# Patient Record
Sex: Male | Born: 1935
Health system: Southern US, Community
[De-identification: ages and names within clinical notes are randomized; demographics above are authoritative.]

## PROBLEM LIST (undated history)

## (undated) DIAGNOSIS — N400 Enlarged prostate without lower urinary tract symptoms: Secondary | ICD-10-CM

## (undated) DIAGNOSIS — I700 Atherosclerosis of aorta: Secondary | ICD-10-CM

## (undated) DIAGNOSIS — F329 Major depressive disorder, single episode, unspecified: Secondary | ICD-10-CM

## (undated) DIAGNOSIS — F411 Generalized anxiety disorder: Secondary | ICD-10-CM

## (undated) DIAGNOSIS — F32A Depression, unspecified: Secondary | ICD-10-CM

## (undated) DIAGNOSIS — J479 Bronchiectasis, uncomplicated: Secondary | ICD-10-CM

## (undated) HISTORY — PX: APPENDECTOMY: SHX54

## (undated) HISTORY — DX: Atherosclerosis of aorta: I70.0

## (undated) HISTORY — DX: Bronchiectasis, uncomplicated: J47.9

## (undated) HISTORY — DX: Generalized anxiety disorder: F41.1

## (undated) HISTORY — DX: Benign prostatic hyperplasia without lower urinary tract symptoms: N40.0

## (undated) HISTORY — PX: HERNIA REPAIR: SHX51

---

## 2009-11-22 ENCOUNTER — Encounter: Admission: RE | Admit: 2009-11-22 | Discharge: 2009-11-22 | Payer: Self-pay | Admitting: Internal Medicine

## 2011-07-16 DIAGNOSIS — F339 Major depressive disorder, recurrent, unspecified: Secondary | ICD-10-CM | POA: Diagnosis not present

## 2011-08-15 DIAGNOSIS — F339 Major depressive disorder, recurrent, unspecified: Secondary | ICD-10-CM | POA: Diagnosis not present

## 2011-08-28 DIAGNOSIS — F339 Major depressive disorder, recurrent, unspecified: Secondary | ICD-10-CM | POA: Diagnosis not present

## 2011-09-13 DIAGNOSIS — F339 Major depressive disorder, recurrent, unspecified: Secondary | ICD-10-CM | POA: Diagnosis not present

## 2011-10-04 DIAGNOSIS — M999 Biomechanical lesion, unspecified: Secondary | ICD-10-CM | POA: Diagnosis not present

## 2011-10-04 DIAGNOSIS — M5137 Other intervertebral disc degeneration, lumbosacral region: Secondary | ICD-10-CM | POA: Diagnosis not present

## 2011-10-08 DIAGNOSIS — M999 Biomechanical lesion, unspecified: Secondary | ICD-10-CM | POA: Diagnosis not present

## 2011-10-08 DIAGNOSIS — M5137 Other intervertebral disc degeneration, lumbosacral region: Secondary | ICD-10-CM | POA: Diagnosis not present

## 2011-10-10 DIAGNOSIS — F339 Major depressive disorder, recurrent, unspecified: Secondary | ICD-10-CM | POA: Diagnosis not present

## 2011-10-11 DIAGNOSIS — M999 Biomechanical lesion, unspecified: Secondary | ICD-10-CM | POA: Diagnosis not present

## 2011-10-11 DIAGNOSIS — M5137 Other intervertebral disc degeneration, lumbosacral region: Secondary | ICD-10-CM | POA: Diagnosis not present

## 2011-10-15 DIAGNOSIS — M5137 Other intervertebral disc degeneration, lumbosacral region: Secondary | ICD-10-CM | POA: Diagnosis not present

## 2011-10-15 DIAGNOSIS — M999 Biomechanical lesion, unspecified: Secondary | ICD-10-CM | POA: Diagnosis not present

## 2011-10-18 DIAGNOSIS — M999 Biomechanical lesion, unspecified: Secondary | ICD-10-CM | POA: Diagnosis not present

## 2011-10-18 DIAGNOSIS — M5137 Other intervertebral disc degeneration, lumbosacral region: Secondary | ICD-10-CM | POA: Diagnosis not present

## 2011-10-19 DIAGNOSIS — Z125 Encounter for screening for malignant neoplasm of prostate: Secondary | ICD-10-CM | POA: Diagnosis not present

## 2011-10-19 DIAGNOSIS — M810 Age-related osteoporosis without current pathological fracture: Secondary | ICD-10-CM | POA: Diagnosis not present

## 2011-10-19 DIAGNOSIS — Z79899 Other long term (current) drug therapy: Secondary | ICD-10-CM | POA: Diagnosis not present

## 2011-10-22 DIAGNOSIS — M5137 Other intervertebral disc degeneration, lumbosacral region: Secondary | ICD-10-CM | POA: Diagnosis not present

## 2011-10-22 DIAGNOSIS — M999 Biomechanical lesion, unspecified: Secondary | ICD-10-CM | POA: Diagnosis not present

## 2011-10-25 DIAGNOSIS — M999 Biomechanical lesion, unspecified: Secondary | ICD-10-CM | POA: Diagnosis not present

## 2011-10-25 DIAGNOSIS — M5137 Other intervertebral disc degeneration, lumbosacral region: Secondary | ICD-10-CM | POA: Diagnosis not present

## 2011-10-26 DIAGNOSIS — Z79899 Other long term (current) drug therapy: Secondary | ICD-10-CM | POA: Diagnosis not present

## 2011-10-26 DIAGNOSIS — M810 Age-related osteoporosis without current pathological fracture: Secondary | ICD-10-CM | POA: Diagnosis not present

## 2011-10-26 DIAGNOSIS — R21 Rash and other nonspecific skin eruption: Secondary | ICD-10-CM | POA: Diagnosis not present

## 2011-10-26 DIAGNOSIS — E785 Hyperlipidemia, unspecified: Secondary | ICD-10-CM | POA: Diagnosis not present

## 2011-10-30 DIAGNOSIS — M999 Biomechanical lesion, unspecified: Secondary | ICD-10-CM | POA: Diagnosis not present

## 2011-10-30 DIAGNOSIS — M5137 Other intervertebral disc degeneration, lumbosacral region: Secondary | ICD-10-CM | POA: Diagnosis not present

## 2011-11-01 DIAGNOSIS — IMO0002 Reserved for concepts with insufficient information to code with codable children: Secondary | ICD-10-CM | POA: Diagnosis not present

## 2011-11-05 DIAGNOSIS — R972 Elevated prostate specific antigen [PSA]: Secondary | ICD-10-CM | POA: Diagnosis not present

## 2011-11-07 DIAGNOSIS — M999 Biomechanical lesion, unspecified: Secondary | ICD-10-CM | POA: Diagnosis not present

## 2011-11-07 DIAGNOSIS — M5137 Other intervertebral disc degeneration, lumbosacral region: Secondary | ICD-10-CM | POA: Diagnosis not present

## 2011-11-12 DIAGNOSIS — M5137 Other intervertebral disc degeneration, lumbosacral region: Secondary | ICD-10-CM | POA: Diagnosis not present

## 2011-11-12 DIAGNOSIS — M999 Biomechanical lesion, unspecified: Secondary | ICD-10-CM | POA: Diagnosis not present

## 2011-11-13 DIAGNOSIS — F339 Major depressive disorder, recurrent, unspecified: Secondary | ICD-10-CM | POA: Diagnosis not present

## 2011-11-19 DIAGNOSIS — M999 Biomechanical lesion, unspecified: Secondary | ICD-10-CM | POA: Diagnosis not present

## 2011-11-19 DIAGNOSIS — M5137 Other intervertebral disc degeneration, lumbosacral region: Secondary | ICD-10-CM | POA: Diagnosis not present

## 2011-11-23 DIAGNOSIS — M810 Age-related osteoporosis without current pathological fracture: Secondary | ICD-10-CM | POA: Diagnosis not present

## 2011-12-11 DIAGNOSIS — F339 Major depressive disorder, recurrent, unspecified: Secondary | ICD-10-CM | POA: Diagnosis not present

## 2011-12-19 DIAGNOSIS — M999 Biomechanical lesion, unspecified: Secondary | ICD-10-CM | POA: Diagnosis not present

## 2011-12-19 DIAGNOSIS — M5137 Other intervertebral disc degeneration, lumbosacral region: Secondary | ICD-10-CM | POA: Diagnosis not present

## 2012-01-16 DIAGNOSIS — F339 Major depressive disorder, recurrent, unspecified: Secondary | ICD-10-CM | POA: Diagnosis not present

## 2012-01-16 DIAGNOSIS — M999 Biomechanical lesion, unspecified: Secondary | ICD-10-CM | POA: Diagnosis not present

## 2012-01-16 DIAGNOSIS — M5137 Other intervertebral disc degeneration, lumbosacral region: Secondary | ICD-10-CM | POA: Diagnosis not present

## 2012-01-23 DIAGNOSIS — M5137 Other intervertebral disc degeneration, lumbosacral region: Secondary | ICD-10-CM | POA: Diagnosis not present

## 2012-01-23 DIAGNOSIS — M999 Biomechanical lesion, unspecified: Secondary | ICD-10-CM | POA: Diagnosis not present

## 2012-02-11 DIAGNOSIS — M999 Biomechanical lesion, unspecified: Secondary | ICD-10-CM | POA: Diagnosis not present

## 2012-02-11 DIAGNOSIS — M5137 Other intervertebral disc degeneration, lumbosacral region: Secondary | ICD-10-CM | POA: Diagnosis not present

## 2012-02-13 DIAGNOSIS — F339 Major depressive disorder, recurrent, unspecified: Secondary | ICD-10-CM | POA: Diagnosis not present

## 2012-03-12 DIAGNOSIS — M5137 Other intervertebral disc degeneration, lumbosacral region: Secondary | ICD-10-CM | POA: Diagnosis not present

## 2012-03-12 DIAGNOSIS — M999 Biomechanical lesion, unspecified: Secondary | ICD-10-CM | POA: Diagnosis not present

## 2012-03-26 DIAGNOSIS — F339 Major depressive disorder, recurrent, unspecified: Secondary | ICD-10-CM | POA: Diagnosis not present

## 2012-04-09 DIAGNOSIS — M999 Biomechanical lesion, unspecified: Secondary | ICD-10-CM | POA: Diagnosis not present

## 2012-04-09 DIAGNOSIS — M5137 Other intervertebral disc degeneration, lumbosacral region: Secondary | ICD-10-CM | POA: Diagnosis not present

## 2012-04-25 DIAGNOSIS — F339 Major depressive disorder, recurrent, unspecified: Secondary | ICD-10-CM | POA: Diagnosis not present

## 2012-05-07 DIAGNOSIS — M999 Biomechanical lesion, unspecified: Secondary | ICD-10-CM | POA: Diagnosis not present

## 2012-05-07 DIAGNOSIS — M5137 Other intervertebral disc degeneration, lumbosacral region: Secondary | ICD-10-CM | POA: Diagnosis not present

## 2012-06-02 DIAGNOSIS — F339 Major depressive disorder, recurrent, unspecified: Secondary | ICD-10-CM | POA: Diagnosis not present

## 2012-06-02 DIAGNOSIS — M999 Biomechanical lesion, unspecified: Secondary | ICD-10-CM | POA: Diagnosis not present

## 2012-06-02 DIAGNOSIS — M5137 Other intervertebral disc degeneration, lumbosacral region: Secondary | ICD-10-CM | POA: Diagnosis not present

## 2012-07-04 DIAGNOSIS — F339 Major depressive disorder, recurrent, unspecified: Secondary | ICD-10-CM | POA: Diagnosis not present

## 2012-07-30 DIAGNOSIS — F339 Major depressive disorder, recurrent, unspecified: Secondary | ICD-10-CM | POA: Diagnosis not present

## 2012-08-01 DIAGNOSIS — F339 Major depressive disorder, recurrent, unspecified: Secondary | ICD-10-CM | POA: Diagnosis not present

## 2012-08-04 DIAGNOSIS — H3560 Retinal hemorrhage, unspecified eye: Secondary | ICD-10-CM | POA: Diagnosis not present

## 2012-09-05 DIAGNOSIS — F339 Major depressive disorder, recurrent, unspecified: Secondary | ICD-10-CM | POA: Diagnosis not present

## 2012-10-10 DIAGNOSIS — F339 Major depressive disorder, recurrent, unspecified: Secondary | ICD-10-CM | POA: Diagnosis not present

## 2012-10-28 DIAGNOSIS — E785 Hyperlipidemia, unspecified: Secondary | ICD-10-CM | POA: Diagnosis not present

## 2012-10-28 DIAGNOSIS — Z125 Encounter for screening for malignant neoplasm of prostate: Secondary | ICD-10-CM | POA: Diagnosis not present

## 2012-10-28 DIAGNOSIS — M810 Age-related osteoporosis without current pathological fracture: Secondary | ICD-10-CM | POA: Diagnosis not present

## 2012-11-01 DIAGNOSIS — IMO0002 Reserved for concepts with insufficient information to code with codable children: Secondary | ICD-10-CM | POA: Diagnosis not present

## 2012-11-03 DIAGNOSIS — R972 Elevated prostate specific antigen [PSA]: Secondary | ICD-10-CM | POA: Diagnosis not present

## 2012-11-03 DIAGNOSIS — N401 Enlarged prostate with lower urinary tract symptoms: Secondary | ICD-10-CM | POA: Diagnosis not present

## 2012-11-14 DIAGNOSIS — F339 Major depressive disorder, recurrent, unspecified: Secondary | ICD-10-CM | POA: Diagnosis not present

## 2012-11-20 DIAGNOSIS — H6120 Impacted cerumen, unspecified ear: Secondary | ICD-10-CM | POA: Diagnosis not present

## 2012-11-20 DIAGNOSIS — F329 Major depressive disorder, single episode, unspecified: Secondary | ICD-10-CM | POA: Diagnosis not present

## 2012-11-20 DIAGNOSIS — M810 Age-related osteoporosis without current pathological fracture: Secondary | ICD-10-CM | POA: Diagnosis not present

## 2012-11-20 DIAGNOSIS — R972 Elevated prostate specific antigen [PSA]: Secondary | ICD-10-CM | POA: Diagnosis not present

## 2012-12-19 DIAGNOSIS — F339 Major depressive disorder, recurrent, unspecified: Secondary | ICD-10-CM | POA: Diagnosis not present

## 2013-01-23 DIAGNOSIS — F339 Major depressive disorder, recurrent, unspecified: Secondary | ICD-10-CM | POA: Diagnosis not present

## 2013-02-18 DIAGNOSIS — F339 Major depressive disorder, recurrent, unspecified: Secondary | ICD-10-CM | POA: Diagnosis not present

## 2013-02-27 DIAGNOSIS — F339 Major depressive disorder, recurrent, unspecified: Secondary | ICD-10-CM | POA: Diagnosis not present

## 2013-04-03 DIAGNOSIS — F339 Major depressive disorder, recurrent, unspecified: Secondary | ICD-10-CM | POA: Diagnosis not present

## 2013-05-08 DIAGNOSIS — F339 Major depressive disorder, recurrent, unspecified: Secondary | ICD-10-CM | POA: Diagnosis not present

## 2013-05-13 ENCOUNTER — Encounter (HOSPITAL_COMMUNITY): Payer: Self-pay | Admitting: Emergency Medicine

## 2013-05-13 ENCOUNTER — Emergency Department (HOSPITAL_COMMUNITY)
Admission: EM | Admit: 2013-05-13 | Discharge: 2013-05-13 | Disposition: A | Discharge status: Home / Self Care | Payer: Medicare Other | Attending: Emergency Medicine | Admitting: Emergency Medicine

## 2013-05-13 ENCOUNTER — Emergency Department (HOSPITAL_COMMUNITY): Payer: Medicare Other

## 2013-05-13 DIAGNOSIS — Z8659 Personal history of other mental and behavioral disorders: Secondary | ICD-10-CM | POA: Diagnosis not present

## 2013-05-13 DIAGNOSIS — R0789 Other chest pain: Secondary | ICD-10-CM | POA: Diagnosis not present

## 2013-05-13 DIAGNOSIS — R911 Solitary pulmonary nodule: Secondary | ICD-10-CM | POA: Diagnosis not present

## 2013-05-13 DIAGNOSIS — R0602 Shortness of breath: Secondary | ICD-10-CM | POA: Diagnosis not present

## 2013-05-13 DIAGNOSIS — F339 Major depressive disorder, recurrent, unspecified: Secondary | ICD-10-CM | POA: Diagnosis not present

## 2013-05-13 HISTORY — DX: Major depressive disorder, single episode, unspecified: F32.9

## 2013-05-13 HISTORY — DX: Depression, unspecified: F32.A

## 2013-05-13 LAB — BASIC METABOLIC PANEL
BUN: 10 mg/dL (ref 6–23)
CO2: 29 mEq/L (ref 19–32)
Chloride: 102 mEq/L (ref 96–112)
GFR calc non Af Amer: 80 mL/min — ABNORMAL LOW (ref >90)
Glucose, Bld: 99 mg/dL (ref 70–99)
Potassium: 3.9 mEq/L (ref 3.5–5.1)
Sodium: 138 mEq/L (ref 135–145)

## 2013-05-13 LAB — CBC
HCT: 36.4 % — ABNORMAL LOW (ref 39.0–52.0)
MCHC: 34.1 g/dL (ref 30.0–36.0)
Platelets: 232 10*3/uL (ref 150–400)
RBC: 3.9 MIL/uL — ABNORMAL LOW (ref 4.22–5.81)
RDW: 12.6 % (ref 11.5–15.5)

## 2013-05-13 LAB — D-DIMER, QUANTITATIVE: D-Dimer, Quant: 0.27 ug/mL-FEU (ref 0.00–0.48)

## 2013-05-13 LAB — POCT I-STAT TROPONIN I: Troponin i, poc: 0.03 ng/mL (ref 0.00–0.08)

## 2013-05-13 MED ORDER — SODIUM CHLORIDE 0.9 % IV BOLUS (SEPSIS): 1000.0000 mL | Freq: Once | INTRAVENOUS | Status: DC

## 2013-05-13 MED ORDER — NITROGLYCERIN 0.4 MG SL SUBL
0.4000 mg | SUBLINGUAL_TABLET | SUBLINGUAL | Status: DC | PRN
  Administered 2013-05-13: 0.4 mg via SUBLINGUAL
  Filled 2013-05-13: qty 25

## 2013-05-13 MED ORDER — ASPIRIN 81 MG PO CHEW: 324.0000 mg | CHEWABLE_TABLET | Freq: Once | ORAL | Status: DC

## 2013-05-13 NOTE — ED Notes (Signed)
ASA not given per patient states that it bothers his stomach

## 2013-05-13 NOTE — ED Notes (Signed)
Presents with 2-3 weeks of intermittent bouts of SOB associated with chest tightness. Denies nausea, vomiting, dizziness and weakness. Chest tightness rated 1/10.

## 2013-05-13 NOTE — ED Provider Notes (Signed)
CSN: [id number]     Arrival date & time 05/13/13  1623 History   First MD Initiated Contact with Patient 05/13/13 1633     Chief Complaint  Patient presents with  . Chest Pain   (Consider location/radiation/quality/duration/timing/severity/associated sxs/prior Treatment) HPI Comments: 77 year old male presents with 2 weeks of intermittent shortness of breath and chest tightness. He states that there is nothing that seems to make it come and go. He notices it more at rest but he does with exertion. He states it is 145 pushups today and noticed a little bit but not to be worse with more activity. He normally runs as well and states he has not had any decreased exercise tolerance. He never had symptoms like this before. He followed with his PCP who stated that he hasn't irregular pulse on exam and they were concerned that he might "get a blood clot". He denies having any known DVTs or PEs as stated that 10 years ago he did have to give himself shots for swelling in his ankle on the right side. Is not sure of this was temporary Lovenox or whether or not he had a blood clot.   Past Medical History  Diagnosis Date  . Depression    History reviewed. No pertinent past surgical history. History reviewed. No pertinent family history. History  Substance Use Topics  . Smoking status: Never Smoker   . Smokeless tobacco: Never Used  . Alcohol Use: No    Review of Systems  Constitutional: Negative for fever and fatigue.  Respiratory: Positive for shortness of breath. Negative for cough.   Cardiovascular: Positive for chest pain. Negative for leg swelling.  Gastrointestinal: Negative for nausea, vomiting and abdominal pain.  Musculoskeletal: Negative for back pain.  Neurological: Negative for weakness.  All other systems reviewed and are negative.    Allergies  Sulfa antibiotics and Theophyllines  Home Medications  No current outpatient prescriptions on file. BP 128/62  Pulse 42  Temp(Src)  97.5 F (36.4 C) (Oral)  Resp 16  Wt 138 lb 4.8 oz (62.732 kg)  SpO2 99% Physical Exam  Nursing note and vitals reviewed. Constitutional: He is oriented to person, place, and time. He appears well-developed and well-nourished.  HENT:  Head: Normocephalic and atraumatic.  Right Ear: External ear normal.  Left Ear: External ear normal.  Nose: Nose normal.  Eyes: Right eye exhibits no discharge. Left eye exhibits no discharge.  Neck: Neck supple.  Cardiovascular: Normal rate, regular rhythm, normal heart sounds and intact distal pulses.   Pulmonary/Chest: Effort normal and breath sounds normal.  Abdominal: Soft. There is no tenderness.  Musculoskeletal: He exhibits no edema and no tenderness.  Neurological: He is alert and oriented to person, place, and time.  Skin: Skin is warm and dry.    ED Course  Procedures (including critical care time) Labs Review Labs Reviewed  CBC - Abnormal; Notable for the following:    RBC 3.90 (*)    Hemoglobin 12.4 (*)    HCT 36.4 (*)    All other components within normal limits  BASIC METABOLIC PANEL - Abnormal; Notable for the following:    GFR calc non Af Amer 80 (*)    All other components within normal limits  D-DIMER, QUANTITATIVE  POCT I-STAT TROPONIN I   Imaging Review Dg Chest 2 View  05/13/2013   CLINICAL DATA:  Irregular pulse. Mid chest tightness. Shortness of breath.  EXAM: CHEST  2 VIEW  COMPARISON:  CT 11/22/2009, chest x-ray 10/06/2009  FINDINGS: The lungs are hyperinflated. There are no focal consolidations or pleural effusions. No pulmonary edema. Heart size is normal. At the right lung base, there is question of a pulmonary nodule. Further evaluation with chest CT is recommended. Mild mid thoracic degenerative changes are noted.  IMPRESSION: 1. Question of right lower lobe pulmonary nodule. Chest CT is recommended. Intravenous contrast is recommended unless contraindicated. 2. No evidence for pulmonary edema. 3. These results  will be called to the ordering clinician or representative by the Radiologist Assistant, and communication documented in the PACS Dashboard.   Electronically Signed   By: Rosalie Gums M.D.   On: 05/13/2013 18:12    EKG Interpretation    Date/Time:  Wednesday May 13 2013 19:14:78 EST Ventricular Rate:  87 PR Interval:  140 QRS Duration: 78 QT Interval:  384 QTC Calculation: 462 R Axis:   79 Text Interpretation:  Sinus rhythm with Premature supraventricular complexes and with frequent Premature ventricular complexes Otherwise normal ECG No acute ischemia No old tracing to compare Confirmed by Saundra Gin  MD, Yesena Reaves ([id number]) on 05/13/2013 4:33:44 PM            MDM   1. Atypical chest pain   2. Shortness of breath   3. Lung nodule    Patient is low risk for PE, and with neg ddimer feel this is ruled out. His story is atypical for ACS, esp given that he exercises as much as he does w/o having dyspnea or the chest tightness. However, due to his age and unexplained dyspnea I recommended he be brought in for obs ACS r/o and possible stress test. After discussing with his wife patient declines, stating he will instead f/u with his PCP. I discussed that by leaving he is putting himself at a small risk of MI, disability or death, and he understands. Has decision making capacity. I also offered to do a CT for his lung nodule but patient declines, saying he will do it as an outpatient.     Audree Camel, MD 05/14/13 [phone]

## 2013-05-18 ENCOUNTER — Telehealth (HOSPITAL_COMMUNITY): Payer: Self-pay | Admitting: *Deleted

## 2013-05-20 ENCOUNTER — Other Ambulatory Visit: Payer: Self-pay | Admitting: Internal Medicine

## 2013-05-20 ENCOUNTER — Other Ambulatory Visit (HOSPITAL_COMMUNITY): Payer: Self-pay | Admitting: Internal Medicine

## 2013-05-20 DIAGNOSIS — R0602 Shortness of breath: Secondary | ICD-10-CM

## 2013-05-20 DIAGNOSIS — R9389 Abnormal findings on diagnostic imaging of other specified body structures: Secondary | ICD-10-CM

## 2013-06-01 ENCOUNTER — Ambulatory Visit
Admission: RE | Admit: 2013-06-01 | Discharge: 2013-06-01 | Disposition: A | Discharge status: Home / Self Care | Payer: Medicare Other | Source: Ambulatory Visit | Attending: Internal Medicine | Admitting: Internal Medicine

## 2013-06-01 DIAGNOSIS — J479 Bronchiectasis, uncomplicated: Secondary | ICD-10-CM | POA: Diagnosis not present

## 2013-06-01 DIAGNOSIS — R9389 Abnormal findings on diagnostic imaging of other specified body structures: Secondary | ICD-10-CM

## 2013-06-01 MED ORDER — IOHEXOL 300 MG/ML  SOLN
75.0000 mL | Freq: Once | INTRAMUSCULAR | Status: AC | PRN
  Administered 2013-06-01: 75 mL via INTRAVENOUS

## 2013-06-03 ENCOUNTER — Ambulatory Visit (HOSPITAL_COMMUNITY)
Admission: RE | Admit: 2013-06-03 | Discharge: 2013-06-03 | Disposition: A | Discharge status: Home / Self Care | Payer: Medicare Other | Source: Ambulatory Visit | Attending: Internal Medicine | Admitting: Internal Medicine

## 2013-06-03 DIAGNOSIS — R0602 Shortness of breath: Secondary | ICD-10-CM | POA: Diagnosis not present

## 2013-06-10 DIAGNOSIS — F339 Major depressive disorder, recurrent, unspecified: Secondary | ICD-10-CM | POA: Diagnosis not present

## 2013-06-12 DIAGNOSIS — F339 Major depressive disorder, recurrent, unspecified: Secondary | ICD-10-CM | POA: Diagnosis not present

## 2013-07-22 DIAGNOSIS — F339 Major depressive disorder, recurrent, unspecified: Secondary | ICD-10-CM | POA: Diagnosis not present

## 2013-08-28 DIAGNOSIS — F339 Major depressive disorder, recurrent, unspecified: Secondary | ICD-10-CM | POA: Diagnosis not present

## 2013-09-17 DIAGNOSIS — F339 Major depressive disorder, recurrent, unspecified: Secondary | ICD-10-CM | POA: Diagnosis not present

## 2013-10-02 DIAGNOSIS — F339 Major depressive disorder, recurrent, unspecified: Secondary | ICD-10-CM | POA: Diagnosis not present

## 2013-11-06 DIAGNOSIS — F339 Major depressive disorder, recurrent, unspecified: Secondary | ICD-10-CM | POA: Diagnosis not present

## 2013-11-19 DIAGNOSIS — Z125 Encounter for screening for malignant neoplasm of prostate: Secondary | ICD-10-CM | POA: Diagnosis not present

## 2013-11-19 DIAGNOSIS — E785 Hyperlipidemia, unspecified: Secondary | ICD-10-CM | POA: Diagnosis not present

## 2013-11-19 DIAGNOSIS — Z1331 Encounter for screening for depression: Secondary | ICD-10-CM | POA: Diagnosis not present

## 2013-11-19 DIAGNOSIS — M810 Age-related osteoporosis without current pathological fracture: Secondary | ICD-10-CM | POA: Diagnosis not present

## 2013-11-19 DIAGNOSIS — Z0000 Encounter for general adult medical examination without abnormal findings: Secondary | ICD-10-CM | POA: Diagnosis not present

## 2013-11-26 DIAGNOSIS — M810 Age-related osteoporosis without current pathological fracture: Secondary | ICD-10-CM | POA: Diagnosis not present

## 2013-11-26 DIAGNOSIS — N183 Chronic kidney disease, stage 3 unspecified: Secondary | ICD-10-CM | POA: Diagnosis not present

## 2013-11-26 DIAGNOSIS — R972 Elevated prostate specific antigen [PSA]: Secondary | ICD-10-CM | POA: Diagnosis not present

## 2013-11-26 DIAGNOSIS — F329 Major depressive disorder, single episode, unspecified: Secondary | ICD-10-CM | POA: Diagnosis not present

## 2013-12-09 DIAGNOSIS — F339 Major depressive disorder, recurrent, unspecified: Secondary | ICD-10-CM | POA: Diagnosis not present

## 2014-01-02 DIAGNOSIS — IMO0002 Reserved for concepts with insufficient information to code with codable children: Secondary | ICD-10-CM | POA: Diagnosis not present

## 2014-01-06 DIAGNOSIS — N400 Enlarged prostate without lower urinary tract symptoms: Secondary | ICD-10-CM | POA: Diagnosis not present

## 2014-01-06 DIAGNOSIS — R972 Elevated prostate specific antigen [PSA]: Secondary | ICD-10-CM | POA: Diagnosis not present

## 2014-01-15 DIAGNOSIS — F339 Major depressive disorder, recurrent, unspecified: Secondary | ICD-10-CM | POA: Diagnosis not present

## 2014-02-19 DIAGNOSIS — F339 Major depressive disorder, recurrent, unspecified: Secondary | ICD-10-CM | POA: Diagnosis not present

## 2014-03-09 DIAGNOSIS — H269 Unspecified cataract: Secondary | ICD-10-CM | POA: Diagnosis not present

## 2014-03-09 DIAGNOSIS — H900 Conductive hearing loss, bilateral: Secondary | ICD-10-CM | POA: Diagnosis not present

## 2014-03-22 DIAGNOSIS — H905 Unspecified sensorineural hearing loss: Secondary | ICD-10-CM | POA: Diagnosis not present

## 2014-03-22 DIAGNOSIS — H9113 Presbycusis, bilateral: Secondary | ICD-10-CM | POA: Diagnosis not present

## 2014-03-22 DIAGNOSIS — H833X9 Noise effects on inner ear, unspecified ear: Secondary | ICD-10-CM | POA: Diagnosis not present

## 2014-03-26 DIAGNOSIS — F3340 Major depressive disorder, recurrent, in remission, unspecified: Secondary | ICD-10-CM | POA: Diagnosis not present

## 2014-03-29 DIAGNOSIS — F3340 Major depressive disorder, recurrent, in remission, unspecified: Secondary | ICD-10-CM | POA: Diagnosis not present

## 2014-04-14 DIAGNOSIS — R972 Elevated prostate specific antigen [PSA]: Secondary | ICD-10-CM | POA: Diagnosis not present

## 2014-04-14 DIAGNOSIS — N401 Enlarged prostate with lower urinary tract symptoms: Secondary | ICD-10-CM | POA: Diagnosis not present

## 2014-04-15 DIAGNOSIS — H2511 Age-related nuclear cataract, right eye: Secondary | ICD-10-CM | POA: Diagnosis not present

## 2014-04-15 DIAGNOSIS — H2513 Age-related nuclear cataract, bilateral: Secondary | ICD-10-CM | POA: Diagnosis not present

## 2014-05-07 DIAGNOSIS — F3340 Major depressive disorder, recurrent, in remission, unspecified: Secondary | ICD-10-CM | POA: Diagnosis not present

## 2014-05-12 DIAGNOSIS — R972 Elevated prostate specific antigen [PSA]: Secondary | ICD-10-CM | POA: Diagnosis not present

## 2014-05-18 DIAGNOSIS — H2511 Age-related nuclear cataract, right eye: Secondary | ICD-10-CM | POA: Diagnosis not present

## 2014-05-18 DIAGNOSIS — H269 Unspecified cataract: Secondary | ICD-10-CM | POA: Diagnosis not present

## 2014-05-24 DIAGNOSIS — H2512 Age-related nuclear cataract, left eye: Secondary | ICD-10-CM | POA: Diagnosis not present

## 2014-05-25 DIAGNOSIS — H2512 Age-related nuclear cataract, left eye: Secondary | ICD-10-CM | POA: Diagnosis not present

## 2014-05-25 DIAGNOSIS — H269 Unspecified cataract: Secondary | ICD-10-CM | POA: Diagnosis not present

## 2014-06-11 DIAGNOSIS — F3340 Major depressive disorder, recurrent, in remission, unspecified: Secondary | ICD-10-CM | POA: Diagnosis not present

## 2014-07-01 DIAGNOSIS — H43813 Vitreous degeneration, bilateral: Secondary | ICD-10-CM | POA: Diagnosis not present

## 2014-07-01 DIAGNOSIS — Z961 Presence of intraocular lens: Secondary | ICD-10-CM | POA: Diagnosis not present

## 2014-07-01 DIAGNOSIS — H01001 Unspecified blepharitis right upper eyelid: Secondary | ICD-10-CM | POA: Diagnosis not present

## 2014-07-01 DIAGNOSIS — H01004 Unspecified blepharitis left upper eyelid: Secondary | ICD-10-CM | POA: Diagnosis not present

## 2014-07-16 DIAGNOSIS — F3340 Major depressive disorder, recurrent, in remission, unspecified: Secondary | ICD-10-CM | POA: Diagnosis not present

## 2014-08-20 DIAGNOSIS — F3340 Major depressive disorder, recurrent, in remission, unspecified: Secondary | ICD-10-CM | POA: Diagnosis not present

## 2014-09-03 DIAGNOSIS — F3340 Major depressive disorder, recurrent, in remission, unspecified: Secondary | ICD-10-CM | POA: Diagnosis not present

## 2014-09-24 DIAGNOSIS — F3340 Major depressive disorder, recurrent, in remission, unspecified: Secondary | ICD-10-CM | POA: Diagnosis not present

## 2014-10-29 DIAGNOSIS — F3340 Major depressive disorder, recurrent, in remission, unspecified: Secondary | ICD-10-CM | POA: Diagnosis not present

## 2014-11-22 DIAGNOSIS — Z125 Encounter for screening for malignant neoplasm of prostate: Secondary | ICD-10-CM | POA: Diagnosis not present

## 2014-11-22 DIAGNOSIS — Z1389 Encounter for screening for other disorder: Secondary | ICD-10-CM | POA: Diagnosis not present

## 2014-11-22 DIAGNOSIS — Z23 Encounter for immunization: Secondary | ICD-10-CM | POA: Diagnosis not present

## 2014-11-22 DIAGNOSIS — N183 Chronic kidney disease, stage 3 (moderate): Secondary | ICD-10-CM | POA: Diagnosis not present

## 2014-11-22 DIAGNOSIS — M810 Age-related osteoporosis without current pathological fracture: Secondary | ICD-10-CM | POA: Diagnosis not present

## 2014-11-22 DIAGNOSIS — Z0000 Encounter for general adult medical examination without abnormal findings: Secondary | ICD-10-CM | POA: Diagnosis not present

## 2014-11-22 DIAGNOSIS — E785 Hyperlipidemia, unspecified: Secondary | ICD-10-CM | POA: Diagnosis not present

## 2014-11-29 DIAGNOSIS — F3342 Major depressive disorder, recurrent, in full remission: Secondary | ICD-10-CM | POA: Diagnosis not present

## 2014-11-29 DIAGNOSIS — M810 Age-related osteoporosis without current pathological fracture: Secondary | ICD-10-CM | POA: Diagnosis not present

## 2014-11-29 DIAGNOSIS — N183 Chronic kidney disease, stage 3 (moderate): Secondary | ICD-10-CM | POA: Diagnosis not present

## 2014-11-29 DIAGNOSIS — N400 Enlarged prostate without lower urinary tract symptoms: Secondary | ICD-10-CM | POA: Diagnosis not present

## 2014-12-07 DIAGNOSIS — N401 Enlarged prostate with lower urinary tract symptoms: Secondary | ICD-10-CM | POA: Diagnosis not present

## 2014-12-07 DIAGNOSIS — R972 Elevated prostate specific antigen [PSA]: Secondary | ICD-10-CM | POA: Diagnosis not present

## 2014-12-24 DIAGNOSIS — F3340 Major depressive disorder, recurrent, in remission, unspecified: Secondary | ICD-10-CM | POA: Diagnosis not present

## 2015-01-25 DIAGNOSIS — Z23 Encounter for immunization: Secondary | ICD-10-CM | POA: Diagnosis not present

## 2015-01-25 DIAGNOSIS — R21 Rash and other nonspecific skin eruption: Secondary | ICD-10-CM | POA: Diagnosis not present

## 2015-01-28 DIAGNOSIS — F3340 Major depressive disorder, recurrent, in remission, unspecified: Secondary | ICD-10-CM | POA: Diagnosis not present

## 2015-02-18 DIAGNOSIS — F3340 Major depressive disorder, recurrent, in remission, unspecified: Secondary | ICD-10-CM | POA: Diagnosis not present

## 2015-03-04 DIAGNOSIS — F3340 Major depressive disorder, recurrent, in remission, unspecified: Secondary | ICD-10-CM | POA: Diagnosis not present

## 2015-04-03 IMAGING — CR DG CHEST 2V
2 series · 2 of 2 positions shown · non-contrast
Comparison: CT 11/22/2009, chest x-ray 10/06/2009

CLINICAL DATA: Irregular pulse. Mid chest tightness. Shortness of
breath.

EXAM:
CHEST  2 VIEW

[w chest pa]
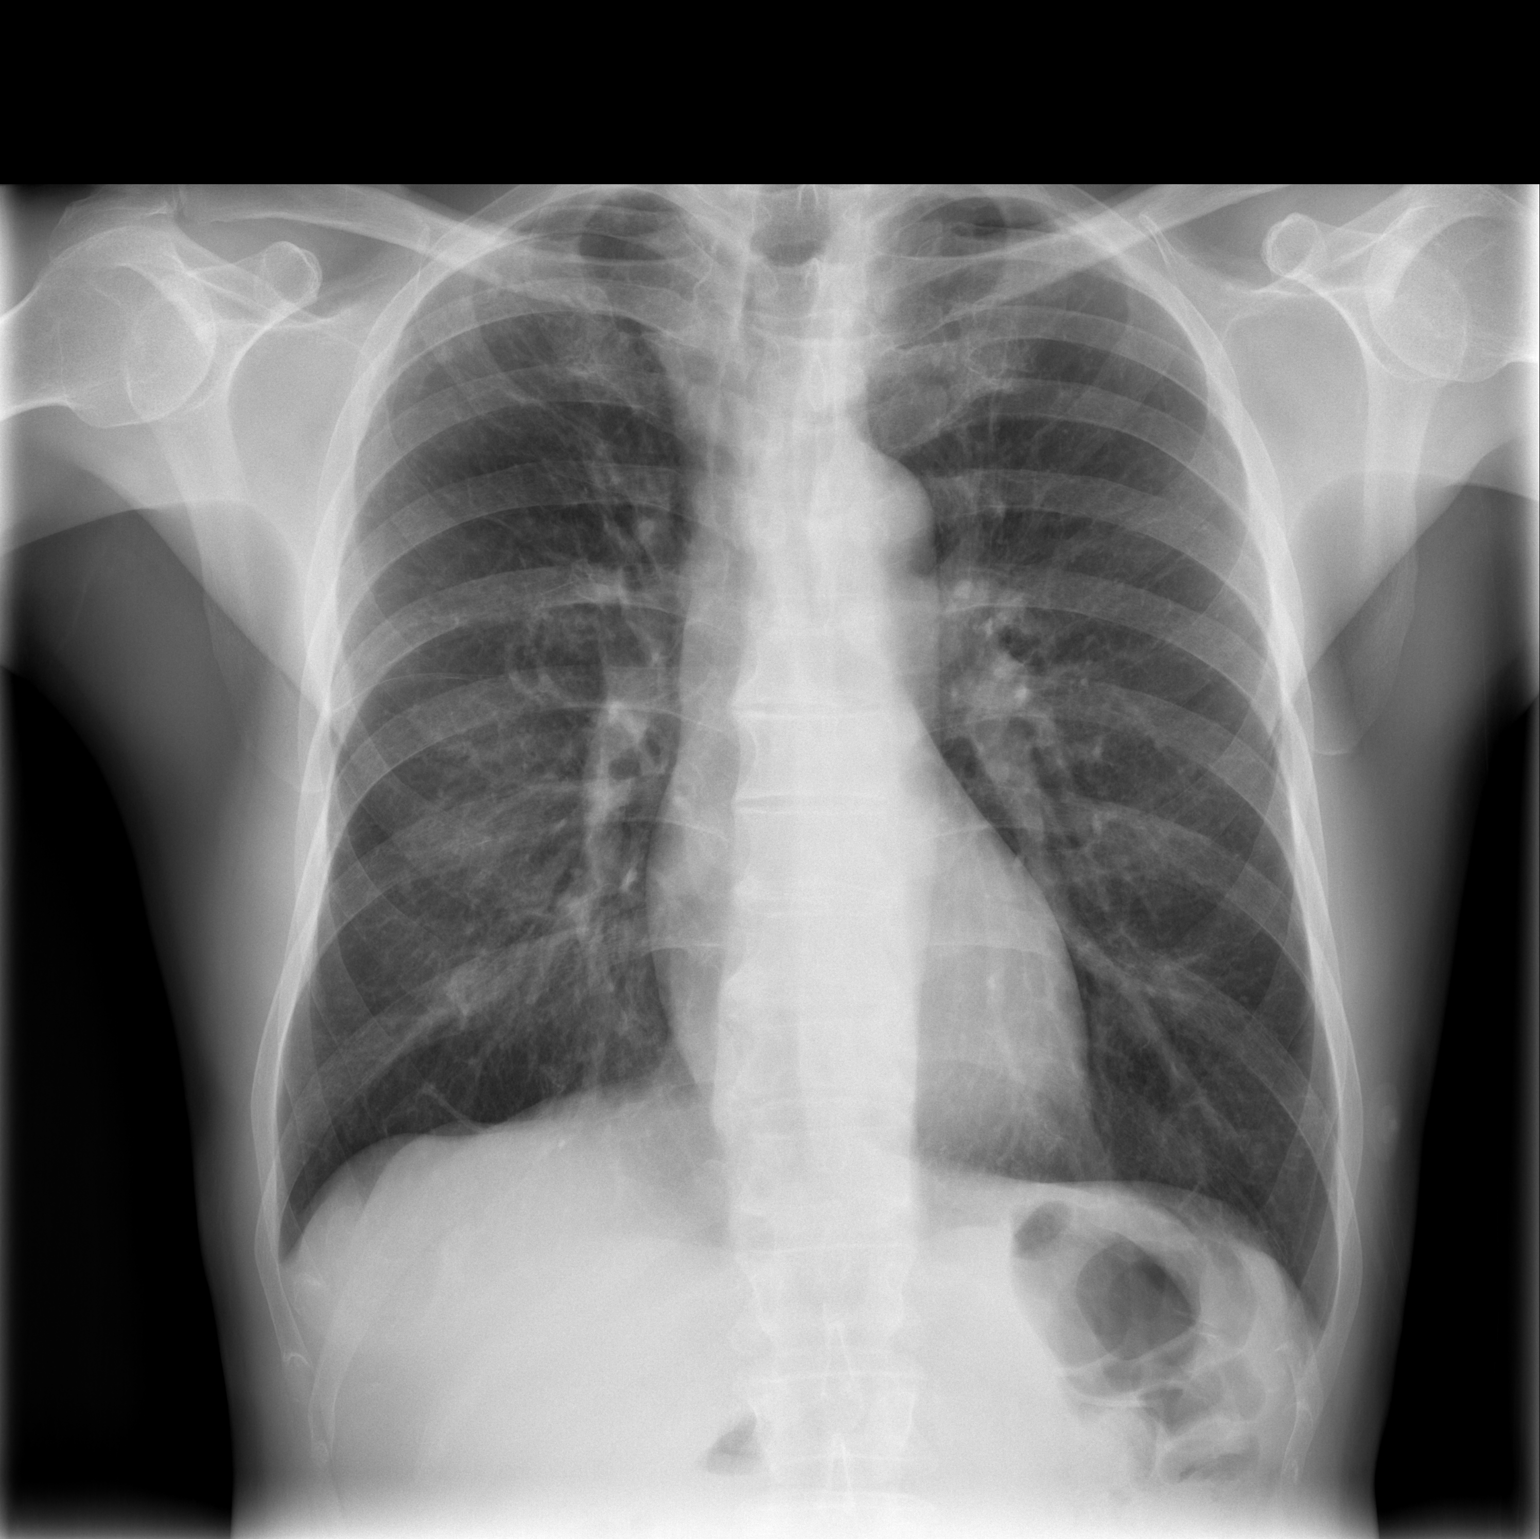

[w chest lat]
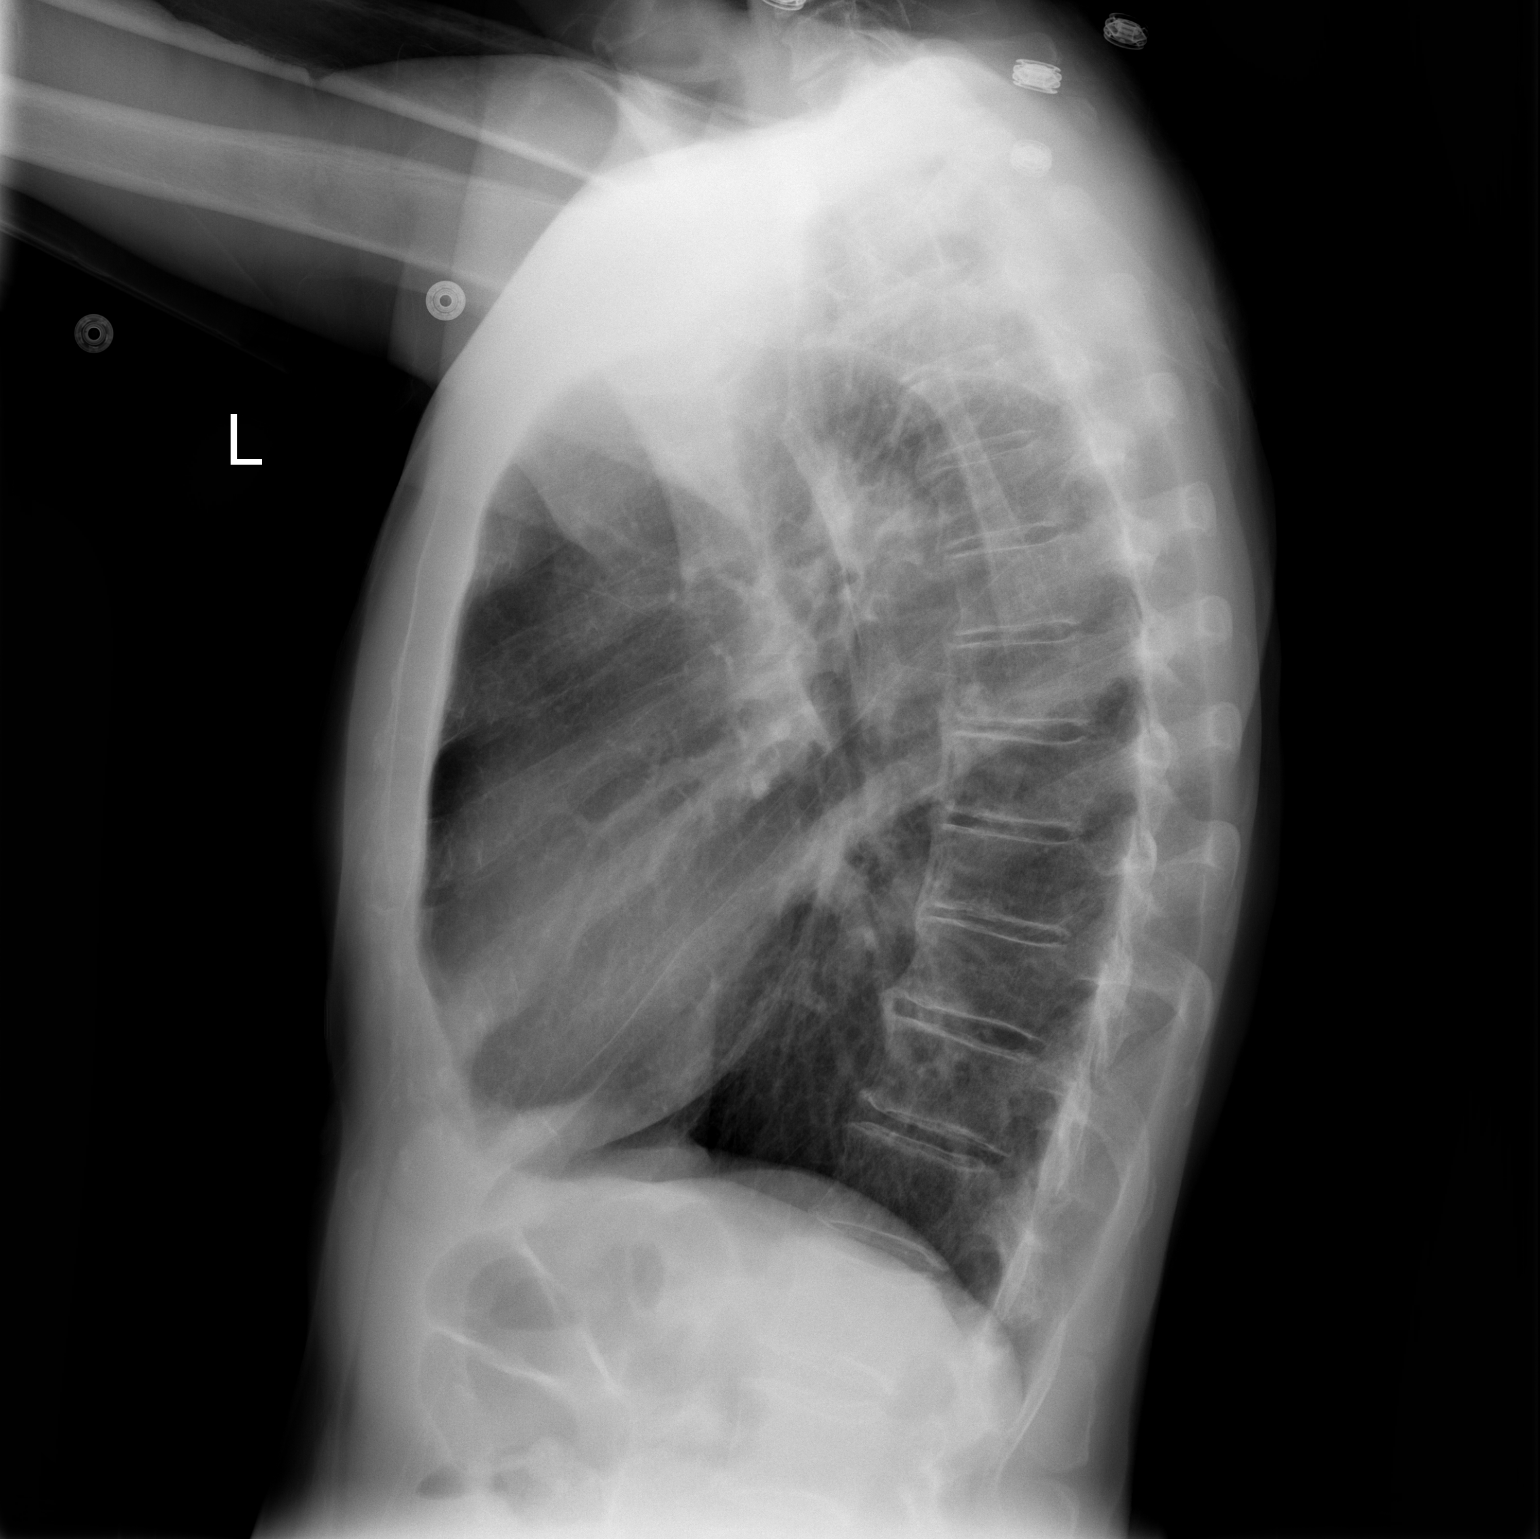

[2 of 2 positions shown; findings below may reference images not displayed]

FINDINGS: The lungs are hyperinflated. There are no focal consolidations or
pleural effusions. No pulmonary edema. Heart size is normal. At the
right lung base, there is question of a pulmonary nodule. Further
evaluation with chest CT is recommended. Mild mid thoracic
degenerative changes are noted.
IMPRESSION: 1. Question of right lower lobe pulmonary nodule. Chest CT is
recommended. Intravenous contrast is recommended unless
contraindicated.
2. No evidence for pulmonary edema.
3. These results will be called to the ordering clinician or
representative by the Radiologist Assistant, and communication
documented in the PACS Dashboard.

## 2015-04-08 DIAGNOSIS — F3340 Major depressive disorder, recurrent, in remission, unspecified: Secondary | ICD-10-CM | POA: Diagnosis not present

## 2015-05-20 DIAGNOSIS — F3340 Major depressive disorder, recurrent, in remission, unspecified: Secondary | ICD-10-CM | POA: Diagnosis not present

## 2015-06-24 DIAGNOSIS — F3340 Major depressive disorder, recurrent, in remission, unspecified: Secondary | ICD-10-CM | POA: Diagnosis not present

## 2015-07-04 DIAGNOSIS — H524 Presbyopia: Secondary | ICD-10-CM | POA: Diagnosis not present

## 2015-07-04 DIAGNOSIS — H26493 Other secondary cataract, bilateral: Secondary | ICD-10-CM | POA: Diagnosis not present

## 2015-08-05 DIAGNOSIS — F3340 Major depressive disorder, recurrent, in remission, unspecified: Secondary | ICD-10-CM | POA: Diagnosis not present

## 2015-09-09 DIAGNOSIS — F3340 Major depressive disorder, recurrent, in remission, unspecified: Secondary | ICD-10-CM | POA: Diagnosis not present

## 2015-09-19 DIAGNOSIS — M4726 Other spondylosis with radiculopathy, lumbar region: Secondary | ICD-10-CM | POA: Diagnosis not present

## 2015-09-19 DIAGNOSIS — M9903 Segmental and somatic dysfunction of lumbar region: Secondary | ICD-10-CM | POA: Diagnosis not present

## 2015-09-19 DIAGNOSIS — M9902 Segmental and somatic dysfunction of thoracic region: Secondary | ICD-10-CM | POA: Diagnosis not present

## 2015-09-19 DIAGNOSIS — M9901 Segmental and somatic dysfunction of cervical region: Secondary | ICD-10-CM | POA: Diagnosis not present

## 2015-09-20 DIAGNOSIS — M9903 Segmental and somatic dysfunction of lumbar region: Secondary | ICD-10-CM | POA: Diagnosis not present

## 2015-09-20 DIAGNOSIS — M9901 Segmental and somatic dysfunction of cervical region: Secondary | ICD-10-CM | POA: Diagnosis not present

## 2015-09-20 DIAGNOSIS — M4726 Other spondylosis with radiculopathy, lumbar region: Secondary | ICD-10-CM | POA: Diagnosis not present

## 2015-09-20 DIAGNOSIS — M9902 Segmental and somatic dysfunction of thoracic region: Secondary | ICD-10-CM | POA: Diagnosis not present

## 2015-09-21 DIAGNOSIS — M4726 Other spondylosis with radiculopathy, lumbar region: Secondary | ICD-10-CM | POA: Diagnosis not present

## 2015-09-21 DIAGNOSIS — M9901 Segmental and somatic dysfunction of cervical region: Secondary | ICD-10-CM | POA: Diagnosis not present

## 2015-09-21 DIAGNOSIS — M9903 Segmental and somatic dysfunction of lumbar region: Secondary | ICD-10-CM | POA: Diagnosis not present

## 2015-09-21 DIAGNOSIS — M9902 Segmental and somatic dysfunction of thoracic region: Secondary | ICD-10-CM | POA: Diagnosis not present

## 2015-09-26 DIAGNOSIS — M4726 Other spondylosis with radiculopathy, lumbar region: Secondary | ICD-10-CM | POA: Diagnosis not present

## 2015-09-26 DIAGNOSIS — M9902 Segmental and somatic dysfunction of thoracic region: Secondary | ICD-10-CM | POA: Diagnosis not present

## 2015-09-26 DIAGNOSIS — M9903 Segmental and somatic dysfunction of lumbar region: Secondary | ICD-10-CM | POA: Diagnosis not present

## 2015-09-26 DIAGNOSIS — M9901 Segmental and somatic dysfunction of cervical region: Secondary | ICD-10-CM | POA: Diagnosis not present

## 2015-09-29 DIAGNOSIS — M9902 Segmental and somatic dysfunction of thoracic region: Secondary | ICD-10-CM | POA: Diagnosis not present

## 2015-09-29 DIAGNOSIS — M9903 Segmental and somatic dysfunction of lumbar region: Secondary | ICD-10-CM | POA: Diagnosis not present

## 2015-09-29 DIAGNOSIS — M9901 Segmental and somatic dysfunction of cervical region: Secondary | ICD-10-CM | POA: Diagnosis not present

## 2015-09-29 DIAGNOSIS — M4726 Other spondylosis with radiculopathy, lumbar region: Secondary | ICD-10-CM | POA: Diagnosis not present

## 2015-10-03 DIAGNOSIS — M9903 Segmental and somatic dysfunction of lumbar region: Secondary | ICD-10-CM | POA: Diagnosis not present

## 2015-10-03 DIAGNOSIS — M4726 Other spondylosis with radiculopathy, lumbar region: Secondary | ICD-10-CM | POA: Diagnosis not present

## 2015-10-03 DIAGNOSIS — M9901 Segmental and somatic dysfunction of cervical region: Secondary | ICD-10-CM | POA: Diagnosis not present

## 2015-10-03 DIAGNOSIS — M9902 Segmental and somatic dysfunction of thoracic region: Secondary | ICD-10-CM | POA: Diagnosis not present

## 2015-10-06 DIAGNOSIS — M9902 Segmental and somatic dysfunction of thoracic region: Secondary | ICD-10-CM | POA: Diagnosis not present

## 2015-10-06 DIAGNOSIS — M9901 Segmental and somatic dysfunction of cervical region: Secondary | ICD-10-CM | POA: Diagnosis not present

## 2015-10-06 DIAGNOSIS — M9903 Segmental and somatic dysfunction of lumbar region: Secondary | ICD-10-CM | POA: Diagnosis not present

## 2015-10-06 DIAGNOSIS — M4726 Other spondylosis with radiculopathy, lumbar region: Secondary | ICD-10-CM | POA: Diagnosis not present

## 2015-10-07 DIAGNOSIS — F3340 Major depressive disorder, recurrent, in remission, unspecified: Secondary | ICD-10-CM | POA: Diagnosis not present

## 2015-10-10 DIAGNOSIS — M9903 Segmental and somatic dysfunction of lumbar region: Secondary | ICD-10-CM | POA: Diagnosis not present

## 2015-10-10 DIAGNOSIS — M4726 Other spondylosis with radiculopathy, lumbar region: Secondary | ICD-10-CM | POA: Diagnosis not present

## 2015-10-10 DIAGNOSIS — M9902 Segmental and somatic dysfunction of thoracic region: Secondary | ICD-10-CM | POA: Diagnosis not present

## 2015-10-10 DIAGNOSIS — M9901 Segmental and somatic dysfunction of cervical region: Secondary | ICD-10-CM | POA: Diagnosis not present

## 2015-10-13 DIAGNOSIS — M9903 Segmental and somatic dysfunction of lumbar region: Secondary | ICD-10-CM | POA: Diagnosis not present

## 2015-10-13 DIAGNOSIS — M4726 Other spondylosis with radiculopathy, lumbar region: Secondary | ICD-10-CM | POA: Diagnosis not present

## 2015-10-13 DIAGNOSIS — M9902 Segmental and somatic dysfunction of thoracic region: Secondary | ICD-10-CM | POA: Diagnosis not present

## 2015-10-13 DIAGNOSIS — M9901 Segmental and somatic dysfunction of cervical region: Secondary | ICD-10-CM | POA: Diagnosis not present

## 2015-10-17 DIAGNOSIS — M4726 Other spondylosis with radiculopathy, lumbar region: Secondary | ICD-10-CM | POA: Diagnosis not present

## 2015-10-17 DIAGNOSIS — M9903 Segmental and somatic dysfunction of lumbar region: Secondary | ICD-10-CM | POA: Diagnosis not present

## 2015-10-17 DIAGNOSIS — M9902 Segmental and somatic dysfunction of thoracic region: Secondary | ICD-10-CM | POA: Diagnosis not present

## 2015-10-17 DIAGNOSIS — M9901 Segmental and somatic dysfunction of cervical region: Secondary | ICD-10-CM | POA: Diagnosis not present

## 2015-10-20 DIAGNOSIS — M4726 Other spondylosis with radiculopathy, lumbar region: Secondary | ICD-10-CM | POA: Diagnosis not present

## 2015-10-20 DIAGNOSIS — M9901 Segmental and somatic dysfunction of cervical region: Secondary | ICD-10-CM | POA: Diagnosis not present

## 2015-10-20 DIAGNOSIS — M9903 Segmental and somatic dysfunction of lumbar region: Secondary | ICD-10-CM | POA: Diagnosis not present

## 2015-10-20 DIAGNOSIS — M9902 Segmental and somatic dysfunction of thoracic region: Secondary | ICD-10-CM | POA: Diagnosis not present

## 2015-10-21 DIAGNOSIS — F3340 Major depressive disorder, recurrent, in remission, unspecified: Secondary | ICD-10-CM | POA: Diagnosis not present

## 2015-10-25 DIAGNOSIS — Z23 Encounter for immunization: Secondary | ICD-10-CM | POA: Diagnosis not present

## 2015-10-25 DIAGNOSIS — T148 Other injury of unspecified body region: Secondary | ICD-10-CM | POA: Diagnosis not present

## 2015-10-26 DIAGNOSIS — M9901 Segmental and somatic dysfunction of cervical region: Secondary | ICD-10-CM | POA: Diagnosis not present

## 2015-10-26 DIAGNOSIS — M9902 Segmental and somatic dysfunction of thoracic region: Secondary | ICD-10-CM | POA: Diagnosis not present

## 2015-10-26 DIAGNOSIS — M4726 Other spondylosis with radiculopathy, lumbar region: Secondary | ICD-10-CM | POA: Diagnosis not present

## 2015-10-26 DIAGNOSIS — M9903 Segmental and somatic dysfunction of lumbar region: Secondary | ICD-10-CM | POA: Diagnosis not present

## 2015-11-02 DIAGNOSIS — M9901 Segmental and somatic dysfunction of cervical region: Secondary | ICD-10-CM | POA: Diagnosis not present

## 2015-11-02 DIAGNOSIS — M9903 Segmental and somatic dysfunction of lumbar region: Secondary | ICD-10-CM | POA: Diagnosis not present

## 2015-11-02 DIAGNOSIS — M9902 Segmental and somatic dysfunction of thoracic region: Secondary | ICD-10-CM | POA: Diagnosis not present

## 2015-11-02 DIAGNOSIS — M4726 Other spondylosis with radiculopathy, lumbar region: Secondary | ICD-10-CM | POA: Diagnosis not present

## 2015-11-16 DIAGNOSIS — W57XXXA Bitten or stung by nonvenomous insect and other nonvenomous arthropods, initial encounter: Secondary | ICD-10-CM | POA: Diagnosis not present

## 2015-11-16 DIAGNOSIS — T148 Other injury of unspecified body region: Secondary | ICD-10-CM | POA: Diagnosis not present

## 2015-11-28 DIAGNOSIS — Z125 Encounter for screening for malignant neoplasm of prostate: Secondary | ICD-10-CM | POA: Diagnosis not present

## 2015-11-28 DIAGNOSIS — M810 Age-related osteoporosis without current pathological fracture: Secondary | ICD-10-CM | POA: Diagnosis not present

## 2015-11-28 DIAGNOSIS — N183 Chronic kidney disease, stage 3 (moderate): Secondary | ICD-10-CM | POA: Diagnosis not present

## 2015-11-28 DIAGNOSIS — Z1389 Encounter for screening for other disorder: Secondary | ICD-10-CM | POA: Diagnosis not present

## 2015-11-28 DIAGNOSIS — Z0000 Encounter for general adult medical examination without abnormal findings: Secondary | ICD-10-CM | POA: Diagnosis not present

## 2015-11-28 DIAGNOSIS — E785 Hyperlipidemia, unspecified: Secondary | ICD-10-CM | POA: Diagnosis not present

## 2015-11-28 DIAGNOSIS — E559 Vitamin D deficiency, unspecified: Secondary | ICD-10-CM | POA: Diagnosis not present

## 2015-12-02 DIAGNOSIS — F3340 Major depressive disorder, recurrent, in remission, unspecified: Secondary | ICD-10-CM | POA: Diagnosis not present

## 2015-12-05 DIAGNOSIS — F339 Major depressive disorder, recurrent, unspecified: Secondary | ICD-10-CM | POA: Diagnosis not present

## 2015-12-05 DIAGNOSIS — M810 Age-related osteoporosis without current pathological fracture: Secondary | ICD-10-CM | POA: Diagnosis not present

## 2015-12-05 DIAGNOSIS — N183 Chronic kidney disease, stage 3 (moderate): Secondary | ICD-10-CM | POA: Diagnosis not present

## 2015-12-05 DIAGNOSIS — E785 Hyperlipidemia, unspecified: Secondary | ICD-10-CM | POA: Diagnosis not present

## 2015-12-26 DIAGNOSIS — R972 Elevated prostate specific antigen [PSA]: Secondary | ICD-10-CM | POA: Diagnosis not present

## 2015-12-26 DIAGNOSIS — N401 Enlarged prostate with lower urinary tract symptoms: Secondary | ICD-10-CM | POA: Diagnosis not present

## 2016-01-13 DIAGNOSIS — F3340 Major depressive disorder, recurrent, in remission, unspecified: Secondary | ICD-10-CM | POA: Diagnosis not present

## 2016-02-24 DIAGNOSIS — F3340 Major depressive disorder, recurrent, in remission, unspecified: Secondary | ICD-10-CM | POA: Diagnosis not present

## 2016-03-23 DIAGNOSIS — F3340 Major depressive disorder, recurrent, in remission, unspecified: Secondary | ICD-10-CM | POA: Diagnosis not present

## 2016-04-30 DIAGNOSIS — F3340 Major depressive disorder, recurrent, in remission, unspecified: Secondary | ICD-10-CM | POA: Diagnosis not present

## 2016-06-15 DIAGNOSIS — F3340 Major depressive disorder, recurrent, in remission, unspecified: Secondary | ICD-10-CM | POA: Diagnosis not present

## 2016-07-27 DIAGNOSIS — F3340 Major depressive disorder, recurrent, in remission, unspecified: Secondary | ICD-10-CM | POA: Diagnosis not present

## 2016-08-28 DIAGNOSIS — Z1211 Encounter for screening for malignant neoplasm of colon: Secondary | ICD-10-CM | POA: Diagnosis not present

## 2016-08-28 DIAGNOSIS — L989 Disorder of the skin and subcutaneous tissue, unspecified: Secondary | ICD-10-CM | POA: Diagnosis not present

## 2016-09-07 DIAGNOSIS — F3340 Major depressive disorder, recurrent, in remission, unspecified: Secondary | ICD-10-CM | POA: Diagnosis not present

## 2016-09-11 DIAGNOSIS — C44622 Squamous cell carcinoma of skin of right upper limb, including shoulder: Secondary | ICD-10-CM | POA: Diagnosis not present

## 2016-09-21 DIAGNOSIS — F3340 Major depressive disorder, recurrent, in remission, unspecified: Secondary | ICD-10-CM | POA: Diagnosis not present

## 2016-09-24 DIAGNOSIS — K921 Melena: Secondary | ICD-10-CM | POA: Diagnosis not present

## 2016-10-03 DIAGNOSIS — C44622 Squamous cell carcinoma of skin of right upper limb, including shoulder: Secondary | ICD-10-CM | POA: Diagnosis not present

## 2016-10-15 DIAGNOSIS — K648 Other hemorrhoids: Secondary | ICD-10-CM | POA: Diagnosis not present

## 2016-10-15 DIAGNOSIS — D123 Benign neoplasm of transverse colon: Secondary | ICD-10-CM | POA: Diagnosis not present

## 2016-10-15 DIAGNOSIS — K5730 Diverticulosis of large intestine without perforation or abscess without bleeding: Secondary | ICD-10-CM | POA: Diagnosis not present

## 2016-10-15 DIAGNOSIS — Z1211 Encounter for screening for malignant neoplasm of colon: Secondary | ICD-10-CM | POA: Diagnosis not present

## 2016-10-18 DIAGNOSIS — D123 Benign neoplasm of transverse colon: Secondary | ICD-10-CM | POA: Diagnosis not present

## 2016-11-16 DIAGNOSIS — H524 Presbyopia: Secondary | ICD-10-CM | POA: Diagnosis not present

## 2016-11-16 DIAGNOSIS — H26493 Other secondary cataract, bilateral: Secondary | ICD-10-CM | POA: Diagnosis not present

## 2016-12-10 DIAGNOSIS — E559 Vitamin D deficiency, unspecified: Secondary | ICD-10-CM | POA: Diagnosis not present

## 2016-12-10 DIAGNOSIS — Z125 Encounter for screening for malignant neoplasm of prostate: Secondary | ICD-10-CM | POA: Diagnosis not present

## 2016-12-10 DIAGNOSIS — M810 Age-related osteoporosis without current pathological fracture: Secondary | ICD-10-CM | POA: Diagnosis not present

## 2016-12-10 DIAGNOSIS — E785 Hyperlipidemia, unspecified: Secondary | ICD-10-CM | POA: Diagnosis not present

## 2016-12-10 DIAGNOSIS — Z0000 Encounter for general adult medical examination without abnormal findings: Secondary | ICD-10-CM | POA: Diagnosis not present

## 2016-12-14 DIAGNOSIS — F3340 Major depressive disorder, recurrent, in remission, unspecified: Secondary | ICD-10-CM | POA: Diagnosis not present

## 2016-12-17 DIAGNOSIS — N400 Enlarged prostate without lower urinary tract symptoms: Secondary | ICD-10-CM | POA: Diagnosis not present

## 2016-12-17 DIAGNOSIS — M810 Age-related osteoporosis without current pathological fracture: Secondary | ICD-10-CM | POA: Diagnosis not present

## 2016-12-17 DIAGNOSIS — M8580 Other specified disorders of bone density and structure, unspecified site: Secondary | ICD-10-CM | POA: Diagnosis not present

## 2016-12-17 DIAGNOSIS — F329 Major depressive disorder, single episode, unspecified: Secondary | ICD-10-CM | POA: Diagnosis not present

## 2016-12-17 DIAGNOSIS — R972 Elevated prostate specific antigen [PSA]: Secondary | ICD-10-CM | POA: Diagnosis not present

## 2016-12-20 DIAGNOSIS — H26491 Other secondary cataract, right eye: Secondary | ICD-10-CM | POA: Diagnosis not present

## 2017-01-03 DIAGNOSIS — H26492 Other secondary cataract, left eye: Secondary | ICD-10-CM | POA: Diagnosis not present

## 2017-01-07 DIAGNOSIS — R972 Elevated prostate specific antigen [PSA]: Secondary | ICD-10-CM | POA: Diagnosis not present

## 2017-01-07 DIAGNOSIS — N401 Enlarged prostate with lower urinary tract symptoms: Secondary | ICD-10-CM | POA: Diagnosis not present

## 2017-02-22 DIAGNOSIS — F3340 Major depressive disorder, recurrent, in remission, unspecified: Secondary | ICD-10-CM | POA: Diagnosis not present

## 2017-05-31 DIAGNOSIS — F3340 Major depressive disorder, recurrent, in remission, unspecified: Secondary | ICD-10-CM | POA: Diagnosis not present

## 2017-08-09 DIAGNOSIS — F3340 Major depressive disorder, recurrent, in remission, unspecified: Secondary | ICD-10-CM | POA: Diagnosis not present

## 2017-10-02 DIAGNOSIS — R6883 Chills (without fever): Secondary | ICD-10-CM | POA: Diagnosis not present

## 2017-10-02 DIAGNOSIS — M810 Age-related osteoporosis without current pathological fracture: Secondary | ICD-10-CM | POA: Diagnosis not present

## 2017-10-02 DIAGNOSIS — E785 Hyperlipidemia, unspecified: Secondary | ICD-10-CM | POA: Diagnosis not present

## 2017-10-02 DIAGNOSIS — J302 Other seasonal allergic rhinitis: Secondary | ICD-10-CM | POA: Diagnosis not present

## 2017-11-15 DIAGNOSIS — F3340 Major depressive disorder, recurrent, in remission, unspecified: Secondary | ICD-10-CM | POA: Diagnosis not present

## 2017-12-17 DIAGNOSIS — Z0000 Encounter for general adult medical examination without abnormal findings: Secondary | ICD-10-CM | POA: Diagnosis not present

## 2017-12-17 DIAGNOSIS — E785 Hyperlipidemia, unspecified: Secondary | ICD-10-CM | POA: Diagnosis not present

## 2017-12-17 DIAGNOSIS — N183 Chronic kidney disease, stage 3 (moderate): Secondary | ICD-10-CM | POA: Diagnosis not present

## 2017-12-17 DIAGNOSIS — Z125 Encounter for screening for malignant neoplasm of prostate: Secondary | ICD-10-CM | POA: Diagnosis not present

## 2017-12-23 DIAGNOSIS — N400 Enlarged prostate without lower urinary tract symptoms: Secondary | ICD-10-CM | POA: Diagnosis not present

## 2017-12-23 DIAGNOSIS — N183 Chronic kidney disease, stage 3 (moderate): Secondary | ICD-10-CM | POA: Diagnosis not present

## 2017-12-23 DIAGNOSIS — F339 Major depressive disorder, recurrent, unspecified: Secondary | ICD-10-CM | POA: Diagnosis not present

## 2017-12-23 DIAGNOSIS — R972 Elevated prostate specific antigen [PSA]: Secondary | ICD-10-CM | POA: Diagnosis not present

## 2018-01-20 DIAGNOSIS — H52203 Unspecified astigmatism, bilateral: Secondary | ICD-10-CM | POA: Diagnosis not present

## 2018-01-20 DIAGNOSIS — Z961 Presence of intraocular lens: Secondary | ICD-10-CM | POA: Diagnosis not present

## 2018-02-12 DIAGNOSIS — F3340 Major depressive disorder, recurrent, in remission, unspecified: Secondary | ICD-10-CM | POA: Diagnosis not present

## 2018-02-25 DIAGNOSIS — D225 Melanocytic nevi of trunk: Secondary | ICD-10-CM | POA: Diagnosis not present

## 2018-02-25 DIAGNOSIS — L570 Actinic keratosis: Secondary | ICD-10-CM | POA: Diagnosis not present

## 2018-02-25 DIAGNOSIS — L821 Other seborrheic keratosis: Secondary | ICD-10-CM | POA: Diagnosis not present

## 2018-02-25 DIAGNOSIS — X32XXXA Exposure to sunlight, initial encounter: Secondary | ICD-10-CM | POA: Diagnosis not present

## 2018-04-25 ENCOUNTER — Ambulatory Visit (INDEPENDENT_AMBULATORY_CARE_PROVIDER_SITE_OTHER): Payer: Medicare Other | Admitting: Psychiatry

## 2018-04-25 DIAGNOSIS — F3342 Major depressive disorder, recurrent, in full remission: Secondary | ICD-10-CM

## 2018-04-25 NOTE — Progress Notes (Signed)
Psychotherapy Progress Note -- Jeremy Moore, PhD, [facility]  Patient ID: Jeremy [name]     MRN: [id number]     Date: 04/25/2018     Treatment Type: Individual psychotherapy Start: 2:21p Stop: 3:05p Time Spent: 44 min Accompanied by: none  Self-Report (interim history, self-report of stressors and symptoms, application of prior therapy, status changes) Going well.  Been volunteering with [employer] for nearly a year.  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  Has enjoyed the work, about to take a break.  Son [name] from [place] making plans to visit, possibly secure a green card for his wife if he can earn a suprapoverty wage, may show up any time now.  Almost called in last month when wife informed him son [name] was hospitalized for depression, news that tensed and nauseated him.  Made use of prayer and PMR to calm tension the ensuing days.  Son has been on disability leave for Bipolar II, with stress of a book, multiple project duties at his college, and having stopped his stabilizer AMA.  Daughter in law enduring OK, supporting him.  PT's wife helpful.  [name] in good spirits, health, saw her in [place] (having travelled from [place] to see inlaws.)  PT and W both notice influence to winter depression, planned for it, meeting it with dedication to getting feet on the ground, prayer of thanks, and running early in the day.  Aware always a need to tend his attitude, and keeping up the monthly self-evaluation and self-prompt to engage his relationships.    Therapies used: Ego-Supportive and Psycho-education/Bibliotherapy  Intervention notes: Reviewed privacy considerations with patient concerning use of electronic health record (EHR) as part of [facility] system services, including how medications and appointment calendar are inevitably available to other providers, how diagnoses are by necessity included in billing, how diagnoses and identified behavioral  health problems can be shared with other providers (via the EPIC Problem List), and how medications, appointments, and Problem List items are automatically exported from one EHR system to another provided a patient has consented to services at each of the sharing systems.  Also explained the patient's option to confine diagnoses and problems identified and treated here to this practice by leaving them off the Problem List or the option to better inform other providers by including them on the Problem List.  Patient wishes for the diagnoses used and problems addressed in treatment here to remain private to [name] providers unless or until further specified.     Mental Status/Observations:     Appearance:   Casual and Well Groomed     Behavior:  Appropriate  Motor:  Normal  Speech/Language:   Clear and Coherent  Affect:  Appropriate and Full Range  Mood:  normal  Thought process:  normal  Thought content:    WNL  Sensory/Perceptual disturbances:    WNL  Orientation:  intact  Attention:  Good  Concentration:  Good  Memory:  intact  Fund of knowledge:   NA  Insight:    Good  Judgment:   Good  Impulse Control:  Good   Risk Assessment: Danger to Self:  No Self-injurious Behavior: No Danger to Others: No Duty to Warn:no Physical Aggression / Violence:No  Access to Firearms a concern: No   Diagnosis:   ICD-10-CM   1. Major depressive disorder, recurrent, in full remission with mood-congruent psychotic features ([place]) F33.42     Progress rating:  Greatly improved  Plan:  .  Continue to utilize previously learned skills ad lib . Maintain medication, if prescribed, and work faithfully with relevant prescriber(s) . Call the clinic on-call service, present to ER, or call 911 if any life-threatening emergency . Follow up with me in about 6 months, med management as scheduled  Blanchie Serve, PhD

## 2018-07-16 ENCOUNTER — Ambulatory Visit (INDEPENDENT_AMBULATORY_CARE_PROVIDER_SITE_OTHER): Payer: Medicare Other | Admitting: Psychiatry

## 2018-07-16 DIAGNOSIS — F339 Major depressive disorder, recurrent, unspecified: Secondary | ICD-10-CM

## 2018-07-16 DIAGNOSIS — Z8659 Personal history of other mental and behavioral disorders: Secondary | ICD-10-CM

## 2018-07-16 MED ORDER — DULOXETINE HCL 60 MG PO CPEP: 120.0000 mg | ORAL_CAPSULE | Freq: Every morning | ORAL | 1 refills | Status: DC

## 2018-07-16 MED ORDER — RISPERIDONE 0.25 MG PO TABS: 0.2500 mg | ORAL_TABLET | Freq: Every day | ORAL | 1 refills | Status: DC

## 2018-07-16 MED ORDER — MIRTAZAPINE 30 MG PO TABS: 30.0000 mg | ORAL_TABLET | Freq: Every day | ORAL | 1 refills | Status: DC

## 2018-07-16 NOTE — Progress Notes (Signed)
[facility]  Patient ID: Jeremy [name],  MRN: [id number]  PCP: Patient, No Pcp Per  Date of Evaluation: 07/16/2018 Time spent:20 minutes  Chief Complaint:   HISTORY/CURRENT STATUS: HPI patient last seen last September.  He was doing well.  Diagnosis of major depressive disorder and history of psychosis.  He also had some mild chin tremors he decided not to treat that at the time.  He will continue his same medications Patient has continued to do well.  To note he went to [place] to ski recently.  Individual Medical History/ Review of Systems: Changes? :No   Allergies: Aspirin; Sulfa antibiotics; and Theophyllines  Current Medications:  Current Outpatient Medications:  .  B Complex Vitamins (VITAMIN B COMPLEX) TABS, Take 1 tablet by mouth daily., Disp: , Rfl:  .  calcium-vitamin D (OSCAL WITH D) 500-200 MG-UNIT per tablet, Take 1 tablet by mouth daily., Disp: , Rfl:  .  DULoxetine (CYMBALTA) 60 MG capsule, Take 120 mg by mouth every morning., Disp: , Rfl:  .  magnesium 30 MG tablet, Take 30 mg by mouth daily., Disp: , Rfl:  .  mirtazapine (REMERON) 30 MG tablet, Take 30 mg by mouth at bedtime., Disp: , Rfl:  .  Multiple Vitamins-Minerals (MULTIVITAMIN WITH MINERALS) tablet, Take 1 tablet by mouth daily., Disp: , Rfl:  .  risperiDONE (RISPERDAL) 0.25 MG tablet, Take 0.25 mg by mouth at bedtime., Disp: , Rfl:  .  saw palmetto 160 MG capsule, Take 160 mg by mouth daily., Disp: , Rfl:  .  vitamin C (ASCORBIC ACID) 500 MG tablet, Take 1,000 mg by mouth daily., Disp: , Rfl:  .  Vitamins C E (VITAMIN C & E COMBINATION PO), Take 1 tablet by mouth daily., Disp: , Rfl:  .  ZINC SULFATE PO, Take 1 tablet by mouth daily., Disp: , Rfl:  Medication Side Effects: none  Family Medical/ Social History: Changes? No  MENTAL HEALTH EXAM:  There were no vitals taken for this visit.There is no height or weight on file to calculate BMI.  General Appearance: Casual slender  Eye Contact:   Good  Speech:  Normal Rate  Volume:  Normal  Mood:  Euthymic  Affect:  Appropriate  Thought Process:  Linear  Orientation:  Full (Time, Place, and Person)  Thought Content: Logical   Suicidal Thoughts:  No  Homicidal Thoughts:  No  Memory:  WNL  Judgement:  Good  Insight:  Good  Psychomotor Activity:  Normal  Concentration:  Concentration: Good  Recall:  Good  Fund of Knowledge: Good  Language: Good  Assets:  Desire for Improvement  ADL's:  Intact  Cognition: WNL  Prognosis:  Good    DIAGNOSES: No diagnosis found.  Receiving Psychotherapy: No    RECOMMENDATIONS: Patient will continue his same medicines which include Cymbalta 60 mg 2 a day, Remeron 30 mg at bedtime, Risperdal 0.25 mg at bedtime.  He is to return in 6 months   [name], [place]

## 2018-07-24 DIAGNOSIS — M9902 Segmental and somatic dysfunction of thoracic region: Secondary | ICD-10-CM | POA: Diagnosis not present

## 2018-07-24 DIAGNOSIS — M9901 Segmental and somatic dysfunction of cervical region: Secondary | ICD-10-CM | POA: Diagnosis not present

## 2018-07-24 DIAGNOSIS — M9903 Segmental and somatic dysfunction of lumbar region: Secondary | ICD-10-CM | POA: Diagnosis not present

## 2018-07-24 DIAGNOSIS — M47812 Spondylosis without myelopathy or radiculopathy, cervical region: Secondary | ICD-10-CM | POA: Diagnosis not present

## 2018-07-28 DIAGNOSIS — M9902 Segmental and somatic dysfunction of thoracic region: Secondary | ICD-10-CM | POA: Diagnosis not present

## 2018-07-28 DIAGNOSIS — M47812 Spondylosis without myelopathy or radiculopathy, cervical region: Secondary | ICD-10-CM | POA: Diagnosis not present

## 2018-07-28 DIAGNOSIS — M9903 Segmental and somatic dysfunction of lumbar region: Secondary | ICD-10-CM | POA: Diagnosis not present

## 2018-07-28 DIAGNOSIS — M9901 Segmental and somatic dysfunction of cervical region: Secondary | ICD-10-CM | POA: Diagnosis not present

## 2018-07-30 DIAGNOSIS — M9901 Segmental and somatic dysfunction of cervical region: Secondary | ICD-10-CM | POA: Diagnosis not present

## 2018-07-30 DIAGNOSIS — M9902 Segmental and somatic dysfunction of thoracic region: Secondary | ICD-10-CM | POA: Diagnosis not present

## 2018-07-30 DIAGNOSIS — M9903 Segmental and somatic dysfunction of lumbar region: Secondary | ICD-10-CM | POA: Diagnosis not present

## 2018-07-30 DIAGNOSIS — M47812 Spondylosis without myelopathy or radiculopathy, cervical region: Secondary | ICD-10-CM | POA: Diagnosis not present

## 2018-08-05 DIAGNOSIS — M9903 Segmental and somatic dysfunction of lumbar region: Secondary | ICD-10-CM | POA: Diagnosis not present

## 2018-08-05 DIAGNOSIS — M9902 Segmental and somatic dysfunction of thoracic region: Secondary | ICD-10-CM | POA: Diagnosis not present

## 2018-08-05 DIAGNOSIS — M47812 Spondylosis without myelopathy or radiculopathy, cervical region: Secondary | ICD-10-CM | POA: Diagnosis not present

## 2018-08-05 DIAGNOSIS — M9901 Segmental and somatic dysfunction of cervical region: Secondary | ICD-10-CM | POA: Diagnosis not present

## 2018-08-07 DIAGNOSIS — M9901 Segmental and somatic dysfunction of cervical region: Secondary | ICD-10-CM | POA: Diagnosis not present

## 2018-08-07 DIAGNOSIS — M9902 Segmental and somatic dysfunction of thoracic region: Secondary | ICD-10-CM | POA: Diagnosis not present

## 2018-08-07 DIAGNOSIS — M47812 Spondylosis without myelopathy or radiculopathy, cervical region: Secondary | ICD-10-CM | POA: Diagnosis not present

## 2018-08-07 DIAGNOSIS — M9903 Segmental and somatic dysfunction of lumbar region: Secondary | ICD-10-CM | POA: Diagnosis not present

## 2018-08-12 DIAGNOSIS — M9902 Segmental and somatic dysfunction of thoracic region: Secondary | ICD-10-CM | POA: Diagnosis not present

## 2018-08-12 DIAGNOSIS — M9901 Segmental and somatic dysfunction of cervical region: Secondary | ICD-10-CM | POA: Diagnosis not present

## 2018-08-12 DIAGNOSIS — M47812 Spondylosis without myelopathy or radiculopathy, cervical region: Secondary | ICD-10-CM | POA: Diagnosis not present

## 2018-08-12 DIAGNOSIS — M9903 Segmental and somatic dysfunction of lumbar region: Secondary | ICD-10-CM | POA: Diagnosis not present

## 2018-08-14 DIAGNOSIS — M9903 Segmental and somatic dysfunction of lumbar region: Secondary | ICD-10-CM | POA: Diagnosis not present

## 2018-08-14 DIAGNOSIS — M9902 Segmental and somatic dysfunction of thoracic region: Secondary | ICD-10-CM | POA: Diagnosis not present

## 2018-08-14 DIAGNOSIS — M47812 Spondylosis without myelopathy or radiculopathy, cervical region: Secondary | ICD-10-CM | POA: Diagnosis not present

## 2018-08-14 DIAGNOSIS — M9901 Segmental and somatic dysfunction of cervical region: Secondary | ICD-10-CM | POA: Diagnosis not present

## 2018-08-19 DIAGNOSIS — M9902 Segmental and somatic dysfunction of thoracic region: Secondary | ICD-10-CM | POA: Diagnosis not present

## 2018-08-19 DIAGNOSIS — M9901 Segmental and somatic dysfunction of cervical region: Secondary | ICD-10-CM | POA: Diagnosis not present

## 2018-08-19 DIAGNOSIS — M9903 Segmental and somatic dysfunction of lumbar region: Secondary | ICD-10-CM | POA: Diagnosis not present

## 2018-08-19 DIAGNOSIS — M47812 Spondylosis without myelopathy or radiculopathy, cervical region: Secondary | ICD-10-CM | POA: Diagnosis not present

## 2018-08-20 DIAGNOSIS — M9903 Segmental and somatic dysfunction of lumbar region: Secondary | ICD-10-CM | POA: Diagnosis not present

## 2018-08-20 DIAGNOSIS — M47812 Spondylosis without myelopathy or radiculopathy, cervical region: Secondary | ICD-10-CM | POA: Diagnosis not present

## 2018-08-20 DIAGNOSIS — M9902 Segmental and somatic dysfunction of thoracic region: Secondary | ICD-10-CM | POA: Diagnosis not present

## 2018-08-20 DIAGNOSIS — M9901 Segmental and somatic dysfunction of cervical region: Secondary | ICD-10-CM | POA: Diagnosis not present

## 2018-08-26 DIAGNOSIS — M9901 Segmental and somatic dysfunction of cervical region: Secondary | ICD-10-CM | POA: Diagnosis not present

## 2018-08-26 DIAGNOSIS — M47812 Spondylosis without myelopathy or radiculopathy, cervical region: Secondary | ICD-10-CM | POA: Diagnosis not present

## 2018-08-26 DIAGNOSIS — M9902 Segmental and somatic dysfunction of thoracic region: Secondary | ICD-10-CM | POA: Diagnosis not present

## 2018-08-26 DIAGNOSIS — M9903 Segmental and somatic dysfunction of lumbar region: Secondary | ICD-10-CM | POA: Diagnosis not present

## 2018-09-02 DIAGNOSIS — M9902 Segmental and somatic dysfunction of thoracic region: Secondary | ICD-10-CM | POA: Diagnosis not present

## 2018-09-02 DIAGNOSIS — M9901 Segmental and somatic dysfunction of cervical region: Secondary | ICD-10-CM | POA: Diagnosis not present

## 2018-09-02 DIAGNOSIS — M47812 Spondylosis without myelopathy or radiculopathy, cervical region: Secondary | ICD-10-CM | POA: Diagnosis not present

## 2018-09-02 DIAGNOSIS — M9903 Segmental and somatic dysfunction of lumbar region: Secondary | ICD-10-CM | POA: Diagnosis not present

## 2018-09-09 DIAGNOSIS — M9902 Segmental and somatic dysfunction of thoracic region: Secondary | ICD-10-CM | POA: Diagnosis not present

## 2018-09-09 DIAGNOSIS — M9901 Segmental and somatic dysfunction of cervical region: Secondary | ICD-10-CM | POA: Diagnosis not present

## 2018-09-09 DIAGNOSIS — M47812 Spondylosis without myelopathy or radiculopathy, cervical region: Secondary | ICD-10-CM | POA: Diagnosis not present

## 2018-09-09 DIAGNOSIS — M9903 Segmental and somatic dysfunction of lumbar region: Secondary | ICD-10-CM | POA: Diagnosis not present

## 2018-09-17 DIAGNOSIS — M9903 Segmental and somatic dysfunction of lumbar region: Secondary | ICD-10-CM | POA: Diagnosis not present

## 2018-09-17 DIAGNOSIS — M9901 Segmental and somatic dysfunction of cervical region: Secondary | ICD-10-CM | POA: Diagnosis not present

## 2018-09-17 DIAGNOSIS — M47812 Spondylosis without myelopathy or radiculopathy, cervical region: Secondary | ICD-10-CM | POA: Diagnosis not present

## 2018-09-17 DIAGNOSIS — M9902 Segmental and somatic dysfunction of thoracic region: Secondary | ICD-10-CM | POA: Diagnosis not present

## 2018-10-24 ENCOUNTER — Ambulatory Visit: Payer: Medicare Other | Admitting: Psychiatry

## 2018-11-21 ENCOUNTER — Other Ambulatory Visit: Payer: Self-pay

## 2018-11-21 ENCOUNTER — Ambulatory Visit (INDEPENDENT_AMBULATORY_CARE_PROVIDER_SITE_OTHER): Payer: Medicare Other | Admitting: Psychiatry

## 2018-11-21 DIAGNOSIS — Z8659 Personal history of other mental and behavioral disorders: Secondary | ICD-10-CM | POA: Diagnosis not present

## 2018-11-21 DIAGNOSIS — F3340 Major depressive disorder, recurrent, in remission, unspecified: Secondary | ICD-10-CM
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
## 2018-12-22 DIAGNOSIS — E785 Hyperlipidemia, unspecified: Secondary | ICD-10-CM | POA: Diagnosis not present

## 2018-12-22 DIAGNOSIS — N183 Chronic kidney disease, stage 3 (moderate): Secondary | ICD-10-CM | POA: Diagnosis not present

## 2018-12-22 DIAGNOSIS — R972 Elevated prostate specific antigen [PSA]: Secondary | ICD-10-CM | POA: Diagnosis not present

## 2018-12-22 DIAGNOSIS — F339 Major depressive disorder, recurrent, unspecified: Secondary | ICD-10-CM | POA: Diagnosis not present

## 2018-12-29 DIAGNOSIS — K219 Gastro-esophageal reflux disease without esophagitis: Secondary | ICD-10-CM | POA: Diagnosis not present

## 2018-12-29 DIAGNOSIS — F339 Major depressive disorder, recurrent, unspecified: Secondary | ICD-10-CM | POA: Diagnosis not present

## 2018-12-29 DIAGNOSIS — Z7189 Other specified counseling: Secondary | ICD-10-CM | POA: Diagnosis not present

## 2018-12-29 DIAGNOSIS — D649 Anemia, unspecified: Secondary | ICD-10-CM | POA: Diagnosis not present

## 2018-12-29 DIAGNOSIS — B351 Tinea unguium: Secondary | ICD-10-CM | POA: Diagnosis not present

## 2018-12-29 DIAGNOSIS — Z0000 Encounter for general adult medical examination without abnormal findings: Secondary | ICD-10-CM | POA: Diagnosis not present

## 2018-12-29 DIAGNOSIS — N400 Enlarged prostate without lower urinary tract symptoms: Secondary | ICD-10-CM | POA: Diagnosis not present

## 2018-12-29 DIAGNOSIS — R972 Elevated prostate specific antigen [PSA]: Secondary | ICD-10-CM | POA: Diagnosis not present

## 2019-01-16 ENCOUNTER — Ambulatory Visit: Payer: Medicare Other | Admitting: Psychiatry

## 2019-02-20 ENCOUNTER — Telehealth: Payer: Self-pay | Admitting: Physician Assistant

## 2019-02-20 ENCOUNTER — Other Ambulatory Visit: Payer: Self-pay

## 2019-02-20 ENCOUNTER — Ambulatory Visit: Payer: Medicare Other | Admitting: Physician Assistant

## 2019-02-20 NOTE — Telephone Encounter (Signed)
Pt called to request you send in a Rx for Risperdal for 7 days. He will be out 10/12 and appt is 10/19. He always runs short on the med. [facility]

## 2019-02-24 ENCOUNTER — Other Ambulatory Visit: Payer: Self-pay

## 2019-02-24 MED ORDER — RISPERIDONE 0.25 MG PO TABS: 0.2500 mg | ORAL_TABLET | Freq: Every day | ORAL | 0 refills | Status: DC

## 2019-02-24 NOTE — Telephone Encounter (Signed)
Refill submitted to [facility]

## 2019-03-20 DIAGNOSIS — Z23 Encounter for immunization: Secondary | ICD-10-CM | POA: Diagnosis not present

## 2019-03-23 ENCOUNTER — Other Ambulatory Visit: Payer: Self-pay | Admitting: Physician Assistant

## 2019-03-23 ENCOUNTER — Other Ambulatory Visit: Payer: Self-pay

## 2019-03-23 ENCOUNTER — Ambulatory Visit (INDEPENDENT_AMBULATORY_CARE_PROVIDER_SITE_OTHER): Payer: Medicare Other | Admitting: Physician Assistant

## 2019-03-23 ENCOUNTER — Encounter: Payer: Self-pay | Admitting: Physician Assistant

## 2019-03-23 DIAGNOSIS — F3340 Major depressive disorder, recurrent, in remission, unspecified: Secondary | ICD-10-CM | POA: Diagnosis not present

## 2019-03-23 DIAGNOSIS — Z8659 Personal history of other mental and behavioral disorders: Secondary | ICD-10-CM

## 2019-03-23 MED ORDER — RISPERIDONE 0.25 MG PO TABS: 0.2500 mg | ORAL_TABLET | Freq: Every day | ORAL | 1 refills | Status: DC

## 2019-03-23 MED ORDER — MIRTAZAPINE 30 MG PO TABS: 30.0000 mg | ORAL_TABLET | Freq: Every day | ORAL | 1 refills | Status: DC

## 2019-03-23 MED ORDER — DULOXETINE HCL 60 MG PO CPEP: 120.0000 mg | ORAL_CAPSULE | Freq: Every morning | ORAL | 1 refills | Status: DC

## 2019-03-23 NOTE — Progress Notes (Signed)
[facility]  Patient ID: Jeremy [name],  MRN: [id number]  PCP: Patient, No Pcp Per  Date of Evaluation: 03/23/2019 Time spent:15 minutes  Chief Complaint:  Chief Complaint    Follow-up      HISTORY/CURRENT STATUS: HPI For routine med check.  Jeremy is transferring to my care after his provider and my colleague Comer [name], [place], passed away recently.  Doing well.  Feels that meds are still working very well. Mood is good.  He is able to enjoy things.  Energy and motivation are good.  Reports that around Jeremy [name] every year he starts to feel more "down."  He has learned to do some things such as being outside even more, push himself to get up and get going every morning, and pray a lot.  Those things have been helpful in the past year or so.  He denies suicidal or homicidal thoughts.  Patient denies increased energy with decreased need for sleep, no increased talkativeness, no racing thoughts, no impulsivity or risky behaviors, no increased spending, no increased libido, no grandiosity.  No hallucinations.  Sleeps well.  No anxiety to speak of.  He had labs in July of this year with a physical exam.    Denies dizziness, syncope, seizures, numbness, tingling, tremor (no tremor that I see during our visit)  tics, unsteady gait, slurred speech, confusion. Denies muscle or joint pain, stiffness, or dystonia.  Individual Medical History/ Review of Systems: Changes? :Yes  essential tremor that's 'just getting started.' BPH now on treatment.  Past medications for mental health diagnoses include: (None listed in old chart)  Allergies: Aspergillus fumigatus, Aspirin, Sulfa antibiotics, and Theophyllines  Current Medications:  Current Outpatient Medications:  .  B Complex Vitamins (VITAMIN B COMPLEX) TABS, Take 1 tablet by mouth daily., Disp: , Rfl:  .  calcium-vitamin D (OSCAL WITH D) 500-200 MG-UNIT per tablet, Take 1 tablet by mouth daily., Disp: , Rfl:  .  DULoxetine  (CYMBALTA) 60 MG capsule, Take 2 capsules (120 mg total) by mouth every morning., Disp: 180 capsule, Rfl: 1 .  finasteride (PROSCAR) 5 MG tablet, Take 5 mg by mouth every morning., Disp: , Rfl:  .  magnesium 30 MG tablet, Take 30 mg by mouth daily., Disp: , Rfl:  .  mirtazapine (REMERON) 30 MG tablet, Take 1 tablet (30 mg total) by mouth at bedtime., Disp: 90 tablet, Rfl: 1 .  Multiple Vitamins-Minerals (MULTIVITAMIN WITH MINERALS) tablet, Take 1 tablet by mouth daily., Disp: , Rfl:  .  risperiDONE (RISPERDAL) 0.25 MG tablet, Take 1 tablet (0.25 mg total) by mouth at bedtime., Disp: 90 tablet, Rfl: 1 .  saw palmetto 160 MG capsule, Take 160 mg by mouth daily., Disp: , Rfl:  .  tamsulosin (FLOMAX) 0.4 MG CAPS capsule, Take 0.4 mg by mouth every evening., Disp: , Rfl:  .  vitamin C (ASCORBIC ACID) 500 MG tablet, Take 1,000 mg by mouth daily., Disp: , Rfl:  .  Vitamins C E (VITAMIN C & E COMBINATION PO), Take 1 tablet by mouth daily., Disp: , Rfl:  .  ZINC SULFATE PO, Take 1 tablet by mouth daily., Disp: , Rfl:  Medication Side Effects: none  Family Medical/ Social History: Changes? No.  He retired from [employer] after 20 years.  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  MENTAL HEALTH EXAM:  There were no vitals taken for this visit.There is  no height or weight on file to calculate BMI.  General Appearance: Casual and Well Groomed  Eye Contact:  Good  Speech:  Clear and Coherent  Volume:  Normal  Mood:  Euthymic  Affect:  Appropriate  Thought Process:  Goal Directed and Descriptions of Associations: Intact  Orientation:  Full (Time, Place, and Person)  Thought Content: Logical   Suicidal Thoughts:  No  Homicidal Thoughts:  No  Memory:  WNL  Judgement:  Good  Insight:  Good  Psychomotor Activity:  Normal  Concentration:  Concentration: Good  Recall:  Good  Fund of Knowledge: Good  Language: Good   Assets:  Desire for Improvement  ADL's:  Intact  Cognition: WNL  Prognosis:  Good  Labs from 12/22/2018 were reviewed.  CBC was within normal limits, glucose 93, BUN and creatinine were 12 and 1.0 respectively, liver function tests were normal, hemoglobin A1c 5.5, cholesterol 185, triglycerides 33, HDL 87 LDL 98  DIAGNOSES:    ICD-10-CM   1. Recurrent major depressive disorder, in remission ([place])  F33.40   2. History of psychosis  Z86.59     Receiving Psychotherapy: Yes with Dr. Jonni Sanger Mitchum   RECOMMENDATIONS:  I am glad to see him doing so well! Continue Cymbalta 60 mg 2 every morning. Continue mirtazapine 30 mg nightly. Continue Risperdal 0.25 mg nightly. Continue multivitamin, B complex, vitamin D with calcium, magnesium, vitamin C and E, and zinc. Continue therapy with Dr. [name]. Return in 6 months.  Donnal Moat, PA-C

## 2019-04-15 DIAGNOSIS — R10819 Abdominal tenderness, unspecified site: Secondary | ICD-10-CM | POA: Diagnosis not present

## 2019-04-15 DIAGNOSIS — M79672 Pain in left foot: Secondary | ICD-10-CM | POA: Diagnosis not present

## 2019-05-22 ENCOUNTER — Ambulatory Visit (INDEPENDENT_AMBULATORY_CARE_PROVIDER_SITE_OTHER): Payer: Medicare Other | Admitting: Psychiatry

## 2019-05-22 DIAGNOSIS — Z8659 Personal history of other mental and behavioral disorders: Secondary | ICD-10-CM | POA: Diagnosis not present

## 2019-05-22 DIAGNOSIS — F3340 Major depressive disorder, recurrent, in remission, unspecified: Secondary | ICD-10-CM

## 2019-05-22 NOTE — Progress Notes (Signed)
Psychotherapy Progress Note [facility] Jeremy Moore, PhD LP  Patient ID: Jeremy [name]     MRN: [id number]     Therapy format: Individual psychotherapy Date: 05/22/2019      Start: 2:10p     Stop: 3:00p     Time Spent: 50 min Location: Telehealth visit -- I connected with this patient by an approved telecommunication method (video), with his informed consent, and verifying identity and patient privacy.  I was located at my office and patient at his home.  As needed, we discussed the limitations, risks, and security and privacy concerns associated with telehealth service, including the availability and conditions which currently govern in-person appointments and the possibility that 3rd-party payment may not be fully guaranteed and he may be responsible for charges.  After he indicated understanding, we proceeded with the session.  Also discussed treatment planning, as needed, including ongoing verbal agreement with the plan, the opportunity to ask and answer all questions, his demonstrated understanding of instructions, and his readiness to call the office should symptoms worsen or he feels he is in a crisis state and needs more immediate and tangible assistance.   Session narrative (presenting needs, interim history, self-report of stressors and symptoms, applications of prior therapy, status changes, and interventions made in session) Continues in appropriately restrained socialization during pandemic.  Enjoys walking in his neighborhood, front-yard garden draws visitors.  First-time grandparent a/o November 9 -- Jeremy [name], now at [facility], and his wife Jeremy [name].  All healthy and happy, got the chance to meet at 56-week old at botanical gardens.  Jeremy [name] seems to be OK psychiatrically, seeking other jobs right soon.    Overall mood good.  Thanksgiving was striking for the lack of family, which he knew, and his typical autumn downturn as gardening season ends.  Has been guarding against  mood downturn knowing Veterans' Day is a seasonal marker.  But it particularly hit him Thanksgiving for a couple days.  Made use of prayer, rising anyway and getting light.  Planning a round of calls to loved ones to wish Jeremy [name] Christmas, figures to   Has registered with a research program on brain health, memory functioning.  Figures it was good emotional exercise for him to answer uncomfortable questions about his depression history.  F/u to summer stresses, he has called Jeremy [name], who seems to be in good recovery (after attempted hanging, kids found him, wife did CPR).  Understanding that anxiety issues run in the family.  PT has particularly reached out to be in touch, and it has been blessing to share of his own experience facing severe mental illness.  No further dreams of violence against mother, but is aware of hard feelings toward Jeremy [name] from childhood.  Also notes Sharlet [name] resembles mother, may be a kind of stand-in   Daughter Jeremy [name] now an [name], proud of her.    Next time, foresees need to process issues with brother, Sharlet [name].  Offered to journal or do imaginary therapy at his own liking.    Favorable acquaintance with new prescriber, Ms. Jeremy [name].    Therapeutic modalities: Cognitive Behavioral Therapy and Solution-Oriented/Positive Psychology  Mental Status/Observations:  Appearance:   Casual and Neat     Behavior:  Appropriate  Motor:  Normal  Speech/Language:   Clear and Coherent  Affect:  Appropriate  Mood:  euthymic  Thought process:  normal  Thought content:    WNL  Sensory/Perceptual disturbances:    WNL  Orientation:  Fully oriented  Attention:  Good  Concentration:  Good  Memory:  WNL  Insight:    Good  Judgment:   Good  Impulse Control:  Good   Risk Assessment: Danger to Self: No Self-injurious Behavior: No Danger to Others: No Physical Aggression / Violence: No Duty to Warn: No Access to Firearms a concern: No  Assessment of progress:  progressing well  Diagnosis:    ICD-10-CM   1. Recurrent major depressive disorder, in remission ([place])  F33.40   2. History of psychosis  Z86.59    Plan:  . Maintain social contacts as suited for depression prevention . Option to process rivalry with brother next session.  Further option to hold imaginary therapy sessions or journal in the interrm . Other recommendations/advice as noted above . Continue to utilize previously learned skills ad lib . Maintain medication as prescribed and work faithfully with relevant prescriber(s) if any changes are desired or seem indicated . Call the clinic on-call service, present to ER, or call 911 if any life-threatening psychiatric crisis Return in about 6 months (around 11/20/2019). Current [facility] appointments: Future Appointments  Date Time Provider [place]  08/28/2019  2:30 PM Addison Lank, PA-C CP-CP None    Blanchie Serve, PhD Jeremy Moore, PhD LP Clinical Psychologist, [facility] [address], [place], [place] [address] [phone]

## 2019-06-29 ENCOUNTER — Ambulatory Visit: Payer: Medicare Other | Attending: Internal Medicine

## 2019-06-29 DIAGNOSIS — Z23 Encounter for immunization: Secondary | ICD-10-CM | POA: Insufficient documentation

## 2019-06-29 NOTE — Progress Notes (Signed)
   Covid-19 Vaccination Clinic  Name:  Jeremy [name]    MRN: [id number] DOB: 17-Jul-1935  06/29/2019  [name] was observed post Covid-19 immunization for 15 minutes without incidence. He was provided with Vaccine Information Sheet and instruction to access the V-Safe system.   [name] was instructed to call 911 with any severe reactions post vaccine: Marland Kitchen Difficulty breathing  . Swelling of your face and throat  . A fast heartbeat  . A bad rash all over your body  . Dizziness and weakness    Immunizations Administered    Name Date Dose VIS Date Route   Pfizer COVID-19 Vaccine 06/29/2019  9:21 AM 0.3 mL 05/15/2019 Intramuscular   Manufacturer: [place]   Lot: [id number]   [place]: [id number]

## 2019-07-20 ENCOUNTER — Ambulatory Visit: Payer: Medicare Other | Attending: Internal Medicine

## 2019-07-20 DIAGNOSIS — Z23 Encounter for immunization: Secondary | ICD-10-CM | POA: Insufficient documentation

## 2019-07-20 NOTE — Progress Notes (Signed)
   Covid-19 Vaccination Clinic  Name:  Jeremy [name]    MRN: [id number] DOB: 02-Dec-1935  07/20/2019  [name] was observed post Covid-19 immunization for 15 minutes without incidence. He was provided with Vaccine Information Sheet and instruction to access the V-Safe system.   [name] was instructed to call 911 with any severe reactions post vaccine: Marland Kitchen Difficulty breathing  . Swelling of your face and throat  . A fast heartbeat  . A bad rash all over your body  . Dizziness and weakness    Immunizations Administered    Name Date Dose VIS Date Route   Pfizer COVID-19 Vaccine 07/20/2019 10:17 AM 0.3 mL 05/15/2019 Intramuscular   Manufacturer: [place]   Lot: [id number]   [place]: [id number]

## 2019-08-04 DIAGNOSIS — Z961 Presence of intraocular lens: Secondary | ICD-10-CM | POA: Diagnosis not present

## 2019-08-25 ENCOUNTER — Ambulatory Visit: Payer: Medicare Other | Admitting: Physician Assistant

## 2019-08-28 ENCOUNTER — Encounter: Payer: Self-pay | Admitting: Physician Assistant

## 2019-08-28 ENCOUNTER — Ambulatory Visit (INDEPENDENT_AMBULATORY_CARE_PROVIDER_SITE_OTHER): Payer: Medicare Other | Admitting: Physician Assistant

## 2019-08-28 DIAGNOSIS — Z8659 Personal history of other mental and behavioral disorders: Secondary | ICD-10-CM | POA: Diagnosis not present

## 2019-08-28 DIAGNOSIS — G4700 Insomnia, unspecified: Secondary | ICD-10-CM | POA: Diagnosis not present

## 2019-08-28 DIAGNOSIS — F3340 Major depressive disorder, recurrent, in remission, unspecified: Secondary | ICD-10-CM | POA: Diagnosis not present

## 2019-08-28 MED ORDER — DULOXETINE HCL 60 MG PO CPEP: 120.0000 mg | ORAL_CAPSULE | Freq: Every morning | ORAL | 1 refills | Status: DC

## 2019-08-28 MED ORDER — RISPERIDONE 0.25 MG PO TABS: 0.2500 mg | ORAL_TABLET | Freq: Every day | ORAL | 1 refills | Status: DC

## 2019-08-28 MED ORDER — MIRTAZAPINE 30 MG PO TABS: 30.0000 mg | ORAL_TABLET | Freq: Every day | ORAL | 1 refills | Status: DC

## 2019-08-28 NOTE — Progress Notes (Signed)
[facility]  Patient ID: Jeremy [name],  MRN: [id number]  PCP: Patient, No Pcp Per  Date of Evaluation: 08/28/2019 Time spent:20 minutes  Chief Complaint:  Chief Complaint    Follow-up     Virtual Visit via Telephone Note  I connected with patient by a video enabled telemedicine application or telephone, with their informed consent, and verified patient privacy and that I am speaking with the correct person using two identifiers.  I am private, in my office and the patient is home.  I discussed the limitations, risks, security and privacy concerns of performing an evaluation and management service by telephone and the availability of in person appointments. I also discussed with the patient that there may be a patient responsible charge related to this service. The patient expressed understanding and agreed to proceed.   I discussed the assessment and treatment plan with the patient. The patient was provided an opportunity to ask questions and all were answered. The patient agreed with the plan and demonstrated an understanding of the instructions.   The patient was advised to call back or seek an in-person evaluation if the symptoms worsen or if the condition fails to improve as anticipated.  I provided 20 minutes of non-face-to-face time during this encounter.  HISTORY/CURRENT STATUS: HPI For 6 month med check.  Doing well.  Meds are working fine.  Has been on same regimen for awhile w/o problems.  He continues to see Dr. [name] in counseling, usually every 6 months.  He is able to enjoy things and is looking forward to getting his flower garden started.  This is been a hobby for around 45 years.  Energy and motivation are good.  States he does get more sad in the wintertime, he is noticed that for a few years, but once spring comes, he is able to work in his garden, and we have sunlight longer, he always feels better.  Denies suicidal or homicidal  thoughts.  Patient denies increased energy with decreased need for sleep, no increased talkativeness, no racing thoughts, no impulsivity or risky behaviors, no increased spending, no increased libido, no grandiosity.  Not having anxiety symptoms, or at least not very often. Sleeps well most of the time.  He does use mirtazapine to help that.    Denies dizziness, syncope, seizures, numbness, tingling, tremor, tics, unsteady gait, slurred speech, confusion. Denies muscle or joint pain, stiffness, or dystonia.  Individual Medical History/ Review of Systems: Changes? :No    Past medications for mental health diagnoses include: (None listed in old chart)  Allergies: Aspergillus fumigatus, Aspirin, Sulfa antibiotics, and Theophyllines  Current Medications:  Current Outpatient Medications:  .  B Complex Vitamins (VITAMIN B COMPLEX) TABS, Take 1 tablet by mouth daily., Disp: , Rfl:  .  calcium-vitamin D (OSCAL WITH D) 500-200 MG-UNIT per tablet, Take 1 tablet by mouth daily., Disp: , Rfl:  .  DULoxetine (CYMBALTA) 60 MG capsule, Take 2 capsules (120 mg total) by mouth every morning., Disp: 180 capsule, Rfl: 1 .  finasteride (PROSCAR) 5 MG tablet, Take 5 mg by mouth every morning., Disp: , Rfl:  .  magnesium 30 MG tablet, Take 30 mg by mouth daily., Disp: , Rfl:  .  mirtazapine (REMERON) 30 MG tablet, Take 1 tablet (30 mg total) by mouth at bedtime., Disp: 90 tablet, Rfl: 1 .  Multiple Vitamins-Minerals (MULTIVITAMIN WITH MINERALS) tablet, Take 1 tablet by mouth daily., Disp: , Rfl:  .  risperiDONE (RISPERDAL) 0.25 MG tablet, Take  1 tablet (0.25 mg total) by mouth at bedtime., Disp: 90 tablet, Rfl: 1 .  saw palmetto 160 MG capsule, Take 160 mg by mouth daily., Disp: , Rfl:  .  tamsulosin (FLOMAX) 0.4 MG CAPS capsule, Take 0.4 mg by mouth every evening., Disp: , Rfl:  .  Vitamins C E (VITAMIN C & E COMBINATION PO), Take 1 tablet by mouth daily., Disp: , Rfl:  .  ZINC SULFATE PO, Take 1 tablet by  mouth daily., Disp: , Rfl:  Medication Side Effects: none  Family Medical/ Social History: Changes? No  MENTAL HEALTH EXAM:  There were no vitals taken for this visit.There is no height or weight on file to calculate BMI.  General Appearance: Neat and Well Groomed  Eye Contact:  Good  Speech:  Clear and Coherent and Normal Rate  Volume:  Normal  Mood:  Euthymic  Affect:  Appropriate  Thought Process:  Goal Directed and Descriptions of Associations: Intact  Orientation:  Full (Time, Place, and Person)  Thought Content: Logical   Suicidal Thoughts:  No  Homicidal Thoughts:  No  Memory:  WNL  Judgement:  Good  Insight:  Good  Psychomotor Activity:  Normal  Concentration:  Concentration: Good  Recall:  Good  Fund of Knowledge: Good  Language: Good  Assets:  Desire for Improvement  ADL's:  Intact  Cognition: WNL  Prognosis:  Good  No available labs on chart.  He is diligent about having an annual physical which I believe is coming up soon.  Patient states he has not had any abnormal labs recently.  DIAGNOSES:    ICD-10-CM   1. Recurrent major depressive disorder, in remission ([place])  F33.40   2. History of psychosis  Z86.59   3. Insomnia, unspecified type  G47.00     Receiving Psychotherapy: Yes  with Dr. [name]   RECOMMENDATIONS:  PDMP was reviewed. I spent 20 minutes with him. I am glad to see him doing so well! Continue Cymbalta 60 mg, 2 p.o. every morning. Continue mirtazapine 30 mg, 1 p.o. nightly. Continue risperidone 0.25 mg, 1 p.o. nightly. Continue multivitamin, B complex, calcium with vitamin D, magnesium, saw palmetto, vitamin C, vitamin D, and zinc. Continue therapy with Dr. [name]. Return in 6 months.  Donnal Moat, PA-C

## 2019-09-18 DIAGNOSIS — N400 Enlarged prostate without lower urinary tract symptoms: Secondary | ICD-10-CM | POA: Diagnosis not present

## 2019-10-14 DIAGNOSIS — R972 Elevated prostate specific antigen [PSA]: Secondary | ICD-10-CM | POA: Diagnosis not present

## 2019-10-14 DIAGNOSIS — N401 Enlarged prostate with lower urinary tract symptoms: Secondary | ICD-10-CM | POA: Diagnosis not present

## 2019-11-20 ENCOUNTER — Ambulatory Visit (INDEPENDENT_AMBULATORY_CARE_PROVIDER_SITE_OTHER): Payer: Medicare Other | Admitting: Psychiatry

## 2019-11-20 DIAGNOSIS — Z8659 Personal history of other mental and behavioral disorders: Secondary | ICD-10-CM | POA: Diagnosis not present

## 2019-11-20 DIAGNOSIS — F3340 Major depressive disorder, recurrent, in remission, unspecified: Secondary | ICD-10-CM

## 2019-11-20 NOTE — Progress Notes (Signed)
Psychotherapy Progress Note [facility] [name], PhD LP  Patient ID: Jeremy [name]     MRN: [id number] Therapy format: Individual psychotherapy Date: 11/20/2019      Start: 2:12p     Stop: 3:02p     Time Spent: 50 min Location: Telehealth visit -- I connected with this patient by an approved telecommunication method (video), with his informed consent, and verifying identity and patient privacy.  I was located at my office and patient at his home.  As needed, we discussed the limitations, risks, and security and privacy concerns associated with telehealth service, including the availability and conditions which currently govern in-person appointments and the possibility that 3rd-party payment may not be fully guaranteed and he may be responsible for charges.  After he indicated understanding, we proceeded with the session.  Also discussed treatment planning, as needed, including ongoing verbal agreement with the plan, the opportunity to ask and answer all questions, his demonstrated understanding of instructions, and his readiness to call the office should symptoms worsen or he feels he is in a crisis state and needs more immediate and tangible assistance.   Session narrative (presenting needs, interim history, self-report of stressors and symptoms, applications of prior therapy, status changes, and interventions made in session) Essential tremor may be exacerbated these days, more pronounced on camera than previously seen.  Has learned that relaxing his shoulders helps.  Mother had very pronounced tremor herself.  Figures it's "stress" involved with himself, but assured not so much, just a loosening of nerve impulses and timing that comes with time and wear.  Notably did not have a winter depression this year.  Credits going outside even without gardening to give him a reason.  Semi-obsessed himself with pulling weeds and got an overuse injury in his wrist.  Helped with COVID  captivity, too.  Marian's "COVID mode" was reading, bought a lot of books from [employer].  While left wrist was down, took on the challenge of using his nondominant hand.  Main challenge now to recruit [name] out of the "nest"/rut she got in.  Used to walk avidly together, but she had right TKR a while back, and her left knee is now under consideration.  (She still walks, with trekking poles, after a couple bad falls on uneven sidewalk.)  Current habit is for him to work out at home and then run/swim at the time she's walking, but might want to join her more.  Affirmed and encouraged.  In a longitudinal brain health study, run out of [place] (memory tests q 6 mos).  No particular worries about cognitive status, just noticing very occasional thought/word-finding.  Feels fortunate, and can take credit for his active fitness regimen helping keep his brain healthy.  Feels young, really, not old, despite 84yo.  Sees friends hit the skids in their 57s, sometimes dramatically, which can be not so much intimidating as lonely making, but stays active with family and friends.  Granddaughter Unk Lightning now 7 mos, gets to see a lot of great pictures of her on electronic frame [name] gave them for [employer], as well as phone videos.  Had 6 days in person helping them prepare for a move to [place].  Heading up tomorrow for first visit to [facility] to see them.    PT initiated a men's group combining [employer] men and men of [place] (black congregation), who will meet tomorrow for [employer].  Discussed purposes, meanings and prospects for recruiting wider interest  in a positive experiment promoting race relations.  Discussed EHR and MyChart, which he is new to.  Wants to know if he can message [place] via [place], discussed current status and practice and framed   Daughter [name] is now a Landscape architect in [place], [place].  Offered congratulations.  Therapeutic modalities: Cognitive Behavioral Therapy,  Solution-Oriented/Positive Psychology, Ego-Supportive and Humanistic/Existential  Mental Status/Observations:  Appearance:   Casual and Neat     Behavior:  Appropriate and standing, as per usual  Motor:  Normal, exc. neck tremor noted  Speech/Language:   Clear and Coherent  Affect:  Appropriate  Mood:  normal  Thought process:  normal  Thought content:    WNL  Sensory/Perceptual disturbances:    WNL  Orientation:  Fully oriented  Attention:  Good    Concentration:  Good  Memory:  WNL  Insight:    Good  Judgment:   Good  Impulse Control:  Good   Risk Assessment: Danger to Self: No Self-injurious Behavior: No Danger to Others: No Physical Aggression / Violence: No Duty to Warn: No Access to Firearms a concern: No  Assessment of progress:  progressing  Diagnosis:   ICD-10-CM   1. Recurrent major depressive disorder, in remission ([place])  F33.40   2. History of psychosis  Z86.59    Plan:  . Continue self-care regimen and exercise habits . Continue dedicated effort to interact with wife and others . Continue active exposure to outdoors, natural light where needed, especially as seasonal triggers come back . Other recommendations/advice as may be noted above . Continue to utilize previously learned skills ad lib . Maintain medication as prescribed and work faithfully with relevant prescriber(s) if any changes are desired or seem indicated . Call the clinic on-call service, present to ER, or call 911 if any life-threatening psychiatric crisis Return in about 6 months (around 05/21/2020) for time at discretion. . Already scheduled visit in this office 02/26/2020.  Blanchie Serve, PhD [name], PhD LP Clinical Psychologist, [facility] [address] [place], [place] [address] [phone]

## 2019-12-23 DIAGNOSIS — E785 Hyperlipidemia, unspecified: Secondary | ICD-10-CM | POA: Diagnosis not present

## 2019-12-23 DIAGNOSIS — D649 Anemia, unspecified: Secondary | ICD-10-CM | POA: Diagnosis not present

## 2019-12-30 DIAGNOSIS — Z0000 Encounter for general adult medical examination without abnormal findings: Secondary | ICD-10-CM | POA: Diagnosis not present

## 2019-12-30 DIAGNOSIS — E538 Deficiency of other specified B group vitamins: Secondary | ICD-10-CM | POA: Diagnosis not present

## 2019-12-30 DIAGNOSIS — N400 Enlarged prostate without lower urinary tract symptoms: Secondary | ICD-10-CM | POA: Diagnosis not present

## 2019-12-30 DIAGNOSIS — J302 Other seasonal allergic rhinitis: Secondary | ICD-10-CM | POA: Diagnosis not present

## 2019-12-30 DIAGNOSIS — F339 Major depressive disorder, recurrent, unspecified: Secondary | ICD-10-CM | POA: Diagnosis not present

## 2019-12-30 DIAGNOSIS — K219 Gastro-esophageal reflux disease without esophagitis: Secondary | ICD-10-CM | POA: Diagnosis not present

## 2019-12-30 DIAGNOSIS — E559 Vitamin D deficiency, unspecified: Secondary | ICD-10-CM | POA: Diagnosis not present

## 2019-12-30 DIAGNOSIS — R972 Elevated prostate specific antigen [PSA]: Secondary | ICD-10-CM | POA: Diagnosis not present

## 2019-12-30 DIAGNOSIS — M8580 Other specified disorders of bone density and structure, unspecified site: Secondary | ICD-10-CM | POA: Diagnosis not present

## 2020-02-26 ENCOUNTER — Ambulatory Visit: Payer: Medicare Other | Admitting: Physician Assistant

## 2020-03-03 DIAGNOSIS — Z23 Encounter for immunization: Secondary | ICD-10-CM | POA: Diagnosis not present

## 2020-03-11 ENCOUNTER — Telehealth (INDEPENDENT_AMBULATORY_CARE_PROVIDER_SITE_OTHER): Payer: Medicare Other | Admitting: Physician Assistant

## 2020-03-11 ENCOUNTER — Telehealth: Payer: Self-pay | Admitting: Physician Assistant

## 2020-03-11 ENCOUNTER — Encounter: Payer: Self-pay | Admitting: Physician Assistant

## 2020-03-11 DIAGNOSIS — F3340 Major depressive disorder, recurrent, in remission, unspecified: Secondary | ICD-10-CM

## 2020-03-11 DIAGNOSIS — G4700 Insomnia, unspecified: Secondary | ICD-10-CM

## 2020-03-11 MED ORDER — DULOXETINE HCL 60 MG PO CPEP: 120.0000 mg | ORAL_CAPSULE | Freq: Every morning | ORAL | 1 refills | Status: DC

## 2020-03-11 MED ORDER — MIRTAZAPINE 30 MG PO TABS: 30.0000 mg | ORAL_TABLET | Freq: Every day | ORAL | 1 refills | Status: DC

## 2020-03-11 MED ORDER — RISPERIDONE 0.25 MG PO TABS: 0.2500 mg | ORAL_TABLET | Freq: Every day | ORAL | 1 refills | Status: DC

## 2020-03-11 NOTE — Progress Notes (Addendum)
[facility]  Patient ID: Jeremy [name],  MRN: [id number]  PCP: Patient, No Pcp Per  Date of Evaluation: 03/11/2020 Time spent:20 minutes  Chief Complaint:  Chief Complaint    Depression     Virtual Visit via Telehealth  I connected with patient by a video enabled telemedicine application with their informed consent, and verified patient privacy and that I am speaking with the correct person using two identifiers.  I am private, in my office and the patient is at home.  I discussed the limitations, risks, security and privacy concerns of performing an evaluation and management service by video and the availability of in person appointments. I also discussed with the patient that there may be a patient responsible charge related to this service. The patient expressed understanding and agreed to proceed.   I discussed the assessment and treatment plan with the patient. The patient was provided an opportunity to ask questions and all were answered. The patient agreed with the plan and demonstrated an understanding of the instructions.   The patient was advised to call back or seek an in-person evaluation if the symptoms worsen or if the condition fails to improve as anticipated.  I provided 20 minutes of non-face-to-face time during this encounter.  HISTORY/CURRENT STATUS: HPI For 6 month med check.  He is able to enjoy things and he enjoyed the summer and his flower garden very much.  He often has SAD which begins usually in early November if it is going to start.  Last year it was not as bad.  Even though it was cold and sometimes rainy he went outside anyway, which did seem to help with the depression.  Energy and motivation are good. Not isolating. Not having anxiety symptoms, or at least not very often. Sleeps well most of the time.  He does use mirtazapine to help that.  No SI/HI.  Patient denies increased energy with decreased need for sleep, no increased talkativeness, no  racing thoughts, no impulsivity or risky behaviors, no increased spending, no increased libido, no grandiosity, no paranoia, and no hallucinations.  Denies dizziness, syncope, seizures, numbness, tingling, tremor, tics, unsteady gait, slurred speech, confusion. Denies muscle or joint pain, stiffness, or dystonia.  Individual Medical History/ Review of Systems: Changes? :No    Past medications for mental health diagnoses include: (None listed in old chart)  Allergies: Aspergillus fumigatus, Aspirin, Sulfa antibiotics, and Theophyllines  Current Medications:  Current Outpatient Medications:  .  B Complex Vitamins (VITAMIN B COMPLEX) TABS, Take 1 tablet by mouth daily., Disp: , Rfl:  .  calcium-vitamin D (OSCAL WITH D) 500-200 MG-UNIT per tablet, Take 1 tablet by mouth daily., Disp: , Rfl:  .  DULoxetine (CYMBALTA) 60 MG capsule, Take 2 capsules (120 mg total) by mouth every morning., Disp: 180 capsule, Rfl: 1 .  finasteride (PROSCAR) 5 MG tablet, Take 5 mg by mouth every morning., Disp: , Rfl:  .  magnesium 30 MG tablet, Take 30 mg by mouth daily., Disp: , Rfl:  .  mirtazapine (REMERON) 30 MG tablet, Take 1 tablet (30 mg total) by mouth at bedtime., Disp: 90 tablet, Rfl: 1 .  Multiple Vitamins-Minerals (MULTIVITAMIN WITH MINERALS) tablet, Take 1 tablet by mouth daily., Disp: , Rfl:  .  risperiDONE (RISPERDAL) 0.25 MG tablet, Take 1 tablet (0.25 mg total) by mouth at bedtime., Disp: 90 tablet, Rfl: 1 .  saw palmetto 160 MG capsule, Take 160 mg by mouth daily., Disp: , Rfl:  .  tamsulosin (FLOMAX)  0.4 MG CAPS capsule, Take 0.4 mg by mouth every evening., Disp: , Rfl:  .  Vitamins C E (VITAMIN C & E COMBINATION PO), Take 1 tablet by mouth daily., Disp: , Rfl:  .  ZINC SULFATE PO, Take 1 tablet by mouth daily., Disp: , Rfl:  Medication Side Effects: none  Family Medical/ Social History: Changes? No  MENTAL HEALTH EXAM:  There were no vitals taken for this visit.There is no height or weight  on file to calculate BMI.  General Appearance: Unable to assess  Eye Contact:  Unable to assess  Speech:  Clear and Coherent and Normal Rate  Volume:  Normal  Mood:  Euthymic  Affect:  Appropriate  Thought Process:  Goal Directed and Descriptions of Associations: Intact  Orientation:  Full (Time, Place, and Person)  Thought Content: Logical   Suicidal Thoughts:  No  Homicidal Thoughts:  No  Memory:  WNL  Judgement:  Good  Insight:  Good  Psychomotor Activity:  Unable to assess  Concentration:  Concentration: Good and Attention Span: Good  Recall:  Good  Fund of Knowledge: Good  Language: Good  Assets:  Desire for Improvement  ADL's:  Intact  Cognition: WNL  Prognosis:  Good   Addendum 03/28/2020 Most recent pertinent labs 12/23/2019 Glu 83 Total cholesterol 189, triglycerides 41, HDL 95, LDL 86   DIAGNOSES:    ICD-10-CM   1. Recurrent major depressive disorder, in remission ([place])  F33.40   2. Insomnia, unspecified type  G47.00     Receiving Psychotherapy: Yes  with Dr. [name]   RECOMMENDATIONS:  PDMP was reviewed. I provided 20 minutes of nonface-to-face time during this encounter. I am glad to hear that he is doing so well! We did discuss the seasonal affective disorder.  I suggested a therapy lamp be used each morning for around 20 to 30 minutes.  And since he responded well to being outdoors even in the cold and or rain last winter, do that again this year, as long as it safe. Continue Cymbalta 60 mg, 2 p.o. every morning. Continue mirtazapine 30 mg, 1 p.o. nightly. Continue risperidone 0.25 mg, 1 p.o. nightly. Continue multivitamin, B complex, calcium with vitamin D, magnesium, saw palmetto, vitamin C, vitamin D, and zinc. Continue therapy with Dr. [name]. He will have labs that were done this summer sent to me. Return in 6 months.  Donnal Moat, PA-C

## 2020-03-11 NOTE — Telephone Encounter (Signed)
Mr. jeriah, corkum are scheduled for a virtual visit with your provider today.    Just as we do with appointments in the office, we must obtain your consent to participate.  Your consent will be active for this visit and any virtual visit you may have with one of our providers in the next 365 days.    If you have a MyChart account, I can also send a copy of this consent to you electronically.  All virtual visits are billed to your insurance company just like a traditional visit in the office.  As this is a virtual visit, video technology does not allow for your provider to perform a traditional examination.  This may limit your provider's ability to fully assess your condition.  If your provider identifies any concerns that need to be evaluated in person or the need to arrange testing such as labs, EKG, etc, we will make arrangements to do so.    Although advances in technology are sophisticated, we cannot ensure that it will always work on either your end or our end.  If the connection with a video visit is poor, we may have to switch to a telephone visit.  With either a video or telephone visit, we are not always able to ensure that we have a secure connection.   I need to obtain your verbal consent now.   Are you willing to proceed with your visit today?   Natale Thoma has provided verbal consent on 03/11/2020 for a virtual visit (video or telephone).   Donnal Moat, PA-C 03/11/2020  3:07 PM

## 2020-04-08 DIAGNOSIS — Z23 Encounter for immunization: Secondary | ICD-10-CM | POA: Diagnosis not present

## 2020-05-04 DIAGNOSIS — M9902 Segmental and somatic dysfunction of thoracic region: Secondary | ICD-10-CM | POA: Diagnosis not present

## 2020-05-04 DIAGNOSIS — M9903 Segmental and somatic dysfunction of lumbar region: Secondary | ICD-10-CM | POA: Diagnosis not present

## 2020-05-04 DIAGNOSIS — M47816 Spondylosis without myelopathy or radiculopathy, lumbar region: Secondary | ICD-10-CM | POA: Diagnosis not present

## 2020-05-04 DIAGNOSIS — M9901 Segmental and somatic dysfunction of cervical region: Secondary | ICD-10-CM | POA: Diagnosis not present

## 2020-05-10 DIAGNOSIS — M47816 Spondylosis without myelopathy or radiculopathy, lumbar region: Secondary | ICD-10-CM | POA: Diagnosis not present

## 2020-05-10 DIAGNOSIS — M9901 Segmental and somatic dysfunction of cervical region: Secondary | ICD-10-CM | POA: Diagnosis not present

## 2020-05-10 DIAGNOSIS — M9902 Segmental and somatic dysfunction of thoracic region: Secondary | ICD-10-CM | POA: Diagnosis not present

## 2020-05-10 DIAGNOSIS — M9903 Segmental and somatic dysfunction of lumbar region: Secondary | ICD-10-CM | POA: Diagnosis not present

## 2020-05-11 DIAGNOSIS — M47816 Spondylosis without myelopathy or radiculopathy, lumbar region: Secondary | ICD-10-CM | POA: Diagnosis not present

## 2020-05-11 DIAGNOSIS — M9902 Segmental and somatic dysfunction of thoracic region: Secondary | ICD-10-CM | POA: Diagnosis not present

## 2020-05-11 DIAGNOSIS — M9901 Segmental and somatic dysfunction of cervical region: Secondary | ICD-10-CM | POA: Diagnosis not present

## 2020-05-11 DIAGNOSIS — M9903 Segmental and somatic dysfunction of lumbar region: Secondary | ICD-10-CM | POA: Diagnosis not present

## 2020-05-16 DIAGNOSIS — M9903 Segmental and somatic dysfunction of lumbar region: Secondary | ICD-10-CM | POA: Diagnosis not present

## 2020-05-16 DIAGNOSIS — M9902 Segmental and somatic dysfunction of thoracic region: Secondary | ICD-10-CM | POA: Diagnosis not present

## 2020-05-16 DIAGNOSIS — M47816 Spondylosis without myelopathy or radiculopathy, lumbar region: Secondary | ICD-10-CM | POA: Diagnosis not present

## 2020-05-16 DIAGNOSIS — M9901 Segmental and somatic dysfunction of cervical region: Secondary | ICD-10-CM | POA: Diagnosis not present

## 2020-05-18 DIAGNOSIS — M9903 Segmental and somatic dysfunction of lumbar region: Secondary | ICD-10-CM | POA: Diagnosis not present

## 2020-05-18 DIAGNOSIS — M9901 Segmental and somatic dysfunction of cervical region: Secondary | ICD-10-CM | POA: Diagnosis not present

## 2020-05-18 DIAGNOSIS — M47816 Spondylosis without myelopathy or radiculopathy, lumbar region: Secondary | ICD-10-CM | POA: Diagnosis not present

## 2020-05-18 DIAGNOSIS — M9902 Segmental and somatic dysfunction of thoracic region: Secondary | ICD-10-CM | POA: Diagnosis not present

## 2020-05-23 DIAGNOSIS — M9903 Segmental and somatic dysfunction of lumbar region: Secondary | ICD-10-CM | POA: Diagnosis not present

## 2020-05-23 DIAGNOSIS — M9902 Segmental and somatic dysfunction of thoracic region: Secondary | ICD-10-CM | POA: Diagnosis not present

## 2020-05-23 DIAGNOSIS — M9901 Segmental and somatic dysfunction of cervical region: Secondary | ICD-10-CM | POA: Diagnosis not present

## 2020-05-23 DIAGNOSIS — M47816 Spondylosis without myelopathy or radiculopathy, lumbar region: Secondary | ICD-10-CM | POA: Diagnosis not present

## 2020-05-25 DIAGNOSIS — M47816 Spondylosis without myelopathy or radiculopathy, lumbar region: Secondary | ICD-10-CM | POA: Diagnosis not present

## 2020-05-25 DIAGNOSIS — M9903 Segmental and somatic dysfunction of lumbar region: Secondary | ICD-10-CM | POA: Diagnosis not present

## 2020-05-25 DIAGNOSIS — M9901 Segmental and somatic dysfunction of cervical region: Secondary | ICD-10-CM | POA: Diagnosis not present

## 2020-05-25 DIAGNOSIS — M9902 Segmental and somatic dysfunction of thoracic region: Secondary | ICD-10-CM | POA: Diagnosis not present

## 2020-06-07 ENCOUNTER — Other Ambulatory Visit: Payer: Medicare Other

## 2020-06-07 DIAGNOSIS — Z20822 Contact with and (suspected) exposure to covid-19: Secondary | ICD-10-CM

## 2020-06-07 DIAGNOSIS — M9901 Segmental and somatic dysfunction of cervical region: Secondary | ICD-10-CM | POA: Diagnosis not present

## 2020-06-07 DIAGNOSIS — M47816 Spondylosis without myelopathy or radiculopathy, lumbar region: Secondary | ICD-10-CM | POA: Diagnosis not present

## 2020-06-07 DIAGNOSIS — M9902 Segmental and somatic dysfunction of thoracic region: Secondary | ICD-10-CM | POA: Diagnosis not present

## 2020-06-07 DIAGNOSIS — M9903 Segmental and somatic dysfunction of lumbar region: Secondary | ICD-10-CM | POA: Diagnosis not present

## 2020-06-09 LAB — SARS-COV-2, NAA 2 DAY TAT

## 2020-06-09 LAB — NOVEL CORONAVIRUS, NAA: SARS-CoV-2, NAA: NOT DETECTED

## 2020-08-15 DIAGNOSIS — H52203 Unspecified astigmatism, bilateral: Secondary | ICD-10-CM | POA: Diagnosis not present

## 2020-08-15 DIAGNOSIS — Z961 Presence of intraocular lens: Secondary | ICD-10-CM | POA: Diagnosis not present

## 2020-09-09 ENCOUNTER — Ambulatory Visit (INDEPENDENT_AMBULATORY_CARE_PROVIDER_SITE_OTHER): Payer: Medicare Other | Admitting: Physician Assistant

## 2020-09-09 ENCOUNTER — Encounter: Payer: Self-pay | Admitting: Physician Assistant

## 2020-09-09 ENCOUNTER — Other Ambulatory Visit: Payer: Self-pay

## 2020-09-09 DIAGNOSIS — G4700 Insomnia, unspecified: Secondary | ICD-10-CM | POA: Diagnosis not present

## 2020-09-09 DIAGNOSIS — F3340 Major depressive disorder, recurrent, in remission, unspecified: Secondary | ICD-10-CM

## 2020-09-09 MED ORDER — MIRTAZAPINE 30 MG PO TABS: 30.0000 mg | ORAL_TABLET | Freq: Every day | ORAL | 1 refills | Status: DC

## 2020-09-09 MED ORDER — DULOXETINE HCL 60 MG PO CPEP: 120.0000 mg | ORAL_CAPSULE | Freq: Every morning | ORAL | 1 refills | Status: DC

## 2020-09-09 MED ORDER — RISPERIDONE 0.25 MG PO TABS: 0.2500 mg | ORAL_TABLET | Freq: Every day | ORAL | 1 refills | Status: DC

## 2020-09-09 NOTE — Progress Notes (Signed)
[facility]  Patient ID: Jeremy [name],  MRN: [id number]  PCP: Patient, No Pcp Per (Inactive)  Date of Evaluation: 09/09/2020 Time spent:20 minutes  Chief Complaint:  Chief Complaint    Anxiety; Depression      HISTORY/CURRENT STATUS: HPI For 6 month med check.  Had some problems in Dec when he was kind of blue. Had a lot of back pain, saw Dr. Deatra Robinson his Chiropractor, during that time, and is now better, both pain-wise and with mental health.   Able to enjoy things.  He enjoys gardening and is getting ramped up with that for the season.  Energy and motivation are good.  Very thankful that he is still able to do things like he wants.  Not isolating.  He enjoys the company of friends and especially his wife.  Not crying easily.  Sleeps well.  Denies increased anxiety.  No suicidal or homicidal thoughts.  Patient denies increased energy with decreased need for sleep, no increased talkativeness, no racing thoughts, no impulsivity or risky behaviors, no increased spending, no increased libido, no grandiosity, no increased irritability or anger, no paranoia, and no hallucinations.  Denies dizziness, syncope, seizures, numbness, tingling, tremor, tics, unsteady gait, slurred speech, confusion. Denies muscle or joint pain, stiffness, or dystonia.  Individual Medical History/ Review of Systems: Changes? :No   Past medications for mental health diagnoses include: (None listed in old chart)  Allergies: Aspergillus fumigatus, Aspirin, Sulfa antibiotics, and Theophyllines  Current Medications:  Current Outpatient Medications:  .  B Complex Vitamins (VITAMIN B COMPLEX) TABS, Take 1 tablet by mouth daily., Disp: , Rfl:  .  cholecalciferol (VITAMIN D3) 25 MCG (1000 UNIT) tablet, Take 1,000 Units by mouth daily., Disp: , Rfl:  .  finasteride (PROSCAR) 5 MG tablet, Take 5 mg by mouth every morning., Disp: , Rfl:  .  magnesium 30 MG tablet, Take 30 mg by mouth daily., Disp: , Rfl:  .   Multiple Vitamins-Minerals (MULTIVITAMIN WITH MINERALS) tablet, Take 1 tablet by mouth daily., Disp: , Rfl:  .  saw palmetto 160 MG capsule, Take 160 mg by mouth daily., Disp: , Rfl:  .  tamsulosin (FLOMAX) 0.4 MG CAPS capsule, Take 0.4 mg by mouth every evening., Disp: , Rfl:  .  Vitamins C E (VITAMIN C & E COMBINATION PO), Take 1 tablet by mouth daily., Disp: , Rfl:  .  ZINC SULFATE PO, Take 1 tablet by mouth daily., Disp: , Rfl:  .  calcium-vitamin D (OSCAL WITH D) 500-200 MG-UNIT per tablet, Take 1 tablet by mouth daily. (Patient not taking: Reported on 09/09/2020), Disp: , Rfl:  .  DULoxetine (CYMBALTA) 60 MG capsule, Take 2 capsules (120 mg total) by mouth every morning., Disp: 180 capsule, Rfl: 1 .  mirtazapine (REMERON) 30 MG tablet, Take 1 tablet (30 mg total) by mouth at bedtime., Disp: 90 tablet, Rfl: 1 .  risperiDONE (RISPERDAL) 0.25 MG tablet, Take 1 tablet (0.25 mg total) by mouth at bedtime., Disp: 90 tablet, Rfl: 1 Medication Side Effects: none  Family Medical/ Social History: Changes? No  MENTAL HEALTH EXAM:  There were no vitals taken for this visit.There is no height or weight on file to calculate BMI.  General Appearance: Casual, Neat and Well Groomed  Eye Contact:  Good  Speech:  Clear and Coherent and Normal Rate  Volume:  Normal  Mood:  Euthymic  Affect:  Appropriate  Thought Process:  Goal Directed and Descriptions of Associations: Intact  Orientation:  Full (Time, Place,  and Person)  Thought Content: Logical   Suicidal Thoughts:  No  Homicidal Thoughts:  No  Memory:  WNL  Judgement:  Good  Insight:  Good  Psychomotor Activity:  Normal  Concentration:  Concentration: Good  Recall:  Good  Fund of Knowledge: Good  Language: Good  Assets:  Desire for Improvement  ADL's:  Intact  Cognition: WNL  Prognosis:  Good    DIAGNOSES:    ICD-10-CM   1. Recurrent major depressive disorder, in remission ([place])  F33.40   2. Insomnia, unspecified type  G47.00      Receiving Psychotherapy: Yes Dr. Jonni Sanger Mitchum every 6 months or so.   RECOMMENDATIONS:  PDMP was reviewed. I provided 20 minutes of face-to-face time during this encounter, including time spent before and after the visit in records review and charting. He continues to do well on the current mental health medications so no changes will be made. Continue Cymbalta 60 mg, 2 p.o. every morning. Continue mirtazapine 30 mg, 1 p.o. nightly. Continue Risperdal 0.25 mg, 1 p.o. nightly. Continue therapy with Dr. [name]. Return in 6 months.   Donnal Moat, PA-C

## 2020-09-10 DIAGNOSIS — Z23 Encounter for immunization: Secondary | ICD-10-CM | POA: Diagnosis not present

## 2020-09-11 DIAGNOSIS — F3340 Major depressive disorder, recurrent, in remission, unspecified: Secondary | ICD-10-CM | POA: Insufficient documentation

## 2020-09-11 DIAGNOSIS — G4700 Insomnia, unspecified: Secondary | ICD-10-CM | POA: Insufficient documentation

## 2020-12-08 ENCOUNTER — Other Ambulatory Visit: Payer: Self-pay

## 2020-12-08 ENCOUNTER — Ambulatory Visit (INDEPENDENT_AMBULATORY_CARE_PROVIDER_SITE_OTHER): Payer: Medicare Other | Admitting: Psychiatry

## 2020-12-08 DIAGNOSIS — R69 Illness, unspecified: Secondary | ICD-10-CM

## 2020-12-08 DIAGNOSIS — F3340 Major depressive disorder, recurrent, in remission, unspecified: Secondary | ICD-10-CM | POA: Diagnosis not present

## 2020-12-08 DIAGNOSIS — Z8659 Personal history of other mental and behavioral disorders: Secondary | ICD-10-CM | POA: Diagnosis not present

## 2020-12-08 NOTE — Progress Notes (Signed)
Psychotherapy Progress Note [facility] [name], PhD LP  Patient ID: Jeremy [name]     MRN: [id number] Therapy format: Individual psychotherapy Date: 12/08/2020      Start: 2:18p     Stop: 3:05p     Time Spent: 47 min Location: In-person   Session narrative (presenting needs, interim history, self-report of stressors and symptoms, applications of prior therapy, status changes, and interventions made in session) Out of service a year -- notes he forgot to schedule for 6-mo f/u then.  Went through back problems Nov-June, and went through his usual winter down.  Recalls feeling like begging off a planned call with a friend in January, sent a note, was told "It's up to you", so he made the call anyway, and felt better.  Affirmed as example of the principles that you do normal, then you feel normal, and you get out of it what you put in.  Knows of a church mate who is in the aftermath of a stroke, neglected for pastoral care d/t pandemic and staff changes, and Shamir has put himself forward to organize a small community of care.  Affirmed and encouraged.  His [place] son [name] and wife are now residents in the [place] ([place], [place]).  Periman's wife completed the 6-year process to get a green card, and "miracle" to find suitable work for him, after help offered by [place] friends in residence fell through (finding unskilled labor jobs).  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    Acknowledges some mild slippage cognitively, but nothing concerning.  Maybe slightly more difficult to call up names and facts.  Remains in a longterm brain and memory study.  Feeling grateful overall.  Healthy at almost 85yo.    Very comfortable with psychiatrist, now 2 years into q 6 months service with her since his previous psychiatrist died unexpectedly.  Agreed to  continue with q 3 months mental health contact, for maintenance and prevention of serious relapse, interspersing medication and therapy checkins.  Briefly discussed potential depressors, e.g., if [name] passes first.    Therapeutic modalities: Cognitive Behavioral Therapy, Solution-Oriented/Positive Psychology, Customer service manager, and Faith-sensitive  Mental Status/Observations:  Appearance:   Casual, neat  Behavior:  Appropriate  Motor:  Normal  Speech/Language:   Clear and Coherent  Affect:  Appropriate  Mood:  normal  Thought process:  normal  Thought content:    WNL and mild worry  Sensory/Perceptual disturbances:    WNL  Orientation:  Fully oriented  Attention:  Good    Concentration:  Good  Memory:  grossly intact, no observed problems except brief name-finding  Insight:    Good  Judgment:   Good  Impulse Control:  Good   Risk Assessment: Danger to Self: No Self-injurious Behavior: No Danger to Others: No Physical Aggression / Violence: No Duty to Warn: No Access to Firearms a concern: No  Assessment of progress:  progressing  Diagnosis:   ICD-10-CM   1. Recurrent major depressive disorder, in remission ([place])  F33.40     2. History of psychosis  Z86.59     3. r/o ARCD  R69      Plan:  Continue range of good health habits Continue pushing through episodes of social reticence when they arise Continue efforts to engage and enjoy time with wife Continue uplifting volunteer activities Other recommendations/advice as may be noted above Continue  to utilize previously learned skills ad lib Maintain medication as prescribed and work faithfully with relevant prescriber(s) if any changes are desired or seem indicated Call the clinic on-call service, present to ER, or call 911 if any life-threatening psychiatric crisis Return in about 6 months (around 06/10/2021). Already scheduled visit in this office 03/10/2021.  Blanchie Serve, PhD [name], PhD LP Clinical  Psychologist, [facility] [address], [place], [place] [address] [phone]

## 2020-12-22 DIAGNOSIS — Z0000 Encounter for general adult medical examination without abnormal findings: Secondary | ICD-10-CM | POA: Diagnosis not present

## 2020-12-22 DIAGNOSIS — E7800 Pure hypercholesterolemia, unspecified: Secondary | ICD-10-CM | POA: Diagnosis not present

## 2020-12-22 DIAGNOSIS — E559 Vitamin D deficiency, unspecified: Secondary | ICD-10-CM | POA: Diagnosis not present

## 2020-12-22 DIAGNOSIS — E538 Deficiency of other specified B group vitamins: Secondary | ICD-10-CM | POA: Diagnosis not present

## 2021-01-02 DIAGNOSIS — R7309 Other abnormal glucose: Secondary | ICD-10-CM | POA: Diagnosis not present

## 2021-01-02 DIAGNOSIS — F339 Major depressive disorder, recurrent, unspecified: Secondary | ICD-10-CM | POA: Diagnosis not present

## 2021-01-02 DIAGNOSIS — E538 Deficiency of other specified B group vitamins: Secondary | ICD-10-CM | POA: Diagnosis not present

## 2021-01-02 DIAGNOSIS — M8580 Other specified disorders of bone density and structure, unspecified site: Secondary | ICD-10-CM | POA: Diagnosis not present

## 2021-01-02 DIAGNOSIS — R946 Abnormal results of thyroid function studies: Secondary | ICD-10-CM | POA: Diagnosis not present

## 2021-01-02 DIAGNOSIS — D649 Anemia, unspecified: Secondary | ICD-10-CM | POA: Diagnosis not present

## 2021-01-02 DIAGNOSIS — R972 Elevated prostate specific antigen [PSA]: Secondary | ICD-10-CM | POA: Diagnosis not present

## 2021-01-02 DIAGNOSIS — J302 Other seasonal allergic rhinitis: Secondary | ICD-10-CM | POA: Diagnosis not present

## 2021-01-02 DIAGNOSIS — Z5181 Encounter for therapeutic drug level monitoring: Secondary | ICD-10-CM | POA: Diagnosis not present

## 2021-01-02 DIAGNOSIS — N400 Enlarged prostate without lower urinary tract symptoms: Secondary | ICD-10-CM | POA: Diagnosis not present

## 2021-01-02 DIAGNOSIS — Z0000 Encounter for general adult medical examination without abnormal findings: Secondary | ICD-10-CM | POA: Diagnosis not present

## 2021-01-02 DIAGNOSIS — E559 Vitamin D deficiency, unspecified: Secondary | ICD-10-CM | POA: Diagnosis not present

## 2021-01-18 DIAGNOSIS — N401 Enlarged prostate with lower urinary tract symptoms: Secondary | ICD-10-CM | POA: Diagnosis not present

## 2021-01-18 DIAGNOSIS — R3912 Poor urinary stream: Secondary | ICD-10-CM | POA: Diagnosis not present

## 2021-01-18 DIAGNOSIS — R972 Elevated prostate specific antigen [PSA]: Secondary | ICD-10-CM | POA: Diagnosis not present

## 2021-02-27 DIAGNOSIS — Z23 Encounter for immunization: Secondary | ICD-10-CM | POA: Diagnosis not present

## 2021-03-10 ENCOUNTER — Ambulatory Visit (INDEPENDENT_AMBULATORY_CARE_PROVIDER_SITE_OTHER): Payer: Medicare Other | Admitting: Physician Assistant

## 2021-03-10 ENCOUNTER — Encounter: Payer: Self-pay | Admitting: Physician Assistant

## 2021-03-10 ENCOUNTER — Other Ambulatory Visit: Payer: Self-pay

## 2021-03-10 DIAGNOSIS — Z8659 Personal history of other mental and behavioral disorders: Secondary | ICD-10-CM | POA: Diagnosis not present

## 2021-03-10 DIAGNOSIS — G4700 Insomnia, unspecified: Secondary | ICD-10-CM

## 2021-03-10 DIAGNOSIS — F3340 Major depressive disorder, recurrent, in remission, unspecified: Secondary | ICD-10-CM

## 2021-03-10 MED ORDER — DULOXETINE HCL 60 MG PO CPEP: 120.0000 mg | ORAL_CAPSULE | Freq: Every morning | ORAL | 1 refills | Status: DC

## 2021-03-10 MED ORDER — RISPERIDONE 0.25 MG PO TABS: 0.2500 mg | ORAL_TABLET | Freq: Every day | ORAL | 1 refills | Status: DC

## 2021-03-10 MED ORDER — MIRTAZAPINE 30 MG PO TABS: 30.0000 mg | ORAL_TABLET | Freq: Every day | ORAL | 1 refills | Status: DC

## 2021-03-10 NOTE — Progress Notes (Signed)
[facility]  Patient ID: Jeremy [name],  MRN: [id number]  PCP: Patient, No Pcp Per (Inactive)  Date of Evaluation: 03/10/2021 Time spent:20 minutes  Chief Complaint:  Chief Complaint   Depression; Insomnia; Follow-up      HISTORY/CURRENT STATUS: HPI For 6 month med check.  Jeremy [name] is doing well except for some relationship issues that he does not go into.  Feels that his medications are working well and does not need any changes to be made.  He is able to enjoy things.  He continues to work out on a regular basis and also runs.  See social history.  Energy and motivation are good.  He was having some back pain for about 7 or 8 months but is now better since seeing his chiropractor and going on turmeric and fish oil.  Not isolating.  ADLs are within normal limits.  Sleeps well with the mirtazapine.  He does have some stress as noted above.  No suicidal or homicidal thoughts.  Denies dizziness, syncope, seizures, numbness, tingling, tremor, tics, unsteady gait, slurred speech, confusion. Denies muscle or joint pain, stiffness, or dystonia.  Individual Medical History/ Review of Systems: Changes? :Yes    H/A started this summer, has never had them before.  No neurologic deficits noted.  Plans to see his PCP about it soon.   Past medications for mental health diagnoses include: (None listed in old chart)  Allergies: Aspergillus fumigatus, Aspirin, Sulfa antibiotics, and Theophyllines  Current Medications:  Current Outpatient Medications:    B Complex Vitamins (VITAMIN B COMPLEX) TABS, Take 1 tablet by mouth daily., Disp: , Rfl:    cholecalciferol (VITAMIN D3) 25 MCG (1000 UNIT) tablet, Take 1,000 Units by mouth daily., Disp: , Rfl:    finasteride (PROSCAR) 5 MG tablet, Take 5 mg by mouth every morning., Disp: , Rfl:    magnesium 30 MG tablet, Take 30 mg by mouth daily., Disp: , Rfl:    Multiple Vitamins-Minerals (MULTIVITAMIN WITH MINERALS) tablet, Take 1 tablet by mouth  daily., Disp: , Rfl:    Omega-3 Fatty Acids (FISH OIL BURP-LESS PO), Take by mouth., Disp: , Rfl:    saw palmetto 160 MG capsule, Take 160 mg by mouth daily., Disp: , Rfl:    tamsulosin (FLOMAX) 0.4 MG CAPS capsule, Take 0.4 mg by mouth every evening., Disp: , Rfl:    Turmeric (QC TUMERIC COMPLEX PO), Take by mouth., Disp: , Rfl:    Vitamins C E (VITAMIN C & E COMBINATION PO), Take 1 tablet by mouth daily., Disp: , Rfl:    ZINC SULFATE PO, Take 1 tablet by mouth daily., Disp: , Rfl:    calcium-vitamin D (OSCAL WITH D) 500-200 MG-UNIT per tablet, Take 1 tablet by mouth daily. (Patient not taking: No sig reported), Disp: , Rfl:    DULoxetine (CYMBALTA) 60 MG capsule, Take 2 capsules (120 mg total) by mouth every morning., Disp: 180 capsule, Rfl: 1   mirtazapine (REMERON) 30 MG tablet, Take 1 tablet (30 mg total) by mouth at bedtime., Disp: 90 tablet, Rfl: 1   risperiDONE (RISPERDAL) 0.25 MG tablet, Take 1 tablet (0.25 mg total) by mouth at bedtime., Disp: 90 tablet, Rfl: 1 Medication Side Effects: none  Family Medical/ Social History: Changes? 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   MENTAL HEALTH EXAM:  There were no vitals taken for this visit.There is no height or weight on file to calculate BMI.  General Appearance: Casual, Neat and Well  Groomed  Eye Contact:  Good  Speech:  Clear and Coherent and Normal Rate  Volume:  Normal  Mood:  Euthymic  Affect:  Appropriate  Thought Process:  Goal Directed and Descriptions of Associations: Intact  Orientation:  Full (Time, Place, and Person)  Thought Content: Logical   Suicidal Thoughts:  No  Homicidal Thoughts:  No  Memory:  WNL  Judgement:  Good  Insight:  Good  Psychomotor Activity:  Normal  Concentration:  Concentration: Good and Attention Span: Good  Recall:  Good  Fund of Knowledge: Good  Language: Good  Assets:  Desire for Improvement  ADL's:  Intact  Cognition: WNL  Prognosis:  Good     DIAGNOSES:    ICD-10-CM   1. Recurrent major depressive disorder, in remission ([place])  F33.40     2. History of psychosis  Z86.59     3. Insomnia, unspecified type  G47.00       Receiving Psychotherapy: Yes Dr. Jonni Sanger Mitchum every 6 months or so.   RECOMMENDATIONS:  PDMP was reviewed.  No results are available. I provided 20 minutes of face to face time during this encounter, including time spent before and after the visit in records review, medical decision making, and charting.  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  I strongly recommend he see his PCP concerning the headaches.   He is doing well as far as his mental health meds go so no changes will be made. Continue Cymbalta 60 mg, 2 p.o. every morning. Continue mirtazapine 30 mg, 1 p.o. nightly. Continue Risperdal 0.25 mg, 1 p.o. nightly. Continue therapy with Dr. [name]. Return in 6 months.  Donnal Moat, PA-C

## 2021-03-16 DIAGNOSIS — Z23 Encounter for immunization: Secondary | ICD-10-CM | POA: Diagnosis not present

## 2021-03-30 ENCOUNTER — Other Ambulatory Visit: Payer: Self-pay

## 2021-03-30 ENCOUNTER — Ambulatory Visit (INDEPENDENT_AMBULATORY_CARE_PROVIDER_SITE_OTHER): Payer: Medicare Other | Admitting: Psychiatry

## 2021-03-30 DIAGNOSIS — F333 Major depressive disorder, recurrent, severe with psychotic symptoms: Secondary | ICD-10-CM

## 2021-03-30 DIAGNOSIS — F5104 Psychophysiologic insomnia: Secondary | ICD-10-CM | POA: Diagnosis not present

## 2021-03-30 NOTE — Progress Notes (Addendum)
Psychotherapy Progress Note [facility] [name], PhD LP  Patient ID: Jeremy [name] [facility])    MRN: [id number] Therapy format: Individual psychotherapy Date: 03/30/2021      Start: 2:15p     Stop: 3:05p     Time Spent: 50 min Location: In-person   Session narrative (presenting needs, interim history, self-report of stressors and symptoms, applications of prior therapy, status changes, and interventions made in session) Feels dreaded depression coming on as in times past, starting with "tension sickness" in his gut, then feeling it in his arms.  Had found that some relaxed breathing, on his back, was helping, but not after he gets particularly tense.  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  Had a touch of electrolyte imbalance the day of competition but resolved.  Just 2 weeks ago was telling psychiatry he's doing great, but pandemic, staff changes, and wife transitioned into more active involvement at church, all a "triple whammy" of changes that have signified loss of church and relationship.  Has been deeply affected, longterm, by Jeremy [name]'s tendency to get absorbed in her work.  Church itself has come to feel dead, partly attributed to pandemic.  Not happening now, but went through a bout of uncommon headaches recently he figures are part of his usually somatic depression, just not usually headaches.  Also learned belatedly how an icon of the church got wrecked with a stroke in 2020  Discussed efforts to get quality time with Jeremy [name], starting back to some partial workout this week, and the possibility that he is deficient in essential proteins due to restrictive diet.  Therapeutic modalities: Cognitive Behavioral Therapy, Solution-Oriented/Positive Psychology, and Ego-Supportive  Mental Status/Observations:  Appearance:   Casual and Neat     Behavior:  Appropriate   Motor:  Normal  Speech/Language:   Clear and Coherent  Affect:  Appropriate and Constricted  Mood:  anxious  Thought process:  normal  Thought content:    WNL  Sensory/Perceptual disturbances:    WNL, heightened somatic attention  Orientation:  Fully oriented  Attention:  Good    Concentration:  Good  Memory:  WNL  Insight:    Fair  Judgment:   Good  Impulse Control:  Good   Risk Assessment: Danger to Self: No Self-injurious Behavior: No Danger to Others: No Physical Aggression / Violence: No Duty to Warn: No Access to Firearms a concern: No  Assessment of progress:  situational setback(s)  Diagnosis:   ICD-10-CM   1. Major depressive disorder, recurrent episode with mood-congruent psychotic features ([place])  F33.3     2. Psychophysiological insomnia  F51.04      Plan:  Check with appropriate guide whether mostly vegetarian diet may run low on a crucial amino acid Consider genetic testing to see if MTHFR or another factor is undermining neurotransmitter synthesis Consider increasing risperidone temporarily -- say, double current dose -- and seek guidance from psychiatry, refer for work-in appt, authorize increase before seen, at psychiatry's option Continue efforts to restore exercise/movement  Continue efforts to socialize regardless of mood Continue efforts to solicit/arrange wife's companionship, whether it's shared recreation, shared charitable work, Social research officer, government. Other recommendations/advice as may be noted above Continue to utilize previously learned skills ad lib Maintain medication as prescribed and work faithfully with relevant prescriber(s) if any changes are desired or seem indicated Call the clinic on-call service, 988/hotline, present to ER, or call 911  if any life-threatening psychiatric crisis Return in about 2 weeks (around 04/13/2021) for time as available, put on cancellation list. Already scheduled visit in this office 06/09/2021.  Blanchie Serve, PhD [name], PhD LP Clinical Psychologist, [facility] [address] [place], [place] [address] [phone]

## 2021-03-31 ENCOUNTER — Telehealth: Payer: Self-pay | Admitting: Physician Assistant

## 2021-03-31 NOTE — Telephone Encounter (Signed)
Pt called back and given information to increase Risperidone to 0.5 mg at hs. He just picked up his 0.25 mg so he has plenty to take 2 tablets at hs for now. Will call back when needing a refill. He would also like the 04/11/2021 apt at 1:30 pm. No one had called yet but I did offer that time. Gave information to front staff and advised him if there were any issues with that they would call back.

## 2021-03-31 NOTE — Telephone Encounter (Signed)
Traci please let him know Dr. Rica Mote and I talked about his mood and I recommend him increasing the Risperdal 0.25 mg from 1 pill nightly to 2 pills nightly.  I think he has enough medications to do that before we see each other again, but if not I can send in a prescription for Risperdal 0.5 mg. Also let him know that I want him to come in soon and am asking Judson Roch to call him to set up an appointment.  Sarah please offer him 04/11/2021 at 1:30 or 04/18/2021 at 1:30 or 04/20/2021 at 3:30.  If he cannot be seen one of those times, please put him and the next available appointment and it is okay to put him in an urgent spot that is not already filled. Thank you both.

## 2021-03-31 NOTE — Telephone Encounter (Signed)
Left pt message to call back

## 2021-04-01 ENCOUNTER — Telehealth: Payer: Self-pay | Admitting: Psychiatry

## 2021-04-01 MED ORDER — LORAZEPAM 0.5 MG PO TABS: ORAL_TABLET | ORAL | 0 refills | Status: DC

## 2021-04-01 NOTE — Telephone Encounter (Signed)
Received after hours call from pt. He reports h/o severe depression and h/o seasonal depression.   "Been fine for 15 years on a cocktail of medications until about a week ago." He reports that "it took 3 years to find" combination of medications to help depression. He has been having early morning awakenings this week. He reports that he has to "stay busy" to stave off anxiety and panic. Anxious about wife being away for a period today.   He reports that last night he was in "total panic mode." He reports that he has felt "afraid... of another sickness and another hospitalization." Denies SI. He later reports   He increased Risperdal to 0.5 mg QHS last night around 8:30-9 pm and this helped him get to sleep. He awakened at 3 am this morning and had severe anxiety and sadness since it is 4 hours until daylight. He reports that he would like something to "sedate... calm me." He reports that increase last night was helpful. He reports that Risperdal typically "relaxes him." Denies physical restlessness.   He reports that he has difficulty concentrating and was not able to read or watch a movie. He reports that he has panic with increased heart rate and shortness of breath. He reports that anxiety starts as a "tension sickness" in his arms and "gut."  He reports that when he lays down he feels anxious.   Repeat one Risperdal at this time. Discussed potential benefits, risk, and side effects of benzodiazepines to include potential risk of tolerance and dependence, as well as possible drowsiness.  Advised patient not to drive if experiencing drowsiness and to take lowest possible effective dose to minimize risk of dependence and tolerance.   Patient advised to contact office with any questions, adverse effects, or acute worsening in signs and symptoms.

## 2021-04-03 NOTE — Telephone Encounter (Signed)
Thanks Janett Billow. Traci, please check on him.  Did he sleep better the past 2 nights?  If he is still feeling really anxious and depressed, have him increase the Risperdal up to 0.75 mg nightly, can even go up to a total of 1.0 mg if he tolerated that well without too much sedation early Saturday morning.  Continue Ativan as directed. As always, if suicidal go to [facility]. Thanks. FYI for [place].

## 2021-04-03 NOTE — Telephone Encounter (Signed)
Thank you Traci.

## 2021-04-03 NOTE — Telephone Encounter (Signed)
Rtc to pt and he reports feeling better today. Sleep has been improving, he reports adjusting the dose of Risperdal and I informed him if needed he could  increase to 1.0 mg. He was appreciative of the information and instructed to call back if needed. I also relayed information that Dr. Rica Mote could see him if he wanted to schedule an apt. He may call back to get that set up.

## 2021-04-07 ENCOUNTER — Ambulatory Visit (INDEPENDENT_AMBULATORY_CARE_PROVIDER_SITE_OTHER): Payer: Medicare Other | Admitting: Psychiatry

## 2021-04-07 ENCOUNTER — Other Ambulatory Visit: Payer: Self-pay

## 2021-04-07 DIAGNOSIS — F333 Major depressive disorder, recurrent, severe with psychotic symptoms: Secondary | ICD-10-CM

## 2021-04-07 DIAGNOSIS — F5104 Psychophysiologic insomnia: Secondary | ICD-10-CM | POA: Diagnosis not present

## 2021-04-07 NOTE — Progress Notes (Addendum)
Psychotherapy Progress Note [facility] [name], PhD LP  Patient ID: Jeremy [name] [facility])    MRN: [id number] Therapy format: Individual psychotherapy Date: 04/07/2021      Start: 3:21p     Stop: 4:10p     Time Spent: 49 min Location: In-person   Session narrative (presenting needs, interim history, self-report of stressors and symptoms, applications of prior therapy, status changes, and interventions made in session) Became terribly anxious last Friday, got advice to increase Risperdal.  Was not aware of call service, but in the wee hours Saturday, wife [name] made the decision to try the [facility] number to see if it could tell something, discovered the call service message.  "Thayer Headings was a godsend", really appreciates the help and coordination.  EHR shows followup advice about Risperdal dosing.  Risperdal usage this week ranging 0.5 - 0.75mg  per night, getting satisfactory sleep.  Aware he may go as high as 1.0 mg for the night, does not have to hold it in pieces to ration for multiple awakenings but may frontload up to the whole amount if it will get him a solid night's rest.  Did check into his nutrition and found that the pea protein he relies on for his smoothies is not complete for amino acids and has now restored.  Had all-normal CMP in late July, including normal B12.  Still clear the his cold-turkey stoppage of exercise routine after the [employer] [employer] (and his [place]) must have had some impact.  Good walk Saturday morning with [name] helped clear th bad night, very welcome both as exercise and for his feeling of chronically taking a back seat to whatever else she wants to do.  This week got back to some morning exercise and a 5-mi walk with wife.  Benefit of some together time.  For benefit of SAD, has followed advice to get up anyway, do a little something, and trust it works to stimulate his system, to join the sunrise, basically.    Looking ahead,  sees a long, lonely day shaping up Tuesday, when [name] will be working [employer].  Working on inviting a friend for time to break isolation, which he knows he typically needs any time he is flirting with psychotic depression.  Good time recently with one.  Affirmed and encouraged.  Realizes he is not simply perfectionistic, as he has known a long time, but in some measure "OCD".  Notes getting a cold, clammy feeling working through his torso when these episodes try to come.    Therapeutic modalities: Cognitive Behavioral Therapy, Solution-Oriented/Positive Psychology, Ego-Supportive, and Assertiveness/Communication  Mental Status/Observations:  Appearance:   Casual and Neat     Behavior:  Appropriate  Motor:  Normal  Speech/Language:   Clear and Coherent  Affect:  Appropriate  Mood:  normal and less anxious  Thought process:  normal  Thought content:    WNL  Sensory/Perceptual disturbances:    WNL  Orientation:  Fully oriented  Attention:  Good    Concentration:  Good  Memory:  WNL  Insight:    Good  Judgment:   Good  Impulse Control:  Good   Risk Assessment: Danger to Self: No Self-injurious Behavior: No Danger to Others: No Physical Aggression / Violence: No Duty to Warn: No Access to Firearms a concern: No  Assessment of progress:  progressing  Diagnosis:   ICD-10-CM   1. Major depressive disorder, recurrent episode with mood-congruent psychotic features ([place])  F33.3  2. Psychophysiological insomnia  F51.04      Plan:  Be fully willing to frontload antipsychotic at/before bedtime to attain solid sleep Monitor composition of diet to continue covering al necessary proteins Any exercise in reach, always helpful.   Extra helpful to get [employer] for it. Continue rising authoritatively for the day Advocate as needed for wife's company, taking care to focus less on complaining about her involvements and make it about missing her and wanting her help,  too Authoritatively arrange time with others as desired, to diversify and de-pressure wife for being most of his companionship, but advocate as needed for balance in time spent, don't just suffer silently if desired Other recommendations/advice as may be noted above Continue to utilize previously learned skills ad lib Maintain medication as prescribed and work faithfully with relevant prescriber(s) if any changes are desired or seem indicated Call the clinic on-call service, 988/hotline, present to ER, or call 911 if any life-threatening psychiatric crisis Return for time as available. Already scheduled visit in this office 04/11/2021.  Blanchie Serve, PhD [name], PhD LP Clinical Psychologist, [facility] [address] [place],  [id number] [phone]

## 2021-04-11 ENCOUNTER — Encounter: Payer: Self-pay | Admitting: Physician Assistant

## 2021-04-11 ENCOUNTER — Other Ambulatory Visit: Payer: Self-pay

## 2021-04-11 ENCOUNTER — Ambulatory Visit (INDEPENDENT_AMBULATORY_CARE_PROVIDER_SITE_OTHER): Payer: Medicare Other | Admitting: Physician Assistant

## 2021-04-11 DIAGNOSIS — F410 Panic disorder [episodic paroxysmal anxiety] without agoraphobia: Secondary | ICD-10-CM

## 2021-04-11 DIAGNOSIS — F3340 Major depressive disorder, recurrent, in remission, unspecified: Secondary | ICD-10-CM

## 2021-04-11 DIAGNOSIS — F411 Generalized anxiety disorder: Secondary | ICD-10-CM | POA: Diagnosis not present

## 2021-04-11 DIAGNOSIS — G4700 Insomnia, unspecified: Secondary | ICD-10-CM

## 2021-04-11 NOTE — Patient Instructions (Signed)
On the Risperdal 0.25 on the Risperdal 0.25 mg, take 3 pills at bedtime tonight and then if you need to take it 2 more times during the night that is just fine.  That would equal 5 pills per night.  Continue the Ativan and Cymbalta as you are already taking them.  Please do not be hesitant to take the Ativan when you need it.

## 2021-04-11 NOTE — Progress Notes (Signed)
[facility]  Patient ID: Jeremy [name],  MRN: [id number]  PCP: Patient, No Pcp Per (Inactive)  Date of Evaluation: 04/11/2021 Time spent:30 minutes  Chief Complaint:  Chief Complaint   Anxiety; Depression      HISTORY/CURRENT STATUS: HPI For 6 month med check.  He called about 3 weeks ago, started having 'gut sickness' and tightening in his forearms which he has experienced in this before, tension all over. Very anxious since then, Risperdal increased but he's only been taking the 0.25 mg at bedtime and then taking another 1 in the middle of the night if needed.  He has taken up to 3 pills as needed in the past few nights, but always taking the before bedtime dose.  Reports being anxious about being anxious, and afraid that he will become severely depressed and possibly psychotic like he has many years ago.  Its been almost 50 years however.  He is very anxious to be alone.  His wife is working with the poles for it election day today.  She will be gone approximately 14 hours.  He has scheduled things throughout the day to keep him busy, has met with 2 separate friends already today, had this appointment, and then will have another visit with a friend this afternoon.  He has tried to plan this so that he does not have any time of long to think things through too much.  He is nervous about taking the Ativan, has a healthy respect for the medication and ask if it is okay to take 1/2 pill twice a day if he needs it.  He is able to enjoy things as much as the anxiety will let him.  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  That was 1 month ago.  Having trouble sleeping, wakes up in a panic.  Called on call service and spoke with Thayer Headings, NP in the middle of the night a few weeks ago because of the severe anxiety.  He took another  Risperdal at that time as directed and that did help with the anxiety and help him go back to sleep.  He does not cry easily.  No feelings of hopelessness or worthlessness.  Again just afraid he might get back to that state.  No suicidal or homicidal thoughts.  Patient denies increased energy with decreased need for sleep, no increased talkativeness, no racing thoughts, no impulsivity or risky behaviors, no increased spending, no increased libido, no grandiosity, no increased irritability or anger, and no hallucinations.  Denies dizziness, syncope, seizures, numbness, tingling, tremor, tics, unsteady gait, slurred speech, confusion. Denies muscle or joint pain, stiffness, or dystonia.  Individual Medical History/ Review of Systems: Changes? :Yes    see HPI.  Past medications for mental health diagnoses include: (None listed in old chart)  Allergies: Aspergillus fumigatus, Aspirin, Sulfa antibiotics, and Theophyllines  Current Medications:  Current Outpatient Medications:    B Complex Vitamins (VITAMIN B COMPLEX) TABS, Take 1 tablet by mouth daily., Disp: , Rfl:    cholecalciferol (VITAMIN D3) 25 MCG (1000 UNIT) tablet, Take 1,000 Units by mouth daily., Disp: , Rfl:    DULoxetine (CYMBALTA) 60 MG capsule, Take 2 capsules (120 mg total) by mouth every morning., Disp: 180 capsule, Rfl: 1   finasteride (PROSCAR) 5 MG tablet, Take 5 mg by mouth every  morning., Disp: , Rfl:    LORazepam (ATIVAN) 0.5 MG tablet, Take 1/2-1 tablet twice daily as needed for severe anxiety/panic, Disp: 30 tablet, Rfl: 0   magnesium 30 MG tablet, Take 30 mg by mouth daily., Disp: , Rfl:    mirtazapine (REMERON) 30 MG tablet, Take 1 tablet (30 mg total) by mouth at bedtime., Disp: 90 tablet, Rfl: 1   Multiple Vitamins-Minerals (MULTIVITAMIN WITH MINERALS) tablet, Take 1 tablet by mouth daily., Disp: , Rfl:    Omega-3 Fatty Acids (FISH OIL BURP-LESS PO), Take by mouth., Disp: , Rfl:    risperiDONE (RISPERDAL) 0.25 MG  tablet, Take 1 tablet (0.25 mg total) by mouth at bedtime. (Patient taking differently: Take 0.75 mg by mouth at bedtime.), Disp: 90 tablet, Rfl: 1   saw palmetto 160 MG capsule, Take 160 mg by mouth daily., Disp: , Rfl:    tamsulosin (FLOMAX) 0.4 MG CAPS capsule, Take 0.4 mg by mouth every evening., Disp: , Rfl:    Turmeric (QC TUMERIC COMPLEX PO), Take by mouth., Disp: , Rfl:    ZINC SULFATE PO, Take 1 tablet by mouth daily., Disp: , Rfl:    calcium-vitamin D (OSCAL WITH D) 500-200 MG-UNIT per tablet, Take 1 tablet by mouth daily. (Patient not taking: Reported on 04/11/2021), Disp: , Rfl:    Vitamins C E (VITAMIN C & E COMBINATION PO), Take 1 tablet by mouth daily. (Patient not taking: Reported on 04/11/2021), Disp: , Rfl:  Medication Side Effects: none  Family Medical/ Social History: Changes? No  MENTAL HEALTH EXAM:  There were no vitals taken for this visit.There is no height or weight on file to calculate BMI.  General Appearance: Casual, Neat and Well Groomed  Eye Contact:  Good  Speech:  Clear and Coherent and Normal Rate  Volume:  Normal  Mood:  Euthymic  Affect:  Appropriate  Thought Process:  Goal Directed and Descriptions of Associations: Intact  Orientation:  Full (Time, Place, and Person)  Thought Content: Logical   Suicidal Thoughts:  No  Homicidal Thoughts:  No  Memory:  WNL  Judgement:  Good  Insight:  Good  Psychomotor Activity:  Normal  Concentration:  Concentration: Good and Attention Span: Good  Recall:  Good  Fund of Knowledge: Good  Language: Good  Assets:  Desire for Improvement  ADL's:  Intact  Cognition: WNL  Prognosis:  Good    DIAGNOSES:    ICD-10-CM   1. Panic attacks  F41.0     2. Recurrent major depressive disorder, in remission ([place])  F33.40     3. Generalized anxiety disorder  F41.1     4. Insomnia, unspecified type  G47.00        Receiving Psychotherapy: Yes Dr. Jonni Sanger Mitchum   RECOMMENDATIONS:  PDMP was reviewed.  Ativan filled  04/01/2021. I provided 30  minutes of face to face time during this encounter, including time spent before and after the visit in records review, medical decision making, counseling pertinent to today's visit, and charting.  We had a long discussion about his symptoms.  I recommend increasing the Risperdal up to a total of 0.75 mg nightly, and then may repeat 1 more pill if he wakes up anxious in the middle of the night. Also discussed the Ativan.  I respect his hesitancy to take the drug, but he is on the lowest dose it comes and and he definitely needs it right now.  We did discuss the possibility of tolerance, addiction, and side effects including  sedation.  He knows not to drive with it until he knows if it does cause drowsiness or confusion during the day.  Be aware that it would be like driving under the influence. Continue Ativan 0.5 mg, 1/2-1 p.o. twice daily as needed. Continue Cymbalta 60 mg, 2 p.o. every morning. Continue mirtazapine 30 mg, 1 p.o. nightly. Increase Risperdal as noted above. Continue therapy with Dr. [name]. Return in 2 weeks.  Donnal Moat, PA-C

## 2021-04-26 ENCOUNTER — Ambulatory Visit (INDEPENDENT_AMBULATORY_CARE_PROVIDER_SITE_OTHER): Payer: Medicare Other | Admitting: Physician Assistant

## 2021-04-26 ENCOUNTER — Other Ambulatory Visit: Payer: Self-pay

## 2021-04-26 ENCOUNTER — Encounter: Payer: Self-pay | Admitting: Physician Assistant

## 2021-04-26 DIAGNOSIS — F410 Panic disorder [episodic paroxysmal anxiety] without agoraphobia: Secondary | ICD-10-CM

## 2021-04-26 DIAGNOSIS — Z79899 Other long term (current) drug therapy: Secondary | ICD-10-CM | POA: Diagnosis not present

## 2021-04-26 DIAGNOSIS — G4700 Insomnia, unspecified: Secondary | ICD-10-CM

## 2021-04-26 DIAGNOSIS — F411 Generalized anxiety disorder: Secondary | ICD-10-CM | POA: Diagnosis not present

## 2021-04-26 DIAGNOSIS — F322 Major depressive disorder, single episode, severe without psychotic features: Secondary | ICD-10-CM | POA: Diagnosis not present

## 2021-04-26 MED ORDER — LITHIUM CARBONATE 150 MG PO CAPS: 150.0000 mg | ORAL_CAPSULE | Freq: Every evening | ORAL | 1 refills | Status: DC

## 2021-04-26 MED ORDER — LORAZEPAM 0.5 MG PO TABS: ORAL_TABLET | ORAL | 1 refills | Status: DC

## 2021-04-26 MED ORDER — RISPERIDONE 2 MG PO TABS: 2.0000 mg | ORAL_TABLET | Freq: Every day | ORAL | 1 refills | Status: DC

## 2021-04-26 NOTE — Progress Notes (Signed)
[facility]  Patient ID: Jeremy [name],  MRN: [id number]  PCP: Patient, No Pcp Per (Inactive)  Date of Evaluation: 04/26/2021 Time spent:40 minutes  Chief Complaint:  Chief Complaint   Anxiety; Depression; Insomnia; Follow-up       HISTORY/CURRENT STATUS: HPI For f/u, severe depression.  "I'm in trouble. I have mental anguish that's taking its toll." He doesn't think there is any help for him. Not having SI but "I wish my life was over, the easy way out." Nervous energy, has a hard time when he's not doing something, if he's busy he can focus on things. Has hopelessness, and then desparation about that.  He is very concerned about getting addicted to the Ativan.  Asked several times if it is safe to take 1 pill/day (0.5 mg).  He has increased the Risperdal taking 0.75 mg and then when he wakes up he takes 0.25 and sometimes 0.5 mg.  He is also concerned about the higher dose of that which is required now when he was only on 0.25 mg for months to years.  He does not like to be alone.  Personal hygiene is normal.  Does not cry easily.  He and his wife are going to their son's house for Thanksgiving.  They live in the [place] area and patient is very anxious about the drive.  No SI/HI.  Patient denies increased energy with decreased need for sleep, no increased talkativeness, no racing thoughts, no impulsivity or risky behaviors, no increased spending, no increased libido, no grandiosity, no increased irritability or anger, no paranoia, and no hallucinations.  Review of Systems  Constitutional: Negative.   HENT: Negative.    Eyes: Negative.   Respiratory: Negative.    Cardiovascular: Negative.   Gastrointestinal: Negative.   Genitourinary: Negative.   Musculoskeletal: Negative.   Skin: Negative.   Neurological: Negative.   Endo/Heme/Allergies: Negative.   Psychiatric/Behavioral:         See HPI.     Individual Medical History/ Review of Systems: Changes? :Yes    see  HPI.  Past medications for mental health diagnoses include: (None listed in old chart)  Was hospitalized many times since 1964, most recent was in 2005.   Allergies: Aspergillus fumigatus, Aspirin, Sulfa antibiotics, and Theophyllines  Current Medications:  Current Outpatient Medications:    B Complex Vitamins (VITAMIN B COMPLEX) TABS, Take 1 tablet by mouth daily., Disp: , Rfl:    cholecalciferol (VITAMIN D3) 25 MCG (1000 UNIT) tablet, Take 1,000 Units by mouth daily., Disp: , Rfl:    DULoxetine (CYMBALTA) 60 MG capsule, Take 2 capsules (120 mg total) by mouth every morning., Disp: 180 capsule, Rfl: 1   finasteride (PROSCAR) 5 MG tablet, Take 5 mg by mouth every morning., Disp: , Rfl:    lithium carbonate 150 MG capsule, Take 1 capsule (150 mg total) by mouth at bedtime., Disp: 30 capsule, Rfl: 1   magnesium 30 MG tablet, Take 30 mg by mouth daily., Disp: , Rfl:    mirtazapine (REMERON) 30 MG tablet, Take 1 tablet (30 mg total) by mouth at bedtime., Disp: 90 tablet, Rfl: 1   Multiple Vitamins-Minerals (MULTIVITAMIN WITH MINERALS) tablet, Take 1 tablet by mouth daily., Disp: , Rfl:    Omega-3 Fatty Acids (FISH OIL BURP-LESS PO), Take by mouth., Disp: , Rfl:    risperiDONE (RISPERDAL) 2 MG tablet, Take 1 tablet (2 mg total) by mouth at bedtime. Take 1 po qhs.  If wakes up in middle of night, may take  1/2 pill (1mg ), Disp: 30 tablet, Rfl: 1   saw palmetto 160 MG capsule, Take 160 mg by mouth daily., Disp: , Rfl:    tamsulosin (FLOMAX) 0.4 MG CAPS capsule, Take 0.4 mg by mouth every evening., Disp: , Rfl:    Turmeric (QC TUMERIC COMPLEX PO), Take by mouth., Disp: , Rfl:    ZINC SULFATE PO, Take 1 tablet by mouth daily., Disp: , Rfl:    calcium-vitamin D (OSCAL WITH D) 500-200 MG-UNIT per tablet, Take 1 tablet by mouth daily. (Patient not taking: Reported on 04/11/2021), Disp: , Rfl:    LORazepam (ATIVAN) 0.5 MG tablet, Take 1/2-1 tablet twice daily as needed for severe anxiety/panic, Disp: 60  tablet, Rfl: 1   Vitamins C E (VITAMIN C & E COMBINATION PO), Take 1 tablet by mouth daily. (Patient not taking: Reported on 04/11/2021), Disp: , Rfl:  Medication Side Effects: none  Family Medical/ Social History: Changes? No  MENTAL HEALTH EXAM:  There were no vitals taken for this visit.There is no height or weight on file to calculate BMI.  General Appearance: Casual, Neat and Well Groomed  Eye Contact:  Good  Speech:  Clear and Coherent and Normal Rate  Volume:  Normal  Mood:  Anxious, Depressed, and Hopeless  Affect:  Depressed and Anxious  Thought Process:  Goal Directed and Descriptions of Associations: Intact  Orientation:  Full (Time, Place, and Person)  Thought Content: Logical, Obsessions, and Rumination   Suicidal Thoughts:  Yes.  without intent/plan  Homicidal Thoughts:  No  Memory:  WNL  Judgement:  Good  Insight:  Good  Psychomotor Activity:  Normal  Concentration:  Concentration: Good and Attention Span: Good  Recall:  Good  Fund of Knowledge: Good  Language: Good  Assets:  Desire for Improvement  ADL's:  Intact  Cognition: WNL  Prognosis:  Good    DIAGNOSES:    ICD-10-CM   1. Severe depression ([facility])  F32.2 Lithium level    CBC with Differential/Platelet    TSH    Comprehensive metabolic panel    2. Generalized anxiety disorder  F41.1     3. Panic attacks  F41.0     4. Insomnia, unspecified type  G47.00     5. Encounter for long-term (current) use of medications  Z79.899 Lithium level    CBC with Differential/Platelet    TSH    Comprehensive metabolic panel        Receiving Psychotherapy: Yes Dr. Jonni Sanger Mitchum   RECOMMENDATIONS:  PDMP was reviewed.  Ativan filled 04/01/2021. I provided 40 minutes of face to face time during this encounter, including time spent before and after the visit in records review, medical decision making, counseling pertinent to today's visit, and charting.  We discussed the passive suicidal thoughts.  Contract for  safety is in place, he knows to call [phone], our office, or go to [facility], ER if the thoughts should worsen and/or he has a plan for suicide. Lithium at a low dose can help suicidal thoughts.  I am not able to see recent lab results but he has had some recent weight that were within normal limits.  We discussed lithium pros and cons including thyroid abnormalities and abnormal kidney function.  He verbalizes understanding and accepts.  We will check labs sometime within the next week. Also recommend increasing the Risperdal.  I reassured him that what he has been on is a very low dose so even increasing it is not a high dose relatively  to the maximum recommended dose.   Again reassured him that taking Ativan is safe and needed right now.  I am not at all worried about addiction at this point. If he wakes up during the middle of the night he can do 1 of 2 things, first take 1/2 pill of the Risperdal which would equal 3 mg total per night.  Or take 1 pill of the Ativan to help him go back to sleep. Start lithium 150 mg, 1 p.o. nightly. Continue Ativan 0.5 mg, 1/2-1 p.o. twice daily as needed. Continue Cymbalta 60 mg, 2 p.o. every morning. Continue mirtazapine 30 mg, 1 p.o. nightly. Increase Risperdal to 2 mg p.o. nightly. Labs to be drawn next week. Continue therapy with Dr. [name]. Return in 2 weeks.  Donnal Moat, PA-C

## 2021-04-26 NOTE — Patient Instructions (Addendum)
We're changing the Risperdal to a 2 mg pill.  Take 1 at bedtime.  If you wake up during the middle of the night and are anxious then you have the option of taking 1 whole pill of the Ativan 0.25 mg, or 1/2 pill of the Risperdal which would equal 1 mg of that.  Have your labs drawn 1 day next week.  You do not have to be fasting.

## 2021-05-04 DIAGNOSIS — Z79899 Other long term (current) drug therapy: Secondary | ICD-10-CM | POA: Diagnosis not present

## 2021-05-04 DIAGNOSIS — F322 Major depressive disorder, single episode, severe without psychotic features: Secondary | ICD-10-CM | POA: Diagnosis not present

## 2021-05-05 ENCOUNTER — Telehealth: Payer: Self-pay | Admitting: Behavioral Health

## 2021-05-05 LAB — COMPREHENSIVE METABOLIC PANEL
ALT: 13 IU/L (ref 0–44)
AST: 20 IU/L (ref 0–40)
Albumin/Globulin Ratio: 2.2 (ref 1.2–2.2)
Albumin: 4.6 g/dL (ref 3.6–4.6)
Alkaline Phosphatase: 65 IU/L (ref 44–121)
BUN/Creatinine Ratio: 17 (ref 10–24)
BUN: 15 mg/dL (ref 8–27)
Bilirubin Total: 0.2 mg/dL (ref 0.0–1.2)
CO2: 24 mmol/L (ref 20–29)
Calcium: 9.3 mg/dL (ref 8.6–10.2)
Chloride: 94 mmol/L — ABNORMAL LOW (ref 96–106)
Creatinine, Ser: 0.9 mg/dL (ref 0.76–1.27)
Globulin, Total: 2.1 g/dL (ref 1.5–4.5)
Glucose: 106 mg/dL — ABNORMAL HIGH (ref 70–99)
Potassium: 4.3 mmol/L (ref 3.5–5.2)
Sodium: 131 mmol/L — ABNORMAL LOW (ref 134–144)
Total Protein: 6.7 g/dL (ref 6.0–8.5)
eGFR: 85 mL/min/{1.73_m2} (ref >59)

## 2021-05-05 LAB — CBC WITH DIFFERENTIAL/PLATELET
Basophils Absolute: 0 10*3/uL (ref 0.0–0.2)
Basos: 1 %
EOS (ABSOLUTE): 0.1 10*3/uL (ref 0.0–0.4)
Eos: 1 %
Hematocrit: 40 % (ref 37.5–51.0)
Hemoglobin: 14 g/dL (ref 13.0–17.7)
Immature Grans (Abs): 0 10*3/uL (ref 0.0–0.1)
Immature Granulocytes: 0 %
Lymphocytes Absolute: 1 10*3/uL (ref 0.7–3.1)
Lymphs: 12 %
MCH: 32.1 pg (ref 26.6–33.0)
MCHC: 35 g/dL (ref 31.5–35.7)
MCV: 92 fL (ref 79–97)
Monocytes Absolute: 0.7 10*3/uL (ref 0.1–0.9)
Monocytes: 8 %
Neutrophils Absolute: 6.4 10*3/uL (ref 1.4–7.0)
Neutrophils: 78 %
Platelets: 277 10*3/uL (ref 150–450)
RBC: 4.36 x10E6/uL (ref 4.14–5.80)
RDW: 11.6 % (ref 11.6–15.4)
WBC: 8.3 10*3/uL (ref 3.4–10.8)

## 2021-05-05 LAB — LITHIUM LEVEL: Lithium Lvl: 0.1 mmol/L — ABNORMAL LOW (ref 0.5–1.2)

## 2021-05-05 LAB — TSH: TSH: 2.91 u[IU]/mL (ref 0.450–4.500)

## 2021-05-07 NOTE — Progress Notes (Signed)
Please let him know Lithium level is low, but that's expected. No change in dose. CBC is great.  TSH is normal  CMP, kidney functions, liver function are nl.  Sodium and chloride are a little low, not alarming but I do recommend he drink 1-2 glasses of gatorade or powerade daily. I'll discuss with him at appt on 12/6.

## 2021-05-08 NOTE — Progress Notes (Signed)
LVM to RC 

## 2021-05-09 ENCOUNTER — Other Ambulatory Visit: Payer: Self-pay

## 2021-05-09 ENCOUNTER — Ambulatory Visit (INDEPENDENT_AMBULATORY_CARE_PROVIDER_SITE_OTHER): Payer: Medicare Other | Admitting: Physician Assistant

## 2021-05-09 ENCOUNTER — Encounter: Payer: Self-pay | Admitting: Physician Assistant

## 2021-05-09 DIAGNOSIS — Z79899 Other long term (current) drug therapy: Secondary | ICD-10-CM

## 2021-05-09 DIAGNOSIS — E871 Hypo-osmolality and hyponatremia: Secondary | ICD-10-CM

## 2021-05-09 DIAGNOSIS — F411 Generalized anxiety disorder: Secondary | ICD-10-CM | POA: Diagnosis not present

## 2021-05-09 DIAGNOSIS — F322 Major depressive disorder, single episode, severe without psychotic features: Secondary | ICD-10-CM

## 2021-05-09 NOTE — Progress Notes (Signed)
Patient was seen in the office today. Jeremy [name] had already discussed labs with him. Reviewed recommendations again with him.

## 2021-05-09 NOTE — Progress Notes (Signed)
[facility]  Patient ID: Jeremy [name],  MRN: [id number]  PCP: Patient, No Pcp Per (Inactive)  Date of Evaluation: 05/09/2021 Time spent:40 minutes  Chief Complaint:  Chief Complaint   Depression; Anxiety; Follow-up     HISTORY/CURRENT STATUS: HPI For f/u, severe depression.  Still very depressed. Very concerned that he is not getting better.  Two weeks ago, Risperdal was increased and low-dose lithium was started.  He has been extremely nervous about taking the lithium.  "I wish I had never let you talk me into taking the lithium."  He feels really blue.  "I do not think there is anything to help me."  His energy and motivation are lacking.  Having trouble enjoying things.  He and his wife did go see his son and his family over Thanksgiving, but because he was so anxious and depressed, they came home a day early.  He wants to stay in bed a lot but does not allow himself to he is isolating.  He does not like to be alone though, prefers his wife to be with him.  He does not cry easily.  He does not want to be alive anymore just because he does not think the depression will improve.  States he is not going to hurt himself in any way but just wants the mental anguish to be done with.  No homicidal thoughts.  We also increased the Risperdal at the last visit 2 weeks ago.  Reports he is not quite as anxious.  He is taking the Ativan when needed but is still reluctant to do so.  Afraid he will get addicted.  Not having panic attacks but feels overwhelmed a lot of the time.  Patient denies increased energy with decreased need for sleep, no increased talkativeness, no racing thoughts, no impulsivity or risky behaviors, no increased spending, no increased libido, no grandiosity, no increased irritability or anger, and no hallucinations.  Review of Systems  Constitutional:  Positive for malaise/fatigue.  HENT: Negative.    Eyes: Negative.   Respiratory: Negative.    Cardiovascular:  Negative.   Gastrointestinal: Negative.   Genitourinary: Negative.   Musculoskeletal: Negative.   Skin: Negative.   Neurological: Negative.   Endo/Heme/Allergies: Negative.   Psychiatric/Behavioral:         See HPI.    Individual Medical History/ Review of Systems: Changes? :No    Past medications for mental health diagnoses include: (None listed in old chart)  Was hospitalized many times since 1964, most recent was in 2005.   Allergies: Aspergillus fumigatus, Aspirin, Sulfa antibiotics, and Theophyllines  Current Medications:  Current Outpatient Medications:    B Complex Vitamins (VITAMIN B COMPLEX) TABS, Take 1 tablet by mouth daily., Disp: , Rfl:    calcium-vitamin D (OSCAL WITH D) 500-200 MG-UNIT per tablet, Take 1 tablet by mouth daily. (Patient not taking: Reported on 04/11/2021), Disp: , Rfl:    cholecalciferol (VITAMIN D3) 25 MCG (1000 UNIT) tablet, Take 1,000 Units by mouth daily., Disp: , Rfl:    DULoxetine (CYMBALTA) 60 MG capsule, Take 2 capsules (120 mg total) by mouth every morning., Disp: 180 capsule, Rfl: 1   finasteride (PROSCAR) 5 MG tablet, Take 5 mg by mouth every morning., Disp: , Rfl:    LORazepam (ATIVAN) 0.5 MG tablet, Take 1/2-1 tablet twice daily as needed for severe anxiety/panic, Disp: 60 tablet, Rfl: 1   magnesium 30 MG tablet, Take 30 mg by mouth daily., Disp: , Rfl:    mirtazapine (REMERON) 30  MG tablet, Take 1 tablet (30 mg total) by mouth at bedtime., Disp: 90 tablet, Rfl: 1   Multiple Vitamins-Minerals (MULTIVITAMIN WITH MINERALS) tablet, Take 1 tablet by mouth daily., Disp: , Rfl:    Omega-3 Fatty Acids (FISH OIL BURP-LESS PO), Take by mouth., Disp: , Rfl:    risperiDONE (RISPERDAL) 2 MG tablet, Take 1 tablet (2 mg total) by mouth at bedtime. Take 1 po qhs.  If wakes up in middle of night, may take 1/2 pill (1mg ), Disp: 30 tablet, Rfl: 1   saw palmetto 160 MG capsule, Take 160 mg by mouth daily., Disp: , Rfl:    tamsulosin (FLOMAX) 0.4 MG CAPS  capsule, Take 0.4 mg by mouth every evening., Disp: , Rfl:    Turmeric (QC TUMERIC COMPLEX PO), Take by mouth., Disp: , Rfl:    Vitamins C E (VITAMIN C & E COMBINATION PO), Take 1 tablet by mouth daily. (Patient not taking: Reported on 04/11/2021), Disp: , Rfl:    ZINC SULFATE PO, Take 1 tablet by mouth daily., Disp: , Rfl:  Medication Side Effects: none  Family Medical/ Social History: Changes? No  MENTAL HEALTH EXAM:  There were no vitals taken for this visit.There is no height or weight on file to calculate BMI.  General Appearance: Casual, Neat and Well Groomed  Eye Contact:  Good  Speech:  Clear and Coherent and Normal Rate  Volume:  Normal  Mood:  Anxious and Depressed  Affect:  Depressed and Anxious  Thought Process:  Goal Directed and Descriptions of Associations: Circumstantial  Orientation:  Full (Time, Place, and Person)  Thought Content: Logical, Obsessions, and Rumination   Suicidal Thoughts:  Yes.  without intent/plan  Homicidal Thoughts:  No  Memory:  WNL  Judgement:  Good  Insight:  Good  Psychomotor Activity:  Normal  Concentration:  Concentration: Good and Attention Span: Good  Recall:  Good  Fund of Knowledge: Good  Language: Good  Assets:  Desire for Improvement  ADL's:  Intact  Cognition: WNL  Prognosis:  Good   Labs 05/04/2021 CBC within normal limits. CMP glucose 106, sodium 131, chloride 94, kidney and liver functions within normal limits Lithium level low at 0.1. TSH 2.9  Labs 12/30/2020, results he brought in to me today. CBC hemoglobin 12.9 but all other values within normal limits. CMP sodium was 134.  All other values on CMP were normal.   B12 normal Lipid panel cholesterol 209, HDL 108, LDL 91, triglycerides 48. Vitamin D level 43.6  DIAGNOSES:    ICD-10-CM   1. Severe depression ([place])  F32.2     2. Generalized anxiety disorder  F41.1     3. Hyponatremia  X38.1 Basic metabolic panel    4. Encounter for long-term (current) use of  medications  W29.937 Basic metabolic panel         Receiving Psychotherapy: Yes Dr. Jonni Sanger Mitchum   RECOMMENDATIONS:  PDMP was reviewed.  Ativan filled 04/26/2021.   I provided 40 minutes of face to face time during this encounter, including time spent before and after the visit in records review, medical decision making, counseling pertinent to today's visit, and charting.  He really does not want to stay on the lithium so we will stop that. There are several other medications that can be used for depression, or increasing the Risperdal is appropriate as well.  He does not want to change anything even though I strongly recommend we do so.  "I want to try for another month  and see how I do."   Contract for safety is in place.  Call our office, [phone], or go to [facility] behavioral health urgent care if the suicidal thoughts worsen and he has a plan to hurt himself.  He verbalizes understanding. We discussed the low sodium.  Recommend repeating BMP in a few weeks. Discontinue the Lithium. Continue Ativan 0.5 mg, 1/2-1 p.o. twice daily as needed. Continue Cymbalta 60 mg, 2 p.o. every morning. Continue mirtazapine 30 mg, 1 p.o. nightly. Continue Risperdal 2 mg p.o. nightly. Continue B complex, vitamin D, magnesium, multivitamin, fish oil, saw palmetto, turmeric Continue therapy with Dr. [name].  He will be worked in the soon as possible. Return in 4 weeks.  Donnal Moat, PA-C

## 2021-05-09 NOTE — Progress Notes (Signed)
Left VM to RC

## 2021-06-02 ENCOUNTER — Ambulatory Visit (INDEPENDENT_AMBULATORY_CARE_PROVIDER_SITE_OTHER): Payer: Medicare Other | Admitting: Psychiatry

## 2021-06-02 ENCOUNTER — Other Ambulatory Visit: Payer: Self-pay

## 2021-06-02 DIAGNOSIS — F322 Major depressive disorder, single episode, severe without psychotic features: Secondary | ICD-10-CM | POA: Diagnosis not present

## 2021-06-02 DIAGNOSIS — Z9189 Other specified personal risk factors, not elsewhere classified: Secondary | ICD-10-CM

## 2021-06-02 DIAGNOSIS — F411 Generalized anxiety disorder: Secondary | ICD-10-CM

## 2021-06-02 NOTE — Progress Notes (Signed)
Psychotherapy Progress Note [facility] [name], PhD LP  Patient ID: Jeremy [name] [facility])    MRN: [id number] Therapy format: Family therapy w/ patient -- accompanied by Jeremy [name] Date: 06/02/2021      Start: 8:15a     Stop: 9:15a     Time Spent: 60 min Location: In-person   Session narrative (presenting needs, interim history, self-report of stressors and symptoms, applications of prior therapy, status changes, and interventions made in session) Seen individually, joined later by wife Jeremy [name].  Yesterday accepted latest outreach offer to fill a cancellation spot, previously offered several times in November and December.  Says he was hounded by wife Jeremy [name] to accept this time.  Arrives neatly and casually dressed and ambulatory, though he reports severe depression, fear he is losing the ability to walk.  Admits with shame being "addicted" to being in bed.  Hates himself.  Been turning down or just nonresponding to offers to see people.  Feels he will be unable to walk tomorrow for being bed "sore".  C/o all joints hurting.  Says Jeremy [name] "accuses" him of not wanting to get well, but that seems suspect.  Feels like a "fair weather Darrick Meigs" for not participating in Christmas, etc., being a burden to [place], other things.   Not driving since Thanksgiving, doesn't trust his reflexes, tremor, and upper back.  Admits he has fallen off his usual good nutrition, encompassing greens, vegetables, seeds, legumes, whole grains, and smoothies; last two months all bagels, granola, trail mix, coffee, he says.  Smoothie machine wore out, not repaired/replaced.  Not clear he ever addressed the suspected issue of amino acid deficiency, and now claims 4 weeks of constipation.  Tried Colace maybe a month ago, worked once, gave up.  Magnesium something didn't work, either.  Sought an enema Friday, but found it had been recalled.  Feels full up to diaphragm.  Christy Sartorius in, who confirms he has been  inactive, demurring on seeing people, and they have been trying to address constipation.  Call in to PCP, which corben auzenne will not be returned, it's what happened in the spring with another issue.  Re. constipation, Jeremy [name] says he gave up too easily.  Blood test had shown modestly low sodium, doctor's advice coconut water but he won't drink it.  Admits she does get frustrated, and she asked him if he doesn't want to get well.  Tried Dulcolax but gave up too easily.  Advised Miralax daily.  She suspects a sodium issue, based on blood test.  Confesses not eating much at all and maybe dehydrating, though Jeremy [name] thinks he does get enough water.  Just in the last couple days have gotten in some walking, est a couple miles each of two days, together.    Confronted importance of addressing bodily needs over worrying about spiritual, moral, or even relational concerns.  Confronted getting hung up on having to keep up appearances (e.g., couldn't let a friend see him like this).  Consulted regular psychiatrist next door for appropriate physical advice, was unaware of constipation, muscle/joint aches, and time in bed.  Possibility higher Risperdal dose is constipating but for now important to maintain antianxiety and sleep reinforcement while heading off potential bowel blockage.  Made clear to both that all questions of worth, hopelessness, moral standing, etc. can be tabled right now, priority is on the body, with emphasis on bowel motility, hydration, adequate movement, and getting balanced nutrition back in when able.    Therapeutic modalities: Cognitive  Behavioral Therapy, Solution-Oriented/Positive Psychology, and health maintenance  Mental Status/Observations:  Appearance:   Casual and Neat     Behavior:  Somewhat shaken  Motor:  Normal and mild tremor noted  Speech/Language:   Clear, fluent, some halting  Affect:  Constricted  Mood:  depressed  Thought process:  ruminative  Thought content:     Rumination  Sensory/Perceptual disturbances:    WNL  Orientation:  grossly intact  Attention:  Good    Concentration:  Fair  Memory:  grossly intact  Insight:    Fair  Judgment:   Fair  Impulse Control:  Fair   Risk Assessment: Danger to Self: No Self-injurious Behavior:  unintentional neglect Danger to Others: No Physical Aggression / Violence: No Duty to Warn: No Access to Firearms a concern: No  Assessment of progress:  deteriorating -- in need of reassessment  Diagnosis:   ICD-10-CM   1. Severe depression ([place])  F32.2     2. Generalized anxiety disorder  F41.1     3. Potential for self care deficit  Z91.89      Plan:  Seek to table all worries about questions of worth, hopelessness, moral standing, relationship, or keeping up appearances; priority is on physical health Address constipation with Miralax daily x 3-4 days, then leaner schedule; confirm with physician and enact any medical recommendations including urgent service to prevent bowel blockage if indicated Ensure adequate hydration Daily movement of some kind, even if it's slow walking Wife may engage friends to visit, walk together just for stimulation, with or without Jhovany's prior blessing When able to eat further, restore more of the balance and vegetable matter Caidin was accustomed to before breakdown Engage weekly for therapy Approach hospital  Other recommendations/advice as may be noted above Continue to utilize previously learned skills ad lib Maintain medication as prescribed and work faithfully with relevant prescriber(s) if any changes are desired or seem indicated Call the clinic on-call service, 988/hotline, 911, or present to [facility] or ER if any life-threatening psychiatric crisis Return in about 1 week (around 06/09/2021) for session(s) already scheduled. Already scheduled visit in this office 06/09/2021.  Blanchie Serve, PhD [name], PhD LP Clinical Psychologist, [facility]  Group [facility] [address] [place], [place] [address] [phone]

## 2021-06-04 ENCOUNTER — Other Ambulatory Visit: Payer: Self-pay

## 2021-06-04 ENCOUNTER — Encounter (HOSPITAL_COMMUNITY): Payer: Self-pay | Admitting: Emergency Medicine

## 2021-06-04 ENCOUNTER — Emergency Department (HOSPITAL_COMMUNITY): Payer: Medicare Other

## 2021-06-04 ENCOUNTER — Emergency Department (HOSPITAL_COMMUNITY)
Admission: EM | Admit: 2021-06-04 | Discharge: 2021-06-04 | Disposition: A | Discharge status: Home / Self Care | Payer: Medicare Other | Attending: Emergency Medicine | Admitting: Emergency Medicine

## 2021-06-04 DIAGNOSIS — R188 Other ascites: Secondary | ICD-10-CM | POA: Diagnosis not present

## 2021-06-04 DIAGNOSIS — I700 Atherosclerosis of aorta: Secondary | ICD-10-CM | POA: Diagnosis not present

## 2021-06-04 DIAGNOSIS — K5900 Constipation, unspecified: Secondary | ICD-10-CM | POA: Diagnosis not present

## 2021-06-04 LAB — COMPREHENSIVE METABOLIC PANEL
ALT: 21 U/L (ref 0–44)
AST: 24 U/L (ref 15–41)
Albumin: 4 g/dL (ref 3.5–5.0)
Alkaline Phosphatase: 52 U/L (ref 38–126)
Anion gap: 11 (ref 5–15)
BUN: 9 mg/dL (ref 8–23)
CO2: 28 mmol/L (ref 22–32)
Calcium: 9.3 mg/dL (ref 8.9–10.3)
Chloride: 97 mmol/L — ABNORMAL LOW (ref 98–111)
Creatinine, Ser: 1.02 mg/dL (ref 0.61–1.24)
GFR, Estimated: >60 mL/min (ref >60)
Glucose, Bld: 123 mg/dL — ABNORMAL HIGH (ref 70–99)
Potassium: 3.9 mmol/L (ref 3.5–5.1)
Sodium: 136 mmol/L (ref 135–145)
Total Bilirubin: 1.1 mg/dL (ref 0.3–1.2)
Total Protein: 6.4 g/dL — ABNORMAL LOW (ref 6.5–8.1)

## 2021-06-04 LAB — URINALYSIS, ROUTINE W REFLEX MICROSCOPIC
Bilirubin Urine: NEGATIVE
Glucose, UA: NEGATIVE mg/dL
Hgb urine dipstick: NEGATIVE
Ketones, ur: 20 mg/dL — AB
Leukocytes,Ua: NEGATIVE
Nitrite: NEGATIVE
Protein, ur: NEGATIVE mg/dL
Specific Gravity, Urine: 1.006 (ref 1.005–1.030)
pH: 6 (ref 5.0–8.0)

## 2021-06-04 LAB — CBC
HCT: 41.9 % (ref 39.0–52.0)
Hemoglobin: 14.7 g/dL (ref 13.0–17.0)
MCH: 32.7 pg (ref 26.0–34.0)
MCHC: 35.1 g/dL (ref 30.0–36.0)
MCV: 93.3 fL (ref 80.0–100.0)
Platelets: 223 10*3/uL (ref 150–400)
RBC: 4.49 MIL/uL (ref 4.22–5.81)
RDW: 11.8 % (ref 11.5–15.5)
WBC: 4 10*3/uL (ref 4.0–10.5)
nRBC: 0 % (ref 0.0–0.2)

## 2021-06-04 LAB — LIPASE, BLOOD: Lipase: 33 U/L (ref 11–51)

## 2021-06-04 MED ORDER — POLYETHYLENE GLYCOL 3350 17 G PO PACK: 17.0000 g | PACK | Freq: Two times a day (BID) | ORAL | 0 refills | Status: DC

## 2021-06-04 MED ORDER — IOHEXOL 300 MG/ML  SOLN
100.0000 mL | Freq: Once | INTRAMUSCULAR | Status: AC | PRN
  Administered 2021-06-04: 100 mL via INTRAVENOUS

## 2021-06-04 MED ORDER — SORBITOL 70 % SOLN
960.0000 mL | TOPICAL_OIL | Freq: Once | ORAL | Status: AC
  Administered 2021-06-04: 960 mL via RECTAL
  Filled 2021-06-04: qty 473

## 2021-06-04 NOTE — ED Triage Notes (Signed)
Pt reports constipation x 4 weeks.  States he believes he is impacted and hasn't had a BM in 4 weeks.  Denies abd pain but reports "fullness".  Also reports decreased urination and states he believes he is dehydrated.  States he has a lot of flatulence.

## 2021-06-04 NOTE — ED Provider Notes (Signed)
[facility] EMERGENCY DEPARTMENT Provider Note   CSN: [id number] Arrival date & time: 06/04/21  0093     History  Chief Complaint  Patient presents with   Constipation    Jeremy [name] is a 86 y.o. male.   Constipation  Patient has a history of depression without active thoughts of harming himself at this time.  No history of prior abdominal surgeries.  Patient states he has been having trouble with constipation for several weeks now.  He has had similar problems in the past but not this severe.  He has tried several medications as recommended by his doctor including enemas and stool softeners..  None of them have been effective.  He is passing stool with those treatments but he has a sensation that he is impacted.  He is having discomfort in his upper abdomen and feels the stool is up there.  He has not had any nausea or vomiting but his appetite has been poor.Marland Kitchen  He denies any fevers.  Patient feels like the symptoms are getting worse so he came to the ED today.  Home Medications Prior to Admission medications   Medication Sig Start Date End Date Taking? Authorizing Provider  polyethylene glycol (MIRALAX) 17 g packet Take 17 g by mouth 2 (two) times daily. 06/04/21 07/04/21 Yes Dorie Rank, MD  B Complex Vitamins (VITAMIN B COMPLEX) TABS Take 1 tablet by mouth daily.    [provider]  calcium-vitamin D (OSCAL WITH D) 500-200 MG-UNIT per tablet Take 1 tablet by mouth daily. Patient not taking: Reported on 04/11/2021    [provider]  cholecalciferol (VITAMIN D3) 25 MCG (1000 UNIT) tablet Take 1,000 Units by mouth daily.    [provider]  DULoxetine (CYMBALTA) 60 MG capsule Take 2 capsules (120 mg total) by mouth every morning. 03/10/21   Donnal Moat T, PA-C  finasteride (PROSCAR) 5 MG tablet Take 5 mg by mouth every morning. 01/10/19   [provider]  LORazepam (ATIVAN) 0.5 MG tablet Take 1/2-1 tablet twice daily as needed for severe  anxiety/panic 04/26/21   Donnal Moat T, PA-C  magnesium 30 MG tablet Take 30 mg by mouth daily.    [provider]  mirtazapine (REMERON) 30 MG tablet Take 1 tablet (30 mg total) by mouth at bedtime. 03/10/21   Donnal Moat T, PA-C  Multiple Vitamins-Minerals (MULTIVITAMIN WITH MINERALS) tablet Take 1 tablet by mouth daily.    [provider]  Omega-3 Fatty Acids (FISH OIL BURP-LESS PO) Take by mouth.    [provider]  risperiDONE (RISPERDAL) 2 MG tablet Take 1 tablet (2 mg total) by mouth at bedtime. Take 1 po qhs.  If wakes up in middle of night, may take 1/2 pill (1mg ) 04/26/21   Addison Lank, PA-C  saw palmetto 160 MG capsule Take 160 mg by mouth daily.    [provider]  tamsulosin (FLOMAX) 0.4 MG CAPS capsule Take 0.4 mg by mouth every evening. 01/12/19   [provider]  Turmeric (QC TUMERIC COMPLEX PO) Take by mouth.    [provider]  Vitamins C E (VITAMIN C & E COMBINATION PO) Take 1 tablet by mouth daily. Patient not taking: Reported on 04/11/2021    [provider]  ZINC SULFATE PO Take 1 tablet by mouth daily.    [provider]      Allergies    Aspergillus allergy skin test, Aspirin, Sulfa antibiotics, and Theophyllines    Review of  Systems   Review of Systems  Gastrointestinal:  Positive for constipation.   Physical Exam Updated Vital Signs BP 122/78    Pulse 92    Temp (!) 97.4 F (36.3 C) (Oral)    Resp 15    SpO2 100%  Physical Exam Vitals and nursing note reviewed.  Constitutional:      Appearance: He is well-developed.     Comments: Elderly, frail  HENT:     Head: Normocephalic and atraumatic.     Right Ear: External ear normal.     Left Ear: External ear normal.  Eyes:     General: No scleral icterus.       Right eye: No discharge.        Left eye: No discharge.     Conjunctiva/sclera: Conjunctivae normal.  Neck:     Trachea: No tracheal deviation.  Cardiovascular:     Rate  and Rhythm: Normal rate and regular rhythm.  Pulmonary:     Effort: Pulmonary effort is normal. No respiratory distress.     Breath sounds: Normal breath sounds. No stridor. No wheezing or rales.  Abdominal:     General: Abdomen is flat. Bowel sounds are normal. There is no distension.     Palpations: Abdomen is soft.     Tenderness: There is no abdominal tenderness. There is no guarding or rebound.  Genitourinary:    Comments: Rectal exam, no mass appreciated, no fecal impaction appreciated Musculoskeletal:        General: No tenderness or deformity.     Cervical back: Neck supple.  Skin:    General: Skin is warm and dry.     Findings: No rash.  Neurological:     General: No focal deficit present.     Mental Status: He is alert.     Cranial Nerves: No cranial nerve deficit (no facial droop, extraocular movements intact, no slurred speech).     Sensory: No sensory deficit.     Motor: No abnormal muscle tone or seizure activity.     Coordination: Coordination normal.  Psychiatric:     Comments: Depressed mood    ED Results / Procedures / Treatments   Labs (all labs ordered are listed, but only abnormal results are displayed) Labs Reviewed  COMPREHENSIVE METABOLIC PANEL - Abnormal; Notable for the following components:      Result Value   Chloride 97 (*)    Glucose, Bld 123 (*)    Total Protein 6.4 (*)    All other components within normal limits  URINALYSIS, ROUTINE W REFLEX MICROSCOPIC - Abnormal; Notable for the following components:   Ketones, ur 20 (*)    All other components within normal limits  LIPASE, BLOOD  CBC    EKG None  Radiology CT Abdomen Pelvis W Contrast  Result Date: 06/04/2021 CLINICAL DATA:  86 year old male with history of constipation. Suspected bowel obstruction. EXAM: CT ABDOMEN AND PELVIS WITH CONTRAST TECHNIQUE: Multidetector CT imaging of the abdomen and pelvis was performed using the standard protocol following bolus administration of  intravenous contrast. CONTRAST:  188mL OMNIPAQUE IOHEXOL 300 MG/ML  SOLN COMPARISON:  No priors. FINDINGS: Lower chest: Scattered areas of cylindrical bronchiectasis and mild scarring in the lung bases bilaterally. Atherosclerotic calcifications in the descending thoracic aorta. Hepatobiliary: No suspicious cystic or solid hepatic lesions. No intra or extrahepatic biliary ductal dilatation. Gallbladder is normal in appearance. Pancreas: No pancreatic mass. No pancreatic ductal dilatation. No pancreatic or peripancreatic fluid collections or inflammatory changes. Spleen: Unremarkable. Adrenals/Urinary  Tract: Subcentimeter low-attenuation lesion in the lower pole the left kidney, too small to characterize, but statistically likely to represent a tiny cyst. Right kidney and bilateral adrenal glands are normal in appearance. No hydroureteronephrosis. Urinary bladder is normal in appearance. Stomach/Bowel: The appearance of the stomach is normal. No pathologic dilatation of small bowel or colon. Large volume of stool throughout the colon. The appendix is not confidently identified and may be surgically absent. Regardless, there are no inflammatory changes noted adjacent to the cecum to suggest the presence of an acute appendicitis at this time. Vascular/Lymphatic: Aortic atherosclerosis, without evidence of aneurysm or dissection in the abdominal or pelvic vasculature. No lymphadenopathy noted in the abdomen or pelvis. Reproductive: Prostate gland and seminal vesicles are unremarkable in appearance. Other: Trace volume of ascites.  No pneumoperitoneum. Musculoskeletal: There are no aggressive appearing lytic or blastic lesions noted in the visualized portions of the skeleton. IMPRESSION: 1. No evidence of bowel obstruction. 2. Large volume of stool in the colon, suggesting constipation. 3. Trace volume of ascites. 4. Mild cylindrical bronchiectasis noted throughout the visualize lung bases. 5. Aortic atherosclerosis. 6.  Additional incidental findings, as above. Electronically Signed   By: Vinnie Langton M.D.   On: 06/04/2021 14:17    Procedures Procedures    Medications Ordered in ED Medications  iohexol (OMNIPAQUE) 300 MG/ML solution 100 mL (100 mLs Intravenous Contrast Given 06/04/21 1407)  sorbitol, milk of mag, mineral oil, glycerin (SMOG) enema (960 mLs Rectal Given 06/04/21 1636)    ED Course/ Medical Decision Making/ A&P Clinical Course as of 06/04/21 1750  Sun Jun 04, 2021  1213 Labs reviewed.  CBC is normal.  Lipase is normal.  Metabolic panel is normal.  No signs of hepatitis.  No signs of acute kidney injury [JK]  1426 CT scan shows evidence of constipation.  No signs of diverticulitis or colitis no impaction noted [JK]  1747 Patient had good results after the enema.  He is feeling much better [JK]    Clinical Course User Index [JK] Dorie Rank, MD                           Medical Decision Making  Patient presented to the ED for evaluation of abdominal pain.  Concerned about the possibility of constipation versus bowel obstruction versus colitis versus diverticulitis.  ED work-up reassuring.  CT scan was performed and it did not show any evidence of obstruction.  It did confirm constipation without acute complication.  he was given an enema in the ED.  He ended up having a large bowel movement and is now feeling much better.  Will discharge home on bowel regimen.  Recommend outpatient follow-up with gastroenterology.        Final Clinical Impression(s) / ED Diagnoses Final diagnoses:  Constipation, unspecified constipation type    Rx / DC Orders ED Discharge Orders          Ordered    polyethylene glycol (MIRALAX) 17 g packet  2 times daily        06/04/21 1748              Dorie Rank, MD 06/04/21 1750

## 2021-06-04 NOTE — Discharge Instructions (Signed)
Make sure to stay well-hydrated.  Take MiraLAX to help prevent recurrent constipation.  Follow-up with a GI doctor for further evaluation.

## 2021-06-04 NOTE — ED Notes (Signed)
Pt ambulated to bathroom, pt attempting to give [place] a urine sample. Per pt, he doesn't urinate a lot, maybe once a day, denies dysuria

## 2021-06-04 NOTE — ED Provider Notes (Signed)
Emergency Medicine Provider Triage Evaluation Note  Jeremy [name] , a 86 y.o. male  was evaluated in triage.  Pt complains of constipation.  Patient has not been able to move his bowels for 4 weeks.  He is concerned he may be impacted or have some sort of obstruction.  He reports his abdomen feels distended.  He has had decreased appetite but is not vomiting.  He has tried over-the-counter stool softeners, and enema and lactulose as directed by his PCP without any improvement.  Has known history of constipation but has never been this severe  Review of Systems  Positive: Constipation, abdominal distention Negative: Vomiting, fevers, abdominal pain, rectal bleeding  Physical Exam  BP (!) 155/92 (BP Location: Right Arm)    Pulse 82    Temp (!) 97.4 F (36.3 C) (Oral)    Resp 16    SpO2 100%  Gen:   Awake, no distress   Resp:  Normal effort  MSK:   Moves extremities without difficulty  Other:  Mild generalized abdominal tenderness  Medical Decision Making  Medically screening exam initiated at 9:18 AM.  Appropriate orders placed.  Ramy Greth was informed that the remainder of the evaluation will be completed by another provider, this initial triage assessment does not replace that evaluation, and the importance of remaining in the ED until their evaluation is complete.     Jacqlyn Larsen, PA-C 06/04/21 0935    Lajean Saver, MD 06/05/21 [phone]

## 2021-06-04 NOTE — ED Notes (Signed)
Unable to get pts temp. Pt drinking cold water.

## 2021-06-08 ENCOUNTER — Encounter: Payer: Self-pay | Admitting: Physician Assistant

## 2021-06-08 ENCOUNTER — Other Ambulatory Visit: Payer: Self-pay

## 2021-06-08 ENCOUNTER — Ambulatory Visit (INDEPENDENT_AMBULATORY_CARE_PROVIDER_SITE_OTHER): Payer: Medicare Other | Admitting: Physician Assistant

## 2021-06-08 DIAGNOSIS — R5383 Other fatigue: Secondary | ICD-10-CM | POA: Diagnosis not present

## 2021-06-08 DIAGNOSIS — F411 Generalized anxiety disorder: Secondary | ICD-10-CM | POA: Diagnosis not present

## 2021-06-08 DIAGNOSIS — F322 Major depressive disorder, single episode, severe without psychotic features: Secondary | ICD-10-CM

## 2021-06-08 DIAGNOSIS — Z8659 Personal history of other mental and behavioral disorders: Secondary | ICD-10-CM

## 2021-06-08 DIAGNOSIS — R5381 Other malaise: Secondary | ICD-10-CM | POA: Diagnosis not present

## 2021-06-08 DIAGNOSIS — K5900 Constipation, unspecified: Secondary | ICD-10-CM

## 2021-06-08 MED ORDER — RISPERIDONE 3 MG PO TABS: 3.0000 mg | ORAL_TABLET | Freq: Every day | ORAL | 1 refills | Status: DC

## 2021-06-08 NOTE — Progress Notes (Signed)
[facility]  Patient ID: Jeremy [name],  MRN: [id number]  PCP: Janie Morning, DO  Date of Evaluation: 06/08/2021 Time spent:40 minutes  Chief Complaint:  Chief Complaint   Depression; Anxiety; Follow-up     HISTORY/CURRENT STATUS: HPI not doing well.   Spent 10 hours in ER on New Years Day, for severe constipation. Had neg labs and CT neg for acute findings. Only showed constipation. Had enema and note shows he had a BM and felt better but he states it didn't help.  States it was a horrible experience and he never wants to have to go back again.  Still very depressed. Staying in bed a lot.  He has no energy or motivation.  Wants to isolate but also be left alone.  Feels hopeless like there is no treatment that will help and states he is not really sure if he wants help.  Having a very difficult time making any decisions, feels like he is in a cloud and cannot think.  A fog that he cannot get out of.  Appetite is low but he is not sure if it from the depression or the constipation or both. Feels like his life is finished.  Personal hygiene is normal.  He has no plans to hurt himself but he does not really care if he lives or dies.  Denies homicidal thoughts.  He has been on the higher dose of Risperdal since 04/26/2021.  Lithium was started that day and at the visit on 05/09/2021 that was discontinued because he was very uncomfortable taking it.  At that visit he did not want to make any changes in medications.  Increased depression has been present for approximately 3 months now.  Patient denies increased energy with decreased need for sleep, no increased talkativeness, no racing thoughts, no impulsivity or risky behaviors, no increased spending, no increased libido, no grandiosity, no increased irritability or anger, no paranoia, and no hallucinations.  The anxiety is better since the increase of Risperdal on 04/26/2021.  He does have the Ativan if needed but has not been taking it  much at all.    Review of Systems  Constitutional:  Positive for malaise/fatigue.  HENT: Negative.    Eyes: Negative.   Respiratory: Negative.    Cardiovascular: Negative.   Gastrointestinal:  Positive for constipation.  Genitourinary: Negative.   Musculoskeletal: Negative.   Skin: Negative.   Neurological: Negative.   Endo/Heme/Allergies: Negative.   Psychiatric/Behavioral:         See HPI.     Individual Medical History/ Review of Systems: Changes? :No    Past medications for mental health diagnoses include: (None listed in old chart)  Was hospitalized many times since 1964, most recent was in 2005.   Allergies: Aspergillus allergy skin test, Aspirin, Sulfa antibiotics, and Theophyllines  Current Medications:  Current Outpatient Medications:    B Complex Vitamins (VITAMIN B COMPLEX) TABS, Take 1 tablet by mouth daily., Disp: , Rfl:    cholecalciferol (VITAMIN D3) 25 MCG (1000 UNIT) tablet, Take 1,000 Units by mouth daily., Disp: , Rfl:    DULoxetine (CYMBALTA) 60 MG capsule, Take 2 capsules (120 mg total) by mouth every morning., Disp: 180 capsule, Rfl: 1   finasteride (PROSCAR) 5 MG tablet, Take 5 mg by mouth every morning., Disp: , Rfl:    LORazepam (ATIVAN) 0.5 MG tablet, Take 1/2-1 tablet twice daily as needed for severe anxiety/panic, Disp: 60 tablet, Rfl: 1   magnesium 30 MG tablet, Take 30 mg by  mouth daily., Disp: , Rfl:    mirtazapine (REMERON) 30 MG tablet, Take 1 tablet (30 mg total) by mouth at bedtime., Disp: 90 tablet, Rfl: 1   Multiple Vitamins-Minerals (MULTIVITAMIN WITH MINERALS) tablet, Take 1 tablet by mouth daily., Disp: , Rfl:    Omega-3 Fatty Acids (FISH OIL BURP-LESS PO), Take by mouth., Disp: , Rfl:    polyethylene glycol (MIRALAX) 17 g packet, Take 17 g by mouth 2 (two) times daily., Disp: 60 packet, Rfl: 0   risperiDONE (RISPERDAL) 3 MG tablet, Take 1 tablet (3 mg total) by mouth at bedtime., Disp: 30 tablet, Rfl: 1   saw palmetto 160 MG capsule,  Take 160 mg by mouth daily., Disp: , Rfl:    tamsulosin (FLOMAX) 0.4 MG CAPS capsule, Take 0.4 mg by mouth every evening., Disp: , Rfl:    Turmeric (QC TUMERIC COMPLEX PO), Take by mouth., Disp: , Rfl:    ZINC SULFATE PO, Take 1 tablet by mouth daily., Disp: , Rfl:    calcium-vitamin D (OSCAL WITH D) 500-200 MG-UNIT per tablet, Take 1 tablet by mouth daily. (Patient not taking: Reported on 04/11/2021), Disp: , Rfl:    Vitamins C E (VITAMIN C & E COMBINATION PO), Take 1 tablet by mouth daily. (Patient not taking: Reported on 04/11/2021), Disp: , Rfl:  Medication Side Effects: none  Family Medical/ Social History: Changes? No  MENTAL HEALTH EXAM:  There were no vitals taken for this visit.There is no height or weight on file to calculate BMI.  General Appearance: Casual, Neat and Well Groomed  Eye Contact:  Good  Speech:  Clear and Coherent and Normal Rate  Volume:  Decreased  Mood:  Depressed  Affect:  Depressed  Thought Process:  Goal Directed and Descriptions of Associations: Circumstantial  Orientation:  Full (Time, Place, and Person)  Thought Content: Logical, Obsessions, and Rumination   Suicidal Thoughts:  Yes.  without intent/plan  Homicidal Thoughts:  No  Memory:  WNL  Judgement:  Good  Insight:  Good  Psychomotor Activity:  Normal  Concentration:  Concentration: Good and Attention Span: Good  Recall:  Good  Fund of Knowledge: Good  Language: Good  Assets:  Desire for Improvement  ADL's:  Intact  Cognition: WNL  Prognosis:  Good   06/04/2021 ER encounter notes were reviewed. CBC without differential was normal. CMP sodium was normal at 136, glucose 123, LFTs were normal. Lipase was normal.   UA was normal except for ketones. CT and abdomen and pelvis showed no acute findings.  Constipation was present.    DIAGNOSES:    ICD-10-CM   1. Severe depression ([place])  F32.2     2. Generalized anxiety disorder  F41.1     3. Constipation, unspecified constipation type  K59.00      4. Malaise and fatigue  R53.81    R53.83     5. History of psychosis  Z86.59       Receiving Psychotherapy: Yes Dr. Jonni Sanger Mitchum   RECOMMENDATIONS:  PDMP was reviewed.  Ativan last filled 04/26/2021. I provided 40 minutes of face to face time during this encounter, including time spent before and after the visit in records review, medical decision making, counseling pertinent to today's visit, and charting.  At the last visit, I had ordered a BMP because of the low sodium level but that is no longer necessary as his sodium was normal in the ER 4 days ago. We had a long discussion about different options.  We could  add Abilify, Wellbutrin, increase the Risperdal, or change antidepressants altogether.  He is very hesitant to make any changes.  Tyquarius was told he has to do something or he is going to continue to feel this way and we know there is help available.  I understand he would prefer not adding another medication and I agree.  Changing antidepressants altogether is not really a good option because it will take longer for it to take effect than the other options.  Plus there are no records of previous trials which makes it more difficult to decide on an appropriate medication.  Therefore I recommend increasing the Risperdal. I asked Aikam to not read about any of his medications, that has been a problem, even when I have recommended MiraLAX when he was seeing Dr. Rica Mote a week or so ago.  Breylin is very concerned about side effects, I asked him to trust the medical professionals on that. Contract for safety is in place.  He knows to go to [facility] if the depression worsens and he has active suicidal thoughts.  I explained that this is not regular emergency room, it is for mental health emergencies only and the experience he had in the emergency room on New Year's Day is extremely unlikely to happen. I gave him information on [place] [facility] and  asked him or his wife to call today.  He will be a new patient, he has seen [facility] gastroenterology in the past but only once and that has been at least over 3 years ago.  In the meantime he should take MiraLAX every day for at least 3 days.  It is safe to take it that way and we will help him have a bowel movement and get more regular until he can get in to see a gastroenterologist.  It is encouraging that his CT scan of the abdomen and pelvis did not show any acute findings such as bowel obstruction. Increase Risperdal to 3 mg, 1 p.o. nightly. Continue Ativan 0.5 mg, 1/2-1 p.o. twice daily as needed. Continue Cymbalta 60 mg, 2 p.o. every morning. Continue mirtazapine 30 mg, 1 p.o. nightly. Continue B complex, vitamin D, magnesium, multivitamin, fish oil, saw palmetto, turmeric Continue therapy with Dr. [name].  Return in 4 weeks.  Donnal Moat, PA-C

## 2021-06-09 ENCOUNTER — Ambulatory Visit (INDEPENDENT_AMBULATORY_CARE_PROVIDER_SITE_OTHER): Payer: Medicare Other | Admitting: Psychiatry

## 2021-06-09 DIAGNOSIS — F5104 Psychophysiologic insomnia: Secondary | ICD-10-CM | POA: Diagnosis not present

## 2021-06-09 DIAGNOSIS — Z8659 Personal history of other mental and behavioral disorders: Secondary | ICD-10-CM | POA: Insufficient documentation

## 2021-06-09 DIAGNOSIS — Z9189 Other specified personal risk factors, not elsewhere classified: Secondary | ICD-10-CM

## 2021-06-09 DIAGNOSIS — F411 Generalized anxiety disorder: Secondary | ICD-10-CM | POA: Insufficient documentation

## 2021-06-09 DIAGNOSIS — F322 Major depressive disorder, single episode, severe without psychotic features: Secondary | ICD-10-CM | POA: Diagnosis not present

## 2021-06-09 DIAGNOSIS — K5900 Constipation, unspecified: Secondary | ICD-10-CM | POA: Diagnosis not present

## 2021-06-09 NOTE — Progress Notes (Signed)
Psychotherapy Progress Note [facility] [name], PhD LP  Patient ID: Maze Corniel [facility])    MRN: [id number] Therapy format: Family therapy w/ patient -- accompanied by Arman Bogus Date: 06/09/2021      Start: 2:10p     Stop: 3:00p     Time Spent: 50 min Location: In-person   Session narrative (presenting needs, interim history, self-report of stressors and symptoms, applications of prior therapy, status changes, and interventions made in session) Meeting alone to begin with, [name] says he's doing OK and he says [name] is "upbeat".  But "this is gonna be real tough today."  Doesn't have any faith in Risperdal to help but he did agree to take the extra 1mg  last night.  Slept 5 hrs b/w midnight and 6a, and improvement.  In persistent pain while lying in bed, credits to lying around a lot, stuck on the idea that he is suffering, bodily and emotionally, because he is "addicted to the bed."  Right now feels in pain all over, just sitting up, a burning sensation including his ears.  Stomach is bloated, feels like he is probably eating when he shouldn't.  Did not see PCP as recommended after last week's visit.  Went to ER earlier this week to address acute constipation, says it was hellish experience, felt like just a number, passed from person to person.  Reportedly had a marginally helpful enema.  Notes say he does pass some stool, but he says that is not true.  Was scanned, with no blockage or appendicitis noted, but clearly constipation.  Rx'd Miralax BID, but he has felt he couldn't swallow it reliably due to bloating and felt it didn't "work" anyway.  Hard to discern how often has taken Miralax, saying it "didn't help":either, but he clearly laid off, and no spontaneous BMs all week.  Allegedly [name] was in touch with the PCP nurse, getting some alternative recommendation, but not clear that all was communicated.    Brought Marian in.  He has gotten some broth, and she sincerely  thought he was swallowing Miralax, but he admits he hasn't been.  Best we can determine, he did take it Monday and quit.  Had some soup after ER.  Allegedly tried the PCP recommendation one day.  Admits he has eaten some egg and broth, then that he has been "compulsively" eating solids, like trail mix and granola, suggestion being he is trying to be good and get protein.    Firmly confronted mismanagement of bowel function and the acute need to stop solid food and consume fluids only plus Miralax until constipation is resolved.  Cannot guarantee he won't need a return trip to the ER to irrigate, but he has to fully follow medical advice for 2/day Miralax and fluids, and [name] has to be reliably aware of what he gets down and ready to take him   To the better, [name] notes, he has been getting up, getting dressed and ADLs, but she realizes now she's been mistaken about intake.  His intake.  Query to [place], he urinates just 1/day, dark.  Still says we aren't dealing with "the elephant in the room" -- lying around in bed -- but firmly redirected that is secondary to relieving his constipation.  Instructed again about fluids and Miralax, and offered that any walking, any movement that twists torso, will help mechanically to relieve constipation.  All issues of guilt or anxiety really have to be secondary to reopening his alimentary system.  Therapeutic modalities: Cognitive Behavioral Therapy, Solution-Oriented/Positive Psychology, and Psycho-education/Bibliotherapy  Mental Status/Observations:  Appearance:   Neat and thin      Behavior:  preoccupied  Motor:  Shaken  Speech/Language:   Softer, more tentative  Affect:  Constricted  Mood:  anxious, depressed, and guilty  Thought process:  normal  Thought content:    Obsessions and Rumination  Sensory/Perceptual disturbances:    Skin and joint pain  Orientation:  grossly intact  Attention:  Good    Concentration:  Fair  Memory:  grossly intact   Insight:    Fair  Judgment:   Poor  Impulse Control:  Fair   Risk Assessment: Danger to Self: No Self-injurious Behavior:  via mismanagement  of health Danger to Others: No Physical Aggression / Violence: No Duty to Warn: No Access to Firearms a concern: No  Assessment of progress:   deteriorating, approaching need of hospitalization  Diagnosis:   ICD-10-CM   1. Severe depression ([place])  F32.2     2. Generalized anxiety disorder  F41.1     3. Constipation, unspecified constipation type Acute K59.00     4. Psychophysiological insomnia  F51.04     5. History of psychosis  Z86.59     6. Potential for self care deficit  Z91.89      Plan:  Firm priority on freeing the bowel above all else -- only fluids and Miralax 2/day until resolved, no solids [name] will assume responsibility to monitor fluid intake, provide fluids, supervise taking Miralax, and know about output Other recommendations/advice as may be noted above Continue to utilize previously learned skills ad lib Maintain medication as prescribed and work faithfully with relevant prescriber(s) if any changes are desired or seem indicated Call the clinic on-call service, 988/hotline, 911, or present to [facility] or ER if any life-threatening psychiatric crisis Return in 4 days (on 06/13/2021), or OK for Tues 1pm if willing, for put on cancellation list. Already scheduled visit in this office 06/22/2021.  Blanchie Serve, PhD [name], PhD LP Clinical Psychologist, [facility] [address] [place], [place] [address] [phone]

## 2021-06-12 ENCOUNTER — Telehealth: Payer: Self-pay | Admitting: Physician Assistant

## 2021-06-12 NOTE — Telephone Encounter (Signed)
Pt's wife [name] called asking Jonni Sanger to return her call. Need to give info about Pt. CONTACT # [phone]

## 2021-06-12 NOTE — Telephone Encounter (Signed)
Admin note for non-service contact  Patient ID: Jeremy [name]  MRN: [id number] DATE: 06/12/2021  RTC to wife Jeremy [name], unable to attend 1pm tomorrow as offered but has succeeded in relieving acute constipation with 2/day Miralax and fluids.  Jeremy [name] wants to know if he can graduate from clear liquids at this point.  Defer to professional medical advice, but sounds like he should be able to back off to 1/day Miralax, maintain fluids, seek electrolytes like Pedialyte or Gatorade if too runny, fold in soft foods, and thin Miralax schedule once successive easy BMs manifest.  If still feels bloated, difficulty swallowing, may still need more liquid and movement to resolve caked stool.  On [place] list.  Blanchie Serve, PhD [name], PhD LP Clinical Psychologist, [facility] [address] [place], [place] [address] [phone]

## 2021-06-12 NOTE — Telephone Encounter (Signed)
Noted. I spoke with Dominica Severin and [employer] as well. He has an appointment 'coming up soon' to see GI. [name] told me he has several bottles of medications for constipation that have been recommended by his PCP over the past month. Anna reported that the Miralax worked, but now he's constipated again. Last BM was 06/10/2021, not hard feces and not difficult to express.  Then he implied he had loose bowel movements.  The recommendations made by Dr. Rica Mote were on 06/09/2021 so the timing that Maleki is reporting has been less than 3 days and his history is very confusing.  He is fixated on a clear liquid diet as was recommended, he has been drinking 32 ounces of water twice daily, a little juice, and his wife made a smoothie for him this morning, which he is concerned about since it is not a clear liquid.  I recommend he progress diet, he needs more nutrition at this point, try soft foods such as rice, mashed potatoes, recommend Gatorade 1 glass a day, eat 5 prunes a day or drink prune juice daily and if he does have hard stool then it is okay to continue the MiraLAX.  He really needs to talk with his PCP and the gastroenterologist that he will be seeing very soon.  I did stress the importance of being up, not laying in bed all the time.  That slows down peristalsis.  He said okay and he would try.

## 2021-06-22 ENCOUNTER — Ambulatory Visit (INDEPENDENT_AMBULATORY_CARE_PROVIDER_SITE_OTHER): Payer: Medicare Other | Admitting: Physician Assistant

## 2021-06-22 ENCOUNTER — Encounter: Payer: Self-pay | Admitting: Physician Assistant

## 2021-06-22 ENCOUNTER — Other Ambulatory Visit: Payer: Self-pay

## 2021-06-22 VITALS — Wt 104.0 lb

## 2021-06-22 DIAGNOSIS — R634 Abnormal weight loss: Secondary | ICD-10-CM

## 2021-06-22 DIAGNOSIS — K5900 Constipation, unspecified: Secondary | ICD-10-CM | POA: Diagnosis not present

## 2021-06-22 DIAGNOSIS — F322 Major depressive disorder, single episode, severe without psychotic features: Secondary | ICD-10-CM | POA: Diagnosis not present

## 2021-06-22 DIAGNOSIS — R5383 Other fatigue: Secondary | ICD-10-CM

## 2021-06-22 DIAGNOSIS — R5381 Other malaise: Secondary | ICD-10-CM | POA: Diagnosis not present

## 2021-06-22 DIAGNOSIS — Z8659 Personal history of other mental and behavioral disorders: Secondary | ICD-10-CM | POA: Diagnosis not present

## 2021-06-22 DIAGNOSIS — F411 Generalized anxiety disorder: Secondary | ICD-10-CM | POA: Diagnosis not present

## 2021-06-22 DIAGNOSIS — R64 Cachexia: Secondary | ICD-10-CM

## 2021-06-22 NOTE — Progress Notes (Signed)
[facility]  Patient ID: Jeremy [name],  MRN: [id number]  PCP: Jeremy Morning, DO  Date of Evaluation: 06/22/2021 Time spent:50 minutes  Chief Complaint:  Chief Complaint   Depression; Insomnia; Follow-up     HISTORY/CURRENT STATUS: HPI For 2 wk f/u.  Not doing well.  His wife, Jeremy [name], comes in for the latter half of the visit.  Feels a little more upbeat today. "It's unusual." Says he's addicted to staying in bed. But then says he thinks the increased Risperdal to 3 mg two weeks ago is helping his mood.  Still cannot enjoy things.  Energy and motivation are extremely low.  He states he is weaker than he was at the last visit.  Feels like his legs are so weak he was amazed he could walk down the hall to my office.  Says his whole body is sore but that is to be expected because he stays in bed all the time.  Feels like nothing else can be done and there is no hope.  Thinks "it is a done deal."  Not having active suicidal thoughts but does feel like it would be nice to go to sleep and not wake up. The anxiety is quite a bit better and he is not needing the Ativan at all.  No homicidal thoughts.  States his bowels might be starting to work better, not taking the Miralax daily. He answers questions very vaguely, says one thing, like he has loose stool some days but then says he has been constipated for months.  Then goes back to say that he has been constipated for only 1 month now.  He feels very full all the time.  He is still hesitant to take the MiraLAX as directed.  "On the bottle it says to take it no more than 7 days."  He has an appointment with [facility] tomorrow.  From what I understand he will be speaking with a nurse, not seeing a provider but that is unclear.  He has not had any solid foods in 2 weeks now, he drinks a lot of water, has had chicken broth, a few protein smoothies, has had maybe 1/2 bowl of lentil or cream of broccoli soup 2 or 3 times in the next 2 weeks.  Has  been refusing the Pedialyte or Gatorade which I recommended in the past.  He is afraid that drinking it will hurt his teeth because there is so much sugar in it.  He states that Dr. Rica [name] told him in counseling 2 weeks ago to only have fluids and no solid foods until constipation resolved. Jeremy [name] is fixated on that, but not taking the advice given him on that same day concerning MiraLAX use, again stating the bottle says to use for 7 days only.  Was seen in the ER 06/04/2021 for the constipation, CT ruled out bowel obstruction, but did have a large volume of stool in the colon.  He had a horrible experience in the ER, was there 12 hours and states he would never go back again.  Feels like nothing was done for him.  Notes say he was given an enema with good results however.  Jeremy [name] reports weight loss, unsure of the exact amount but states he is weight was 127 prior to this severe depressive episode.  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  He monitor his weight closely during that time  and then the extreme depression hit in October.  Review of Systems  Constitutional:  Positive for malaise/fatigue and weight loss.  HENT: Negative.    Eyes: Negative.   Respiratory: Negative.    Cardiovascular: Negative.   Gastrointestinal:        See HPI  Genitourinary: Negative.   Musculoskeletal:  Positive for back pain and myalgias.       See HPI  Skin: Negative.   Neurological: Negative.   Endo/Heme/Allergies: Negative.   Psychiatric/Behavioral:         See HPI   Individual Medical History/ Review of Systems: Changes? :Yes    see HPI  Past medications for mental health diagnoses include: (None listed in old chart)  Was hospitalized many times since 1964, most recent was in 2005.   Allergies: Aspergillus allergy skin test, Aspirin, Sulfa antibiotics, and Theophyllines  Current Medications:  Current Outpatient Medications:     DULoxetine (CYMBALTA) 60 MG capsule, Take 2 capsules (120 mg total) by mouth every [name]., Disp: 180 capsule, Rfl: 1   finasteride (PROSCAR) 5 MG tablet, Take 5 mg by mouth every [name]., Disp: , Rfl:    LORazepam (ATIVAN) 0.5 MG tablet, Take 1/2-1 tablet twice daily as needed for severe anxiety/panic, Disp: 60 tablet, Rfl: 1   mirtazapine (REMERON) 30 MG tablet, Take 1 tablet (30 mg total) by mouth at bedtime., Disp: 90 tablet, Rfl: 1   polyethylene glycol (MIRALAX) 17 g packet, Take 17 g by mouth 2 (two) times daily. (Patient taking differently: Take 17 g by mouth daily as needed.), Disp: 60 packet, Rfl: 0   risperiDONE (RISPERDAL) 3 MG tablet, Take 1 tablet (3 mg total) by mouth at bedtime., Disp: 30 tablet, Rfl: 1   B Complex Vitamins (VITAMIN B COMPLEX) TABS, Take 1 tablet by mouth daily. (Patient not taking: Reported on 06/22/2021), Disp: , Rfl:    calcium-vitamin D (OSCAL WITH D) 500-200 MG-UNIT per tablet, Take 1 tablet by mouth daily. (Patient not taking: Reported on 04/11/2021), Disp: , Rfl:    cholecalciferol (VITAMIN D3) 25 MCG (1000 UNIT) tablet, Take 1,000 Units by mouth daily. (Patient not taking: Reported on 06/22/2021), Disp: , Rfl:    magnesium 30 MG tablet, Take 30 mg by mouth daily. (Patient not taking: Reported on 06/22/2021), Disp: , Rfl:    Multiple Vitamins-Minerals (MULTIVITAMIN WITH MINERALS) tablet, Take 1 tablet by mouth daily. (Patient not taking: Reported on 06/22/2021), Disp: , Rfl:    Omega-3 Fatty Acids (FISH OIL BURP-LESS PO), Take by mouth. (Patient not taking: Reported on 06/22/2021), Disp: , Rfl:    saw palmetto 160 MG capsule, Take 160 mg by mouth daily. (Patient not taking: Reported on 06/22/2021), Disp: , Rfl:    tamsulosin (FLOMAX) 0.4 MG CAPS capsule, Take 0.4 mg by mouth every evening. (Patient not taking: Reported on 06/22/2021), Disp: , Rfl:    Turmeric (QC TUMERIC COMPLEX PO), Take by mouth. (Patient not taking: Reported on 06/22/2021), Disp: , Rfl:     Vitamins C E (VITAMIN C & E COMBINATION PO), Take 1 tablet by mouth daily. (Patient not taking: Reported on 04/11/2021), Disp: , Rfl:    ZINC SULFATE PO, Take 1 tablet by mouth daily. (Patient not taking: Reported on 06/22/2021), Disp: , Rfl:  Medication Side Effects: none  Family Medical/ Social History: Changes? No  MENTAL HEALTH EXAM:  Weight 104 lb (47.2 kg).There is no height or weight on file to calculate BMI.  General Appearance: Casual, Well Groomed, and obvious weight loss, temporal  wasting, cachectic  Eye Contact:  Good  Speech:  Clear and Coherent and Slow  Volume:  Decreased  Mood:  Depressed and Hopeless  Affect:  Depressed and Flat  Thought Process:  Goal Directed and Descriptions of Associations: Circumstantial  Orientation:  Full (Time, Place, and Person)  Thought Content: Illogical, Obsessions, and Rumination   Suicidal Thoughts:   See HPI  Homicidal Thoughts:  No  Memory:  WNL  Judgement:  Impaired  Insight:  Good  Psychomotor Activity:  Decreased and walks very slowly down the hall.  Needs no assistance.  Concentration:  Concentration: Good and Attention Span: Good  Recall:  Good  Fund of Knowledge: Good  Language: Good  Assets:  Desire for Improvement  ADL's:  Intact  Cognition: WNL  Prognosis:  Fair   06/04/2021 ER encounter notes were reviewed. CBC without differential was normal. CMP sodium was normal at 136, glucose 123, LFTs were normal. Lipase was normal.   UA was normal except for ketones. CT and abdomen and pelvis showed no acute findings.  Constipation was present.    DIAGNOSES:    ICD-10-CM   1. Severe depression ([place])  F32.2     2. Loss of weight  R63.4     3. Generalized anxiety disorder  F41.1     4. Malaise and fatigue  R53.81    R53.83     5. History of psychosis  Z86.59     6. Constipation, unspecified constipation type  K59.00     7. Cachexia ([place])  R64        Receiving Psychotherapy: Yes Jeremy [name]    RECOMMENDATIONS:  PDMP was reviewed. Ativan filled 04/26/2022. I provided 40+ minutes of face to face time during this encounter, including time spent before and after the visit in records review, medical decision making, counseling pertinent to today's visit, and charting.   Jerrett's physical condition is quickly declining.  He appears to be malnourished and probably has electrolyte imbalance.  His weight is 104 pounds and although I do not have a recent documented comparison, he has probably lost 25 pounds in the past 3 months, with the majority of weight loss in the past 2 to 4 weeks.  I strongly recommend he go to the ER now for immediate evaluation.  I spent over 30 minutes discussing the necessity of this, that if electrolytes are out of whack, that can lead to multisystem organ failure and death.  He tells me several times that he had the horrible experience in the ER on 06/04/2021, nothing was done to help him and he refuses to go back.  I recommend he go to the new [facility] on [address], (might not be as busy)  with directions given to his wife Jeremy [name].  He still refuses.  I asked Jeremy [name] to contact his primary provider to see if he could be seen ASAP, no later than 06/23/2021.  She called but no appointments are available.  If he had been able to see a provider there, I would not have continued to press going to the ER.  He called PCP's office yesterday and did make an appt for the end of this month.   I asked Dariel if his children know how sick he is and he said they know he is ill but if they saw him, they would be shocked at how he looks.They would be extremely concerned. His wife is unable to convince him to go to the ER and I told  him I would like to contact one of his children to see if they can talk him into going to the ER. He does not want me to. He continues to refuse going, he is lucid and able to make this decision on his own and I have to abide by his wishes. I could order labs,  however it's Thursday afternoon, results may or may not get to me until Monday. The on-call provider may not be notified of abnormals and even if they were, the recommendations would be the same---go to ER.  The only advice that he seems willing to take is drinking a glass of Pedialyte or Gatorade today.   As far as his mental health is concerned, no changes in treatment. The anxiety has improved although depression has not.  Continue Risperdal 3 mg, 1 p.o. nightly. Continue Ativan 0.5 mg, 1/2-1 p.o. twice daily as needed. Continue Cymbalta 60 mg, 2 p.o. every [name]. Continue mirtazapine 30 mg, 1 p.o. nightly. Continue therapy with Jeremy [name].  Return in 4 weeks.  Donnal Moat, PA-C

## 2021-06-23 ENCOUNTER — Other Ambulatory Visit (INDEPENDENT_AMBULATORY_CARE_PROVIDER_SITE_OTHER): Payer: Medicare Other

## 2021-06-23 ENCOUNTER — Ambulatory Visit (INDEPENDENT_AMBULATORY_CARE_PROVIDER_SITE_OTHER): Payer: Medicare Other | Admitting: Physician Assistant

## 2021-06-23 ENCOUNTER — Encounter: Payer: Self-pay | Admitting: *Deleted

## 2021-06-23 VITALS — HR 99 | Ht 65.0 in | Wt 102.6 lb

## 2021-06-23 DIAGNOSIS — R634 Abnormal weight loss: Secondary | ICD-10-CM

## 2021-06-23 DIAGNOSIS — E640 Sequelae of protein-calorie malnutrition: Secondary | ICD-10-CM

## 2021-06-23 DIAGNOSIS — K5900 Constipation, unspecified: Secondary | ICD-10-CM

## 2021-06-23 DIAGNOSIS — R1310 Dysphagia, unspecified: Secondary | ICD-10-CM

## 2021-06-23 DIAGNOSIS — F322 Major depressive disorder, single episode, severe without psychotic features: Secondary | ICD-10-CM | POA: Diagnosis not present

## 2021-06-23 DIAGNOSIS — E46 Unspecified protein-calorie malnutrition: Secondary | ICD-10-CM

## 2021-06-23 LAB — CBC WITH DIFFERENTIAL/PLATELET
Basophils Absolute: 0 10*3/uL (ref 0.0–0.1)
Basophils Relative: 0.6 % (ref 0.0–3.0)
Eosinophils Absolute: 0 10*3/uL (ref 0.0–0.7)
Eosinophils Relative: 0.3 % (ref 0.0–5.0)
HCT: 41.9 % (ref 39.0–52.0)
Hemoglobin: 14.2 g/dL (ref 13.0–17.0)
Lymphocytes Relative: 10.4 % — ABNORMAL LOW (ref 12.0–46.0)
Lymphs Abs: 0.7 10*3/uL (ref 0.7–4.0)
MCHC: 33.9 g/dL (ref 30.0–36.0)
MCV: 93.1 fl (ref 78.0–100.0)
Monocytes Absolute: 0.6 10*3/uL (ref 0.1–1.0)
Monocytes Relative: 9 % (ref 3.0–12.0)
Neutro Abs: 5.1 10*3/uL (ref 1.4–7.7)
Neutrophils Relative %: 79.7 % — ABNORMAL HIGH (ref 43.0–77.0)
Platelets: 256 10*3/uL (ref 150.0–400.0)
RBC: 4.5 Mil/uL (ref 4.22–5.81)
RDW: 12.9 % (ref 11.5–15.5)
WBC: 6.4 10*3/uL (ref 4.0–10.5)

## 2021-06-23 LAB — COMPREHENSIVE METABOLIC PANEL
ALT: 13 U/L (ref 0–53)
AST: 16 U/L (ref 0–37)
Albumin: 4.1 g/dL (ref 3.5–5.2)
Alkaline Phosphatase: 51 U/L (ref 39–117)
BUN: 17 mg/dL (ref 6–23)
CO2: 33 mEq/L — ABNORMAL HIGH (ref 19–32)
Calcium: 9.7 mg/dL (ref 8.4–10.5)
Chloride: 88 mEq/L — ABNORMAL LOW (ref 96–112)
Creatinine, Ser: 1.32 mg/dL (ref 0.40–1.50)
GFR: 49.89 mL/min — ABNORMAL LOW (ref >60.00)
Glucose, Bld: 144 mg/dL — ABNORMAL HIGH (ref 70–99)
Potassium: 4.1 mEq/L (ref 3.5–5.1)
Sodium: 129 mEq/L — ABNORMAL LOW (ref 135–145)
Total Bilirubin: 0.7 mg/dL (ref 0.2–1.2)
Total Protein: 6.6 g/dL (ref 6.0–8.3)

## 2021-06-23 LAB — B12 AND FOLATE PANEL
Folate: 15.1 ng/mL (ref >5.9)
Vitamin B-12: >1550 pg/mL — ABNORMAL HIGH (ref 211–911)

## 2021-06-23 LAB — IBC + FERRITIN
Ferritin: 240.5 ng/mL (ref 22.0–322.0)
Iron: 98 ug/dL (ref 42–165)
Saturation Ratios: 39.5 % (ref 20.0–50.0)
TIBC: 247.8 ug/dL — ABNORMAL LOW (ref 250.0–450.0)
Transferrin: 177 mg/dL — ABNORMAL LOW (ref 212.0–360.0)

## 2021-06-23 NOTE — Patient Instructions (Signed)
You have been scheduled for a follow up appointment with 07/14/21 at 10:00 am with Ellouise Newer, PA-C.  Your provider has requested that you go to the basement level for lab work before leaving today. Press "B" on the elevator. The lab is located at the first door on the left as you exit the elevator. ______________________________________________________  Jeremy [name] have been scheduled for a Barium Esophogram at [facility] (1st floor of the hospital) on 06/28/21 at 11:00 am. Please arrive 15 minutes prior to your appointment for registration. Make certain not to have anything to eat or drink 3 hours prior to your test. If you need to reschedule for any reason, please contact radiology at [phone] to do so. ________________________________________________________  A barium swallow is an examination that concentrates on views of the esophagus. This tends to be a double contrast exam (barium and two liquids which, when combined, create a gas to distend the wall of the oesophagus) or single contrast (non-ionic iodine based). The study is usually tailored to your symptoms so a good history is essential. Attention is paid during the study to the form, structure and configuration of the esophagus, looking for functional disorders (such as aspiration, dysphagia, achalasia, motility and reflux) EXAMINATION You may be asked to change into a gown, depending on the type of swallow being performed. A radiologist and radiographer will perform the procedure. The radiologist will advise you of the type of contrast selected for your procedure and direct you during the exam. You will be asked to stand, sit or lie in several different positions and to hold a small amount of fluid in your mouth before being asked to swallow while the imaging is performed .In some instances you may be asked to swallow barium coated marshmallows to assess the motility of a solid food bolus. The exam can be recorded as a digital or  video fluoroscopy procedure. POST PROCEDURE It will take 1-2 days for the barium to pass through your system. To facilitate this, it is important, unless otherwise directed, to increase your fluids for the next 24-48hrs and to resume your normal diet.  This test typically takes about 30 minutes to perform. ________________________________________________________ Jeremy [name] have been scheduled for an Upper GI Series at [facility]. Your appointment is on 07/03/21 at 9:30 am. Please arrive 30 minutes prior to your test for registration. Make sure not to eat or drink anything after midnight on the night before your test. If you need to reschedule, please call radiology at [phone]. ________________________________________________________________ An upper GI series uses x rays to help diagnose problems of the upper GI tract, which includes the esophagus, stomach, and duodenum. The duodenum is the first part of the small intestine. An upper GI series is conducted by a radiology technologist or a radiologist--a doctor who specializes in x-ray imaging--at a hospital or outpatient center. While sitting or standing in front of an x-ray machine, the patient drinks barium liquid, which is often white and has a chalky consistency and taste. The barium liquid coats the lining of the upper GI tract and makes signs of disease show up more clearly on x rays. X-ray video, called fluoroscopy, is used to view the barium liquid moving through the esophagus, stomach, and duodenum. Additional x rays and fluoroscopy are performed while the patient lies on an x-ray table. To fully coat the upper GI tract with barium liquid, the technologist or radiologist may press on the abdomen or ask the patient to change position. Patients hold still  in various positions, allowing the technologist or radiologist to take x rays of the upper GI tract at different angles. If a technologist conducts the upper GI series, a radiologist will  later examine the images to look for problems.  This test typically takes about 1 hour to complete. __________________________________________________________________ _  We have given you a clenpiq to use as a bowel purge. Please follow the instructions given on the package.  Please purchase the following medications over the counter and take as directed: Miralax 17 grams (1 capful) dissolved in 8 ounces wat/er/juice once daily.  _______________________________________________________  If you are age 84 or older, your body mass index should be between 23-30. Your Body mass index is 17.07 kg/m. If this is out of the aforementioned range listed, please consider follow up with your Primary Care Provider.  If you are age 84 or younger, your body mass index should be between 19-25. Your Body mass index is 17.07 kg/m. If this is out of the aformentioned range listed, please consider follow up with your Primary Care Provider.   ________________________________________________________  The [facility] providers would like to encourage you to use [facility] to communicate with providers for non-urgent requests or questions.  Due to long hold times on the telephone, sending your provider a message by [facility] may be a faster and more efficient way to get a response.  Please allow 48 business hours for a response.  Please remember that this is for non-urgent requests.  _______________________________________________________ Due to recent changes in healthcare laws, you may see the results of your imaging and laboratory studies on MyChart before your provider has had a chance to review them.  We understand that in some cases there may be results that are confusing or concerning to you. Not all laboratory results come back in the same time frame and the provider may be waiting for multiple results in order to interpret others.  Please give [place] 48 hours in order for your provider to thoroughly review all the  results before contacting the office for clarification of your results.

## 2021-06-23 NOTE — Progress Notes (Signed)
Chief Complaint: Constipation, weight loss and dysphagia  HPI:    [name] is an 86 year old male with a past medical history as listed below including depression and generalized anxiety disorder, who was referred to me by Jeremy Morning, DO for a complaint of constipation, weight loss and dysphagia.    06/04/2021 patient seen in the ED with constipation.  At that time discussed he had a history of depression without active thoughts of harming himself.  He had been having trouble with constipation for a few weeks.  Apparently describes similar problems in the past but not that severe.  He had tried several medications that were no CT abdomen pelvis with contrast showed no evidence of bowel obstruction, large volume of stool in the colon suggesting constipation, trace swelling with ascites, mild cylindrical bronchiectasis and aortic atherosclerosis.  T helping.  Labs at that time including a CMP, urinalysis, lipase and CBC were normal.  He was given an enema in the ED with good results.  He was told to continue MiraLAX twice daily.    06/22/2021 patient followed up with behavioral health in regards to severe depression.  Discussed that he had battled severe depression since October.  Discussed weight loss of about 25 pounds in 3 months.  He was actually directed to go to the ER over fear of abnormal electrolytes.  He refused to go to the ER.  He was told to drink a glass of Pedialyte or Gatorade.  At that time continued on Risperdal, Ativan, Cymbalta, Mirtazapine.    Today, when we start discussing the patient's constipation his wife also assists with his history.  Together they explain that he has had a real turnaround since around October of last year.  Apparently just prior to that was in the senior games and won 3 events in running, after that he had a severe episode of depression and since then everything has been going downhill.  He describes that since November he has had trouble with his bowel  movements noting that he only typically gets a very small amount out or not at all over a couple week timeframe.  He ended up in the ER as above.  They told him to stay on MiraLAX.  His wife tells me that they have taken it for 3 days in a row on 3 separate occasions and it does seem to get some of his stool out, but he has not taken it consistently since about 06/06/21.  Due to this the patient has really stopped eating over the past 2 to 3 weeks because he does not feel good, gets bloated and uncomfortable.  Interestingly also describes that he feels like food just does not even go down into his stomach and that a lot of things just get stuck there.  He has been on mainly liquids due to this.  Tells me he thinks his system is just full up.  Associated symptoms include weakness and dizziness.    Patient has lost about 20 pounds, wife tells me this is just in the past 3 weeks or so.  As far as his depression you can see behavioral health note in his chart but things are not much better.    Denies fever, chills or blood in his stool.  Past Medical History:  Diagnosis Date   Aortic atherosclerosis (HCC)    BPH (benign prostatic hyperplasia)    Bronchiectasis (HCC)    Depression    GAD (generalized anxiety disorder)     No  past surgical history on file.  Current Outpatient Medications  Medication Sig Dispense Refill   B Complex Vitamins (VITAMIN B COMPLEX) TABS Take 1 tablet by mouth daily. (Patient not taking: Reported on 06/22/2021)     calcium-vitamin D (OSCAL WITH D) 500-200 MG-UNIT per tablet Take 1 tablet by mouth daily. (Patient not taking: Reported on 04/11/2021)     cholecalciferol (VITAMIN D3) 25 MCG (1000 UNIT) tablet Take 1,000 Units by mouth daily. (Patient not taking: Reported on 06/22/2021)     DULoxetine (CYMBALTA) 60 MG capsule Take 2 capsules (120 mg total) by mouth every [name]. 180 capsule 1   finasteride (PROSCAR) 5 MG tablet Take 5 mg by mouth every [name].     LORazepam  (ATIVAN) 0.5 MG tablet Take 1/2-1 tablet twice daily as needed for severe anxiety/panic 60 tablet 1   magnesium 30 MG tablet Take 30 mg by mouth daily. (Patient not taking: Reported on 06/22/2021)     mirtazapine (REMERON) 30 MG tablet Take 1 tablet (30 mg total) by mouth at bedtime. 90 tablet 1   Multiple Vitamins-Minerals (MULTIVITAMIN WITH MINERALS) tablet Take 1 tablet by mouth daily. (Patient not taking: Reported on 06/22/2021)     Omega-3 Fatty Acids (FISH OIL BURP-LESS PO) Take by mouth. (Patient not taking: Reported on 06/22/2021)     polyethylene glycol (MIRALAX) 17 g packet Take 17 g by mouth 2 (two) times daily. (Patient taking differently: Take 17 g by mouth daily as needed.) 60 packet 0   risperiDONE (RISPERDAL) 3 MG tablet Take 1 tablet (3 mg total) by mouth at bedtime. 30 tablet 1   saw palmetto 160 MG capsule Take 160 mg by mouth daily. (Patient not taking: Reported on 06/22/2021)     tamsulosin (FLOMAX) 0.4 MG CAPS capsule Take 0.4 mg by mouth every evening. (Patient not taking: Reported on 06/22/2021)     Turmeric (QC TUMERIC COMPLEX PO) Take by mouth. (Patient not taking: Reported on 06/22/2021)     Vitamins C E (VITAMIN C & E COMBINATION PO) Take 1 tablet by mouth daily. (Patient not taking: Reported on 04/11/2021)     ZINC SULFATE PO Take 1 tablet by mouth daily. (Patient not taking: Reported on 06/22/2021)     No current facility-administered medications for this visit.    Allergies as of 06/23/2021 - Review Complete 06/22/2021  Allergen Reaction Noted   Aspergillus allergy skin test Cough 03/23/2019   Aspirin  05/13/2013   Sulfa antibiotics Other (See Comments) 05/13/2013   Theophyllines Other (See Comments) 05/13/2013    No family history on file.  Social History   Socioeconomic History   Marital status: Married    Spouse name: Not on file   Number of children: Not on file   Years of education: Not on file   Highest education level: Not on file  Occupational History    Not on file  Tobacco Use   Smoking status: Never   Smokeless tobacco: Never  Substance and Sexual Activity   Alcohol use: Yes    Alcohol/week: 4.0 standard drinks    Types: 4 Glasses of wine per week   Drug use: No   Sexual activity: Not on file  Other Topics Concern   Not on file  Social History Narrative   Not on file   Social Determinants of Health   Financial Resource Strain: Not on file  Food Insecurity: Not on file  Transportation Needs: Not on file  Physical Activity: Not on file  Stress: Not  on file  Social Connections: Not on file  Intimate Partner Violence: Not on file    Review of Systems:    Constitutional: No fever or chills Skin: No rash  Cardiovascular: No chest pain Respiratory: No SOB Gastrointestinal: See HPI and otherwise negative Genitourinary: No dysuria Neurological: +dizziness Musculoskeletal: No new muscle or joint pain Hematologic: No bleeding  Psychiatric: +depression   Physical Exam:  Vital signs: Pulse 99    Ht 5\' 5"  (1.651 m)    Wt 102 lb 9.6 oz (46.5 kg)    SpO2 99%    BMI 17.07 kg/m  BP-Unable to get due to soft pressures  Constitutional:   Pleasant thins appearing, cachectic, elderly Caucasian male appears to be in NAD, Well developed, Well nourished, alert and cooperative Head:  Normocephalic and atraumatic. Eyes:   PEERL, EOMI. No icterus. Conjunctiva pink. Ears:  Normal auditory acuity. Neck:  Supple Throat: Oral cavity and pharynx without inflammation, swelling or lesion.  Respiratory: Respirations even and unlabored. Lungs clear to auscultation bilaterally.   No wheezes, crackles, or rhonchi.  Cardiovascular: Normal S1, S2. No MRG. Regular rate and rhythm. No peripheral edema, cyanosis or pallor.  Gastrointestinal:  Soft, nondistended, mild epigastric ttp, No rebound or guarding. Normal bowel sounds. No appreciable masses or hepatomegaly. Rectal:  Not performed.  Msk:  Symmetrical without gross deformities. Without edema, no  deformity or joint abnormality.  Neurologic:  Alert and  oriented x4;  grossly normal neurologically.  Skin:   Dry and intact without significant lesions or rashes. Psychiatric: Demonstrates good judgement and reason.+slowed cognition, blank stare at times  RELEVANT LABS AND IMAGING: CBC    Component Value Date/Time   WBC 4.0 06/04/2021 0911   RBC 4.49 06/04/2021 0911   HGB 14.7 06/04/2021 0911   HGB 14.0 05/04/2021 1428   HCT 41.9 06/04/2021 0911   HCT 40.0 05/04/2021 1428   PLT 223 06/04/2021 0911   PLT 277 05/04/2021 1428   MCV 93.3 06/04/2021 0911   MCV 92 05/04/2021 1428   MCH 32.7 06/04/2021 0911   MCHC 35.1 06/04/2021 0911   RDW 11.8 06/04/2021 0911   RDW 11.6 05/04/2021 1428   LYMPHSABS 1.0 05/04/2021 1428   EOSABS 0.1 05/04/2021 1428   BASOSABS 0.0 05/04/2021 1428    CMP     Component Value Date/Time   NA 136 06/04/2021 0911   NA 131 (L) 05/04/2021 1428   K 3.9 06/04/2021 0911   CL 97 (L) 06/04/2021 0911   CO2 28 06/04/2021 0911   GLUCOSE 123 (H) 06/04/2021 0911   BUN 9 06/04/2021 0911   BUN 15 05/04/2021 1428   CREATININE 1.02 06/04/2021 0911   CALCIUM 9.3 06/04/2021 0911   PROT 6.4 (L) 06/04/2021 0911   PROT 6.7 05/04/2021 1428   ALBUMIN 4.0 06/04/2021 0911   ALBUMIN 4.6 05/04/2021 1428   AST 24 06/04/2021 0911   ALT 21 06/04/2021 0911   ALKPHOS 52 06/04/2021 0911   BILITOT 1.1 06/04/2021 0911   BILITOT 0.2 05/04/2021 1428   GFRNONAA >60 06/04/2021 0911   GFRAA >90 05/13/2013 1648    Assessment: 1.  Constipation: Worse over the past 2 months, likely in part due to decrease in diet with some question about dysphagia which could likely be related to this, patient does not feel he is able to eat solid foods, also has an image of "filling up" in his intestines; likely due to decrease in diet from possible dysphagia and depression 2.  Dysphagia: Patient describes that  he feels like food gets stuck in his esophagus on the way down and it just will not go  down, he has been on a mainly liquid diet due to this, initially thought this was due to his constipation but described that this is a separate issue; consider dysmotility +/- stricture 3.  Weight loss: 20 pounds in the past 3 weeks; likely multifactorial with above and depression 4.  Severe depression  Plan: 1.  Recheck CBC, CMP, B12 and folate as well as iron studies today. 2.  Scheduled patient for a barium swallow and upper GI study for symptom of dysphagia and weight loss. 3.  Instructed the patient to do a bowel purge this evening and tomorrow.  Provided him with a bowel prep sample.  After this is completed he should start taking MiraLAX daily to ensure regular bowel movements.  I feel that part of what is hindering him as the thought that he is backed up, hopefully this will help with that and he will feel like he can eat. 4.  Also discussed that the dysphagia symptom he describes is completely separate from his constipation and this could also be aiding to the fact that he does not feel like eating.  We are evaluating this further with barium swallow above. 5.  Discussed with the patient how serious his condition is.  He has declined greatly over the past 3 weeks due to his inability to eat.  Discussed that constipation is likely in part just due to this.  Encouraged him to drink liquids and boost/Ensure to supplement his diet if he is able to get these in. 6.  Patient was advised to go to the ER if he has any decline in symptoms.  What he is experiencing could decline quickly, we were unable to get his blood pressure in clinic today.  Discussed how important it is for him to get something to eat/fluids. 7.  Patient to follow in clinic with me in 3 to 4 weeks or sooner if necessary. He wished to be assigned to Dr. Hilarie Fredrickson today.  Ellouise Newer, PA-C [facility] 06/23/2021, 11:28 AM  Cc: Jeremy Morning, DO

## 2021-06-26 ENCOUNTER — Telehealth: Payer: Self-pay

## 2021-06-26 NOTE — Telephone Encounter (Signed)
See other phone note

## 2021-06-26 NOTE — Progress Notes (Signed)
Addendum: Reviewed and agree with assessment and management plan. Marquie Aderhold M, MD  

## 2021-06-26 NOTE — Telephone Encounter (Signed)
Spoke to patient and encouraged him ( with Ellouise Newer PA comments and concerns) to have at least the [facility]. He still refuses, stating his constipation is too bad. I asked if he has tried Miralax purge and he states he has tried that and many other things.

## 2021-06-26 NOTE — Telephone Encounter (Signed)
Left message for patient to please call back. 

## 2021-06-26 NOTE — Telephone Encounter (Signed)
Patient returned your call, please advise. 

## 2021-06-26 NOTE — Telephone Encounter (Signed)
Returned patient's call and got voice mail. (Phone tag). Left message to please call back.

## 2021-06-26 NOTE — Telephone Encounter (Signed)
Patient returned call

## 2021-06-27 NOTE — Telephone Encounter (Signed)
This has been addressed. Please see 06/26/21 phone note for details.

## 2021-06-28 ENCOUNTER — Other Ambulatory Visit (HOSPITAL_COMMUNITY): Payer: Medicare Other

## 2021-06-30 ENCOUNTER — Other Ambulatory Visit: Payer: Self-pay | Admitting: Physician Assistant

## 2021-07-03 ENCOUNTER — Ambulatory Visit: Payer: Medicare Other | Admitting: Psychiatry

## 2021-07-03 ENCOUNTER — Other Ambulatory Visit (HOSPITAL_COMMUNITY): Payer: Medicare Other

## 2021-07-03 ENCOUNTER — Telehealth (INDEPENDENT_AMBULATORY_CARE_PROVIDER_SITE_OTHER): Payer: Medicare Other | Admitting: Psychiatry

## 2021-07-03 DIAGNOSIS — F322 Major depressive disorder, single episode, severe without psychotic features: Secondary | ICD-10-CM

## 2021-07-03 DIAGNOSIS — R5381 Other malaise: Secondary | ICD-10-CM | POA: Diagnosis not present

## 2021-07-03 DIAGNOSIS — R634 Abnormal weight loss: Secondary | ICD-10-CM | POA: Diagnosis not present

## 2021-07-03 DIAGNOSIS — Z8659 Personal history of other mental and behavioral disorders: Secondary | ICD-10-CM

## 2021-07-03 DIAGNOSIS — R5383 Other fatigue: Secondary | ICD-10-CM

## 2021-07-03 DIAGNOSIS — F411 Generalized anxiety disorder: Secondary | ICD-10-CM

## 2021-07-03 DIAGNOSIS — K5900 Constipation, unspecified: Secondary | ICD-10-CM | POA: Diagnosis not present

## 2021-07-03 DIAGNOSIS — E871 Hypo-osmolality and hyponatremia: Secondary | ICD-10-CM

## 2021-07-03 NOTE — Telephone Encounter (Addendum)
Admin note for non-service contact  Patient ID: Jeremy [name]  MRN: [id number] DATE: 07/03/2021  1pm session accepted on an outreach basis by wife, Jeremy [name], late last week.  Cancellation call from [place], 12:36pm, saying he was sick.  Based on significant concern that he is self-sabotaging care and that he is able to get away with it by assuring wife he will be OK, attempted call to wife 1:12p.  Reached voicemail, LM asking she call in to report status soon.  [name] himself calls back 1:40p.  Says he called off because he's feeling faint, wasn't sure he could handle, but now he's thought better of it, figures he made a mistake.  Surprised to have daughter Jeremy [name] and son in law come in for a week, then son Jeremy [name].  Not able to enjoy them that much, normalized as being in weakened condition.  Obviously their presence helped [facility] cope.  She has been getting on him to eat some solid food now.  Says Jeremy [name] did obtain Pedialyte, and his tests are showing low sodium, both ER and last week at GI.  Encouraged continue to use as needed, but get back to solid food.  Recommended use salt liberally to remedy low sodium.  Has appt tomorrow morning for Dr. Theda Sers (PCP at [facility]).  Says he had a good "cleanout" with Miralax regimen, once he used it enough.  Not clear at this point about discontinue instructions, and chart shows several messages over a week old about getting a swallow study and objections.  Says 1 week now since last he used Miralax, but has had some persistent watery stool.  Admits not enough food, protein, or fat for weeks now.  Still focused on swallowing problem, admits he called off the barium swallow on the presumption that he would have to swallow more than he can to make it valid.  Says large pills have a hard time making it down.  Does have a hx of hiatal hernia, and pre-Barrett's, strong suspicion he has esophageal sphincter narrowing.    Discussed tactics, likelihood he has resolved  constipation now and is denying himself needed nutrition, follow through with various medical appointments.  Dxs:   ICD-10-CM   1. Severe depression ([place])  F32.2     2. Generalized anxiety disorder  F41.1     3. Loss of weight  R63.4     4. Malaise and fatigue  R53.81    R53.83     5. Constipation, unspecified constipation type  K59.00     6. History of psychosis  Z86.59     7. Hyponatremia  E87.1      Plan: - obtain nutritional drink if cannot abide solid food yet -- emphasize enough protein and fat - maintain fluids, especially if still passing watery stool - hold Miralax unless instructed by physician - keep PCP appt tomorrow - use salt liberally to restore sodium, if still needed according to medical personnel - go ahead and get the barium swallow test -- it will be important to r/o an issue with esophageal sphincter (or other issues, given hx of pre-Barrett's), and they will be able to tell you if you need to swallow more or not in order to make the test work - Currently scheduled weekly with therapy, 2/15 with psychiatry, tomorrow with PCP, 2/10 with GI  Blanchie Serve, PhD [name], PhD LP Clinical Psychologist, [facility] [address] [place], [place] [address] [phone]

## 2021-07-03 NOTE — Addendum Note (Signed)
Addended by: Blanchie Serve A on: 07/03/2021 02:21 PM   Modules accepted: Level of Service

## 2021-07-03 NOTE — Telephone Encounter (Signed)
Psychotherapy Progress Note [facility] Jeremy Moore, PhD LP  Patient ID: Jeremy [name] [name])    MRN: [id number] Therapy format: Individual psychotherapy Date: 07/03/2021      Start: 1:40p     Stop: 2:20p     Time Spent: 40 min Location: Telehealth visit -- I connected with this patient by an approved telecommunication method (audio only), with his informed consent, and verifying identity and patient privacy.  I was located at my office and patient at his home.  As needed, we discussed the limitations, risks, and security and privacy concerns associated with telehealth service, including the availability and conditions which currently govern in-person appointments and the possibility that 3rd-party payment may not be fully guaranteed and he may be responsible for charges.  After he indicated understanding, we proceeded with the session.  Also discussed treatment planning, as needed, including ongoing verbal agreement with the plan, the opportunity to ask and answer all questions, his demonstrated understanding of instructions, and his readiness to call the office should symptoms worsen or he feels he is in a crisis state and needs more immediate and tangible assistance.   Session narrative (presenting needs, interim history, self-report of stressors and symptoms, applications of prior therapy, status changes, and interventions made in session) 1pm session accepted on an outreach basis by wife, Jeremy [name], late last week.  Cancellation call from Jeremy [name], 12:36pm, saying he was sick.  Based on significant concern that he is self-sabotaging care and that he is able to get away with it by assuring wife he will be OK, attempted call to wife 1:12p.  Reached voicemail, LM asking she call in to report status soon.   Jeremy [name] himself calls back 1:40p.  Says he called off because he's feeling faint, wasn't sure he could handle, but now he's thought better of it, figures he made a mistake.  Surprised to  have daughter Jeremy [name] and son in law come in for a week, then son Jeremy [name].  Not able to enjoy them that much, normalized as being in weakened condition.  Obviously their presence helped Jeremy [name] cope.  She has been getting on him to eat some solid food now.  Says Jeremy [name] did obtain Pedialyte, and his tests are showing low sodium, both ER and last week at GI.  Encouraged continue to use as needed, but get back to solid food.   Recommended use salt liberally to remedy low sodium.  Has appt tomorrow morning for Jeremy [name] (PCP at [name]).  Says he had a good "cleanout" with Miralax regimen, once he used it enough.  Not clear at this point about discontinue instructions, and chart shows several messages over a week old about getting a swallow study and objections.  Says 1 week now since last he used Miralax, but has had some persistent watery stool.  Admits not enough food, protein, or fat for weeks now.  Still focused on swallowing problem, admits he called off the barium swallow on the presumption that he would have to swallow more than he can to make it valid.  Says large pills have a hard time making it down.  Does have a hx of hiatal hernia, and pre-Barrett's, strong suspicion he has esophageal sphincter narrowing.     Discussed tactics, likelihood he has resolved constipation now and is denying himself needed nutrition, follow through with various medical appointments.  Therapeutic modalities: Cognitive Behavioral Therapy, Solution-Oriented/Positive Psychology, and Psycho-education/Bibliotherapy  Mental Status/Observations:  Appearance:   NA  Behavior:  Appropriate  Motor:  N/a  Speech/Language:   Clear/cogent, hoarse, slowed  Affect:  N/A  Mood:  anxious  Thought process:  More mild blocking  Thought content:    worry  Sensory/Perceptual disturbances:    intact  Orientation:  intact  Attention:  Good    Concentration:  Fair  Memory:  Grossly intact  Insight:    Fair   Judgment:   Fair  Impulse Control:  Good   Risk Assessment: Danger to Self: No Self-injurious Behavior: No Danger to Others: No Physical Aggression / Violence: No Duty to Warn: No Access to Firearms a concern: No  Assessment of progress:  stabilized  Diagnosis:   ICD-10-CM   1. Severe depression ([place])  F32.2     2. Generalized anxiety disorder  F41.1     3. Loss of weight  R63.4     4. Malaise and fatigue  R53.81    R53.83     5. Constipation, unspecified constipation type  K59.00     6. History of psychosis  Z86.59     7. Hyponatremia  E87.1      Plan:  obtain nutritional drink if cannot abide solid food yet -- emphasize enough protein and fat maintain fluids, especially if still passing watery stool hold Miralax unless instructed by physician keep PCP appt tomorrow use salt liberally to restore sodium, if still needed according to medical personnel go ahead and get the barium swallow test -- it will be important to r/o an issue with esophageal sphincter (or other issues, given hx of pre-Barrett's), and they will be able to tell you if you need to swallow more or not in order to make the test work Currently scheduled weekly with therapy, 2/15 with psychiatry, tomorrow with PCP, 2/10 with GI Other recommendations/advice as may be noted above Continue to utilize previously learned skills ad lib Maintain medication as prescribed and work faithfully with relevant prescriber(s) if any changes are desired or seem indicated Call the clinic on-call service, 988/hotline, 911, or present to [facility] or ER if any life-threatening psychiatric crisis  Jeremy Serve, PhD Jeremy Moore, PhD LP Clinical Psychologist, [place] [facility] [address] [place], [place] [address] [phone]

## 2021-07-05 DIAGNOSIS — F339 Major depressive disorder, recurrent, unspecified: Secondary | ICD-10-CM | POA: Diagnosis not present

## 2021-07-05 DIAGNOSIS — K5900 Constipation, unspecified: Secondary | ICD-10-CM | POA: Diagnosis not present

## 2021-07-05 DIAGNOSIS — R634 Abnormal weight loss: Secondary | ICD-10-CM | POA: Diagnosis not present

## 2021-07-05 DIAGNOSIS — R1310 Dysphagia, unspecified: Secondary | ICD-10-CM | POA: Diagnosis not present

## 2021-07-05 DIAGNOSIS — R630 Anorexia: Secondary | ICD-10-CM | POA: Diagnosis not present

## 2021-07-10 ENCOUNTER — Other Ambulatory Visit: Payer: Self-pay

## 2021-07-10 ENCOUNTER — Ambulatory Visit (INDEPENDENT_AMBULATORY_CARE_PROVIDER_SITE_OTHER): Payer: BLUE CROSS/BLUE SHIELD | Admitting: Psychiatry

## 2021-07-10 DIAGNOSIS — R64 Cachexia: Secondary | ICD-10-CM | POA: Diagnosis not present

## 2021-07-10 DIAGNOSIS — Z8659 Personal history of other mental and behavioral disorders: Secondary | ICD-10-CM | POA: Diagnosis not present

## 2021-07-10 DIAGNOSIS — R5383 Other fatigue: Secondary | ICD-10-CM

## 2021-07-10 DIAGNOSIS — F411 Generalized anxiety disorder: Secondary | ICD-10-CM | POA: Diagnosis not present

## 2021-07-10 DIAGNOSIS — F322 Major depressive disorder, single episode, severe without psychotic features: Secondary | ICD-10-CM

## 2021-07-10 DIAGNOSIS — Z9189 Other specified personal risk factors, not elsewhere classified: Secondary | ICD-10-CM | POA: Diagnosis not present

## 2021-07-10 DIAGNOSIS — K5900 Constipation, unspecified: Secondary | ICD-10-CM | POA: Diagnosis not present

## 2021-07-10 DIAGNOSIS — R5381 Other malaise: Secondary | ICD-10-CM | POA: Diagnosis not present

## 2021-07-10 NOTE — Progress Notes (Signed)
Psychotherapy Progress Note [facility] Jeremy Moore, PhD LP  Patient ID: Jeremy [name] [facility])    MRN: [id number] Therapy format: Family therapy w/ patient -- accompanied by Jeremy [name] Date: 07/10/2021      Start: 10:15p     Stop: 11:05a     Time Spent: 50 min Location: In-person   Session narrative (presenting needs, interim history, self-report of stressors and symptoms, applications of prior therapy, status changes, and interventions made in session) Thin, frail looking, with walking stick, a little wobbly on his feet.  Driven by wife Jeremy [name].  Seems to only be getting clear fluids for the most part, some Ensure (est 3/4 bottle per day).  Claims no BMs for a week despite 1 Miralax daily.  Admittedly dehydrated, urinating dark tan and only 2/day.  Still concerned about constipation, based on still feeling bloated and not passing stool he can detect or recall, but it sounds like he is much more likely just not eating enough at all.  Did feel a little hungry earlier, wanted a banana.  Affirmed as good sign.  Admittedly shaky walking.  Stumbled yesterday in the house, fell.  Regular with meds 8pm, gets in bed, falls asleep est midnight/1am until about 6/7am.  Est 5-6 hr sleep.  No c/o wakefulness.  Last summer was getting up 4am, bed c. 7/8pm.  Daytime spent in dim light, sounds like, bedroom shady side of the house.    Interpreted stomach shrinkage causing full feeling, not constipation.  Discussed clear need to resume some solid food and truly adequate fluids.  Brainstormed a few foods he could trust and renew.  Today, acknowledges hankering for a hamburger and fries, actually.    Brought Jeremy [name] in, reviewed with her and educated why what she views as junk food would actually be OK.  Interpreted Jeremy [name]'s perceptions as instinctive "famine" response to kill appetite when food is not available.  Framed measures including hamburger as ways to get acquainted with reality and switch it off.   Jeremy [name] oddly pursues a line of inquiry as to whether Jeremy [name]'s failure to eat is an attempt at slow suicide.  Plausible, but nothing to be gained by conceptualizing it in front of him -- the emphasis should be on improving health habits until it's clear whether he requires hospitalization or has adequately prevented it.  Therapeutic modalities: Cognitive Behavioral Therapy and Solution-Oriented/Positive Psychology  Mental Status/Observations:  Appearance:   Casual and Neat , very thin  Behavior:  Rigid  Motor:  Tremor  Speech/Language:   Some halting  Affect:  Constricted  Mood:  depressed  Thought process:  Some blocking  Thought content:    Rumination  Sensory/Perceptual disturbances:    WNL  Orientation:  Fully oriented  Attention:  Good    Concentration:  Fair  Memory:  WNL  Insight:    Fair  Judgment:   Fair  Impulse Control:  Good   Risk Assessment: Danger to Self: No Self-injurious Behavior: No Danger to Others: No Physical Aggression / Violence: No Duty to Warn: No Access to Firearms a concern: No  Assessment of progress:  deteriorating -- in need of reassessment  Diagnosis:   ICD-10-CM   1. Severe depression ([place])  F32.2     2. Generalized anxiety disorder  F41.1     3. Cachexia ([place])  R64     4. Malaise and fatigue  R53.81    R53.83     5. Constipation, unspecified constipation type  K59.00  resolved but believes it continues    6. Potential for self care deficit  Z91.89     7. History of psychosis  Z86.59      Plan:  Today, hamburger and fries Dietary ideas -- banana, full fat yogurt, applesauce, Ensure, eggs, broth Jeremy [name] get 15 minutes minimum in the morning out of bed, in the brighter front of the house.  If still wants to nap, do it in better-lit area.   Follow through with barium test -- seriously doubt this is actual constipation now, but stomach shrunken, and the material will pass For [place], do not put much thought into analyzing motives, just  act for Telly's wellbeing.  Consider hospitalization if further wasting. Other recommendations/advice as may be noted above Continue to utilize previously learned skills ad lib Maintain medication as prescribed and work faithfully with relevant prescriber(s) if any changes are desired or seem indicated Call the clinic on-call service, 988/hotline, 911, or present to [facility] or ER if any life-threatening psychiatric crisis No follow-ups on file. Already scheduled visit in this office 07/17/2021.  Jeremy Serve, PhD Jeremy Moore, PhD LP Clinical Psychologist, [facility] [address], [place], [place] [address] [phone]

## 2021-07-14 ENCOUNTER — Ambulatory Visit: Payer: Medicare Other | Admitting: Physician Assistant

## 2021-07-17 ENCOUNTER — Other Ambulatory Visit: Payer: Self-pay

## 2021-07-17 ENCOUNTER — Ambulatory Visit (INDEPENDENT_AMBULATORY_CARE_PROVIDER_SITE_OTHER): Payer: BLUE CROSS/BLUE SHIELD | Admitting: Psychiatry

## 2021-07-17 DIAGNOSIS — R64 Cachexia: Secondary | ICD-10-CM | POA: Diagnosis not present

## 2021-07-17 DIAGNOSIS — Z9189 Other specified personal risk factors, not elsewhere classified: Secondary | ICD-10-CM | POA: Diagnosis not present

## 2021-07-17 DIAGNOSIS — F322 Major depressive disorder, single episode, severe without psychotic features: Secondary | ICD-10-CM | POA: Diagnosis not present

## 2021-07-17 DIAGNOSIS — K5900 Constipation, unspecified: Secondary | ICD-10-CM

## 2021-07-17 DIAGNOSIS — F411 Generalized anxiety disorder: Secondary | ICD-10-CM | POA: Diagnosis not present

## 2021-07-17 NOTE — Patient Instructions (Addendum)
Constipation  self-check rectum, use stool softening suppository and/or enema if indicated Flexibility exercises to limber trunk Do follow Miralax recommendations, reliably, to get stool going, if it is in fact backed up Get weights each morning and log Reschedule with PCP or GI ASAP for re-evaluation May require medical admission at their judgment Activity/light Do try to get a minimum of 15 minutes out of bed in the morning and in the light Try to walk a bit, something every day, even if it's 10 minutes Good idea to get up 10 minutes per hour, at least Still see friends/guests further, as can be arranged Nutrition maintain adequate calories, ice cream and any other full fat foods are beneficial Keep pushing variety of fluids, enough for the whole day, shoot for 100 oz. Home care -- if needed, ask primary care doctor about visiting nursing or physical therapy if appropriate

## 2021-07-17 NOTE — Progress Notes (Signed)
Psychotherapy Progress Note [facility] Jeremy Moore, PhD LP  Patient ID: Jeremy [name])    MRN: [id number] Therapy format: Family therapy w/ patient -- accompanied by Jeremy [name], part time Date: 07/17/2021      Start: 10:07a     Stop: 11:00a     Time Spent: 53 min Location: In-person   Session narrative (presenting needs, interim history, self-report of stressors and symptoms, applications of prior therapy, status changes, and interventions made in session) Seen individually to begin with.  Says Jeremy [name] is making he sure he takes in something every hour.  Is getting Ensure, now carrot juice.  Est 60oz. in a day total fluid.  Alleges solid food but no stool for well over a week now.  Says the hamburger a week ago was a bad idea, it went in but nothing came out, denies any stool whatsoever.  Says Jeremy [name] makes soups, has brought home ice cream, she's pushing ice cream, which he figures will be bad for his teeth, even though he acknowledges brushing.  Confronted with near-impossibility of reconciling what he says for input and output.  Says he cancelled the GI study and the GI visit because he didn't do the study so there wouldn't be any point in meeting if he didn't follow up on her recommended test.  Confronted that he has an active GI issue of one kind or the other, and he actually needs physical exam to determine whether he is actually constipated, against the odds, or denying himself so much solid that there is nothing to pass and under the illusion of constipation because his stomach has shrunk and he believes he is constipated.  Confronted need for calories, nutrition, and movement, as he does spend nearly all of his time in bed now.  Jeremy [name] says he refuses food for the most part, based on feeling constipated, est consuming 500 cal or less, all fluid so far as she can tell.  Confirms he did entertain church friends for about an hour last week, was alert and cogent for it.  Says  he was not able to get out of bed appreciably in mornings since last Monday to fulfill out of bed challenge.  Renewed the challenge.  Jeremy [name] admits getting irritable at times trying to get Jeremy [name] going for self-care.  Suggested weighing QAM to track actual weight, help determine whether he is successfully gaining or continuing to lose, but she balks at the idea as simply bad for someone with an eating disorder.  Clarified that that applies to actual anorexics who are obsessed with bringing down body fat for self-image, since it feeds the obsession, but as hard data on a depressed patient wasting, it is not that sort of problem.  More optional than light, intake, and activity, but helpful.  Confronted both that if he does not go against his own feelings enough to counter depression and anorexia, he may require hospitalization to correct it.  Assured that it would not be the ED experience of waiting while listening to distressed people with nothing else to do, or getting an enema, provided it is not needed.  Encouraged to self-check for hard stool, use softener if indicated, and resume Miralax if in fact constipated.  Therapeutic modalities: Cognitive Behavioral Therapy and Solution-Oriented/Positive Psychology  Mental Status/Observations:  Appearance:   Neat and emaciated      Behavior:  tentative  Motor:  Stiff, tentative gait  Speech/Language:   Fluent , a little hesitating  Affect:  Constricted  Mood:  anxious and depressed  Thought process:  Some blocking  Thought content:    Obsessions  Sensory/Perceptual disturbances:    Illusions  Orientation:  grossly intact  Attention:  Good    Concentration:  Good  Memory:  grossly intact  Insight:    Fair  Judgment:   Fair  Impulse Control:  Fair   Risk Assessment: Danger to Self: No Self-injurious Behavior:  food avoidance Danger to Others: No Physical Aggression / Violence: No Duty to Warn: No Access to Firearms a concern: No  Assessment of  progress:  deteriorating -- in need of reassessment  Diagnosis:   ICD-10-CM   1. Severe depression ([place])  F32.2     2. Generalized anxiety disorder  F41.1     3. Cachexia ([place])  R64     4. Potential for self care deficit  Z91.89     5. Constipation, unspecified constipation type  K59.00    alleged, needs medical exam to verify     Plan:  Constipation  self-check rectum, use stool softening suppository and/or enema if indicated Flexibility exercises to limber trunk Do follow Miralax recommendations, reliably, to get stool going, if it is in fact backed up Get weights each morning and log Reschedule with PCP or GI ASAP for re-evaluation May require medical admission at their judgment Activity/light Do try to get a minimum of 15 minutes out of bed in the morning and in the light Try to walk a bit, something every day, even if it's 10 minutes Good idea to get up 10 minutes per hour, at least Still see friends/guests further, as can be arranged Nutrition maintain adequate calories, ice cream and any other full fat foods are beneficial Keep pushing variety of fluids, enough for the whole day, shoot for 100 oz. Home care -- if needed, ask primary care doctor about visiting nursing or physical therapy if appropriate Other recommendations/advice as may be noted above Continue to utilize previously learned skills ad lib Maintain medication as prescribed and work faithfully with relevant prescriber(s) if any changes are desired or seem indicated Call the clinic on-call service, 988/hotline, 911, or present to [facility] or ER if any life-threatening psychiatric crisis Return for time as available. Already scheduled visit in this office 07/19/2021.  Jeremy Serve, PhD Jeremy Moore, PhD LP Clinical Psychologist, [facility] [address] [place], [place] [address] [phone]

## 2021-07-19 ENCOUNTER — Encounter: Payer: Self-pay | Admitting: Physician Assistant

## 2021-07-19 ENCOUNTER — Other Ambulatory Visit: Payer: Self-pay

## 2021-07-19 ENCOUNTER — Ambulatory Visit (INDEPENDENT_AMBULATORY_CARE_PROVIDER_SITE_OTHER): Payer: Medicare Other | Admitting: Physician Assistant

## 2021-07-19 VITALS — Wt 99.4 lb

## 2021-07-19 DIAGNOSIS — K5900 Constipation, unspecified: Secondary | ICD-10-CM

## 2021-07-19 DIAGNOSIS — F332 Major depressive disorder, recurrent severe without psychotic features: Secondary | ICD-10-CM | POA: Diagnosis not present

## 2021-07-19 DIAGNOSIS — F411 Generalized anxiety disorder: Secondary | ICD-10-CM

## 2021-07-19 DIAGNOSIS — R5383 Other fatigue: Secondary | ICD-10-CM | POA: Diagnosis not present

## 2021-07-19 DIAGNOSIS — R634 Abnormal weight loss: Secondary | ICD-10-CM | POA: Diagnosis not present

## 2021-07-19 DIAGNOSIS — R64 Cachexia: Secondary | ICD-10-CM

## 2021-07-19 DIAGNOSIS — R5381 Other malaise: Secondary | ICD-10-CM

## 2021-07-19 MED ORDER — RISPERIDONE 3 MG PO TABS: 3.0000 mg | ORAL_TABLET | Freq: Every day | ORAL | 5 refills | Status: DC

## 2021-07-19 NOTE — Progress Notes (Signed)
[facility]  Patient ID: Jeremy [name],  MRN: [id number]  PCP: Janie Morning, DO  Date of Evaluation: 07/19/2021 Time spent:40 minutes  Chief Complaint:  Chief Complaint   Anxiety; Depression; Follow-up     HISTORY/CURRENT STATUS: HPI for 1 month med check.  Feels about the same. Is obsessing about bowel movements. He saw the Ellouise Newer, PA-C at [place] on 06/23/21. He did bowel purge as instructed, and felt better for a day or so. Barium swallow and UGI were scheduled, but he cancelled the the barium swallow appt, for fear of the 'barium getting stuck in his colon' and he would not be able to drink enough to clean it all out. Repeatedly says nothing will help.Still hesitant to take MiraLax as directed by GI, 'the bottle says to take only 7 days.' Reports feeling full quickly and his wife is 'forcing food' in him, it makes him feel sick so he can't eat it all. Not compliant with Ensure of Boost either for same reason. (Discussed w/ Dr. Rica Mote, psychologist who has spoken with his wife, was told her might be getting 500 cal in per day. Patient's perception very different)  Taym is having more anxiety, occasional Ativan use which is helpful. Not panicky but in general, feels nervous. Says he's addicted to his bed, depression makes him not want to get up. Energy is extremely low, no motivation, not crying, feels numb. No SI/HI but 'doesn't want to be here.'  Patient denies increased energy with decreased need for sleep, no increased talkativeness, no racing thoughts, no impulsivity or risky behaviors, no increased spending, no increased libido, no grandiosity, no increased irritability or anger, and no hallucinations.  Review of Systems  Constitutional:  Positive for malaise/fatigue and weight loss.  HENT: Negative.    Eyes: Negative.   Respiratory: Negative.    Cardiovascular: Negative.   Gastrointestinal:  Positive for constipation.       Early satiety  Genitourinary:  Negative.   Musculoskeletal: Negative.   Skin: Negative.   Neurological: Negative.   Endo/Heme/Allergies: Negative.   Psychiatric/Behavioral:         See HPI    Individual Medical History/ Review of Systems: Changes? :Yes    see HPI  Past medications for mental health diagnoses include: (None listed in old chart)  Was hospitalized many times since 1964, most recent was in 2005.   Allergies: Aspergillus allergy skin test, Aspirin, Sulfa antibiotics, and Theophyllines  Current Medications:  Current Outpatient Medications:    DULoxetine (CYMBALTA) 60 MG capsule, Take 120 mg by mouth daily., Disp: , Rfl:    finasteride (PROSCAR) 5 MG tablet, Take 5 mg by mouth every morning., Disp: , Rfl:    LORazepam (ATIVAN) 0.5 MG tablet, Take 1/2-1 tablet twice daily as needed for severe anxiety/panic, Disp: 60 tablet, Rfl: 1   mirtazapine (REMERON) 30 MG tablet, Take 1 tablet (30 mg total) by mouth at bedtime., Disp: 90 tablet, Rfl: 1   cholecalciferol (VITAMIN D3) 25 MCG (1000 UNIT) tablet, Take 1,000 Units by mouth daily. (Patient not taking: Reported on 07/19/2021), Disp: , Rfl:    magnesium 30 MG tablet, Take 30 mg by mouth daily. (Patient not taking: Reported on 07/19/2021), Disp: , Rfl:    risperiDONE (RISPERDAL) 3 MG tablet, Take 1 tablet (3 mg total) by mouth at bedtime., Disp: 30 tablet, Rfl: 5   tamsulosin (FLOMAX) 0.4 MG CAPS capsule, Take 0.4 mg by mouth every evening. (Patient not taking: Reported on 07/19/2021), Disp: , Rfl:  Medication Side Effects: none  Family Medical/ Social History: Changes? No  MENTAL HEALTH EXAM:  Weight 99 lb 6.4 oz (45.1 kg).Body mass index is 16.54 kg/m. Lost 4.5 # in 4 wks  General Appearance: Casual, Well Groomed, and cachetic  Eye Contact:  Good  Speech:  Clear and Coherent and Slow  Volume:  Decreased  Mood:  Anxious, Depressed, Hopeless, and Worthless  Affect:  Depressed, Flat, and Anxious  Thought Process:  Goal Directed and Descriptions of  Associations: Circumstantial  Orientation:  Full (Time, Place, and Person)  Thought Content: Illogical, Obsessions, and Rumination   Suicidal Thoughts:  No  Homicidal Thoughts:  No  Memory:  WNL  Judgement:  Impaired  Insight:  Good  Psychomotor Activity:  Decreased and walks very slowly down the hall.  Needs no assistance.  Concentration:  Concentration: Good and Attention Span: Good  Recall:  Good  Fund of Knowledge: Good  Language: Good  Assets:  Desire for Improvement  ADL's:  Intact  Cognition: WNL  Prognosis:  Poor   Labs from 06/23/21 reviewed. Reviewed note of Ellouise Newer, PA-C  DIAGNOSES:    ICD-10-CM   1. Severe episode of recurrent major depressive disorder, without psychotic features ([place])  F33.2     2. Generalized anxiety disorder  F41.1     3. Malaise and fatigue  R53.81    R53.83     4. Loss of weight  R63.4     5. Cachexia ([place])  R64     6. Constipation, unspecified constipation type  K59.00       Receiving Psychotherapy: Yes Dr. Jonni Sanger Mitchum   RECOMMENDATIONS:  PDMP was reviewed. Ativan filled 04/26/2022. I provided 40  minutes of face to face time during this encounter, including time spent before and after the visit in records review, medical decision making, counseling pertinent to today's visit, and charting.  Sasan is aware of the seriousness of his condition.  He has lost almost 5 # in 4 weeks.  Lack of food and drink is not sustainable long term. He says he just can't eat or drink anymore. I strongly encouraged him to call GI to reschedule the barium swallow. "I'll think about it." Also reminded him to follow Anderson [place] Lemmon's treatment plan of miralax and Ensure or Boost   For the anxiety, consider increase risperdal, start Buspar, change Cymbalta to something that would help anxiety better and depression as well. He will not allow me to make any changes.  Continue Risperdal 3 mg, 1 p.o. nightly. Continue Ativan 0.5 mg, 1/2-1 p.o. twice daily  as needed. Continue Cymbalta 60 mg, 2 p.o. every morning. Continue mirtazapine 30 mg, 1 p.o. nightly. Continue therapy with Dr. [name].  Return in 4 weeks.  Donnal Moat, PA-C

## 2021-07-24 ENCOUNTER — Ambulatory Visit: Payer: Medicare Other | Admitting: Psychiatry

## 2021-07-31 ENCOUNTER — Ambulatory Visit (INDEPENDENT_AMBULATORY_CARE_PROVIDER_SITE_OTHER): Payer: Medicare Other | Admitting: Psychiatry

## 2021-07-31 ENCOUNTER — Other Ambulatory Visit: Payer: Self-pay

## 2021-07-31 DIAGNOSIS — Z9189 Other specified personal risk factors, not elsewhere classified: Secondary | ICD-10-CM

## 2021-07-31 DIAGNOSIS — R64 Cachexia: Secondary | ICD-10-CM

## 2021-07-31 DIAGNOSIS — F332 Major depressive disorder, recurrent severe without psychotic features: Secondary | ICD-10-CM | POA: Diagnosis not present

## 2021-07-31 DIAGNOSIS — F411 Generalized anxiety disorder: Secondary | ICD-10-CM

## 2021-07-31 NOTE — Progress Notes (Signed)
Psychotherapy Progress Note [facility] Jeremy Moore, PhD LP  Patient ID: Jeremy [name] [name])    MRN: [id number] Therapy format: Family therapy w/o patient -- Jeremy [name] present Date: 07/31/2021      Start: 10:08a     Stop: 11:11a     Time Spent: 63 min Location: In-person   Session narrative (presenting needs, interim history, self-report of stressors and symptoms, applications of prior therapy, status changes, and interventions made in session) Saw Jeremy [name] 2/15, no other physician visits at all.  Still feels constipated, says Jeremy [name] brings home ice cream and he's "abusing" it, eating up to a pint at a time.  Assured it is a good choice for his current state, providing dense calories, needed fat, tryptophan to make serotonin, and liquid.  Still seems dehydrated -- says urine is still a dark tan.  Weight 99 at last visit with Jeremy [name].  Jeremy [name] in drinking all of the green tea he fixed here, encouraged past his apprehension it would be too hot.  Does say his stomach will feel irritated with almost anything, suggesting possible gastritis.  Est he gets 1/2-1 bottle of Ensure per day.  [employer] cookies.  Limited vegetables, stir fried or roasted, getting a couple bites.  Kale, carrots, broccoli, occasionally mashed sweet potatoes.  Says taste is rather dull, overall, but the sweets still give some mouth pleasure.  Generally, taste is gray.  Encouraged continue rebuilding nutrition, go as far as he can stand with water and solids.  Discussed value of some roughage.  Feel Jeremy [name] might be onto something about starving himself to death.  Acknowledges since 05/10/2023 maybe feeling like dying, but only for being disgusted with his body.  Does recognize "concentration camp" appearance.  Says he's developed the conviction his life is just about over, but it seems based on how he feels, and maybe the state he has gotten himself into bodily.  Confronted not to jump to conclusions, keep rebuilding  physically.  Says he took some 1980s cassettes (video) and got them digitized, for his first grandchild, Jeremy [name], just 60 yo now, ostensibly leading up to a last accomplishment in life.  Says hasn't seen her since 05-10-23, which is when the other grandmother moved into the family house.  Usually went up monthly to see them, at [name] in [name].  Admittedly misses seeing them, even tough he reflexively discounts it because his life feels over.  Jeremy [name] feels good as long as he's the initiator -- concerts, dinner dates, etc.  Both kids did visit, by surprise, felt "dumped on" for not being part of the planning.  Discussed nonverbals and depression's sensitivity to appearances.  With Jeremy [name], reviewed findings, encouraged continue nutrition under way and improvements, still encouraged maintain some weekly contact with one or more of PCP, GI, psychiatry, and therapy to keep momentum toward recovery.  She seems irked at the idea Jeremy [name] may feel marginalized when to her he exempts himself.  Made clear that recommendation to draw him in with family is in no way an accusation of shutting him out, simply that depression tends to take feelings and appearances and jump to conclusions, and it helps to make invitation unambiguous.    Therapeutic modalities: Cognitive Behavioral Therapy and Solution-Oriented/Positive Psychology  Mental Status/Observations:  Appearance:   Casual and Neat   very thin  Behavior:  Appropriate and less inhibited  Motor:  Normal and but shaken  Speech/Language:   Clear, some halting  Affect:  Constricted and  a little more responsive  Mood:  anxious and depressed  Thought process:  Some blocking , less  Thought content:    Rumination  Sensory/Perceptual disturbances:    Believes constipated still  Orientation:  Fully oriented  Attention:  Good    Concentration:  Fair  Memory:  grossly intact  Insight:    Fair  Judgment:   Fair  Impulse Control:  Fair   Risk  Assessment: Danger to Self: No Self-injurious Behavior: via dietary restriction Danger to Others: No Physical Aggression / Violence: No Duty to Warn: No Access to Firearms a concern: No  Assessment of progress:  guarded  Diagnosis:   ICD-10-CM   1. Severe episode of recurrent major depressive disorder, without psychotic features ([place] [name])  F33.2     2. Generalized anxiety disorder  F41.1     3. Cachexia ([place] [name])  R64     4. Potential for self care deficit  Z91.89      Plan:  Should improve hydration -- at risk of muscle loss and kidney damage Continue improvements in solid nutrition, use of nutritional drink -- more if at all possible Trust that he is not constipated -- the full feeling comes from stomach shrinkage during "starvation mode".  If doubt, abide by GI recommendation to daily Miralax, note results, and hydrate very well. Request wife not only "let" Jeremy [name] join calls with family but invite it.  Depression will read into nonverbals, like talking past or around him.  Encourage kids to engage him directly as needed  rather than ask about him. Jeremy [name] go ahead and bee willing to interact even if it feels awkward. Other recommendations/advice as may be noted above Continue to utilize previously learned skills ad lib Maintain medication as prescribed and work faithfully with relevant prescriber(s) if any changes are desired or seem indicated Call the clinic on-call service, 988/hotline, 911, or present to Jeremy [name] or ER if any life-threatening psychiatric crisis Return 1-2 weeks, anything available. Already scheduled visit in this office 08/29/2021.  Jeremy Serve, PhD Jeremy Moore, PhD LP Clinical Psychologist, [facility] [address] [place], [place] [address] [phone]

## 2021-08-14 ENCOUNTER — Ambulatory Visit: Payer: Medicare Other | Admitting: Psychiatry

## 2021-08-15 ENCOUNTER — Ambulatory Visit: Payer: Medicare Other | Admitting: Psychiatry

## 2021-08-29 ENCOUNTER — Encounter: Payer: Self-pay | Admitting: Physician Assistant

## 2021-08-29 ENCOUNTER — Other Ambulatory Visit: Payer: Self-pay

## 2021-08-29 ENCOUNTER — Ambulatory Visit (INDEPENDENT_AMBULATORY_CARE_PROVIDER_SITE_OTHER): Payer: Medicare Other | Admitting: Physician Assistant

## 2021-08-29 VITALS — Wt 102.5 lb

## 2021-08-29 DIAGNOSIS — F411 Generalized anxiety disorder: Secondary | ICD-10-CM

## 2021-08-29 DIAGNOSIS — R5381 Other malaise: Secondary | ICD-10-CM

## 2021-08-29 DIAGNOSIS — F332 Major depressive disorder, recurrent severe without psychotic features: Secondary | ICD-10-CM

## 2021-08-29 DIAGNOSIS — R64 Cachexia: Secondary | ICD-10-CM | POA: Diagnosis not present

## 2021-08-29 DIAGNOSIS — R5383 Other fatigue: Secondary | ICD-10-CM

## 2021-08-29 MED ORDER — RISPERIDONE 3 MG PO TABS: 3.0000 mg | ORAL_TABLET | Freq: Every day | ORAL | 1 refills | Status: DC

## 2021-08-29 MED ORDER — MIRTAZAPINE 30 MG PO TABS: 30.0000 mg | ORAL_TABLET | Freq: Every day | ORAL | 1 refills | Status: DC

## 2021-08-29 NOTE — Progress Notes (Signed)
[facility] ? ?Patient ID: Jeremy [name],  ?MRN: [id number] ? ?PCP: Jeremy Morning, DO ? ?Date of Evaluation: 08/29/2021 ?Time spent:20 minutes ? ?Chief Complaint:  ?Chief Complaint   ?Depression; Insomnia; Anxiety; Follow-up ?  ? ? ?HISTORY/CURRENT STATUS: ?HPI for 1 month med check. ? ?Has gained a few # since last month. Eating ice cream, says he has an eating disorder now. Not eating real food and he knows it's not good for him.  He does not like the fact that he is eating sweets.  His wife does cook and they have "real food" in the fridge but he eats junk food and then is not hungry for a real meal.  Is very disturbed that he is eating like this and knows it is not good for him. ? ?Continues to be depressed.  He stays in bed a lot.  States he and his wife live in the same house but are him totally separate worlds.  He can hear her laughing and talking with someone in the other end of the house while he is in bed.  Still feels addicted to bed.  Not at all interested in getting his flower garden ready which has been his pride and joy for around 10 years.  He has not followed up with GI nor his primary provider.  Personal hygiene and ADLs are normal.  No suicidal or homicidal thoughts. ? ?The anxiety has improved since increasing the Risperdal several months ago.  He does need the Ativan occasionally but not needing it very often now.  Not having panic attacks. ? ?Patient denies increased energy with decreased need for sleep, no increased talkativeness, no racing thoughts, no impulsivity or risky behaviors, no increased spending, no increased libido, no grandiosity, no increased irritability or anger, and no hallucinations. ? ?Denies dizziness, syncope, seizures, numbness, tingling, tremor, tics, unsteady gait, slurred speech, confusion. Denies muscle or joint pain, stiffness, or dystonia. Denies unexplained weight loss, frequent infections, or sores that heal slowly.  No polyphagia, polydipsia, or  polyuria. Denies visual changes or paresthesias.  ? ? ?Individual Medical History/ Review of Systems: Changes? :No     ? ?Past medications for mental health diagnoses include: ?(None listed in old chart) ?Lithium he took only for a few weeks for severe depression with suicidal thoughts but did not like it so he stopped it.  cymbalta, mirtazapine, Ativan, ? ?Was hospitalized many times since 1964, most recent was in 2005.  ? ?Allergies: Aspergillus allergy skin test, Aspirin, Sulfa antibiotics, and Theophyllines ? ?Current Medications:  ?Current Outpatient Medications:  ?  DULoxetine (CYMBALTA) 60 MG capsule, Take 120 mg by mouth daily., Disp: , Rfl:  ?  finasteride (PROSCAR) 5 MG tablet, Take 5 mg by mouth every [name]., Disp: , Rfl:  ?  LORazepam (ATIVAN) 0.5 MG tablet, Take 1/2-1 tablet twice daily as needed for severe anxiety/panic, Disp: 60 tablet, Rfl: 1 ?  cholecalciferol (VITAMIN D3) 25 MCG (1000 UNIT) tablet, Take 1,000 Units by mouth daily. (Patient not taking: Reported on 07/19/2021), Disp: , Rfl:  ?  magnesium 30 MG tablet, Take 30 mg by mouth daily. (Patient not taking: Reported on 07/19/2021), Disp: , Rfl:  ?  mirtazapine (REMERON) 30 MG tablet, Take 1 tablet (30 mg total) by mouth at bedtime., Disp: 90 tablet, Rfl: 1 ?  risperiDONE (RISPERDAL) 3 MG tablet, Take 1 tablet (3 mg total) by mouth at bedtime., Disp: 90 tablet, Rfl: 1 ?  tamsulosin (FLOMAX) 0.4 MG CAPS capsule, Take 0.4 mg  by mouth every evening. (Patient not taking: Reported on 07/19/2021), Disp: , Rfl:  ?Medication Side Effects: none ? ?Family Medical/ Social History: Changes? No ? ?MENTAL HEALTH EXAM: ? ?Weight 102 lb 8 oz (46.5 kg).Body mass index is 17.06 kg/m?Marland Kitchen  He has gained approximately 3 pounds in 6 weeks.  ?General Appearance: Casual, Well Groomed, and cachetic  ?Eye Contact:  Good  ?Speech:  Clear and Coherent and Slow  ?Volume:  Decreased  ?Mood:  Depressed and Hopeless  ?Affect:  Depressed and Flat  ?Thought Process:  Goal  Directed and Descriptions of Associations: Circumstantial  ?Orientation:  Full (Time, Place, and Person)  ?Thought Content: Illogical, Obsessions, and Rumination   ?Suicidal Thoughts:  No  ?Homicidal Thoughts:  No  ?Memory:  WNL  ?Judgement:  Impaired  ?Insight:  Good  ?Psychomotor Activity:  Decreased  ?Concentration:  Concentration: Good and Attention Span: Good  ?Recall:  Good  ?Fund of Knowledge: Good  ?Language: Good  ?Assets:  Communication Skills ?Financial Resources/Insurance ?Housing ?Transportation  ?ADL's:  Intact  ?Cognition: WNL  ?Prognosis:  Poor  ? ? ?DIAGNOSES:  ?  ICD-10-CM   ?1. Severe episode of recurrent major depressive disorder, without psychotic features ([place])  F33.2   ?  ?2. Generalized anxiety disorder  F41.1   ?  ?3. Cachexia ([place])  R64   ?  ?4. Malaise and fatigue  R53.81   ? R53.83   ?  ? ? ? ?Receiving Psychotherapy: Yes Dr. Jonni Sanger [name]  ? ?RECOMMENDATIONS:  ?PDMP was reviewed. Ativan filled 04/26/2021. ?I provided 20 minutes of face to face time during this encounter, including time spent before and after the visit in records review, medical decision making, counseling pertinent to today's visit, and charting.  ?Jeremy [name] and I have discussed other options to help with depression.  Abilify would be helpful at a very low dose, even though he is already on an antipsychotic.  Explained that it is an adjunct treatment for severe depression.  He does not want to take another medication but will think about it. ?Tried to encourage him concerning the junk food.  Right now I am just happy that he is eating and has gained a little weight back.  He does need to have some protein and other fruits and vegetables.  Recommend he only eat half a pint of ice cream and then at mealtime eat a small portion of the real food. ?No changes will be made in treatment at his request. ? ?Continue Risperdal 3 mg, 1 p.o. nightly. ?Continue Ativan 0.5 mg, 1/2-1 p.o. twice daily as needed. ?Continue Cymbalta 60 mg, 2  p.o. every [name]. ?Continue mirtazapine 30 mg, 1 p.o. nightly. ?Continue therapy with Dr. [name].  ?Return in 4 weeks. ? ?Donnal Moat, PA-C    ?

## 2021-08-30 DIAGNOSIS — L821 Other seborrheic keratosis: Secondary | ICD-10-CM | POA: Diagnosis not present

## 2021-09-01 ENCOUNTER — Ambulatory Visit (INDEPENDENT_AMBULATORY_CARE_PROVIDER_SITE_OTHER): Payer: Medicare Other | Admitting: Psychiatry

## 2021-09-01 DIAGNOSIS — F332 Major depressive disorder, recurrent severe without psychotic features: Secondary | ICD-10-CM

## 2021-09-01 DIAGNOSIS — Z8659 Personal history of other mental and behavioral disorders: Secondary | ICD-10-CM | POA: Diagnosis not present

## 2021-09-01 DIAGNOSIS — Z9189 Other specified personal risk factors, not elsewhere classified: Secondary | ICD-10-CM | POA: Diagnosis not present

## 2021-09-01 DIAGNOSIS — F411 Generalized anxiety disorder: Secondary | ICD-10-CM | POA: Diagnosis not present

## 2021-09-01 DIAGNOSIS — R64 Cachexia: Secondary | ICD-10-CM | POA: Diagnosis not present

## 2021-09-01 DIAGNOSIS — F418 Other specified anxiety disorders: Secondary | ICD-10-CM | POA: Diagnosis not present

## 2021-09-01 NOTE — Patient Instructions (Signed)
Priority to resume vitamin supplements -- I believe your tremor and depression are both driven in part by deficiencies, and it's clear you have dropped items that were working.  Priority supplements for nerves and brain : ? - B complex ? - vitamin D ? - omega-3 (fish oil) ?Any delivery system is fine -- juice, like you used to, swallow straight with water, or swallow with a bite of ice cream, it only matters that get down your throat ? ?I am not worried at all about ice cream, just want to make sure you get enough protein to make brain chemistry and enough fat to help your brain cells hold together.  ? ?The bloated feeling in your stomach is not constipation -- it's mostly about the fact that your stomach has shrunk while you've been in famine.  New Year's ER and Miralax took care of constipation. ? ?The tremors and difficulty concentrating have to do with not making enough neurotransmitters.  Better protein, and better vitamins will help that. ? ?You're doing a better job of keeping up fluids, and your kidneys are probably doing better for it.  They probably also could use more fluid, so bring that up as much as you can. ? ?Exercise -- you may not feel up to exercise at all, but any stretching you can do still benefits in many ways.  Even chair stretches -- twists, reaches, shoulder rolls, neck bends, back bends -- will help overall sense of wellbeing and flexibility in your trunk and gut. ?

## 2021-09-01 NOTE — Progress Notes (Signed)
Psychotherapy Progress Note [facility] [name], PhD LP  Patient ID: Jeremy [name] [facility])    MRN: [id number] Therapy format: Individual psychotherapy Date: 09/01/2021      Start: 3:15p     Stop: 4:05p     Time Spent: 50 min Location: In-person   Session narrative (presenting needs, interim history, self-report of stressors and symptoms, applications of prior therapy, status changes, and interventions made in session) 105.0 lbs today.  Confesses he got down to 98 before starting to recover.  Wrestled at 14 in high school.  Ice cream and snack crackers have been staples, feels guilty about that.  Ice cream is soy-based, not sure what the fat content is in it.   Getting a little fruit.  Tends to have eaten ice cream by the time veggies are ready, so not getting much.  Would eat some chili or soup, but feels too bloated then.     Says [name] thinks we should be talking more about depression than his feeding issues (?).  Respectfully disagree that they are separate issues, and his depression -- while the start of his downturn last fall -- is certainly reinforced by disordered eating, and until recently, digestion.  Saw a dermatologist after some delay, and was afraid based on an old story that he would be sent to the ER, but just found out the spot on his forehead is benign.  Has had a couple of spots before that changed and (maybe) turned out to be cancerous.  Concerned with tremors now, too, feels they indicate he is falling apart.   Confesses he is off his whole regimen of supplements -- MVI, B complex, D, magnesium, omega 3, turmeric, etc.  Encouraged to resume, as most should be restorative and antiinflammatory.    Therapeutic modalities: Cognitive Behavioral Therapy and Solution-Oriented/Positive Psychology  Mental Status/Observations:  Appearance:   Neat     Behavior:  tentative  Motor:  Tense gait , mild tremor  Speech/Language:   Normal, husky tone  Affect:   Constricted  Mood:  anxious and depressed  Thought process:  normal  Thought content:    Ilusions  Sensory/Perceptual disturbances:    WNL  Orientation:  Fully oriented  Attention:  Good    Concentration:  Good  Memory:  grossly intact  Insight:    Fair  Judgment:   Fair  Impulse Control:  Good   Risk Assessment: Danger to Self: No Self-injurious Behavior: No Danger to Others: No Physical Aggression / Violence: No Duty to Warn: No Access to Firearms a concern: No  Assessment of progress:  progressing  Diagnosis:   ICD-10-CM   1. Severe episode of recurrent major depressive disorder, without psychotic features ([place])  F33.2     2. Anxiety with somatic features  F41.8     3. Generalized anxiety disorder  F41.1     4. Cachexia ([place])  R64     5. Potential for self care deficit  Z91.89     6. History of psychosis  Z86.59      Plan:  Renew vitamin supplements, as much and as soon as possible -- prioritize D, B complex, and omega 3 -- use any delivery system tolerated Maintain enriched diet to restore fat base and cortical nutrition Try to improve vegetable intake for balanced nutrition Try to increase up time and activity to promote muscle redevelopment Seek further socialization as tolerated Other recommendations/advice as may be noted above Continue to utilize previously learned skills ad lib  Maintain medication as prescribed and work faithfully with relevant prescriber(s) if any changes are desired or seem indicated Call the clinic on-call service, 988/hotline, 911, or present to [facility] or ER if any life-threatening psychiatric crisis Return in about 2 weeks (around 09/15/2021) for put on cancellation list through April. Already scheduled visit in this office 09/28/2021.  Blanchie Serve, PhD [name], PhD LP Clinical Psychologist, [facility] [address], [place], [place] [address] [phone]

## 2021-09-08 ENCOUNTER — Ambulatory Visit: Payer: Medicare Other | Admitting: Physician Assistant

## 2021-09-12 ENCOUNTER — Telehealth: Payer: Self-pay | Admitting: Physician Assistant

## 2021-09-12 NOTE — Telephone Encounter (Signed)
Called patient and asked if he was taking pills one at a time or multiple at once. He states he was taking one at a time. Asked if he was drinking plenty of water in between pills and he said he was. Suggested he try taking with applesauce or yogurt today and let us know if he had any problems.  ?

## 2021-09-12 NOTE — Telephone Encounter (Signed)
Jeremy [name] this morning at 9:00am to report that last night when he took his medication it got stuck in his esophagus and he couldn't get it out.  He had heart burn all night with burning sensation.  He is afraid to take his other medications this morning and later today.  Please call to discuss.  Next appt 6/8  ?

## 2021-09-21 ENCOUNTER — Ambulatory Visit (INDEPENDENT_AMBULATORY_CARE_PROVIDER_SITE_OTHER): Payer: Medicare Other | Admitting: Psychiatry

## 2021-09-21 DIAGNOSIS — F332 Major depressive disorder, recurrent severe without psychotic features: Secondary | ICD-10-CM | POA: Diagnosis not present

## 2021-09-21 DIAGNOSIS — F418 Other specified anxiety disorders: Secondary | ICD-10-CM

## 2021-09-21 DIAGNOSIS — Z9189 Other specified personal risk factors, not elsewhere classified: Secondary | ICD-10-CM | POA: Diagnosis not present

## 2021-09-21 DIAGNOSIS — Z8659 Personal history of other mental and behavioral disorders: Secondary | ICD-10-CM

## 2021-09-21 NOTE — Progress Notes (Signed)
Psychotherapy Progress Note [facility] [name], PhD LP  Patient ID: Jeremy [name] [facility])    MRN: [id number] Therapy format: Individual psychotherapy Date: 09/21/2021      Start: 2:30p     Stop: 3:20p     Time Spent: 50 min Location: In-person   Session narrative (presenting needs, interim history, self-report of stressors and symptoms, applications of prior therapy, status changes, and interventions made in session) Recent inquiry about medication getting caught in his throat may have been misunderstood.  Feels like his stomach is not really letting food in that well (esophageal stricture vs. somaticizing?).  Has added back supplements -- D, B, fish oil, plus MVI.  Hardest part was going out to buy them, able to swallow them OK.  Affirmed and encouraged.  The swallowing issue is particularly last thing at night, after having gorged on ice cream, crackers, but really, that will be as much as 6 hrs before bed, and feeling uncomfortably full the whole time afterward.  Less worry that he may be constipating, still interpreted as effect of shrunken stomach, though gastric motility can't be ruled out yet.  Noted that, with a decent swallow of water, he does get pills down pretty reliably.  If needed, reconsider barium and endoscopy, in c/o esophageal stricture (reluctant).  Discussed social contact.  Encouraged to allow, even solicit visits from friends again.    Physically, is doing 2-block walks and getting bits of time in the garden.  Encouraged any increments he can add to it.  Therapeutic modalities: Cognitive Behavioral Therapy and Solution-Oriented/Positive Psychology  Mental Status/Observations:  Appearance:   Neat     Behavior:  Appropriate and though tentative  Motor:  Normal and mild tremor  Speech/Language:   normal  Affect:  Appropriate, Constricted, and lightly responsive to humor  Mood:  anxious and depressed  Thought process:  normal  Thought content:     worry  Sensory/Perceptual disturbances:    WNL  Orientation:  Fully oriented  Attention:  Good    Concentration:  Good  Memory:  WNL  Insight:    Fair  Judgment:   Fair  Impulse Control:  Good   Risk Assessment: Danger to Self: No Self-injurious Behavior: No Danger to Others: No Physical Aggression / Violence: No Duty to Warn: No Access to Firearms a concern: No  Assessment of progress:  progressing  Diagnosis:   ICD-10-CM   1. Severe episode of recurrent major depressive disorder, without psychotic features ([place])  F33.2     2. Anxiety with somatic features  F41.8     3. Potential for self care deficit  Z91.89     4. History of psychosis  Z86.59      Plan:  Maintain nutrition and supplements Ensure adequate fluid, both for swallowing and function Try to improve vegetable intake for balanced nutrition Try to increase up time and activity to promote muscle redevelopment, build tolerance for walking at distance Seek further socialization as tolerated, extend Encourage re-engage GI if any concerns about stomach, digestion Other recommendations/advice as may be noted above Continue to utilize previously learned skills ad lib Maintain medication as prescribed and work faithfully with relevant prescriber(s) if any changes are desired or seem indicated Call the clinic on-call service, 988/hotline, 911, or present to [facility] or ER if any life-threatening psychiatric crisis Return in about 2 weeks (around 10/05/2021). Already scheduled visit in this office 09/28/2021.  Blanchie Serve, PhD [name], PhD LP Clinical Psychologist, [facility]  [facility] [address], [place], [place] [address] [phone]

## 2021-09-28 ENCOUNTER — Ambulatory Visit: Payer: Medicare Other | Admitting: Psychiatry

## 2021-09-29 NOTE — Telephone Encounter (Signed)
Error

## 2021-10-04 ENCOUNTER — Ambulatory Visit (INDEPENDENT_AMBULATORY_CARE_PROVIDER_SITE_OTHER): Payer: Medicare Other | Admitting: Physician Assistant

## 2021-10-04 ENCOUNTER — Encounter: Payer: Self-pay | Admitting: Physician Assistant

## 2021-10-04 VITALS — Wt 115.8 lb

## 2021-10-04 DIAGNOSIS — R5383 Other fatigue: Secondary | ICD-10-CM

## 2021-10-04 DIAGNOSIS — R5381 Other malaise: Secondary | ICD-10-CM | POA: Diagnosis not present

## 2021-10-04 DIAGNOSIS — F411 Generalized anxiety disorder: Secondary | ICD-10-CM

## 2021-10-04 DIAGNOSIS — F331 Major depressive disorder, recurrent, moderate: Secondary | ICD-10-CM | POA: Diagnosis not present

## 2021-10-04 MED ORDER — DULOXETINE HCL 60 MG PO CPEP: 120.0000 mg | ORAL_CAPSULE | Freq: Every day | ORAL | 1 refills | Status: DC

## 2021-10-04 NOTE — Progress Notes (Signed)
[facility] ? ?Patient ID: Jeremy [name],  ?MRN: [id number] ? ?PCP: Janie Morning, DO ? ?Date of Evaluation: 10/04/2021 ?Time spent:20 minutes ? ?Chief Complaint:  ?Chief Complaint   ?Anxiety; Depression; Follow-up ?  ? ? ?HISTORY/CURRENT STATUS: ?HPI for routine med check. ? ?Jeremy [name] states he is about the same as far as the depression goes.  He still does not want to do much of anything but then does tell me that he has been out in his garden and all the weeds have taken over.  He is working on 1 small section at a time to get rid of the weeds.  His flower garden was his pride and joy for 10 years and usually by this time of year he already has things in the ground but up until this visit he has not shown any interest in it. ? ?Does stay in the bedroom a lot.  Sleeps more than he should.  He continues to eat better but still eating ice cream or other junk food and has gained a little bit of weight.  He sometimes eats healthy food but not a lot out of it when he eats.  He finds himself going to the refrigerator and finding something that his wife has left, even ate a lot of pasta salad that was left over from an event she had and he does not even like pasta salad.  Not sure why he does that.  ADLs and personal hygiene are normal.  No suicidal or homicidal thoughts. ? ?Patient denies increased energy with decreased need for sleep, no increased talkativeness, no racing thoughts, no impulsivity or risky behaviors, no increased spending, no grandiosity, no increased irritability or anger, no paranoia, and no hallucinations. ? ?No complaints of anxiety like he had in the fall.  Has not taken Ativan in a while. ? ?Denies dizziness, syncope, seizures, numbness, tingling, tremor, tics, unsteady gait, slurred speech, confusion. Denies muscle or joint pain, stiffness, or dystonia. Denies unexplained weight loss, frequent infections, or sores that heal slowly.  No polyphagia, polydipsia, or polyuria. Denies visual  changes or paresthesias.  PCP follows his labs. ? ?Individual Medical History/ Review of Systems: Changes? :No     ? ?Past medications for mental health diagnoses include: ?(None listed in old chart) ?Lithium he took only for a few weeks for severe depression with suicidal thoughts but did not like it so he stopped it.  cymbalta, mirtazapine, Ativan, ? ?Was hospitalized many times since 1964, most recent was in 2005.  ? ?Allergies: Aspergillus allergy skin test, Aspirin, Sulfa antibiotics, and Theophyllines ? ?Current Medications:  ?Current Outpatient Medications:  ?  b complex vitamins capsule, Take 1 capsule by mouth daily., Disp: , Rfl:  ?  cholecalciferol (VITAMIN D3) 25 MCG (1000 UNIT) tablet, Take 1,000 Units by mouth daily., Disp: , Rfl:  ?  finasteride (PROSCAR) 5 MG tablet, Take 5 mg by mouth every morning., Disp: , Rfl:  ?  LORazepam (ATIVAN) 0.5 MG tablet, Take 1/2-1 tablet twice daily as needed for severe anxiety/panic, Disp: 60 tablet, Rfl: 1 ?  mirtazapine (REMERON) 30 MG tablet, Take 1 tablet (30 mg total) by mouth at bedtime., Disp: 90 tablet, Rfl: 1 ?  Omega-3 Fatty Acids (OMEGA 3 500 PO), Take by mouth., Disp: , Rfl:  ?  risperiDONE (RISPERDAL) 3 MG tablet, Take 1 tablet (3 mg total) by mouth at bedtime., Disp: 90 tablet, Rfl: 1 ?  tamsulosin (FLOMAX) 0.4 MG CAPS capsule, Take 0.4 mg by mouth every  evening., Disp: , Rfl:  ?  DULoxetine (CYMBALTA) 60 MG capsule, Take 2 capsules (120 mg total) by mouth daily., Disp: 180 capsule, Rfl: 1 ?  magnesium 30 MG tablet, Take 30 mg by mouth daily. (Patient not taking: Reported on 07/19/2021), Disp: , Rfl:  ?Medication Side Effects: none ? ?Family Medical/ Social History: Changes? No ? ?MENTAL HEALTH EXAM: ? ?Weight 115 lb 12.8 oz (52.5 kg).Body mass index is 19.27 kg/m?Marland Kitchen  Has gained about 12 pounds in the past 6 weeks.  ?General Appearance: Casual and Well Groomed  ?Eye Contact:  Good  ?Speech:  Clear and Coherent and Normal Rate  ?Volume:  Decreased  ?Mood:    Depressed but seems slightly improved  ?Affect:   Depressed but is more able to smile now.  ?Thought Process:  Goal Directed and Descriptions of Associations: Circumstantial  ?Orientation:  Full (Time, Place, and Person)  ?Thought Content: Logical   ?Suicidal Thoughts:  No  ?Homicidal Thoughts:  No  ?Memory:  WNL  ?Judgement:  Good  ?Insight:  Good  ?Psychomotor Activity:  Decreased  ?Concentration:  Concentration: Good and Attention Span: Good  ?Recall:  Good  ?Fund of Knowledge: Good  ?Language: Good  ?Assets:  Communication Skills ?Financial Resources/Insurance ?Housing ?Transportation  ?ADL's:  Intact  ?Cognition: WNL  ?Prognosis:  Poor  ? ? ?DIAGNOSES:  ?  ICD-10-CM   ?1. Major depressive disorder, recurrent episode, moderate ([facility])  F33.1   ?  ?2. Malaise and fatigue  R53.81   ? R53.83   ?  ?3. Generalized anxiety disorder  F41.1   ?  ? ? ?Receiving Psychotherapy: Yes Dr. Jonni Sanger Mitchum  ? ?RECOMMENDATIONS:  ?PDMP was reviewed. Ativan filled 04/26/2021. ?I provided 20 minutes of face to face time during this encounter, including time spent before and after the visit in records review, medical decision making, counseling pertinent to today's visit, and charting.  ?Ermias is showing slight improvement.  I am glad to hear him talking about his garden again.  He has gained around 12 pounds in the past 6 weeks so that is a good sign too.  I encouraged him to eat more fruits and vegetables and some protein it is okay to continue the sweets as well.  I just want him to maintain a healthy weight. ?He prefers making no change in medication.  That is fine as he does seem to be stable.  ? ?Continue Risperdal 3 mg, 1 p.o. nightly. ?Continue Ativan 0.5 mg, 1/2-1 p.o. twice daily as needed. ?Continue Cymbalta 60 mg, 2 p.o. every morning. ?Continue mirtazapine 30 mg, 1 p.o. nightly. ?Continue B complex, vitamin D, omega-3.  ?Continue therapy with Dr. [name].  ?Return in 6 weeks. ? ?Donnal Moat, PA-C    ?

## 2021-10-13 ENCOUNTER — Ambulatory Visit (INDEPENDENT_AMBULATORY_CARE_PROVIDER_SITE_OTHER): Payer: Medicare Other | Admitting: Psychiatry

## 2021-10-13 DIAGNOSIS — F418 Other specified anxiety disorders: Secondary | ICD-10-CM | POA: Diagnosis not present

## 2021-10-13 DIAGNOSIS — Z9189 Other specified personal risk factors, not elsewhere classified: Secondary | ICD-10-CM | POA: Diagnosis not present

## 2021-10-13 DIAGNOSIS — F332 Major depressive disorder, recurrent severe without psychotic features: Secondary | ICD-10-CM | POA: Diagnosis not present

## 2021-10-13 NOTE — Progress Notes (Signed)
Psychotherapy Progress Note [facility] [name], PhD LP  Patient ID: Jeremy [name] [facility])    MRN: [id number] Therapy format: Individual psychotherapy Date: 10/13/2021      Start: 3:20p     Stop: 4:10p     Time Spent: 50 min Location: In-person   Session narrative (presenting needs, interim history, self-report of stressors and symptoms, applications of prior therapy, status changes, and interventions made in session) 118# today, getting back in his usual clothes now, 19# above his lowest weight.  Started asking [name] to pick up salad, which worked for a bit, but he doesn't like the greens she's been getting after she stopped salad.  Says he's walked a couple days, maybe could walk to [employer] himself and shop for his preferred veg.  Not driving yet, since November.  Will think about it, does miss it, not yet trying it.  Feels he's more or less in his mother's shape now -- tremulous, nervous, falling apart.  Support/empathy provided.    Encouraged petition for what he wants in vegetables, including salad, as this will help with fiber, regularity, and some phytonutrients.  Also encouraged try to eat some of what [name] makes, since she is going to some effort for him.  For car and regaining driving, recommended he start by just sitting in the car some, feeling the controls without having to drive, as a way of warming up reflexes and senses.  Lukewarm, but willing.  Reveals issue about his pGM, who became similarly emaciated late in life, blind and shuffling, living with his [place].  Family stories of her going off to "work" in the hospital (euphemism for psychiatric stay).  Found it spooky seeing her, as a preteen, looking drawn and "witchy".  Strange as a [place] child to encounter the lack of indoor plumbing, share common bathing with cousins.  Had another cousin who was noticeably shaky and was institutionalized, too.  More recently, has thought of  himself as becoming his grandmother, which is depressing.  Support/empathy provided and reviewed both similarities and differences to break down the obsession.  Noted especially getting out for walks, trimming the garden path, and having been more knowledgeable and active than she.  As for self-care, is maintaining B complex, D, omega 3.  Uses smoothies and pea protein, allegedly with full complement of nutrients an amino acids.  (a concern last fall associated with pea protein preparation)  Therapeutic modalities: Cognitive Behavioral Therapy and Solution-Oriented/Positive Psychology  Mental Status/Observations:  Appearance:   Neat     Behavior:  Appropriate  Motor:  Normal and a little more fluent in movements  Speech/Language:   Clear and Coherent  Affect:  Appropriate, less constricted  Mood:  anxious and depressed  Thought process:  normal  Thought content:    Obsessions  Sensory/Perceptual disturbances:    WNL  Orientation:  Fully oriented  Attention:  Good    Concentration:  Good  Memory:  grossly intact  Insight:    Fair  Judgment:   Good  Impulse Control:  Good   Risk Assessment: Danger to Self: No Self-injurious Behavior: No Danger to Others: No Physical Aggression / Violence: No Duty to Warn: No Access to Firearms a concern: No  Assessment of progress:  progressing  Diagnosis:   ICD-10-CM   1. Severe episode of recurrent major depressive disorder, without psychotic features ([place])  F33.2     2. Anxiety with somatic features  F41.8  3. Potential for self care deficit  Z91.89      Plan:  Actively dispute the idea of turning into grandmother -- has help she did not, notable differences between them, and the thought itself is catastrophizing and depressively hypnotic Maintain nutrition and supplements Ensure adequate fluid, both for swallowing and function Try to improve vegetable intake for balanced nutrition, option to walk to market for his choice of veg Try  to increase up time and activity to promote muscle redevelopment, build tolerance for walking at distance Seek further socialization as tolerated, extend Encourage re-engage GI if any concerns about stomach, digestion Encourage just experiencing car time to refamiliarize his senses and controls Other recommendations/advice as may be noted above Continue to utilize previously learned skills ad lib Maintain medication as prescribed and work faithfully with relevant prescriber(s) if any changes are desired or seem indicated Call the clinic on-call service, 988/hotline, 911, or present to [facility] or ER if any life-threatening psychiatric crisis Return for session(s) already scheduled. Already scheduled visit in this office 11/13/2021.  Blanchie Serve, PhD [name], PhD LP Clinical Psychologist, [facility] [address] [place], [place] [address] [phone]

## 2021-11-08 DIAGNOSIS — H04123 Dry eye syndrome of bilateral lacrimal glands: Secondary | ICD-10-CM | POA: Diagnosis not present

## 2021-11-08 DIAGNOSIS — Z961 Presence of intraocular lens: Secondary | ICD-10-CM | POA: Diagnosis not present

## 2021-11-08 DIAGNOSIS — H52203 Unspecified astigmatism, bilateral: Secondary | ICD-10-CM | POA: Diagnosis not present

## 2021-11-13 ENCOUNTER — Ambulatory Visit (INDEPENDENT_AMBULATORY_CARE_PROVIDER_SITE_OTHER): Payer: Medicare Other | Admitting: Psychiatry

## 2021-11-13 DIAGNOSIS — Z9189 Other specified personal risk factors, not elsewhere classified: Secondary | ICD-10-CM

## 2021-11-13 DIAGNOSIS — Z8659 Personal history of other mental and behavioral disorders: Secondary | ICD-10-CM | POA: Diagnosis not present

## 2021-11-13 DIAGNOSIS — F418 Other specified anxiety disorders: Secondary | ICD-10-CM

## 2021-11-13 DIAGNOSIS — F332 Major depressive disorder, recurrent severe without psychotic features: Secondary | ICD-10-CM

## 2021-11-13 DIAGNOSIS — R64 Cachexia: Secondary | ICD-10-CM

## 2021-11-13 NOTE — Progress Notes (Signed)
Psychotherapy Progress Note [facility] Jeremy Moore, PhD LP  Patient ID: Jeremy [name] [facility])    MRN: [id number] Therapy format: Individual psychotherapy Date: 11/13/2021      Start: 1:14p     Stop: 2:00p     Time Spent: 46 min Location: In-person   Session narrative (presenting needs, interim history, self-report of stressors and symptoms, applications of prior therapy, status changes, and interventions made in session) Says he's been "crazy" lately, e.g., declining visit with sister who was passing through on grounds he didn't want to be the sick guy on display.  Son and daughter did stay briefly, on the way to the beach.  Acknowledges self-consciousness and feeling like he has to prevent people seeing a "[name] who isn't here any more".  Turned down a couple good friends, too.  Told D Ann he is "lacking character" for being in bed long hours and eating ice cream.  This is his 7th serious depressive episode now, and he doesn't see coming out of it.  Demurred letting a friend"Too Tall" come over to visit.  Ann got visibly upset with him on recent visit, cursing to him that life is hard and suggesting the need for him to buck up.  His son -- who wrote a book on faith and depression -- impressed on him that he's now 7 months out of the habit of visiting his granddaughter, and noted that he declined to come to a ceremony.  Reveals he is not looking at email like he used to, and allegedly his Inbox used to filter spam but now it all just cascades in.  Still staying in bed long hours.  Mentally painful to be up, physically painful to lie down.  Nutritionally, he has begun eating nuts again.  Old low back injury faring up again recently.  Validated feedback from family, challenged further to activity and out of bed behavior.  Reminded of health habits he had going very well just 8 months ago, recruiting him to remember how good those felt.  Firmly challenged the illusion of a "[name] who  doesn't exist any more" or is "lacking character".    Therapeutic modalities: Cognitive Behavioral Therapy, Solution-Oriented/Positive Psychology, and Motivational Interviewing  Mental Status/Observations:  Appearance:   Neat and Well Groomed     Behavior:  Appropriate and tentative  Motor:  Stiff gait  Speech/Language:   Typically more hoarse  Affect:  Constricted and Depressed  Mood:  depressed  Thought process:  normal  Thought content:    Ilusions and Rumination  Sensory/Perceptual disturbances:    Less somaticizing  Orientation:  grossly intact  Attention:  Good    Concentration:  Fair  Memory:  grossly intact  Insight:    Fair  Judgment:   Fair  Impulse Control:  Fair   Risk Assessment: Danger to Self: No Self-injurious Behavior: stable with wasting but still at risk Danger to Others: No Physical Aggression / Violence: No Duty to Warn: No Access to Firearms a concern: No  Assessment of progress:  stabilized  Diagnosis:   ICD-10-CM   1. Severe episode of recurrent major depressive disorder, without psychotic features ([place])  F33.2     2. Anxiety with somatic features  F41.8     3. Potential for self care deficit  Z91.89     4. History of psychosis  Z86.59     5. Cachexia ([place])  R64    stabilized      Plan:  Reconsider inviting friend "Too  Tall" over for a visit, acknowledging he is uncomfortable but wants to do it anyway Encourage taking [employer] email problem to an Apple store to resolve  Try to increase time out of bed, even if it is sitting in any other room besides the bedroom Try to increase physical movement -- walks, weeding, anything -- to improve mobility, resist wasting, get light exposure, and recapture the satisfaction of moving as he used to Further diversify nutrition, esp including more healthy fats, reduce ice cream, and more vegetable content any way possible (V8, smoothies) Other recommendations/advice as may be noted above Continue to utilize  previously learned skills ad lib Maintain medication as prescribed and work faithfully with relevant prescriber(s) if any changes are desired or seem indicated Call the clinic on-call service, 988/hotline, 911, or present to [facility] or ER if any life-threatening psychiatric crisis Return in about 2 weeks (around 11/27/2021). Already scheduled visit in this office 11/29/2021.  Blanchie Serve, PhD Jeremy Moore, PhD LP Clinical Psychologist, [facility] [address] [place], [place] [address] [phone]

## 2021-11-29 ENCOUNTER — Ambulatory Visit: Payer: Medicare Other | Admitting: Physician Assistant

## 2021-12-01 ENCOUNTER — Ambulatory Visit (INDEPENDENT_AMBULATORY_CARE_PROVIDER_SITE_OTHER): Payer: Medicare Other | Admitting: Psychiatry

## 2021-12-01 DIAGNOSIS — Z9189 Other specified personal risk factors, not elsewhere classified: Secondary | ICD-10-CM | POA: Diagnosis not present

## 2021-12-01 DIAGNOSIS — F332 Major depressive disorder, recurrent severe without psychotic features: Secondary | ICD-10-CM

## 2021-12-01 DIAGNOSIS — R64 Cachexia: Secondary | ICD-10-CM

## 2021-12-01 DIAGNOSIS — F418 Other specified anxiety disorders: Secondary | ICD-10-CM

## 2021-12-01 NOTE — Progress Notes (Signed)
Psychotherapy Progress Note [facility] Jeremy Moore, PhD LP  Patient ID: Jeremy [name] [facility])    MRN: [id number] Therapy format: Family therapy w/ patient -- accompanied by Jeremy [name] Date: 12/01/2021      Start: 11:20a     Stop: 12:10p     Time Spent: 50 min Location: In-person   Session narrative (presenting needs, interim history, self-report of stressors and symptoms, applications of prior therapy, status changes, and interventions made in session) Says both shoulders are so sore now he can only lie on his back, and still long hours.  Sporadically doing stretches in bed provided by chiropractor but really not diligent at all.  Knows his lying down is continuing to make things worse.  Actually awakened last night by radiating shoulder pain.  Proposes that Jeremy [name] might be enabling him, in that she will prepare food for him when he used to  Had one morning when he kept up all morning, went to a rocking chair, and did neck exercises.  Currently walking, with Jeremy [name], daily, started 2 blocks, lengthening toward a mile these days.  Est. 20 minutes worth, nearly every day, and doesn't feel particularly worn out.  Does feel worn by staying in bed.    Probed the issue of feeling abandoned/abandonable last fall, and validates that Jeremy [name] getting ready to embark on a 20-hour work week at [employer], for a mission he felt has become useless, was the big trigger for his depression in the fall, and that he did feel at risk for just not having her available and being too lonely.  That, and apparently it triggered this organic reaction.  Validated that she hear him out and adjusted her plans, not eliminating the work she wanted to do, but managing.  Encouraged him to thank her openly, something he realizes does not come naturally to him.  Also to thank her for the TLC involved in preparing food and bending her standards to rescue him from self-imposed starvation.  Now the challenge, then, is or  better out of bed behavior and restoring pleasant activities.  Notes the y have loved concerts and performances, e.g., [employer] concerts, EMF events, and in November came a shock point trying to go to a [employer].    Met individually with Jeremy [name] 15 min, who validates no constipation, no belly complaints, and achieved stable weight.  Walking is actually about 3 of 7 days.  Jeremy [name] is making the effort to start getting up every day and get into something, often the walk (which [name] be earlier with hot weather), but after the morning routine completes, he really feels stuck, useless, in pain with his stiff back and shoulders, and prone to lie down instead of create and initiate other activities.  Mentally sharp enough to be going over his bank statement, so no concern for cognition.  All seems to be about initiative and breaking the habit of retreating to bed.  Discussed goals and agreed she can coach them.  Highlighted the general idea of folding more of "the old normal" into daily living, with assurance he will come to feel more normal and more adult as he finds himself doing more normal and adult.  Coached in more flexible and therapeutically challenging handling of things like food prep (invite to get his hands involved) and events where he feels he can't tolerate the whole thing (precommit that if he needs to exit the performance space, he can wait in the  lobby or the car while she finished enjoying what she came for).  Agreed, and assured Jeremy [name] is willing in principle and can be guided through in practice.  Provided ideas how to respond if he balks, emphasizing "we agreed", "it's a commitment", "doctor said", and "it's temporary".  Therapeutic modalities: Cognitive Behavioral Therapy and Solution-Oriented/Positive Psychology  Mental Status/Observations:  Appearance:   Neat, Well Groomed, and worn      Behavior:  Appropriate  Motor:  Stiff gait  Speech/Language:   Clear  and Coherent and less husky  Affect:  Constricted, more responsive  Mood:  depressed  Thought process:  normal  Thought content:    Depressive ruminations  Sensory/Perceptual disturbances:    WNL  Orientation:  Fully oriented, soft recognition of the season  Attention:  Good    Concentration:  Good  Memory:  grossly intact  Insight:    Fair  Judgment:   Good  Impulse Control:  Fair   Risk Assessment: Danger to Self: No Self-injurious Behavior: No Danger to Others: No Physical Aggression / Violence: No Duty to Warn: No Access to Firearms a concern: No  Assessment of progress:  progressing  Diagnosis:   ICD-10-CM   1. Severe episode of recurrent major depressive disorder, without psychotic features ([place])  F33.2     2. Anxiety with somatic features  F41.8    improving, no mention of belly/digestive issues     3. Potential for self care deficit  Z91.89     4. Cachexia ([place])  R64    stabilized     Plan:  Keep up couple walking habit 20 min or more, most days Try to collaborate on food prep, don't just receive from [place] Try to thank Jeremy [name] for making the decision not to overcommit to church last fall Stretch out of bed behavior, rest elsewhere if resting Apply back and neck exercises liberally to break up sedentary orthopedic discomfort Collaborate on an outing, possibly EMF, with commitment to attend at least in part, and allow Jeremy [name] to remain for the whole thing if she pleases, with no shame/blame either way if he makes an early exit and she remains Keep enjoying diversity in nutrition, maintain adequate protein including all essential amino acids Other recommendations/advice as may be noted above Continue to utilize previously learned skills ad lib Maintain medication as prescribed and work faithfully with relevant prescriber(s) if any changes are desired or seem indicated Call the clinic on-call service, 988/hotline, 911, or present to [facility] or ER if any  life-threatening psychiatric crisis Return in about 2 weeks (around 12/15/2021) for recommend scheduling ahead. Already scheduled visit in this office 01/01/2022.  Blanchie Serve, PhD Jeremy Moore, PhD LP Clinical Psychologist, [facility] [address], [place], [place] [address] [phone]

## 2022-01-01 ENCOUNTER — Ambulatory Visit (INDEPENDENT_AMBULATORY_CARE_PROVIDER_SITE_OTHER): Payer: Medicare Other | Admitting: Psychiatry

## 2022-01-01 DIAGNOSIS — Z9189 Other specified personal risk factors, not elsewhere classified: Secondary | ICD-10-CM

## 2022-01-01 DIAGNOSIS — R64 Cachexia: Secondary | ICD-10-CM

## 2022-01-01 DIAGNOSIS — R69 Illness, unspecified: Secondary | ICD-10-CM | POA: Diagnosis not present

## 2022-01-01 DIAGNOSIS — Z8659 Personal history of other mental and behavioral disorders: Secondary | ICD-10-CM | POA: Diagnosis not present

## 2022-01-01 DIAGNOSIS — F332 Major depressive disorder, recurrent severe without psychotic features: Secondary | ICD-10-CM | POA: Diagnosis not present

## 2022-01-01 DIAGNOSIS — F418 Other specified anxiety disorders: Secondary | ICD-10-CM

## 2022-01-01 NOTE — Progress Notes (Signed)
Psychotherapy Progress Note [facility] [name], PhD LP  Patient ID: Jeremy [name] [facility])    MRN: [id number] Therapy format: Individual psychotherapy Date: 01/01/2022      Start: 3:20p     Stop: 4:05p     Time Spent: 45 min Location: In-person   Session narrative (presenting needs, interim history, self-report of stressors and symptoms, applications of prior therapy, status changes, and interventions made in session) "Just here."  Still spending a lot of time in bed.  Did make a project of vacuuming the house.  Continues to walk daily with [name], sometimes more at her urging.  Shoulder pain still, only sporadically stretching.  Sporadically hands [name] some of the ingredients for food prep, but feels like it's nothing to speak of, certainly not tangibly helping as perhaps he used to.  Says [name] had a 3-day stress episode mid-month.  Attributes to midsummer lull in all her usual outlets not being available.  Strongly challenged to improve out of bed behavior, don't let the mood/thought patterns he has lull him into hardening up in place, further staying in bed.  Been toying with the possibility of returning to chiropractor, since he has a chronic bulging disc, and now shoulder and lumbar pain, but reluctant b/c the man was working on retirement, and b/c knows he's "used to" seeing [place] as "the track man", an shining example of being well-trained for senior games.  Challenged about letting embarrassment make his decisions for him, urged a healthy rebellion against those thoughts, letting himself limp where needed but activate.  Therapeutic modalities: Cognitive Behavioral Therapy, Solution-Oriented/Positive Psychology, and Ego-Supportive  Mental Status/Observations:  Appearance:   Casual and Neat     Behavior:  Rigid  Motor:  Some rigidity  Speech/Language:   Clear and Coherent, husky voice  Affect:  Constricted  Mood:  depressed  Thought process:  normal  Thought  content:    Rumination  Sensory/Perceptual disturbances:    Somatically focused  Orientation:  grossly intact  Attention:  grossly intact    Concentration:  grossly intact  Memory:  grossly intact  Insight:    grossly intact  Judgment:   Fair  Impulse Control:  Good   Risk Assessment: Danger to Self: No Self-injurious Behavior: No Danger to Others: No Physical Aggression / Violence: No Duty to Warn: No Access to Firearms a concern: No  Assessment of progress:  stabilized  Diagnosis:   ICD-10-CM   1. Severe episode of recurrent major depressive disorder, without psychotic features ([place])  F33.2     2. Anxiety with somatic features  F41.8     3. Potential for self care deficit  Z91.89     4. Cachexia ([place])  R64     5. History of psychosis  Z86.59     6. r/o ARCD  R69      Plan:  Challenge to improve out of bed behavior -- lounge elsewhere if lounging, and get good light early Keep up couple walking habit 20 min or more, most days Try to collaborate more on food prep and other housework, don't let it just settle into [place] doing for him, participate Try to thank [name] for making the decision not to overcommit to church last fall Apply back and neck exercises liberally to break up sedentary orthopedic pain Collaborate on an outing, possibly EMF, with commitment to attend at least in part, and allow [name] to remain for the whole thing if she pleases, with no shame/blame either way if  he makes an early exit and she remains Keep improving diversity in nutrition, maintain adequate protein including all essential amino acids, and start driving down ice cream Other recommendations/advice as may be noted above Continue to utilize previously learned skills ad lib Maintain medication as prescribed and work faithfully with relevant prescriber(s) if any changes are desired or seem indicated Call the clinic on-call service, 988/hotline, 911, or present to [facility] or ER if any  life-threatening psychiatric crisis Return in about 2 weeks (around 01/15/2022) for put on cancellation list. Already scheduled visit in this office 01/30/2022.  Blanchie Serve, PhD [name], PhD LP Clinical Psychologist, [facility] [address], [place], [place] [address] [phone]

## 2022-01-02 ENCOUNTER — Other Ambulatory Visit: Payer: Self-pay | Admitting: Physician Assistant

## 2022-01-25 DIAGNOSIS — R972 Elevated prostate specific antigen [PSA]: Secondary | ICD-10-CM | POA: Diagnosis not present

## 2022-01-25 DIAGNOSIS — N401 Enlarged prostate with lower urinary tract symptoms: Secondary | ICD-10-CM | POA: Diagnosis not present

## 2022-01-25 DIAGNOSIS — R3912 Poor urinary stream: Secondary | ICD-10-CM | POA: Diagnosis not present

## 2022-01-29 ENCOUNTER — Ambulatory Visit: Payer: Medicare Other | Admitting: Psychiatry

## 2022-01-30 ENCOUNTER — Ambulatory Visit (INDEPENDENT_AMBULATORY_CARE_PROVIDER_SITE_OTHER): Payer: Medicare Other | Admitting: Psychiatry

## 2022-01-30 DIAGNOSIS — R69 Illness, unspecified: Secondary | ICD-10-CM

## 2022-01-30 DIAGNOSIS — F332 Major depressive disorder, recurrent severe without psychotic features: Secondary | ICD-10-CM

## 2022-01-30 DIAGNOSIS — Z8659 Personal history of other mental and behavioral disorders: Secondary | ICD-10-CM | POA: Diagnosis not present

## 2022-01-30 DIAGNOSIS — Z9189 Other specified personal risk factors, not elsewhere classified: Secondary | ICD-10-CM | POA: Diagnosis not present

## 2022-01-30 DIAGNOSIS — R64 Cachexia: Secondary | ICD-10-CM

## 2022-01-30 NOTE — Progress Notes (Signed)
Psychotherapy Progress Note [facility] Jeremy Moore, PhD LP  Patient ID: Jeremy [name] [facility])    MRN: [id number] Therapy format: Individual psychotherapy Date: 01/30/2022      Start: 2:12p     Stop: 3:00p     Time Spent: 48 min Location: In-person   Session narrative (presenting needs, interim history, self-report of stressors and symptoms, applications of prior therapy, status changes, and interventions made in session) Been feeling "super-dark", worthless, and intimidated doing today.  Bargained for a milkshake with Jeremy [name] if he has to go out today.  Thinks he looks pathetic, is pathetic.  Says Jeremy [name] is "starting to say enough, which is strong for her".  4 out of the last 5 days has begged off getting up, walking.  Says he will respond if Jeremy [name] gets firm.  Passed up two gatherings of friends.  Challenged about allowing himself to flounder, letting feelings dictate what he believes possible, the same lie of depression he's seen through and worked through before.  Challenged about seeing his prospects through the lens of dark mood, and how it's neither fair to himself nor his family to side with depression the way he has been.  It is high time to push back, and Jeremy [name] is too nice or timid to say it.  It will mean being uncomfortable, but he's worth it.  Reveals he did take the challenge to call his chiropractor instead of making assumptions, thought about it weekly, eventually did it yesterday, and found out the man is still working 1 day/wk, now scheduled for 9/11.  Affirmed and encouraged.  Further challenge to [facility] it's OK to be firmer, challenge to use the second chance the afternoon is and walk today.  Had thought of getting a dog.  Brainstormed possible ways to get animal comfort without imposing on Jeremy [name] to be the designated care/clean.  Challenged to get back in touch with deferred friend to visit and green-light him to come back.  Therapeutic modalities: Cognitive  Behavioral Therapy and Solution-Oriented/Positive Psychology  Mental Status/Observations:  Appearance:   Casual, Neat, and unsteady/shaken      Behavior:  Resistant  Motor:  Shuffling Gait  Speech/Language:   Slow and hoarse  Affect:  Constricted and Depressed  Mood:  anxious and depressed  Thought process:  normal  Thought content:    Rumination and hopelessness  Sensory/Perceptual disturbances:    WNL  Orientation:  Fully oriented  Attention:  Fair    Concentration:  Fair  Memory:  WNL  Insight:    Fair  Judgment:   Fair  Impulse Control:  Fair   Risk Assessment: Danger to Self: No Self-injurious Behavior: No Danger to Others: No Physical Aggression / Violence: No Duty to Warn: No Access to Firearms a concern: No  Assessment of progress:  stabilized  Diagnosis:   ICD-10-CM   1. Severe episode of recurrent major depressive disorder, without psychotic features ([place])  F33.2     2. Potential for self care deficit  Z91.89     3. Cachexia ([place])  R64     4. History of psychosis  Z86.59     5. r/o ARCD  R69      Plan:  Walk this afternoon, weather permitting Follow thorough with chiropractor Authority to [place] to be more firm Tell Jeremy [name] yes to come over and visit; Jeremy [name] may do so if [employer] walking habit Option to go to pet store and interact with dogs at no obligation  Continue "KP" --  helping in kitchen Strong recommendation to relocate from the bedroom by midmorning Other recommendations/advice as may be noted above Continue to utilize previously learned skills ad lib Maintain medication as prescribed and work faithfully with relevant prescriber(s) if any changes are desired or seem indicated Call the clinic on-call service, 988/hotline, 911, or present to [facility] or ER if any life-threatening psychiatric crisis Return in about 2 weeks (around 02/13/2022). Already scheduled visit in this office 02/28/2022.  Blanchie Serve, PhD Jeremy Moore, PhD  LP Clinical Psychologist, [facility] [address] [place], [place] [address] [phone]

## 2022-01-30 NOTE — Patient Instructions (Addendum)
Walk this afternoon, weather permitting Follow thorough with chiropractor Authority to [name] to be more firm Tell [name] yes to come over and visit; [name] may do so if [employer] walking habit Option to go to pet store and interact with dogs at no obligation  Continue "KP" -- helping in kitchen Strong recommendation to relocate from the bedroom by midmorning

## 2022-01-31 DIAGNOSIS — L821 Other seborrheic keratosis: Secondary | ICD-10-CM | POA: Diagnosis not present

## 2022-02-27 NOTE — Progress Notes (Incomplete)
Psychotherapy Progress Note [facility] [name], PhD LP  Patient ID: Jeremy [name] [facility])    MRN: [id number] Therapy format: Individual psychotherapy Date: 01/30/2022      Start: 2:12p     Stop: 3:00p     Time Spent: 48 min Location: In-person   Session narrative (presenting needs, interim history, self-report of stressors and symptoms, applications of prior therapy, status changes, and interventions made in session) Been feeling "super-dark", worthless, and intimidated doing today.  Bargained for a milkshake with Jeremy [name] if he has to go out today.  Thinks he looks pathetic, is pathetic.  Says Jeremy [name] is "starting to say enough, which is strong for her".  4 out of the last 5 days has begged off getting up, walking.  Says he will respond if Jeremy [name] gets firm.  Passed up two gatherings of friends.  Challenged about allowing himself to flounder, letting feelings dictate what he believes possible, the same lie of depression he's seen through and worked through before.  Challenged about seeing his prospects through the lens of dark mood, and how it's neither fair to himself nor his family to side with depression the way he has been.  It is high time to push back, and Jeremy [name] is too nice or timid to say it.  It will mean being uncomfortable, but he's worth it.  Reveals he did take the challenge to call his chiropractor instead of making assumptions, thought about it weekly, eventually did it yesterday, and found out the man is still working 1 day/wk, now scheduled for 9/11.  Affirmed and encouraged.  Further challenge to Jeremy [name] it'  Therapeutic modalities: {AM:23362::"Cognitive Behavioral Therapy","Solution-Oriented/Positive Psychology"}  Mental Status/Observations:  Appearance:   {PSY:22683}     Behavior:  {PSY:21022743}  Motor:  {PSY:22302}  Speech/Language:   {PSY:22685}  Affect:  {PSY:22687}  Mood:  {PSY:31886}  Thought process:  {PSY:31888}  Thought content:     {PSY:5101675703}  Sensory/Perceptual disturbances:    {PSY:801-845-8633}  Orientation:  {Psych Orientation:23301::"Fully oriented"}  Attention:  {Good-Fair-Poor ratings:23770::"Good"}    Concentration:  {Good-Fair-Poor ratings:23770::"Good"}  Memory:  {PSY:703-847-1939}  Insight:    {Good-Fair-Poor ratings:23770::"Good"}  Judgment:   {Good-Fair-Poor ratings:23770::"Good"}  Impulse Control:  {Good-Fair-Poor ratings:23770::"Good"}   Risk Assessment: Danger to Self: {Risk:22599::"No"} Self-injurious Behavior: {Risk:22599::"No"} Danger to Others: {Risk:22599::"No"} Physical Aggression / Violence: {Risk:22599::"No"} Duty to Warn: {AMYesNo:22526::"No"} Access to Firearms a concern: {AMYesNo:22526::"No"}  Assessment of progress:  {Progress:22147::"progressing"}  Diagnosis: No diagnosis found. Plan:  Walk this afternoon, weather permitting Follow thorough with chiropractor Authority to Jeremy [name] to be more firm Tell Jeremy [name] yes to come over and visit; Jeremy [name] may do so if [employer] walking habit Option to go to pet store and interact with dogs at no obligation  Continue "KP" -- helping in kitchen Strong recommendation to relocate from the bedroom by midmorning Other recommendations/advice as may be noted above Continue to utilize previously learned skills ad lib Maintain medication as prescribed and work faithfully with relevant prescriber(s) if any changes are desired or seem indicated Call the clinic on-call service, 988/hotline, 911, or present to [facility] or ER if any life-threatening psychiatric crisis No follow-ups on file. Already scheduled visit in this office 02/28/2022.  Jeremy Serve, PhD [name], PhD LP Clinical Psychologist, [facility] [address] [place], [place] [address] [phone]

## 2022-02-28 ENCOUNTER — Ambulatory Visit (INDEPENDENT_AMBULATORY_CARE_PROVIDER_SITE_OTHER): Payer: Medicare Other | Admitting: Psychiatry

## 2022-02-28 DIAGNOSIS — F418 Other specified anxiety disorders: Secondary | ICD-10-CM

## 2022-02-28 DIAGNOSIS — F332 Major depressive disorder, recurrent severe without psychotic features: Secondary | ICD-10-CM | POA: Diagnosis not present

## 2022-02-28 DIAGNOSIS — R64 Cachexia: Secondary | ICD-10-CM | POA: Diagnosis not present

## 2022-02-28 DIAGNOSIS — Z8659 Personal history of other mental and behavioral disorders: Secondary | ICD-10-CM

## 2022-02-28 DIAGNOSIS — Z9189 Other specified personal risk factors, not elsewhere classified: Secondary | ICD-10-CM | POA: Diagnosis not present

## 2022-02-28 NOTE — Progress Notes (Unsigned)
Psychotherapy Progress Note [facility] [name], PhD LP  Patient ID: Auden Tatar [facility])    MRN: [id number] Therapy format: Individual psychotherapy Date: 02/28/2022      Start: 2:22p     Stop: 3:08p     Time Spent: 46 min Location: In-person   Session narrative (presenting needs, interim history, self-report of stressors and symptoms, applications of prior therapy, status changes, and interventions made in session) Finds he's developing arthritis in shoulders, neck, down arms, attributes to long hours in bed.  Hands are less flexible with arthritis.  Was balking at coming out today, hard to motivate to take a shower.  Did get out for something of a walk this morning, hoping to fight back a little on arthritis trying to get established.  Affirmed and encouraged.  Says he did invite [name] over, but "it was a disaster" -- Jenny Reichmann and [name] spoke for an hour and [name] left.  Has not reinvited him.  Cancelled chiropractor 9/11 as well.  Firmly confronted cancelling out as a form of protecting his depression, along with still going to bed long hours.  Denies wish to die, more feels like it's his fate.  Strongly encouraged to get chiropractic reinstated, either regular, retiring or go on and start with new, but get his body into the shop to loosen it up.  Discussed chiropractic advice and need to tone down inflammation.  Agreed to resume turmeric, omega 3 (already in possession).  Strongly urged to walk again regularly, either alone, with [name], or with Jenny Reichmann.  OK to Just walk with Jenny Reichmann, no obligation to hold up a conversation if not inclined.  Trust Jenny Reichmann is friend enough to understand and go with it, but he will need the invitation, not settle for deferring to [facility] wishes.    Same for wife, who may need to be tougher still about things like getting his help in the kitchen, or cleaning.  Consulted with her at session's end, encouraging green light to bring friends and family in [place] to  be firmer still about walking connecting, and out of bed behavior.  Therapeutic modalities: Cognitive Behavioral Therapy, Solution-Oriented/Positive Psychology, and directive  Mental Status/Observations:  Appearance:   Casual and Neat     Behavior:  Rigid, Rationalizing, and fatalistic  Motor:  Tense gait , tremor  Speech/Language:   Clear and Coherent and husky  Affect:  Constricted  Mood:  depressed  Thought process:  ruminative  Thought content:    Rumination  Sensory/Perceptual disturbances:    Somatically focused, stiffening up  Orientation:  Fully oriented  Attention:  Good    Concentration:  Good  Memory:  grossly intact  Insight:    Fair  Judgment:   Fair  Impulse Control:  Fair   Risk Assessment: Danger to Self: No Self-injurious Behavior: No Danger to Others: No Physical Aggression / Violence: No Duty to Warn: No Access to Firearms a concern: No  Assessment of progress:  deteriorating -- in need of reassessment  Diagnosis:   ICD-10-CM   1. Severe episode of recurrent major depressive disorder, without psychotic features ([place])  F33.2     2. Potential for self care deficit  Z91.89     3. Cachexia ([place])  R64     4. History of psychosis  Z86.59     5. Anxiety with somatic features  F41.8      Plan:  Today call [name], reinvite over.  Can walk, talk at interest, but try again Re-engage chiropractor  of choice, retiring or new.  Failing that, physical therapy. Resume antioxidant/antiinflammatory supplements Redouble efforts to lounge out of bed, stretch Other recommendations/advice as may be noted above Continue to utilize previously learned skills ad lib Maintain medication as prescribed and work faithfully with relevant prescriber(s) if any changes are desired or seem indicated Call the clinic on-call service, 988/hotline, 911, or present to [facility] or ER if any life-threatening psychiatric crisis Return for time as available, recommend scheduling ahead, prefer  more regular than q 1 mo. Already scheduled visit in this office 03/06/2022.  Blanchie Serve, PhD [name], PhD LP Clinical Psychologist, [facility] [address] [place], [place] [address] [phone]

## 2022-03-05 ENCOUNTER — Ambulatory Visit: Payer: Self-pay | Admitting: Podiatry

## 2022-03-06 ENCOUNTER — Encounter: Payer: Self-pay | Admitting: Physician Assistant

## 2022-03-06 ENCOUNTER — Ambulatory Visit (INDEPENDENT_AMBULATORY_CARE_PROVIDER_SITE_OTHER): Payer: Medicare Other | Admitting: Physician Assistant

## 2022-03-06 VITALS — Wt 118.0 lb

## 2022-03-06 DIAGNOSIS — F411 Generalized anxiety disorder: Secondary | ICD-10-CM

## 2022-03-06 DIAGNOSIS — R5383 Other fatigue: Secondary | ICD-10-CM | POA: Diagnosis not present

## 2022-03-06 DIAGNOSIS — G4700 Insomnia, unspecified: Secondary | ICD-10-CM | POA: Diagnosis not present

## 2022-03-06 DIAGNOSIS — R5381 Other malaise: Secondary | ICD-10-CM | POA: Diagnosis not present

## 2022-03-06 DIAGNOSIS — F329 Major depressive disorder, single episode, unspecified: Secondary | ICD-10-CM

## 2022-03-06 MED ORDER — DULOXETINE HCL 60 MG PO CPEP: 120.0000 mg | ORAL_CAPSULE | Freq: Every day | ORAL | 1 refills | Status: DC

## 2022-03-06 MED ORDER — ARIPIPRAZOLE 2 MG PO TABS: 2.0000 mg | ORAL_TABLET | Freq: Every day | ORAL | 1 refills | Status: DC

## 2022-03-06 MED ORDER — RISPERIDONE 3 MG PO TABS: 3.0000 mg | ORAL_TABLET | Freq: Every day | ORAL | 1 refills | Status: DC

## 2022-03-06 MED ORDER — MIRTAZAPINE 30 MG PO TABS: 30.0000 mg | ORAL_TABLET | Freq: Every day | ORAL | 1 refills | Status: DC

## 2022-03-06 NOTE — Progress Notes (Signed)
[facility]  Patient ID: Jeremy [name],  MRN: [id number]  PCP: Janie Morning, DO  Date of Evaluation: 03/06/2022 Time spent:30 minutes  Chief Complaint:  Chief Complaint   Anxiety; Depression; Insomnia; Follow-up    HISTORY/CURRENT STATUS: HPI overdue for medication check  Still depressed, having low energy, stays in bed all the time, he states that has caused arthritis.  He is having a lot of pain in his upper arms, shoulders bilaterally knees, hands.  States it all over his body.  That in itself is depressing too.  Does not enjoy anything.  States he is "pathetic."  Has no energy or motivation.  Appetite is low but he has been eating and has gained some weight since the last time I saw him.  ADLs and personal hygiene are normal.  He does isolate.  Has anxiety, feels uneasy at times, which is often.  He is comfortable at home so the anxiety is not as bad when he is in his comfort zone.  Not having panic attacks.  Occasionally he will take an Ativan.  No change in memory.  Sleeps okay, takes mirtazapine every night.  Denies suicidal or homicidal thoughts.  Patient denies increased energy with decreased need for sleep, increased talkativeness, racing thoughts, impulsivity or risky behaviors, increased spending, increased libido, grandiosity, increased irritability or anger, paranoia, or hallucinations.  Denies dizziness, syncope, seizures, numbness, tingling, tremor, tics, unsteady gait, slurred speech, confusion. Denies muscle or joint pain, stiffness, or dystonia. Denies unexplained weight loss, frequent infections, or sores that heal slowly.  No polyphagia, polydipsia, or polyuria. Denies visual changes or paresthesias.  PCP follows his labs.  Individual Medical History/ Review of Systems: Changes? :No      Past medications for mental health diagnoses include: (None listed in old chart) Lithium he took only for a few weeks for severe depression with suicidal thoughts but did  not like it so he stopped it.  cymbalta, mirtazapine, Ativan,  Was hospitalized many times since 1964, most recent was in 2005.   Allergies: Aspergillus allergy skin test, Aspirin, Sulfa antibiotics, and Theophyllines  Current Medications:  Current Outpatient Medications:    ARIPiprazole (ABILIFY) 2 MG tablet, Take 1 tablet (2 mg total) by mouth daily., Disp: 30 tablet, Rfl: 1   finasteride (PROSCAR) 5 MG tablet, Take 5 mg by mouth every morning., Disp: , Rfl:    LORazepam (ATIVAN) 0.5 MG tablet, Take 1/2-1 tablet twice daily as needed for severe anxiety/panic, Disp: 60 tablet, Rfl: 1   Omega-3 Fatty Acids (OMEGA 3 500 PO), Take by mouth., Disp: , Rfl:    tamsulosin (FLOMAX) 0.4 MG CAPS capsule, Take 0.4 mg by mouth every evening., Disp: , Rfl:    b complex vitamins capsule, Take 1 capsule by mouth daily. (Patient not taking: Reported on 03/06/2022), Disp: , Rfl:    cholecalciferol (VITAMIN D3) 25 MCG (1000 UNIT) tablet, Take 1,000 Units by mouth daily. (Patient not taking: Reported on 03/06/2022), Disp: , Rfl:    DULoxetine (CYMBALTA) 60 MG capsule, Take 2 capsules (120 mg total) by mouth daily., Disp: 180 capsule, Rfl: 1   magnesium 30 MG tablet, Take 30 mg by mouth daily. (Patient not taking: Reported on 07/19/2021), Disp: , Rfl:    mirtazapine (REMERON) 30 MG tablet, Take 1 tablet (30 mg total) by mouth at bedtime., Disp: 90 tablet, Rfl: 1   risperiDONE (RISPERDAL) 3 MG tablet, Take 1 tablet (3 mg total) by mouth at bedtime., Disp: 90 tablet, Rfl: 1 Medication Side  Effects: none  Family Medical/ Social History: Changes? No  MENTAL HEALTH EXAM:  Weight 118 lb (53.5 kg).Body mass index is 19.64 kg/m.    General Appearance: Casual and Well Groomed  Eye Contact:  Good  Speech:  Clear and Coherent and Normal Rate  Volume:  Decreased  Mood:  Depressed  Affect:  Congruent  Thought Process:  Goal Directed and Descriptions of Associations: Circumstantial  Orientation:  Full (Time, Place,  and Person)  Thought Content: Logical   Suicidal Thoughts:  No  Homicidal Thoughts:  No  Memory:  WNL  Judgement:  Good  Insight:  Good  Psychomotor Activity:  Decreased  Concentration:  Concentration: Good and Attention Span: Good  Recall:  Good  Fund of Knowledge: Good  Language: Good  Assets:  Industrial/product designer  ADL's:  Intact  Cognition: WNL  Prognosis:  Poor   DIAGNOSES:    ICD-10-CM   1. Treatment-resistant depression  F32.9     2. Generalized anxiety disorder  F41.1     3. Malaise and fatigue  R53.81    R53.83     4. Insomnia, unspecified type  G47.00      Receiving Psychotherapy: Yes Dr. Jonni Sanger Mitchum   RECOMMENDATIONS:  PDMP was reviewed. Ativan filled 04/26/2021. I provided 30 minutes of face to face time during this encounter, including time spent before and after the visit in records review, medical decision making, counseling pertinent to today's visit, and charting.   We had a long discussion about his symptoms.  I still recommend adding Abilify, which we have discussed in the past, as an adjuvant treatment.  He does not want to change meds but will think about it.  I have sent the prescription in, explained benefits, risks and side effects and he accepts.  This can help with energy and motivation.  I let him know that if it works really well he may be able to get off risperidone.  Another option could be Wellbutrin but that can decrease appetite and we cannot afford for him to lose any more weight, he is now stable for the past 5 months or so.   For now will leave all other meds the same.  Start Abilify 2 mg, 1 p.o. every morning. Continue Cymbalta 60 mg, 2 p.o. every morning. Continue Ativan 0.5 mg, 1/2-1 p.o. twice daily as needed anxiety. Continue mirtazapine 30 mg, 1 p.o. nightly. Continue risperidone 3 mg, 1 p.o. nightly. Strongly recommend he get back on vitamins listed on the med  sheet. Continue therapy with Dr. [name].  Return in 6 weeks.  (If he chooses not to start the Abilify he can return in 3 to 4 months.)  Donnal Moat, PA-C

## 2022-03-06 NOTE — Progress Notes (Incomplete)
Psychotherapy Progress Note [facility] [name], PhD LP  Patient ID: Jeremy [name] [facility])    MRN: [id number] Therapy format: Individual psychotherapy Date: 02/28/2022      Start: 2:22p     Stop: ***:***     Time Spent: *** min Location: In-person   Session narrative (presenting needs, interim history, self-report of stressors and symptoms, applications of prior therapy, status changes, and interventions made in session) Finds he's developing arthritis in shoulders, neck, down arms, attributes to long hours in bed.  Hands are less flexible with arthriits.  Was balking at coming out today, hard to motivate to take a shower.  Did get out for something of a walk this morning, hoping to fight back a little on arthritis trying to get established.  Affirmed and encouraged.  Says he did invite [name] over, but "it was a disaster" -- Jenny Reichmann and [name] spoke for an hour and [name] left.  Has not reinvited him.  Cancelled chiropractor 9/11 as well.  Firmly confronted cancelling out as a form of protecting his depression, along with still going to bed long hours.  Denies wish to die, more feels like it's his fate.  Strongly encouraged to get chiropractic reinstated, either regular, retiring or go on and start with new, but get his body into the shop to loosen it up.  Discussed chiropractic advice and need to tone down inflammation.  Agreed to resume turmeric, omega 3 (already in possession).  Strongly urged to walk again regularly, either alone, with [name], or with Jenny Reichmann.  OK to Just walk with Jenny Reichmann, no obligation to hold up a conversation if not inclined.  Trust Jenny Reichmann is friend enough to understand and go with it, but he will need the invitation, not settle for deferring to [facility] wishes.    Same for wife, who may need to be tougher still about things like getting his help in the kitchen, or cleaning.  Consulted with her at session's end.  Therapeutic modalities: {AM:23362::"Cognitive  Behavioral Therapy","Solution-Oriented/Positive Psychology"}  Mental Status/Observations:  Appearance:   {PSY:22683}     Behavior:  {PSY:21022743}  Motor:  {PSY:22302}  Speech/Language:   {PSY:22685}  Affect:  {PSY:22687}  Mood:  {PSY:31886}  Thought process:  {PSY:31888}  Thought content:    {PSY:630-682-3788}  Sensory/Perceptual disturbances:    {PSY:678-356-3935}  Orientation:  {Psych Orientation:23301::"Fully oriented"}  Attention:  {Good-Fair-Poor ratings:23770::"Good"}    Concentration:  {Good-Fair-Poor ratings:23770::"Good"}  Memory:  {PSY:972-787-2771}  Insight:    {Good-Fair-Poor ratings:23770::"Good"}  Judgment:   {Good-Fair-Poor ratings:23770::"Good"}  Impulse Control:  {Good-Fair-Poor ratings:23770::"Good"}   Risk Assessment: Danger to Self: {Risk:22599::"No"} Self-injurious Behavior: {Risk:22599::"No"} Danger to Others: {Risk:22599::"No"} Physical Aggression / Violence: {Risk:22599::"No"} Duty to Warn: {AMYesNo:22526::"No"} Access to Firearms a concern: {AMYesNo:22526::"No"}  Assessment of progress:  {Progress:22147::"progressing"}  Diagnosis: No diagnosis found. Plan:  Today call [name], reinvite over.  Can walk, talk at interest, but try again Re-engage chiropractor of choice, retiring or new Resume antioxidant/antiinflammatory supplements  Other recommendations/advice as may be noted above Continue to utilize previously learned skills ad lib Maintain medication as prescribed and work faithfully with relevant prescriber(s) if any changes are desired or seem indicated Call the clinic on-call service, 988/hotline, 911, or present to [facility] or ER if any life-threatening psychiatric crisis No follow-ups on file. Already scheduled visit in this office 03/06/2022.  Blanchie Serve, PhD [name], PhD LP Clinical Psychologist, [facility] [address], [place], [place] [address] [phone]

## 2022-03-23 ENCOUNTER — Ambulatory Visit (INDEPENDENT_AMBULATORY_CARE_PROVIDER_SITE_OTHER): Payer: Medicare Other | Admitting: Podiatry

## 2022-03-23 DIAGNOSIS — B353 Tinea pedis: Secondary | ICD-10-CM | POA: Diagnosis not present

## 2022-03-23 MED ORDER — CLOTRIMAZOLE-BETAMETHASONE 1-0.05 % EX CREA: 1.0000 | TOPICAL_CREAM | Freq: Two times a day (BID) | CUTANEOUS | 0 refills | Status: DC

## 2022-03-23 NOTE — Progress Notes (Signed)
Subjective:  Patient ID: Jeremy [name], male    DOB: 1936-03-10,  MRN: [id number]  Chief Complaint  Patient presents with   Nail Problem    Nail fungus     86 y.o. male presents with the above complaint.  Patient presents with bilateral athlete's foot.  Patient states been present for quite some time is progressive gotten worse worse with ambulation worse with pressure 1 to get it evaluated.  He has tried some over-the-counter options none of which has helped.  He would like to like prescription options.  He does not want to take anything orally.   Review of Systems: Negative except as noted in the HPI. Denies N/V/F/Ch.  Past Medical History:  Diagnosis Date   Aortic atherosclerosis (HCC)    BPH (benign prostatic hyperplasia)    Bronchiectasis (HCC)    Depression    Depression    GAD (generalized anxiety disorder)     Current Outpatient Medications:    clotrimazole-betamethasone (LOTRISONE) cream, Apply 1 Application topically 2 (two) times daily., Disp: 30 g, Rfl: 0   ARIPiprazole (ABILIFY) 2 MG tablet, Take 1 tablet (2 mg total) by mouth daily., Disp: 30 tablet, Rfl: 1   b complex vitamins capsule, Take 1 capsule by mouth daily. (Patient not taking: Reported on 03/06/2022), Disp: , Rfl:    cholecalciferol (VITAMIN D3) 25 MCG (1000 UNIT) tablet, Take 1,000 Units by mouth daily. (Patient not taking: Reported on 03/06/2022), Disp: , Rfl:    DULoxetine (CYMBALTA) 60 MG capsule, Take 2 capsules (120 mg total) by mouth daily., Disp: 180 capsule, Rfl: 1   finasteride (PROSCAR) 5 MG tablet, Take 5 mg by mouth every morning., Disp: , Rfl:    LORazepam (ATIVAN) 0.5 MG tablet, Take 1/2-1 tablet twice daily as needed for severe anxiety/panic, Disp: 60 tablet, Rfl: 1   magnesium 30 MG tablet, Take 30 mg by mouth daily. (Patient not taking: Reported on 07/19/2021), Disp: , Rfl:    mirtazapine (REMERON) 30 MG tablet, Take 1 tablet (30 mg total) by mouth at bedtime., Disp: 90 tablet, Rfl: 1   Omega-3  Fatty Acids (OMEGA 3 500 PO), Take by mouth., Disp: , Rfl:    risperiDONE (RISPERDAL) 3 MG tablet, Take 1 tablet (3 mg total) by mouth at bedtime., Disp: 90 tablet, Rfl: 1   tamsulosin (FLOMAX) 0.4 MG CAPS capsule, Take 0.4 mg by mouth every evening., Disp: , Rfl:   Social History   Tobacco Use  Smoking Status Never  Smokeless Tobacco Never    Allergies  Allergen Reactions   Aspergillus Allergy Skin Test Cough   Aspirin     Irritates stomach   Sulfa Antibiotics Other (See Comments)    Childhood    Theophyllines Other (See Comments)    Does not remember   Objective:  There were no vitals filed for this visit. There is no height or weight on file to calculate BMI. Constitutional Well developed. Well nourished.  Vascular Dorsalis pedis pulses palpable bilaterally. Posterior tibial pulses palpable bilaterally. Capillary refill normal to all digits.  No cyanosis or clubbing noted. Pedal hair growth normal.  Neurologic Normal speech. Oriented to person, place, and time. Epicritic sensation to light touch grossly present bilaterally.  Dermatologic Bilateral skin epidermal lysis noted with subjective complaint of itching consistent with athlete's foot.Patient is noted no xerosis noted  Orthopedic: Normal joint ROM without pain or crepitus bilaterally. No visible deformities. No bony tenderness.   Radiographs: None Assessment:   1. Tinea pedis of both feet  Plan:  Patient was evaluated and treated and all questions answered.  Bilateral athlete's foot -All questions and concerns were discussed with the patient extensive detail.  Given the amount of athlete's foot that present he will benefit from Lotrisone cream.  Lotrisone cream was sent to the pharmacy.  Advised him apply twice a day he states understanding.  No follow-ups on file.

## 2022-04-01 ENCOUNTER — Other Ambulatory Visit: Payer: Self-pay | Admitting: Physician Assistant

## 2022-04-03 ENCOUNTER — Ambulatory Visit (INDEPENDENT_AMBULATORY_CARE_PROVIDER_SITE_OTHER): Payer: Self-pay | Admitting: Psychiatry

## 2022-04-03 DIAGNOSIS — Z91199 Patient's noncompliance with other medical treatment and regimen due to unspecified reason: Secondary | ICD-10-CM

## 2022-04-03 NOTE — Progress Notes (Signed)
Admin note for non-service contact  Patient ID: Jeremy [name]  MRN: [id number] DATE: 04/03/2022  Per staff, PT called just before appt time to say he can't make it, just not feeling well enough today.  Based on prior assessment, it is depressive resistance and further sign that he needs more assertive treatment.  Currently scheduled for return visits for med management 11/14 and therapy 11/30.  Wife is aware of options at last interaction, and aware that treatment team stands behind involving family and friends more assertively, and family are aware of availability and procedures should hospitalization be indicated.  Jeremy Serve, PhD Jeremy Moore, PhD LP Clinical Psychologist, [facility] [address] [place], [place] [address] [phone]

## 2022-04-03 NOTE — Progress Notes (Deleted)
Psychotherapy Progress Note [facility] [name], PhD LP  Patient ID: Jeremy [name] [facility])    MRN: [id number] Therapy format: {Therapy Types:21967::"Individual psychotherapy"} Date: 04/03/2022      Start: ***:***     Stop: ***:***     Time Spent: *** min Location: {SvcLoc:22530::"In-person"}   Session narrative (presenting needs, interim history, self-report of stressors and symptoms, applications of prior therapy, status changes, and interventions made in session) ***  Therapeutic modalities: {AM:23362::"Cognitive Behavioral Therapy","Solution-Oriented/Positive Psychology"}  Mental Status/Observations:  Appearance:   {PSY:22683}     Behavior:  {PSY:21022743}  Motor:  {PSY:22302}  Speech/Language:   {PSY:22685}  Affect:  {PSY:22687}  Mood:  {PSY:31886}  Thought process:  {PSY:31888}  Thought content:    {[phone]}  Sensory/Perceptual disturbances:    {PSY:307-307-7791}  Orientation:  {Psych Orientation:23301::"Fully oriented"}  Attention:  {Good-Fair-Poor ratings:23770::"Good"}    Concentration:  {Good-Fair-Poor ratings:23770::"Good"}  Memory:  {PSY:203-609-2304}  Insight:    {Good-Fair-Poor ratings:23770::"Good"}  Judgment:   {Good-Fair-Poor ratings:23770::"Good"}  Impulse Control:  {Good-Fair-Poor ratings:23770::"Good"}   Risk Assessment: Danger to Self: {Risk:22599::"No"} Self-injurious Behavior: {Risk:22599::"No"} Danger to Others: {Risk:22599::"No"} Physical Aggression / Violence: {Risk:22599::"No"} Duty to Warn: {AMYesNo:22526::"No"} Access to Firearms a concern: {AMYesNo:22526::"No"}  Assessment of progress:  {Progress:22147::"progressing"}  Diagnosis: No diagnosis found. Plan:  *** Other recommendations/advice as may be noted above Continue to utilize previously learned skills ad lib Maintain medication as prescribed and work faithfully with relevant prescriber(s) if any changes are desired or seem indicated Call the clinic on-call  service, 988/hotline, 911, or present to [facility] or ER if any life-threatening psychiatric crisis No follow-ups on file. Already scheduled visit in this office 04/17/2022.  Blanchie Serve, PhD [name], PhD LP Clinical Psychologist, [facility] [address], [place], [place] [address] [phone]

## 2022-04-17 ENCOUNTER — Ambulatory Visit: Payer: Medicare Other | Admitting: Physician Assistant

## 2022-05-03 ENCOUNTER — Ambulatory Visit (INDEPENDENT_AMBULATORY_CARE_PROVIDER_SITE_OTHER): Payer: Medicare Other | Admitting: Psychiatry

## 2022-05-03 DIAGNOSIS — R5383 Other fatigue: Secondary | ICD-10-CM

## 2022-05-03 DIAGNOSIS — Z9189 Other specified personal risk factors, not elsewhere classified: Secondary | ICD-10-CM | POA: Diagnosis not present

## 2022-05-03 DIAGNOSIS — R64 Cachexia: Secondary | ICD-10-CM

## 2022-05-03 DIAGNOSIS — F418 Other specified anxiety disorders: Secondary | ICD-10-CM | POA: Diagnosis not present

## 2022-05-03 DIAGNOSIS — R5381 Other malaise: Secondary | ICD-10-CM

## 2022-05-03 DIAGNOSIS — F329 Major depressive disorder, single episode, unspecified: Secondary | ICD-10-CM

## 2022-05-03 DIAGNOSIS — Z8659 Personal history of other mental and behavioral disorders: Secondary | ICD-10-CM

## 2022-05-03 NOTE — Progress Notes (Signed)
Psychotherapy Progress Note [facility] [name], PhD LP  Patient ID: Jeremy [name] [facility])    MRN: [id number] Therapy format: Individual psychotherapy Date: 05/03/2022      Start: 2:11p     Stop: 3:10p     Time Spent: 59 min Location: In-person   Session narrative (presenting needs, interim history, self-report of stressors and symptoms, applications of prior therapy, status changes, and interventions made in session) First time back in 2 months.  Troubled especially last couple days by somatic problems including locked finger joints on right hand and foot pain.  Still spending long times in bed, now habit after about a year.  No dr visits in recent time except podiatrist, only to address athlete's foot, which he reports has spread thigh high.  Limited itching.  Directed back to primary care for sound advice.  Does get turmeric and omega 3.  Recent decision to dump the ice cream habit, a few days now.  Otherwise, he says he still eats oatmeal, eggs, dairy and nondairy smoothies with seeds and wheat germ, but not sure what quantities or how regularly, and no vegetables whatsoever.  Says he is able to do ADLs, but motivation to get them started can be hard, and now his hands are malfunctioning, needs [name] to help with balance and doing buttons getting dressed.  Still on a Miralax "routine", taking it every couple days to prevent constipation, which does come back if he stops.  Admits he frequently has dark urine, and he gets away with many hours in bed, suggesting ongoing dehydration.  Last couple days more anxious, finding it harder to stay abed, took Ativan last night for feeling sick to his stomach.    Given these things and his more pale, sunken appearance, interpreted ongoing dehydration, apparently worsening, and the need to step up fluids.  Reinforced with wife post-session.  Discussed more grave concern about inactivity and staying in bed, and being allowed to do so,  apparently without substantial challenge  Re. family, did not see the kids last week in person, had family video instead.  [name] left him overnight early in the month to visit family for birthday, and he got very nervous.    Therapeutic modalities: Cognitive Behavioral Therapy, Solution-Oriented/Positive Psychology, and Motivational Interviewing  Mental Status/Observations:  Appearance:   Neat but very pale, sunken      Behavior:  Appropriate  Motor:  Shuffling Gait and spastic right hand, general stiffness  Speech/Language:   Hoarse, fluent  Affect:  Constricted  Mood:  depressed  Thought process:  normal  Thought content:    Rumination  Sensory/Perceptual disturbances:    Preoccupation with somatic concerns  Orientation:  grossly intact  Attention:  grossly intact    Concentration:  Fair  Memory:  grossly intact  Insight:    Fair  Judgment:   Fair  Impulse Control:  Poor   Risk Assessment: Danger to Self: No Self-injurious Behavior:  persistent self-neglect Danger to Others: No Physical Aggression / Violence: No Duty to Warn: No Access to Firearms a concern: No  Assessment of progress:  deteriorating -- in need of reassessment  Diagnosis:   ICD-10-CM   1. Treatment-resistant depression  F32.9     2. Malaise and fatigue  R53.81    R53.83     3. History of psychosis  Z86.59     4. Cachexia ([place])  R64     5. Anxiety with somatic features  F41.8     6. Potential  for self care deficit  Z91.89      Plan:  See PCP for widespread fungal infection, suspected vitamin deficiencies, and assessment of hand spasticity Add greens to smoothies -- e.g., frozen spinach Improve hydration -- try 32 oz or larger bottle, with straw, for visible  Ask [name] to get more involved in monitoring and prompting hydration Consider referral to psychiatric hospital or rehab to address intractable self-care deficits and wasting Other recommendations/advice as may be noted above Continue to  utilize previously learned skills ad lib Maintain medication as prescribed and work faithfully with relevant prescriber(s) if any changes are desired or seem indicated Call the clinic on-call service, 988/hotline, 911, or present to [facility] or ER if any life-threatening psychiatric crisis Return within 1 mo. Already scheduled visit in this office 05/15/2022.  Blanchie Serve, PhD [name], PhD LP Clinical Psychologist, [facility] [address] [place], [place] [address] [phone]

## 2022-05-15 ENCOUNTER — Ambulatory Visit (INDEPENDENT_AMBULATORY_CARE_PROVIDER_SITE_OTHER): Payer: Medicare Other | Admitting: Physician Assistant

## 2022-05-15 ENCOUNTER — Encounter: Payer: Self-pay | Admitting: Physician Assistant

## 2022-05-15 VITALS — Wt 118.8 lb

## 2022-05-15 DIAGNOSIS — F411 Generalized anxiety disorder: Secondary | ICD-10-CM

## 2022-05-15 DIAGNOSIS — F329 Major depressive disorder, single episode, unspecified: Secondary | ICD-10-CM | POA: Diagnosis not present

## 2022-05-15 DIAGNOSIS — R5381 Other malaise: Secondary | ICD-10-CM | POA: Diagnosis not present

## 2022-05-15 DIAGNOSIS — R5383 Other fatigue: Secondary | ICD-10-CM

## 2022-05-15 DIAGNOSIS — G4700 Insomnia, unspecified: Secondary | ICD-10-CM | POA: Diagnosis not present

## 2022-05-15 NOTE — Progress Notes (Signed)
[facility]  Patient ID: Selassie Spatafore,  MRN: [id number]  PCP: Janie Morning, DO  Date of Evaluation: 05/15/2022 Time spent:20 minutes  Chief Complaint:  Chief Complaint   Depression; Anxiety; Follow-up    HISTORY/CURRENT STATUS: HPI  For routine med check  Is losing mobility in his hands. Can't grasp things. Also has right shoulder pain and soreness when he touches it, and feel like his arm is weak. Thinks he has arthritis.   Still staying in bed the majority of the time. Doesn't drive, wife has been driving for over a year now. Doesn't want to make any med changes.  States it took a long time to figure out what works.  Has been more depressed for a couple of years though.  He is eating more now and weight is stable.  Energy is low, motivation is nonexistent.  Personal hygiene is normal.  ADLs, does what he has to do but that is all.  Not crying easily.  States his emotions are "blah."  Takes the Ativan occasionally.  It is effective when needed.  Sleeps well.  No suicidal or homicidal thoughts.  Patient denies increased energy with decreased need for sleep, increased talkativeness, racing thoughts, impulsivity or risky behaviors, increased spending, increased libido, grandiosity, increased irritability or anger, paranoia, or hallucinations.  Denies dizziness, syncope, seizures, numbness, tingling, tremor, tics, unsteady gait, slurred speech, confusion. Denies muscle or joint pain, stiffness, or dystonia. Denies unexplained weight loss, frequent infections, or sores that heal slowly.  No polyphagia, polydipsia, or polyuria. Denies visual changes or paresthesias.  PCP follows his labs.  Individual Medical History/ Review of Systems: Changes? :Yes    see HPI  Past medications for mental health diagnoses include: (None listed in old chart) Lithium he took only for a few weeks for severe depression with suicidal thoughts but did not like it so he stopped it.  cymbalta,  mirtazapine, Ativan,   Was hospitalized many times since 1964, most recent was in 2005.   Allergies: Aspergillus allergy skin test, Aspirin, Sulfa antibiotics, and Theophyllines  Current Medications:  Current Outpatient Medications:    clotrimazole-betamethasone (LOTRISONE) cream, Apply 1 Application topically 2 (two) times daily., Disp: 30 g, Rfl: 0   DULoxetine (CYMBALTA) 60 MG capsule, Take 2 capsules (120 mg total) by mouth daily., Disp: 180 capsule, Rfl: 1   finasteride (PROSCAR) 5 MG tablet, Take 5 mg by mouth every morning., Disp: , Rfl:    LORazepam (ATIVAN) 0.5 MG tablet, Take 1/2-1 tablet twice daily as needed for severe anxiety/panic, Disp: 60 tablet, Rfl: 1   mirtazapine (REMERON) 30 MG tablet, Take 1 tablet (30 mg total) by mouth at bedtime., Disp: 90 tablet, Rfl: 1   Omega-3 Fatty Acids (OMEGA 3 500 PO), Take by mouth., Disp: , Rfl:    risperiDONE (RISPERDAL) 3 MG tablet, Take 1 tablet (3 mg total) by mouth at bedtime., Disp: 90 tablet, Rfl: 1   tamsulosin (FLOMAX) 0.4 MG CAPS capsule, Take 0.4 mg by mouth every evening., Disp: , Rfl:    Turmeric (QC TUMERIC COMPLEX PO), Take by mouth., Disp: , Rfl:    ARIPiprazole (ABILIFY) 2 MG tablet, TAKE 1 TABLET BY MOUTH EVERY DAY (Patient not taking: Reported on 05/15/2022), Disp: 90 tablet, Rfl: 0   b complex vitamins capsule, Take 1 capsule by mouth daily. (Patient not taking: Reported on 03/06/2022), Disp: , Rfl:    cholecalciferol (VITAMIN D3) 25 MCG (1000 UNIT) tablet, Take 1,000 Units by mouth daily. (Patient not taking: Reported  on 03/06/2022), Disp: , Rfl:    magnesium 30 MG tablet, Take 30 mg by mouth daily. (Patient not taking: Reported on 07/19/2021), Disp: , Rfl:  Medication Side Effects: none  Family Medical/ Social History: Changes? No  MENTAL HEALTH EXAM:  Weight 118 lb 12.8 oz (53.9 kg).Body mass index is 19.77 kg/m.    General Appearance: Casual, Well Groomed, and decreased mobility of first second and third finger on  the right hand, without grip at all, left hand shows approximately 45 degree flexion of first, second, third, and fourth digits.  Severe arthritic changes in the MCP, DIP, and all digits.  Eye Contact:  Good  Speech:  Clear and Coherent and Normal Rate  Volume:  Decreased  Mood:   sad  Affect:  Congruent  Thought Process:  Goal Directed and Descriptions of Associations: Circumstantial  Orientation:  Full (Time, Place, and Person)  Thought Content: Logical   Suicidal Thoughts:  No  Homicidal Thoughts:  No  Memory:  WNL  Judgement:  Good  Insight:  Good  Psychomotor Activity:   Mild fine motor tremor of hands bilaterally.  Concentration:  Concentration: Good and Attention Span: Good  Recall:  Good  Fund of Knowledge: Good  Language: Good  Assets:  Industrial/product designer  ADL's:  Intact  Cognition: WNL  Prognosis:  Poor   DIAGNOSES:    ICD-10-CM   1. Treatment-resistant depression  F32.9     2. Malaise and fatigue  R53.81    R53.83     3. Generalized anxiety disorder  F41.1     4. Insomnia, unspecified type  G47.00       Receiving Psychotherapy: Yes Dr. Jonni Sanger Mitchum   RECOMMENDATIONS:  PDMP was reviewed. Ativan filled 04/26/2021. I provided 20 minutes of face to face time during this encounter, including time spent before and after the visit in records review, medical decision making, counseling pertinent to today's visit, and charting.   Irie "reluctantly agrees" to try the Abilify.  I am aware that he is on another antipsychotic already but he is most willing to add Abilify versus adding Wellbutrin, that may give him more energy and motivation.  In all honesty, I will be surprised if he tries Abilify.  We have had numerous examples of this due to his reluctance of making any medication changes, even though the current regimen is no longer effective. Also recommend restarting B complex, vitamin D, multivitamin.  Start  Abilify 2 mg, 1 p.o. every morning. Continue Cymbalta 60 mg, 2 p.o. every morning. Continue Ativan 0.5 mg, 1/2-1 p.o. twice daily as needed anxiety. Continue mirtazapine 30 mg, 1 p.o. nightly. Continue risperidone 3 mg, 1 p.o. nightly. Continue therapy with Dr. [name].  Return in 4 weeks.  Donnal Moat, PA-C

## 2022-05-21 ENCOUNTER — Telehealth: Payer: Self-pay | Admitting: Podiatry

## 2022-05-21 NOTE — Telephone Encounter (Signed)
Pt called. Wants to know if there is a pill version of medication prescribed below:  clotrimazole-betamethasone (LOTRISONE) cream   [id number] [place], [place] - [address], [place] [id number]  Please advise

## 2022-05-21 NOTE — Telephone Encounter (Signed)
Patient has been updated thru voice message

## 2022-05-29 ENCOUNTER — Other Ambulatory Visit: Payer: Self-pay | Admitting: Podiatry

## 2022-06-08 DIAGNOSIS — E538 Deficiency of other specified B group vitamins: Secondary | ICD-10-CM | POA: Diagnosis not present

## 2022-06-08 DIAGNOSIS — M8580 Other specified disorders of bone density and structure, unspecified site: Secondary | ICD-10-CM | POA: Diagnosis not present

## 2022-06-08 DIAGNOSIS — E559 Vitamin D deficiency, unspecified: Secondary | ICD-10-CM | POA: Diagnosis not present

## 2022-06-08 DIAGNOSIS — R972 Elevated prostate specific antigen [PSA]: Secondary | ICD-10-CM | POA: Diagnosis not present

## 2022-06-08 DIAGNOSIS — N400 Enlarged prostate without lower urinary tract symptoms: Secondary | ICD-10-CM | POA: Diagnosis not present

## 2022-06-08 DIAGNOSIS — R7309 Other abnormal glucose: Secondary | ICD-10-CM | POA: Diagnosis not present

## 2022-06-08 DIAGNOSIS — Z5181 Encounter for therapeutic drug level monitoring: Secondary | ICD-10-CM | POA: Diagnosis not present

## 2022-06-08 DIAGNOSIS — D649 Anemia, unspecified: Secondary | ICD-10-CM | POA: Diagnosis not present

## 2022-06-08 DIAGNOSIS — R946 Abnormal results of thyroid function studies: Secondary | ICD-10-CM | POA: Diagnosis not present

## 2022-06-12 ENCOUNTER — Ambulatory Visit: Payer: Medicare Other | Admitting: Podiatry

## 2022-06-14 ENCOUNTER — Ambulatory Visit (INDEPENDENT_AMBULATORY_CARE_PROVIDER_SITE_OTHER): Payer: Medicare Other | Admitting: Psychiatry

## 2022-06-14 ENCOUNTER — Ambulatory Visit: Payer: Medicare Other | Admitting: Podiatry

## 2022-06-14 DIAGNOSIS — R5383 Other fatigue: Secondary | ICD-10-CM

## 2022-06-14 DIAGNOSIS — K5900 Constipation, unspecified: Secondary | ICD-10-CM | POA: Diagnosis not present

## 2022-06-14 DIAGNOSIS — F418 Other specified anxiety disorders: Secondary | ICD-10-CM | POA: Diagnosis not present

## 2022-06-14 DIAGNOSIS — Z9189 Other specified personal risk factors, not elsewhere classified: Secondary | ICD-10-CM | POA: Diagnosis not present

## 2022-06-14 DIAGNOSIS — Z8659 Personal history of other mental and behavioral disorders: Secondary | ICD-10-CM

## 2022-06-14 DIAGNOSIS — F322 Major depressive disorder, single episode, severe without psychotic features: Secondary | ICD-10-CM | POA: Diagnosis not present

## 2022-06-14 DIAGNOSIS — R5381 Other malaise: Secondary | ICD-10-CM

## 2022-06-14 DIAGNOSIS — R634 Abnormal weight loss: Secondary | ICD-10-CM | POA: Diagnosis not present

## 2022-06-14 NOTE — Progress Notes (Signed)
Psychotherapy Progress Note [facility] Jeremy Moore, PhD LP  Patient ID: Jeremy [name] [facility])    MRN: [id number] Therapy format: Individual psychotherapy Date: 06/14/2022      Start: 1:10p     Stop: 2:00p     Time Spent: 50 min Location: In-person   Session narrative (presenting needs, interim history, self-report of stressors and symptoms, applications of prior therapy, status changes, and interventions made in session) 6 wks since last seen.  Med check a month ago found continuing absent motivation, sufficient ADLs but still wasting, time in bed, and now losing mobility in his hands.  Reluctantly agreed to Abilify, and allegedly accepted restarting broad vitamin supplementation.  In person today, says it's just dawning on him how much he's let himself go.  Compliant with Abilify, quick to note "the deal" was to try it a month, and the month is almost up.  Appetite up further,, with though no apparent weight gain.  Appt tomorrow with PCP Jeremy [name] to go over annual physical blood work + vitamin assay.    Reviewed nutrition in some detail.  Says Jeremy [name] has been making lots of greens since last visit, he has been eating them, and they taste good to him, reflected back as good signs for overcoming malnutrition and restoring some amount of normal brain function.  Current weight 118 lbs, 22 below ideal but 19 above the worst a year ago.  C/o constipation again, using Miralax on a PRN basis.  Not taking it daily until the last few days, now considering 2 doses/day.  Advocated do that until bowels respond, subject to medical advice and continuing kale and collards, incorporating some meat if possible.  Had a bit at Christmas -- lamb, chicken.  Still uses a pea protein supplement that supposedly has all necessary amino acids, but only 1 out of 3 days at the most.  Will have 2 eggs a couple days, just oatmeal a couple days, so probably still inadequate protein but better.  Has not picked  up MVI, B complex, and D.  Says he is on a "painkiller" (identified as curcumin/turmeric) and omega 3 (fish oil, uncertain what dose), clarified that these are anti-inflammatory supplements, which will tend to have multiple benefits.  Meanwhile, back pain is getting more acute, with some stabbing pain, most likely from tightened muscles.  Suspecting gout now, as well, with aching feet and nighttime acute big-toe pain.  And believes the issue with his hands is probably gout, too.  Pledges to address those with Jeremy [name] tomorrow, too.    Admittedly still trending dehydrated.  Says his foot fungus is spreading higher, too (though it may be dermatosis neglecta as well?)  Says he is unable to apply the podiatrist's cream regularly or competently and says he doesn't get enough to do the job anyway, as the podiatrist stops responsibility at his feet.  Probed his approach both to applying the cream and advocating for his needs, unsure either one is sufficient, so consented to let me inform Jeremy [name] of the need.  Meanwhile, not sure he has enough hand mobility to apply himself but would rather do it himself than ask Jeremy [name].  Does require Jeremy [name]'s help for nail trims, shoe tying, and some aspects of dressing, prompting her to tell him if he lets his condition go too much, he will require home health or assisted living.  He is clear that she would draw aline at help toileting or diaper changes.  Used the issue to encourage  him to get up, get mobile, fight back against his lounging habit, and at least stretch to encourage his joints and muscles to better condition.  May already require PT, for all we can tell.  Therapeutic modalities: Cognitive Behavioral Therapy, Solution-Oriented/Positive Psychology, Ego-Supportive, and Psycho-education/Bibliotherapy  Mental Status/Observations:  Appearance:   Casual and Neat, pale and drawn  Behavior:  Appropriate and less resistant  Motor:  Tight movements, some tremor   Speech/Language:   Clear, husky  Affect:  Flat  Mood:  depressed  Thought process:  concrete  Thought content:    Rumination  Sensory/Perceptual disturbances:    Pain as noted  Orientation:  grossly intact  Attention:  grossly intact    Concentration:  grossly intact  Memory:  grossly intact  Insight:    Fair  Judgment:   Fair  Impulse Control:  Fair   Risk Assessment: Danger to Self: No Self-injurious Behavior: No Danger to Others: No Physical Aggression / Violence: No Duty to Warn: No Access to Firearms a concern: No  Assessment of progress:  stabilized, very gradual improvement  Diagnosis:   ICD-10-CM   1. Severe depression ([place])  F32.2     2. Anxiety with somatic features  F41.8     3. Malaise and fatigue  R53.81    R53.83     4. Potential for self care deficit  Z91.89     5. History of psychosis  Z86.59     6. Loss of weight  R63.4    improving    7. Constipation, unspecified constipation type  K59.00      Plan:  Priority recommendations: Restart B complex Find more protein OK to increase Miralax as authorized by physician Message podiatry and PCP about antifungal treatment complications and recommendations Jeremy [name] agrees to handle) Consider trigger conditions for home health and PT, possibly home-based See PCP and podiatrist jointly to reconcile treatment strategy for widespread fungal infection, PCP tomorrow for suspected vitamin deficiencies, assessment of hand spasticity and foot pain Continue to add greens, proteins, and variety to diet Further improve hydration -- try 32 oz or larger bottle, with straw, for visible.  Ask Jeremy [name] to get more involved in monitoring and prompting hydration, as PT's estimation may be unreliable. Consider referral to psychiatric hospital or rehab to address intractable self-care deficits and wasting Other recommendations/advice as may be noted above Continue to utilize previously learned skills ad lib Maintain medication  as prescribed and work faithfully with relevant prescriber(s) if any changes are desired or seem indicated Call the clinic on-call service, 988/hotline, 911, or present to [facility] or ER if any life-threatening psychiatric crisis Return for as already scheduled, recommend regular service to promote recovery. Already scheduled visit in this office 06/21/2022.  Blanchie Serve, PhD Jeremy Moore, PhD LP Clinical Psychologist, [facility] [address] [place], [place] [address] [phone]

## 2022-06-15 DIAGNOSIS — M549 Dorsalgia, unspecified: Secondary | ICD-10-CM | POA: Diagnosis not present

## 2022-06-15 DIAGNOSIS — M2550 Pain in unspecified joint: Secondary | ICD-10-CM | POA: Diagnosis not present

## 2022-06-15 DIAGNOSIS — M79642 Pain in left hand: Secondary | ICD-10-CM | POA: Diagnosis not present

## 2022-06-15 DIAGNOSIS — Z0000 Encounter for general adult medical examination without abnormal findings: Secondary | ICD-10-CM | POA: Diagnosis not present

## 2022-06-15 DIAGNOSIS — F329 Major depressive disorder, single episode, unspecified: Secondary | ICD-10-CM | POA: Diagnosis not present

## 2022-06-15 DIAGNOSIS — M79641 Pain in right hand: Secondary | ICD-10-CM | POA: Diagnosis not present

## 2022-06-15 DIAGNOSIS — M79671 Pain in right foot: Secondary | ICD-10-CM | POA: Diagnosis not present

## 2022-06-15 DIAGNOSIS — R251 Tremor, unspecified: Secondary | ICD-10-CM | POA: Diagnosis not present

## 2022-06-15 DIAGNOSIS — M21949 Unspecified acquired deformity of hand, unspecified hand: Secondary | ICD-10-CM | POA: Diagnosis not present

## 2022-06-15 DIAGNOSIS — N400 Enlarged prostate without lower urinary tract symptoms: Secondary | ICD-10-CM | POA: Diagnosis not present

## 2022-06-15 DIAGNOSIS — M79672 Pain in left foot: Secondary | ICD-10-CM | POA: Diagnosis not present

## 2022-06-19 NOTE — Progress Notes (Incomplete)
Psychotherapy Progress Note [facility] [name], PhD LP  Patient ID: Jeremy [name] [facility])    MRN: [id number] Therapy format: Individual psychotherapy Date: 06/14/2022      Start: 1:10p     Stop: 2:00p     Time Spent: 50 min Location: In-person   Session narrative (presenting needs, interim history, self-report of stressors and symptoms, applications of prior therapy, status changes, and interventions made in session) 6 wks since last seen.  Med check a month ago found continuing absent motivation, sufficient ADLs but still wasting, time in bed, and now losing mobility in his hands.  Reluctantly agreed to Abilify, and allegedl accepted restarting broad vitamin supplementation.  In person today, says it's just dawning on him how much he's let himself go.  Compliant with Abilify, quick to note "the deal" was to try it a month, and the month is almost up.  Appetite up further,, with though no apparent weight gain.  Appt tomorrow with PCP Dr. Theda Sers to go over annual physical blood work + vitamin assay.    Says [name] has been making lots of greens since last visit, he has been eating them, and they taste good to him.  Current weight 118 lbs, 22 below ideal but 19 above the worst a year ago.  C/o constipation again, using Miralax on a PRN basis. Not taking it daily until the last few days, now considering 2 doses/day.  Advocated continuing kale and collards, and incorporating some meat if possible.  Had a bit at Christmas -- lamb, chicken.  Still uses pea protein supplement that supposedly has all the amino acids, but only 1 out of 3 days at the most.  Will have 2 eggs a couple days, just oatmeal a couple days.  Has not picked up MVI, B complex, and D.  Is on a "painkiller" (curcumin/turmeric) and omega 3 (fish oil, uncertain what dose).  Back pain is getting mor acute, with some stabbing pain most likely from tightened muscles.  Suspecting gout now, too, with aching feet and  nighttime acute big-toe pain.  And believes the hands are probably gout, too.  Will address it with Dr. Theda Sers tomorrow, too.  Admittedly still trending dehydrated.  Says his fungus is spreading higher, too, and he is not able to apply the podiatrist's cream regularly or competently.  Unsure he has the hand condition or the supply of antifungal to combat the problem adequately.  Does require [name]'s help for nail trims, shoe tying, some small-scale help dressing, but if it came to toileting or diaper changes, she could not abide it.    Therapeutic modalities: {AM:23362::"Cognitive Behavioral Therapy","Solution-Oriented/Positive Psychology"}  Mental Status/Observations:  Appearance:   {PSY:22683}     Behavior:  {PSY:21022743}  Motor:  {PSY:22302}  Speech/Language:   {PSY:22685}  Affect:  {PSY:22687}  Mood:  {PSY:31886}  Thought process:  {PSY:31888}  Thought content:    {PSY:[phone]}  Sensory/Perceptual disturbances:    {[phone]}  Orientation:  {Psych Orientation:23301::"Fully oriented"}  Attention:  {Good-Fair-Poor ratings:23770::"Good"}    Concentration:  {Good-Fair-Poor ratings:23770::"Good"}  Memory:  {PSY:4073814212}  Insight:    {Good-Fair-Poor ratings:23770::"Good"}  Judgment:   {Good-Fair-Poor ratings:23770::"Good"}  Impulse Control:  {Good-Fair-Poor ratings:23770::"Good"}   Risk Assessment: Danger to Self: {Risk:22599::"No"} Self-injurious Behavior: {Risk:22599::"No"} Danger to Others: {Risk:22599::"No"} Physical Aggression / Violence: {Risk:22599::"No"} Duty to Warn: {AMYesNo:22526::"No"} Access to Firearms a concern: {AMYesNo:22526::"No"}  Assessment of progress:  {Progress:22147::"progressing"}  Diagnosis: No diagnosis found. Plan:  Priority recommendations: Restart B complex Find more protein Assess possible need  for home health  Message podiatry and PCP about antifungal treatment complications and recommendaions Other recommendations/advice as may be  noted above Continue to utilize previously learned skills ad lib Maintain medication as prescribed and work faithfully with relevant prescriber(s) if any changes are desired or seem indicated Call the clinic on-call service, 988/hotline, 911, or present to [facility] or ER if any life-threatening psychiatric crisis Return for as already scheduled, recommend regular service to promote recovery. Already scheduled visit in this office 06/21/2022.  Blanchie Serve, PhD [name], PhD LP Clinical Psychologist, [facility] [address] [place], [place] [address] [phone]

## 2022-06-20 ENCOUNTER — Ambulatory Visit (INDEPENDENT_AMBULATORY_CARE_PROVIDER_SITE_OTHER): Payer: Medicare Other | Admitting: Podiatry

## 2022-06-20 VITALS — BP 130/80

## 2022-06-20 DIAGNOSIS — B353 Tinea pedis: Secondary | ICD-10-CM

## 2022-06-20 DIAGNOSIS — L853 Xerosis cutis: Secondary | ICD-10-CM

## 2022-06-20 MED ORDER — CLOTRIMAZOLE-BETAMETHASONE 1-0.05 % EX CREA: 1.0000 | TOPICAL_CREAM | Freq: Two times a day (BID) | CUTANEOUS | 0 refills | Status: DC

## 2022-06-20 MED ORDER — AMMONIUM LACTATE 12 % EX LOTN: 1.0000 | TOPICAL_LOTION | CUTANEOUS | 0 refills | Status: DC | PRN

## 2022-06-20 NOTE — Progress Notes (Unsigned)
Subjective:  Patient ID: Jeremy [name], male    DOB: Jul 25, 1935,  MRN: [id number]  Chief Complaint  Patient presents with   Tinea Pedis    87 y.o. male presents with the above complaint.  Patient presents with bilateral athlete's foot.  He said Lotrisone cream did not help much.  He states has been applying it every single day.  He also has some dry skin he would like to discuss treatment options for that.  Review of Systems: Negative except as noted in the HPI. Denies N/V/F/Ch.  Past Medical History:  Diagnosis Date   Aortic atherosclerosis (HCC)    BPH (benign prostatic hyperplasia)    Bronchiectasis (HCC)    Depression    Depression    GAD (generalized anxiety disorder)     Current Outpatient Medications:    ARIPiprazole (ABILIFY) 2 MG tablet, TAKE 1 TABLET BY MOUTH EVERY DAY (Patient not taking: Reported on 05/15/2022), Disp: 90 tablet, Rfl: 0   b complex vitamins capsule, Take 1 capsule by mouth daily. (Patient not taking: Reported on 03/06/2022), Disp: , Rfl:    cholecalciferol (VITAMIN D3) 25 MCG (1000 UNIT) tablet, Take 1,000 Units by mouth daily. (Patient not taking: Reported on 03/06/2022), Disp: , Rfl:    clotrimazole-betamethasone (LOTRISONE) cream, APPLY TO AFFECTED AREA TWICE A DAY, Disp: 30 g, Rfl: 0   DULoxetine (CYMBALTA) 60 MG capsule, Take 2 capsules (120 mg total) by mouth daily., Disp: 180 capsule, Rfl: 1   finasteride (PROSCAR) 5 MG tablet, Take 5 mg by mouth every morning., Disp: , Rfl:    LORazepam (ATIVAN) 0.5 MG tablet, Take 1/2-1 tablet twice daily as needed for severe anxiety/panic, Disp: 60 tablet, Rfl: 1   magnesium 30 MG tablet, Take 30 mg by mouth daily. (Patient not taking: Reported on 07/19/2021), Disp: , Rfl:    mirtazapine (REMERON) 30 MG tablet, Take 1 tablet (30 mg total) by mouth at bedtime., Disp: 90 tablet, Rfl: 1   Omega-3 Fatty Acids (OMEGA 3 500 PO), Take by mouth., Disp: , Rfl:    risperiDONE (RISPERDAL) 3 MG tablet, Take 1 tablet (3 mg total)  by mouth at bedtime., Disp: 90 tablet, Rfl: 1   tamsulosin (FLOMAX) 0.4 MG CAPS capsule, Take 0.4 mg by mouth every evening., Disp: , Rfl:    Turmeric (QC TUMERIC COMPLEX PO), Take by mouth., Disp: , Rfl:   Social History   Tobacco Use  Smoking Status Never  Smokeless Tobacco Never    Allergies  Allergen Reactions   Aspergillus Allergy Skin Test Cough   Aspirin     Irritates stomach   Sulfa Antibiotics Other (See Comments)    Childhood    Theophyllines Other (See Comments)    Does not remember   Objective:   Vitals:   06/20/22 1430  BP: 130/80   There is no height or weight on file to calculate BMI. Constitutional Well developed. Well nourished.  Vascular Dorsalis pedis pulses palpable bilaterally. Posterior tibial pulses palpable bilaterally. Capillary refill normal to all digits.  No cyanosis or clubbing noted. Pedal hair growth normal.  Neurologic Normal speech. Oriented to person, place, and time. Epicritic sensation to light touch grossly present bilaterally.  Dermatologic Bilateral skin epidermal lysis noted with subjective complaint of itching consistent with athlete's foot.there is some xerosis noted with moderate xerosis to both plantar lower extremity  Orthopedic: Normal joint ROM without pain or crepitus bilaterally. No visible deformities. No bony tenderness.   Radiographs: None Assessment:   1. Tinea pedis of  both feet   2. Xerosis of skin     Plan:  Patient was evaluated and treated and all questions answered.  Bilateral athlete's foot -All questions and concerns were discussed with the patient extensive detail.   -Refill for Lotrisone cream was sent to the pharmacy seems to help a little bit but will need to apply more.  Bilateral xerosis -I explained to the patient the etiology of xerosis of her treatment options were discussed.  He will benefit from ammonium lactate to be applied twice a day.  Ammonium lactate was sent to the pharmacy.  He  states understanding.   No follow-ups on file.

## 2022-06-21 ENCOUNTER — Encounter: Payer: Self-pay | Admitting: Physician Assistant

## 2022-06-21 ENCOUNTER — Ambulatory Visit (INDEPENDENT_AMBULATORY_CARE_PROVIDER_SITE_OTHER): Payer: Medicare Other | Admitting: Physician Assistant

## 2022-06-21 DIAGNOSIS — F332 Major depressive disorder, recurrent severe without psychotic features: Secondary | ICD-10-CM | POA: Diagnosis not present

## 2022-06-21 DIAGNOSIS — F411 Generalized anxiety disorder: Secondary | ICD-10-CM

## 2022-06-21 DIAGNOSIS — R5383 Other fatigue: Secondary | ICD-10-CM

## 2022-06-21 DIAGNOSIS — R5381 Other malaise: Secondary | ICD-10-CM | POA: Diagnosis not present

## 2022-06-21 MED ORDER — VITAMIN D 50 MCG (2000 UT) PO CAPS: 4000.0000 [IU] | ORAL_CAPSULE | Freq: Every day | ORAL | Status: DC

## 2022-06-21 NOTE — Progress Notes (Signed)
[facility]  Patient ID: Jeremy [name],  MRN: [id number]  PCP: Janie Morning, DO  Date of Evaluation: 06/21/2022 Time spent:30 minutes  Chief Complaint:  Chief Complaint   Depression    HISTORY/CURRENT STATUS: HPI  For routine med check  "I'm depressed." Didn't take Abilify prescribed at the [place].  "I don't want to change anything."  Stays in bed the vast majority of the time. Gets up to go to the BR and to eat, that's it. Sleeps all the time, does nothing for fun. Low energy.  Doesn't cry easily.  Feels hopeless. Personal hygiene is normal.   Denies any changes in concentration, making decisions, or remembering things.  Appetite has not changed.  Weight is stable. No PA but gets overwhelmed with life, one reason he stay in bed.  Denies suicidal or homicidal thoughts although he doesn't care if he dies.   Patient denies increased energy with decreased need for sleep, increased talkativeness, racing thoughts, impulsivity or risky behaviors, increased spending, increased libido, grandiosity, increased irritability or anger, paranoia, or hallucinations.  Denies dizziness, syncope, seizures, numbness, tingling, tremor, tics, unsteady gait, slurred speech, confusion. Denies muscle or joint pain, stiffness, or dystonia. Denies unexplained weight loss, frequent infections, or sores that heal slowly.  No polyphagia, polydipsia, or polyuria. Denies visual changes or paresthesias.  PCP follows his labs.  Individual Medical History/ Review of Systems: Changes? :No      Past medications for mental health diagnoses include: (None listed in old chart) Lithium he took only for a few weeks for severe depression with suicidal thoughts but did not like it so he stopped it.  cymbalta, mirtazapine, Ativan,   Was hospitalized many times since 1964, most recent was in 2005.   Allergies: Aspergillus allergy skin test, Aspirin, Sulfa antibiotics, and Theophyllines  Current Medications:  Current  Outpatient Medications:    Cholecalciferol (VITAMIN D) 50 MCG (2000 UT) CAPS, Take 2 capsules (4,000 Units total) by mouth daily., Disp: 60 capsule, Rfl:    clotrimazole-betamethasone (LOTRISONE) cream, APPLY TO AFFECTED AREA TWICE A DAY, Disp: 30 g, Rfl: 0   clotrimazole-betamethasone (LOTRISONE) cream, Apply 1 Application topically 2 (two) times daily., Disp: 30 g, Rfl: 0   DULoxetine (CYMBALTA) 60 MG capsule, Take 2 capsules (120 mg total) by mouth daily., Disp: 180 capsule, Rfl: 1   finasteride (PROSCAR) 5 MG tablet, Take 5 mg by mouth every morning., Disp: , Rfl:    mirtazapine (REMERON) 30 MG tablet, Take 1 tablet (30 mg total) by mouth at bedtime., Disp: 90 tablet, Rfl: 1   Omega-3 Fatty Acids (OMEGA 3 500 PO), Take by mouth., Disp: , Rfl:    risperiDONE (RISPERDAL) 3 MG tablet, Take 1 tablet (3 mg total) by mouth at bedtime., Disp: 90 tablet, Rfl: 1   tamsulosin (FLOMAX) 0.4 MG CAPS capsule, Take 0.4 mg by mouth every evening., Disp: , Rfl:    Turmeric (QC TUMERIC COMPLEX PO), Take by mouth., Disp: , Rfl:    ammonium lactate (AMLACTIN DAILY) 12 % lotion, Apply 1 Application topically as needed for dry skin. (Patient not taking: Reported on 06/21/2022), Disp: 400 g, Rfl: 0   b complex vitamins capsule, Take 1 capsule by mouth daily. (Patient not taking: Reported on 03/06/2022), Disp: , Rfl:    LORazepam (ATIVAN) 0.5 MG tablet, Take 1/2-1 tablet twice daily as needed for severe anxiety/panic (Patient not taking: Reported on 06/21/2022), Disp: 60 tablet, Rfl: 1   magnesium 30 MG tablet, Take 30 mg by mouth daily. (  Patient not taking: Reported on 07/19/2021), Disp: , Rfl:  Medication Side Effects: none  Family Medical/ Social History: Changes? No  MENTAL HEALTH EXAM:  There were no vitals taken for this visit.There is no height or weight on file to calculate BMI.    General Appearance: Casual and Well Groomed  Eye Contact:  Good  Speech:  Clear and Coherent and Normal Rate  Volume:   Decreased  Mood:  Depressed  Affect:  Depressed  Thought Process:  Goal Directed and Descriptions of Associations: Circumstantial  Orientation:  Full (Time, Place, and Person)  Thought Content: Logical   Suicidal Thoughts:  No  Homicidal Thoughts:  No  Memory:  WNL  Judgement:  Good  Insight:  Good  Psychomotor Activity:   Mild fine motor tremor of hands bilaterally.  Concentration:  Concentration: Good and Attention Span: Good  Recall:  Good  Fund of Knowledge: Good  Language: Good  Assets:  Industrial/product designer  ADL's:  Intact  Cognition: WNL  Prognosis:  Poor   See labs  06/23/2022 CBC nl Glu 144  DIAGNOSES:    ICD-10-CM   1. Severe episode of recurrent major depressive disorder, without psychotic features ([place])  F33.2     2. Malaise and fatigue  R53.81    R53.83     3. Generalized anxiety disorder  F41.1       Receiving Psychotherapy: Yes Dr. Jonni Sanger [place]   RECOMMENDATIONS:  PDMP was reviewed. Ativan filled 04/26/2021. I provided  30 minutes of face to face time during this encounter, including time spent before and after the visit in records review, medical decision making, counseling pertinent to today's visit, and charting.   It's very difficult to treat his depression b/c he doesn't want to make any changes. States it's hopeless and he will live with it vs risking new meds with possible SE. Doesn't want to increase dose of Risperdal.  Discussed vitamins and supplements and benefit for brain health and mood. Also better nutrition. States "I'll try."   Start vitamin D OTC 2000 IUs, 2 p.o. daily. Restart B complex. Continue Cymbalta 60 mg, 2 p.o. every morning.  Continue mirtazapine 30 mg, 1 p.o. nightly prn. Continue risperidone 3 mg, 1 p.o. nightly. Continue therapy with Dr. [name].  Return in 6 weeks.  Donnal Moat, PA-C

## 2022-07-03 ENCOUNTER — Other Ambulatory Visit: Payer: Self-pay | Admitting: Physician Assistant

## 2022-07-03 ENCOUNTER — Ambulatory Visit (INDEPENDENT_AMBULATORY_CARE_PROVIDER_SITE_OTHER): Payer: Medicare Other | Admitting: Psychiatry

## 2022-07-03 DIAGNOSIS — F322 Major depressive disorder, single episode, severe without psychotic features: Secondary | ICD-10-CM | POA: Diagnosis not present

## 2022-07-03 DIAGNOSIS — F418 Other specified anxiety disorders: Secondary | ICD-10-CM

## 2022-07-03 DIAGNOSIS — Z8659 Personal history of other mental and behavioral disorders: Secondary | ICD-10-CM | POA: Diagnosis not present

## 2022-07-03 DIAGNOSIS — Z9189 Other specified personal risk factors, not elsewhere classified: Secondary | ICD-10-CM

## 2022-07-03 NOTE — Progress Notes (Signed)
Psychotherapy Progress Note [facility] [name], PhD LP  Patient ID: Jeremy [name] [facility])    MRN: [id number] Therapy format: Individual psychotherapy Date: 07/03/2022      Start: 11:10     Stop: 12:00n     Time Spent: 50 min Location: In-person   Session narrative (presenting needs, interim history, self-report of stressors and symptoms, applications of prior therapy, status changes, and interventions made in session) Since last seen, has been to podiatry, and indications he may have been mistaking abnormally dry skin for athlete's foot spreading up his legs. Two topicals prescribed, and advice to use more adequate amount of antifungal.  Med check notes he did not take Abilify as recommended, still sequesters to bed, maintains hopeless outlook.  Advised to restart B complex and D, which had lapsed.    Stiff gait, did not want to come this morning but did.  Responsive to humor.  Claims podiatrist did not actually look above his ankle, being professionally forbidden (?),  and so was diagnosing skin condition and prescribing on spec.  Brings printout of labs done at primary care as requested.  All normal except mild anemia, mild low D, and abnormally high LDL (181) and HDL (99).  No signs of thyroid or kidney trouble.  Weight 114.6.  Re. hand complaint, PCP suspected RA, and has rheumatology colleagues there, but blood work and xrays negative.  Referred to PT [facility]), but he is doubtful of benefit.  Requires Marian's prompt to make it through dressing, but still physically independent except for buttons and shoelaces.    Validated that he took prompting and got dressed, encouraging that he succeeded, actually, doing that much and getting here.  Signs of life for someone who is maybe undecided about living.  Encouraged to take on PT, possibly ask about OT for home problem-solving and hand work.  Encouraged to follow through on the understanding that he needs to physically break  what his obsessive mind is doing holding still, withdrawing, and tightening up soft tissue everywhere.  Reviewed nutritional compliance, still spotty.  Did take note from [name] last visit, says he is somewhat more regular with vitamin D but did not raise the amount as recommended.  Still not regular with B complex.  Turmeric/curcumin regular, fish oil intermittent.  Encouraged to regulate all of them without objection and take the higher vitamin D as a booster for antidepressant.  Turns out he was recommended to '4mg'$  risperidone but did not comply.  Socially, remains pervasively withdrawn but allows visitors.  Probed news of loved ones, says he is more or less marginalized now.  [place] son Peramin, who relocated to [place], fell off communicating, as his efforts kept going unanswered, but they did stop by for lunch in November (the amount of visit [place] "allowed").  Shared children [name] and [name] he says communicate with [name], but still distant with them, and he is fatalistic about it.  Evasive about whether he wants more with them.  Supportively confronted teaching them to shy away by not answering/engaging and challenged whether he means to foster estrangement or just sees it fatalistically.  Jadene Pierini and his wife come over, visited and changed smoke alarm.    When asked after session, [name] corroborates the above.  Interpreted his depression as much more about neglectful habit now, and rigidly acting out a fatalistic viewpoint, whereas it did involve some delirious-making neglect earlier on.  Encouraged to question depressive conclusions and check facts in every way possible,  but at this point emphasize behavioral activation and bodily movement and flexibility.  Suggestions to walk the driveway, even once, instead of confining to the house, and to take up friend Jenny Reichmann on walking with him a bit and to keep visiting, with or without a (stressful) decision.  Recommended she prompt the adult children  to speak freely about challenging him to break out of rigidity, highlight how he can more than he should, invite his own wish to feel better, and connect it to small steps.  Currently scheduled for 4-wk followup, recommend 2 but doubtful he will concede, nor [name] force the issue.  Discussed afterward with Ms. [name].  Agree that geriatric inpatient program may be in the offing, just not sure of criteria yet for admission, and willing to give PT referral and more active collaboration with PCP and support system a chance.  Therapeutic modalities: Cognitive Behavioral Therapy, Solution-Oriented/Positive Psychology, Ego-Supportive, and directive  Mental Status/Observations:  Appearance:   Neat and pale, drawn      Behavior:  Rigid  Motor:  Stiff gait  Speech/Language:   Coherent, hoarse  Affect:  Flat and mildly responsive to humor  Mood:  depressed  Thought process:  normal  Thought content:    Rumination and overvalued ideas  Sensory/Perceptual disturbances:    grossly intact, Q of misperceiving somatic conditions  Orientation:  grossly intact  Attention:  Good    Concentration:  Good  Memory:  grossly intact  Insight:    Fair  Judgment:   Fair  Impulse Control:  Fair   Risk Assessment: Danger to Self: No Self-injurious Behavior:  partial neglect  Danger to Others: No Physical Aggression / Violence: No Duty to Warn: No Access to Firearms a concern: No  Assessment of progress:   continue guarded  Diagnosis:   ICD-10-CM   1. Severe depression ([place])  F32.2     2. Anxiety with somatic features  F41.8     3. Potential for self care deficit  Z91.89     4. History of psychosis  Z86.59      Plan:  Nutrition -- Fully adopt vitamin supplementation as recommended to ensure sufficient availability of B complex, D, protein, and antiinflammatory/neuroprotective elements like omega 3 and turmeric.  Improve hydration by monitoring intake, maybe using a 32 oz or larger bottle, with  straw, for visible measurement in a day.  Assume PT's estimation is unreliable. Bowels -- May stick with Miralax but seek fluids, fiber, and more movement to recover natural bowel control Physical activation -- Stretch, walk, even if it is only once down the driveway and back once, and break up long times on the bed.  Endorse wife being more assertive, "Nurse Izora Gala" as she calls it. Potential placement or auxiliary services -- Consider trigger conditions to engage home health and PT, possibly home-based.  (PCP appears to have authorized PT.)  Consider referral to psychiatric hospital (geri program, e.g., [place]) or rehab to address intractable self-care deficits and wasting. Health care side issues -- Reconcile PCP and podiatry treatment strategies for what is thought to be widespread fungal infection, but may have more to do with bed time and lack of movement. Other recommendations/advice as may be noted above Continue to utilize previously learned skills ad lib Maintain medication as prescribed and work faithfully with relevant prescriber(s) if any changes are desired or seem indicated Call the clinic on-call service, 988/hotline, 911, or present to [facility] or ER if any life-threatening psychiatric crisis Return preferably q 2  wks, for needs reschedule with prescriber. Already scheduled visit in this office 07/31/2022.  Blanchie Serve, PhD [name], PhD LP Clinical Psychologist, [facility] [address], [place], [place] [address] [phone]

## 2022-07-31 ENCOUNTER — Ambulatory Visit (INDEPENDENT_AMBULATORY_CARE_PROVIDER_SITE_OTHER): Payer: Medicare Other | Admitting: Psychiatry

## 2022-07-31 DIAGNOSIS — Z8659 Personal history of other mental and behavioral disorders: Secondary | ICD-10-CM | POA: Diagnosis not present

## 2022-07-31 DIAGNOSIS — R69 Illness, unspecified: Secondary | ICD-10-CM

## 2022-07-31 DIAGNOSIS — F322 Major depressive disorder, single episode, severe without psychotic features: Secondary | ICD-10-CM

## 2022-07-31 DIAGNOSIS — Z9189 Other specified personal risk factors, not elsewhere classified: Secondary | ICD-10-CM | POA: Diagnosis not present

## 2022-07-31 DIAGNOSIS — F418 Other specified anxiety disorders: Secondary | ICD-10-CM

## 2022-07-31 NOTE — Progress Notes (Signed)
Psychotherapy Progress Note [facility] [name], PhD LP  Patient ID: Jeremy [name] [facility])    MRN: [id number] Therapy format: Family therapy w/ patient -- accompanied by [name] part time Date: 07/31/2022      Start: 2:10p     Stop: 3:00p     Time Spent: 50 min Location: In-person   Session narrative (presenting needs, interim history, self-report of stressors and symptoms, applications of prior therapy, status changes, and interventions made in session) PT has not started.  Confusion about how and who takes responsibility, as well as what for, but it could be for hands, back, and reconditioning simultaneously.  Called medical provider during session, leaving message for his PCP's nurse, to learn what he should do next, as it has been a month without action.  Call not returned during session, but turns out wife received a call back and was informed they should check again with the therapy provider.  It does not answer what it's for and whether it adequately recognizes his needs, but presumably an assessment will clarify once it occurs.    On his own, Kayvion has not walked the driveway, as agreed, and no report of getting together with friend Jenny Reichmann.  Believes maybe he has taken to the bed more, in fact, affected by his hands, being locked the way they are -- which still looks to be muscular and is tested negative for arthritis inflammation -- and by back pain associated with chronic tension.    Confronted lack of effort challenging his now-chronic physical habits and led in light stretching in session, including back, legs, torso, waist, and hand flexibility using a stress ball, his own hands, and light pressure from [place] to explore flexibility.  Recommend home exercises until professional recommendations can be had, including warm water soaks, with or without Epsom salt, and manually flexing his fingers.    Therapeutic modalities: Cognitive Behavioral Therapy,  Solution-Oriented/Positive Psychology, and directive  Mental Status/Observations:  Appearance:   Neat     Behavior:  Rigid  Motor:  rigid  Speech/Language:   Clear and Coherent and husky  Affect:  Constricted and more responsive to humor  Mood:  depressed  Thought process:  normal, some blocking  Thought content:    Rumination  Sensory/Perceptual disturbances:    Perceived inability to move areas  Orientation:  Fully oriented  Attention:  Good    Concentration:  Good  Memory:  WNL  Insight:    Fair  Judgment:   Fair  Impulse Control:  Good   Risk Assessment: Danger to Self: No Self-injurious Behavior:  self-neglect Danger to Others: No Physical Aggression / Violence: No Duty to Warn: No Access to Firearms a concern: No  Assessment of progress:  stabilized  Diagnosis:   ICD-10-CM   1. Severe depression ([place])  F32.2     2. Anxiety with somatic features  F41.8     3. Potential for self care deficit  Z91.89     4. History of psychosis  Z86.59     5. r/o ARCD  R69      Plan:  Nutrition -- Fully adopt vitamin supplementation as recommended to ensure availability of B complex, D, protein, and antiinflammatory/neuroprotective elements like omega 3 and turmeric.  Improve hydration by monitoring intake, maybe using a 32 oz or larger bottle, with straw, for visible measurement in a day.  Assume PT's estimation is unreliable. Bowels -- May stick with Miralax but seek fluids, fiber, and more movement to  recover natural bowel control Physical activation -- Stretch, walk, even if it is only once down the driveway and back once, and break up long times on the bed.  Endorse wife being more assertive, "Nurse Izora Gala" as she calls it, and recruiting friend Jenny Reichmann to visit, move with him.  Engage physical therapy ASAP.  Failing these, consider assessing for geriatric rehab or gerospych admission. Potential placement or auxiliary services -- Consider trigger conditions to engage home health and  PT, possibly home-based.  (PCP appears to have authorized PT.)  Consider referral to psychiatric hospital (geri program, e.g., [place]) or rehab to address intractable self-care deficits and wasting. Health care side issues -- Reconcile PCP and podiatry treatment strategies for what is thought to be widespread fungal infection, but may have more to do with bed time and lack of movement. Other recommendations/advice as may be noted above Continue to utilize previously learned skills ad lib Maintain medication as prescribed and work faithfully with relevant prescriber(s) if any changes are desired or seem indicated Call the clinic on-call service, 988/hotline, 911, or present to [facility] or ER if any life-threatening psychiatric crisis No follow-ups on file. Already scheduled visit in this office 08/09/2022.  Blanchie Serve, PhD [name], PhD LP Clinical Psychologist, [facility] [address], [place], [place] [address] [phone]

## 2022-08-02 ENCOUNTER — Ambulatory Visit: Payer: Medicare Other | Admitting: Physician Assistant

## 2022-08-08 ENCOUNTER — Ambulatory Visit: Payer: Medicare Other | Admitting: Physician Assistant

## 2022-08-09 ENCOUNTER — Encounter: Payer: Self-pay | Admitting: Physician Assistant

## 2022-08-09 ENCOUNTER — Ambulatory Visit (INDEPENDENT_AMBULATORY_CARE_PROVIDER_SITE_OTHER): Payer: Medicare Other | Admitting: Physician Assistant

## 2022-08-09 VITALS — Wt 109.2 lb

## 2022-08-09 DIAGNOSIS — F411 Generalized anxiety disorder: Secondary | ICD-10-CM | POA: Diagnosis not present

## 2022-08-09 DIAGNOSIS — R64 Cachexia: Secondary | ICD-10-CM

## 2022-08-09 DIAGNOSIS — R634 Abnormal weight loss: Secondary | ICD-10-CM

## 2022-08-09 DIAGNOSIS — F322 Major depressive disorder, single episode, severe without psychotic features: Secondary | ICD-10-CM

## 2022-08-09 DIAGNOSIS — R630 Anorexia: Secondary | ICD-10-CM | POA: Diagnosis not present

## 2022-08-09 MED ORDER — MIRTAZAPINE 45 MG PO TABS: 45.0000 mg | ORAL_TABLET | Freq: Every day | ORAL | 1 refills | Status: DC

## 2022-08-09 MED ORDER — RISPERIDONE 3 MG PO TABS: 3.0000 mg | ORAL_TABLET | Freq: Every day | ORAL | 1 refills | Status: DC

## 2022-08-09 MED ORDER — DULOXETINE HCL 60 MG PO CPEP: 120.0000 mg | ORAL_CAPSULE | Freq: Every day | ORAL | 1 refills | Status: DC

## 2022-08-09 NOTE — Progress Notes (Signed)
[facility]  Patient ID: Jeremy [name],  MRN: [id number]  PCP: Janie Morning, DO  Date of Evaluation: 08/09/2022 Time spent:30 minutes  Chief Complaint:  Chief Complaint   Depression; Follow-up    HISTORY/CURRENT STATUS: HPI  For 6 week med check  Still not doing well. Stays in bed all the time except to go to the bathroom, or kitchen, or to appointments. Has decreased appetite. Depressed, no energy or motivation. Personal hygiene is nl.  No SI/HI.   Hands are still and painful, limited mobility in them. Has seen PCP who consulted rheum, OA dx, no RA.   Denies dizziness, syncope, seizures, numbness, tingling, tremor, tics, unsteady gait, slurred speech, confusion. Denies frequent infections, or sores that heal slowly.  No polyphagia, polydipsia, or polyuria. Denies visual changes or paresthesias.  PCP follows his labs.  Individual Medical History/ Review of Systems: Changes? :No      Past medications for mental health diagnoses include: (None listed in old chart) Lithium he took only for a few weeks for severe depression with suicidal thoughts but did not like it so he stopped it.  cymbalta, mirtazapine, Ativan,   Was hospitalized many times since 1964, most recent was in 2005.   Allergies: Aspergillus allergy skin test, Aspirin, Sulfa antibiotics, and Theophyllines  Current Medications:  Current Outpatient Medications:    Cholecalciferol (VITAMIN D) 50 MCG (2000 UT) CAPS, Take 2 capsules (4,000 Units total) by mouth daily., Disp: 60 capsule, Rfl:    clotrimazole-betamethasone (LOTRISONE) cream, APPLY TO AFFECTED AREA TWICE A DAY, Disp: 30 g, Rfl: 0   clotrimazole-betamethasone (LOTRISONE) cream, Apply 1 Application topically 2 (two) times daily., Disp: 30 g, Rfl: 0   finasteride (PROSCAR) 5 MG tablet, Take 5 mg by mouth every morning., Disp: , Rfl:    mirtazapine (REMERON) 45 MG tablet, Take 1 tablet (45 mg total) by mouth at bedtime., Disp: 30 tablet, Rfl: 1    Omega-3 Fatty Acids (OMEGA 3 500 PO), Take by mouth., Disp: , Rfl:    tamsulosin (FLOMAX) 0.4 MG CAPS capsule, Take 0.4 mg by mouth every evening., Disp: , Rfl:    Turmeric (QC TUMERIC COMPLEX PO), Take by mouth., Disp: , Rfl:    ammonium lactate (AMLACTIN DAILY) 12 % lotion, Apply 1 Application topically as needed for dry skin. (Patient not taking: Reported on 06/21/2022), Disp: 400 g, Rfl: 0   b complex vitamins capsule, Take 1 capsule by mouth daily. (Patient not taking: Reported on 03/06/2022), Disp: , Rfl:    DULoxetine (CYMBALTA) 60 MG capsule, Take 2 capsules (120 mg total) by mouth daily., Disp: 180 capsule, Rfl: 1   LORazepam (ATIVAN) 0.5 MG tablet, Take 1/2-1 tablet twice daily as needed for severe anxiety/panic (Patient not taking: Reported on 06/21/2022), Disp: 60 tablet, Rfl: 1   magnesium 30 MG tablet, Take 30 mg by mouth daily. (Patient not taking: Reported on 07/19/2021), Disp: , Rfl:    risperiDONE (RISPERDAL) 3 MG tablet, Take 1 tablet (3 mg total) by mouth at bedtime., Disp: 90 tablet, Rfl: 1 Medication Side Effects: none  Family Medical/ Social History: Changes? No  MENTAL HEALTH EXAM:  Weight 109 lb 3.2 oz (49.5 kg).Body mass index is 18.17 kg/m.  (9 # loss in 3 months)  General Appearance: Casual, Well Groomed, and thin, cachetic, severe arthritic changes in DIPs, MCPs in 1st, 2nd, 3rd, and 4th digits on hands bilat.   Eye Contact:  Good  Speech:  Clear and Coherent and Normal Rate  Volume:  Decreased  Mood:  Depressed  Affect:  Depressed  Thought Process:  Goal Directed and Descriptions of Associations: Circumstantial  Orientation:  Full (Time, Place, and Person)  Thought Content: Logical   Suicidal Thoughts:  No  Homicidal Thoughts:  No  Memory:  WNL  Judgement:  Good  Insight:  Good  Psychomotor Activity:   walks slowly, no obvious tremor, decrease ROM and grip in hands bilat  Concentration:  Concentration: Good and Attention Span: Good  Recall:  Good  Fund of  Knowledge: Good  Language: Good  Assets:  Industrial/product designer  ADL's:  Intact  Cognition: WNL  Prognosis:  Poor   PCP follows labs  DIAGNOSES:    ICD-10-CM   1. Severe depression ([place])  F32.2     2. Loss of weight  R63.4     3. Generalized anxiety disorder  F41.1     4. Cachexia ([place])  R64     5. Decreased appetite  R63.0       Receiving Psychotherapy: Yes Dr. Jonni Sanger Mitchum   RECOMMENDATIONS:  PDMP was reviewed. Ativan filled 04/26/2021. I provided 30 minutes of face to face time during this encounter, including time spent before and after the visit in records review, medical decision making, counseling pertinent to today's visit, and charting.   It's very difficult to treat his depression b/c he doesn't want to make any changes. He doesn't want to risk possible SE with new meds. He is willing to increase the Mirtazapine, since he's been on it for awhile, has had no SE. Doesn't want to increase dose of Risperdal.    Continue vitamin D OTC 2000 IUs, 2 p.o. daily. Continue B complex. Continue Cymbalta 60 mg, 2 p.o. every morning. Increase Mirtazapine to 45 mg, 1 p.o. nightly prn. Continue risperidone 3 mg, 1 p.o. nightly. Continue therapy with Dr. [name].  Return in 6 weeks.  Donnal Moat, PA-C

## 2022-08-12 ENCOUNTER — Encounter: Payer: Self-pay | Admitting: Physician Assistant

## 2022-08-22 ENCOUNTER — Other Ambulatory Visit: Payer: Self-pay | Admitting: Physician Assistant

## 2022-08-27 ENCOUNTER — Telehealth: Payer: Self-pay

## 2022-08-27 ENCOUNTER — Other Ambulatory Visit: Payer: Self-pay | Admitting: Physician Assistant

## 2022-08-27 MED ORDER — MIRTAZAPINE 30 MG PO TABS: 30.0000 mg | ORAL_TABLET | Freq: Every day | ORAL | 1 refills | Status: DC

## 2022-08-27 NOTE — Telephone Encounter (Signed)
Rx sent 

## 2022-08-27 NOTE — Telephone Encounter (Signed)
Yes, 30 mg is fine. For the record, part of the reason for increasing the mirtazapine was to increase appetite and help him gain weight.

## 2022-08-29 ENCOUNTER — Ambulatory Visit: Payer: Medicare Other | Admitting: Psychiatry

## 2022-09-01 ENCOUNTER — Other Ambulatory Visit: Payer: Self-pay | Admitting: Physician Assistant

## 2022-09-14 ENCOUNTER — Ambulatory Visit (INDEPENDENT_AMBULATORY_CARE_PROVIDER_SITE_OTHER): Payer: Medicare Other | Admitting: Psychiatry

## 2022-09-14 DIAGNOSIS — F322 Major depressive disorder, single episode, severe without psychotic features: Secondary | ICD-10-CM | POA: Diagnosis not present

## 2022-09-14 DIAGNOSIS — Z8659 Personal history of other mental and behavioral disorders: Secondary | ICD-10-CM | POA: Diagnosis not present

## 2022-09-14 DIAGNOSIS — F411 Generalized anxiety disorder: Secondary | ICD-10-CM

## 2022-09-14 DIAGNOSIS — F418 Other specified anxiety disorders: Secondary | ICD-10-CM

## 2022-09-14 DIAGNOSIS — Z9189 Other specified personal risk factors, not elsewhere classified: Secondary | ICD-10-CM | POA: Diagnosis not present

## 2022-09-14 NOTE — Progress Notes (Unsigned)
Psychotherapy Progress Note [facility] [name], PhD LP  Patient ID: Jeremy [name] [facility])    MRN: [id number] Therapy format: Individual psychotherapy Date: 09/14/2022      Start: 3:18p     Stop: ***:***     Time Spent: *** min Location: In-person   Session narrative (presenting needs, interim history, self-report of stressors and symptoms, applications of prior therapy, status changes, and interventions made in session) Never started PT, claims the referral got stuck "in limbo" between Dr. Thomasena Edis' office and [name].  Has identified [name] instead.  Attempted to call PCP office to facilitate, but closed.  Says last week he got himself to walk some in the house.  Been trying to walk, in the house, for some sustained period, once a day, shooting for 30 min.  Has made some 10s and 20s. Last 2 days has tried to get up repeatedly for shorter, shooting for hourly 5 min.  Est 4x up yesterday, 6x the day before.  Feels his legs are related to his lumbar spinal problem, though pretty likely it's weakness from disuse.  Does have bulging L4-L5.  Pretty sure his old, trusted chiropractor has retired.    Therapeutic modalities: {AM:23362::"Cognitive Behavioral Therapy","Solution-Oriented/Positive Psychology"}  Mental Status/Observations:  Appearance:   {PSY:22683}     Behavior:  {PSY:21022743}  Motor:  {PSY:22302}  Speech/Language:   {PSY:22685}  Affect:  {PSY:22687}  Mood:  {PSY:31886}  Thought process:  {PSY:31888}  Thought content:    {PSY:[phone]}  Sensory/Perceptual disturbances:    {PSY:7606656199}  Orientation:  {Psych Orientation:23301::"Fully oriented"}  Attention:  {Good-Fair-Poor ratings:23770::"Good"}    Concentration:  {Good-Fair-Poor ratings:23770::"Good"}  Memory:  {[phone]}  Insight:    {Good-Fair-Poor ratings:23770::"Good"}  Judgment:   {Good-Fair-Poor ratings:23770::"Good"}  Impulse Control:  {Good-Fair-Poor  ratings:23770::"Good"}   Risk Assessment: Danger to Self: {Risk:22599::"No"} Self-injurious Behavior: {Risk:22599::"No"} Danger to Others: {Risk:22599::"No"} Physical Aggression / Violence: {Risk:22599::"No"} Duty to Warn: {AMYesNo:22526::"No"} Access to Firearms a concern: {AMYesNo:22526::"No"}  Assessment of progress:  {Progress:22147::"progressing"}  Diagnosis: No diagnosis found. Plan:  *** Other recommendations/advice as may be noted above Continue to utilize previously learned skills ad lib Maintain medication as prescribed and work faithfully with relevant prescriber(s) if any changes are desired or seem indicated Call the clinic on-call service, 988/hotline, 911, or present to [facility] or ER if any life-threatening psychiatric crisis No follow-ups on file. Already scheduled visit in this office 09/20/2022.  Robley Fries, PhD [name], PhD LP Clinical Psychologist, [facility] [address] [place], [place] [address] [phone]

## 2022-09-20 ENCOUNTER — Encounter: Payer: Self-pay | Admitting: Physician Assistant

## 2022-09-20 ENCOUNTER — Ambulatory Visit (INDEPENDENT_AMBULATORY_CARE_PROVIDER_SITE_OTHER): Payer: Medicare Other | Admitting: Physician Assistant

## 2022-09-20 VITALS — Wt 105.4 lb

## 2022-09-20 DIAGNOSIS — F322 Major depressive disorder, single episode, severe without psychotic features: Secondary | ICD-10-CM | POA: Diagnosis not present

## 2022-09-20 DIAGNOSIS — F411 Generalized anxiety disorder: Secondary | ICD-10-CM | POA: Diagnosis not present

## 2022-09-20 DIAGNOSIS — R64 Cachexia: Secondary | ICD-10-CM | POA: Diagnosis not present

## 2022-09-20 NOTE — Progress Notes (Signed)
[facility]  Patient ID: Jeremy [name],  MRN: [id number]  PCP: Irena Reichmann, DO  Date of Evaluation: 09/20/2022 Time spent:20 minutes  Chief Complaint:  Chief Complaint   Depression; Follow-up    HISTORY/CURRENT STATUS: HPI  For 6 week med check  At last OV, Mirtazapine was increased, hoping to help depression, sleep, and appetite. States he couldn't tolerate it. Had the 'opposite effect,' kept him awake and made him feel anxious. Tried it for about 2 weeks, then decreased to 30 mg. Sleeps a little better now.  Still depressed though and has 'bed-itis' where he stays in be all the time. Has been getting up, walking in the house maybe 5 minutes a couple of times a day. Doesn't go outside or do anything else. States he's binge eating, anything he can get his hands on. Wants sweets a lot. Energy and motivation are non-existent. ADLs are low. Personal hygiene is nl. Gets anxious, more overwhelmed than anything.  No PA.  No SI/HI.   Patient denies increased energy with decreased need for sleep, increased talkativeness, racing thoughts, impulsivity or risky behaviors, increased spending, increased libido, grandiosity, increased irritability or anger, paranoia, or hallucinations.  Denies dizziness, syncope, seizures, numbness, tingling, tremor, tics, unsteady gait, slurred speech, confusion. Denies frequent infections, or sores that heal slowly.  No polyphagia, polydipsia, or polyuria. Denies visual changes or paresthesias.  PCP follows his labs.  Individual Medical History/ Review of Systems: Changes? :No      Past medications for mental health diagnoses include: (None listed in old chart) Lithium he took only for a few weeks for severe depression with suicidal thoughts but did not like it so he stopped it.  cymbalta, mirtazapine, Ativan,   Was hospitalized many times since 1964, most recent was in 2005.   Allergies: Aspergillus allergy skin test, Aspirin, Sulfa antibiotics, and  Theophyllines  Current Medications:  Current Outpatient Medications:    Cholecalciferol (VITAMIN D) 50 MCG (2000 UT) CAPS, Take 2 capsules (4,000 Units total) by mouth daily., Disp: 60 capsule, Rfl:    clotrimazole-betamethasone (LOTRISONE) cream, APPLY TO AFFECTED AREA TWICE A DAY, Disp: 30 g, Rfl: 0   clotrimazole-betamethasone (LOTRISONE) cream, Apply 1 Application topically 2 (two) times daily., Disp: 30 g, Rfl: 0   DULoxetine (CYMBALTA) 60 MG capsule, Take 2 capsules (120 mg total) by mouth daily., Disp: 180 capsule, Rfl: 1   finasteride (PROSCAR) 5 MG tablet, Take 5 mg by mouth every morning., Disp: , Rfl:    mirtazapine (REMERON) 30 MG tablet, Take 1 tablet (30 mg total) by mouth at bedtime., Disp: 30 tablet, Rfl: 1   Omega-3 Fatty Acids (OMEGA 3 500 PO), Take by mouth., Disp: , Rfl:    risperiDONE (RISPERDAL) 3 MG tablet, Take 1 tablet (3 mg total) by mouth at bedtime., Disp: 90 tablet, Rfl: 1   tamsulosin (FLOMAX) 0.4 MG CAPS capsule, Take 0.4 mg by mouth every evening., Disp: , Rfl:    Turmeric (QC TUMERIC COMPLEX PO), Take by mouth., Disp: , Rfl:    ammonium lactate (AMLACTIN DAILY) 12 % lotion, Apply 1 Application topically as needed for dry skin. (Patient not taking: Reported on 06/21/2022), Disp: 400 g, Rfl: 0   b complex vitamins capsule, Take 1 capsule by mouth daily. (Patient not taking: Reported on 03/06/2022), Disp: , Rfl:    LORazepam (ATIVAN) 0.5 MG tablet, Take 1/2-1 tablet twice daily as needed for severe anxiety/panic (Patient not taking: Reported on 06/21/2022), Disp: 60 tablet, Rfl: 1   magnesium  30 MG tablet, Take 30 mg by mouth daily. (Patient not taking: Reported on 07/19/2021), Disp: , Rfl:  Medication Side Effects: none  Family Medical/ Social History: Changes? No  MENTAL HEALTH EXAM:  Weight 105 lb 6.4 oz (47.8 kg).Body mass index is 17.54 kg/m.  Approx 3# loss in 6 weeks  General Appearance: Casual, Well Groomed, and thin, cachetic, severe arthritic changes in  DIPs, MCPs in 1st, 2nd, 3rd, and 4th digits on hands bilat.   Eye Contact:  Good  Speech:  Clear and Coherent and Normal Rate  Volume:  Decreased  Mood:  Depressed and he smiled a few times when discussing plants. He hasn't smiled in my presence for a few years, that I remember.  Affect:  Depressed  Thought Process:  Goal Directed and Descriptions of Associations: Circumstantial  Orientation:  Full (Time, Place, and Person)  Thought Content: Logical   Suicidal Thoughts:  No  Homicidal Thoughts:  No  Memory:  WNL  Judgement:  Good  Insight:  Good  Psychomotor Activity:   walks slowly, no obvious tremor, decrease ROM and grip in hands bilat  Concentration:  Concentration: Good and Attention Span: Good  Recall:  Good  Fund of Knowledge: Good  Language: Good  Assets:  Special educational needs teacher  ADL's:  Intact  Cognition: WNL  Prognosis:  Poor   PCP follows labs  DIAGNOSES:    ICD-10-CM   1. Severe depression  F32.2     2. Generalized anxiety disorder  F41.1     3. Cachexia  R64       Receiving Psychotherapy: Yes Dr. Mardelle Matte Mitchum   RECOMMENDATIONS:  PDMP was reviewed. Ativan filled 04/26/2021. I provided 20 minutes of face to face time during this encounter, including time spent before and after the visit in records review, medical decision making, counseling pertinent to today's visit, and charting.   It's still very difficult to treat his depression b/c he doesn't want to make any changes. He doesn't want to risk possible SE. Recommend increasing Risperdal to 4 mg. He wants to think about it and will let me know. Otherwise, will stay on same dose until f/u. He's lost another 3 or so pounds in 6 weeks. He feels that reading can't be right, b/c he eats a lot, and some is junk food. At any rate, reminded him to eat more, especially proteins.   Continue vitamin D OTC 2000 IUs, 2 p.o. daily. Continue B complex. Continue  Cymbalta 60 mg, 2 p.o. every morning. Continue Mirtazapine 30 mg, 1 p.o. nightly prn. Continue risperidone 3 mg, 1 p.o. nightly. Continue therapy with Dr. [name].  Return in 6-8 weeks.  Melony Overly, PA-C

## 2022-09-25 DIAGNOSIS — M20032 Swan-neck deformity of left finger(s): Secondary | ICD-10-CM | POA: Diagnosis not present

## 2022-09-25 DIAGNOSIS — M20031 Swan-neck deformity of right finger(s): Secondary | ICD-10-CM | POA: Diagnosis not present

## 2022-09-27 DIAGNOSIS — R634 Abnormal weight loss: Secondary | ICD-10-CM | POA: Diagnosis not present

## 2022-09-27 DIAGNOSIS — M1990 Unspecified osteoarthritis, unspecified site: Secondary | ICD-10-CM | POA: Diagnosis not present

## 2022-09-27 DIAGNOSIS — M21949 Unspecified acquired deformity of hand, unspecified hand: Secondary | ICD-10-CM | POA: Diagnosis not present

## 2022-09-27 DIAGNOSIS — F329 Major depressive disorder, single episode, unspecified: Secondary | ICD-10-CM | POA: Diagnosis not present

## 2022-10-01 ENCOUNTER — Ambulatory Visit (INDEPENDENT_AMBULATORY_CARE_PROVIDER_SITE_OTHER): Payer: Medicare Other | Admitting: Psychiatry

## 2022-10-01 DIAGNOSIS — F418 Other specified anxiety disorders: Secondary | ICD-10-CM | POA: Diagnosis not present

## 2022-10-01 DIAGNOSIS — R64 Cachexia: Secondary | ICD-10-CM

## 2022-10-01 DIAGNOSIS — F411 Generalized anxiety disorder: Secondary | ICD-10-CM | POA: Diagnosis not present

## 2022-10-01 DIAGNOSIS — Z8659 Personal history of other mental and behavioral disorders: Secondary | ICD-10-CM | POA: Diagnosis not present

## 2022-10-01 DIAGNOSIS — Z9189 Other specified personal risk factors, not elsewhere classified: Secondary | ICD-10-CM

## 2022-10-01 DIAGNOSIS — F322 Major depressive disorder, single episode, severe without psychotic features: Secondary | ICD-10-CM

## 2022-10-01 NOTE — Progress Notes (Signed)
Psychotherapy Progress Note [facility] [name], PhD LP  Patient ID: Jeremy [name] [facility])    MRN: [id number] Therapy format: Family therapy w/ patient -- accompanied by Jeremy [name] Date: 10/01/2022      Start: 2:10p     Stop: 3:05p     Time Spent: 55 min Location: In-person   Session narrative (presenting needs, interim history, self-report of stressors and symptoms, applications of prior therapy, status changes, and interventions made in session) Spilled tea on floor, apologetic.  Got his referral to [name], and hand specialist dx'd classic RA ("neck of the swan" position) and referred to rheumatologist at [name]  Will see a hand OT, tomorrow, at [name].  Meanwhile, says [name]ology is "hemming and hawing", "not sure", and gave him a systemic steroid injx (prednisone, per [name]), 4 days ago.  Hand specialist said the negative RA test in January doesn't mean much, since blood factors often lag sxs.  [name]) said to be checking with a colleague about an alternative dx.  Waiting on PT with [name] for other needs.  Credit to [name] for pushing ahead, and to [name] for letting it progress.    Reveals previous depression has been about [name] being "too occupied elsewhere", affirming that he has felt angry then.  Now says she maintains relationships with the kids and with his sister, but he says little, just acknowledges hello and says good to hear from them.  Clarified whether angry with [name] now, and continued to confront practicing depression by staying in bed long hours and allowing the physical withering and drawing he has been experiencing.  Encouraged again to move, stretch, improve up time and carry through PT as planned.  If unable, he may require admission to psychiatric rehab, e.g., at [name].  For now, though, coached in representing his depression to medical and therapy personnel and allowing it to be part of his work  there.  Therapeutic modalities: Cognitive Behavioral Therapy and Solution-Oriented/Positive Psychology  Mental Status/Observations:  Appearance:   Neat and pale, thin      Behavior:  Rigid and Rationalizing  Motor:  Shuffling Gait and stiff  Speech/Language:   Clear and Coherent and hoarse  Affect:  Constricted  Mood:  depressed  Thought process:  normal  Thought content:    Ilusions, Rumination, and rationalization  Sensory/Perceptual disturbances:    grossly intact  Orientation:  grossly intact  Attention:  Good    Concentration:  Good  Memory:  grossly intact  Insight:    Fair  Judgment:   Fair  Impulse Control:  Fair   Risk Assessment: Danger to Self: No Self-injurious Behavior:  by virtue of self-neglect Danger to Others: No Physical Aggression / Violence: No Duty to Warn: No Access to Firearms a concern: No  Assessment of progress:  stabilized  Diagnosis:   ICD-10-CM   1. Severe depression (HCC)  F32.2     2. Generalized anxiety disorder  F41.1     3. Cachexia (HCC)  R64     4. Anxiety with somatic features  F41.8     5. Potential for self care deficit  Z91.89     6. History of psychosis  Z86.59      Plan:  Physical activation -- Stretch, walk, even if it is only once down the driveway and back once, and break up long times on the bed.  Endorse frequent, short walks in the house or out to break up malaise.  Engage physical therapy ASAP to address back pain, psychosomatic hand immobilization, overall flexibility and stamina.   Social activation and management -- Endorse wife being more assertive, "Nurse [name]" as she calls it, and recruiting persistent friend [name] to visit and move with [name].  Failing that, consider hiring an in-home companion. Nutrition -- Fully adopt vitamin supplementation as recommended to ensure availability of B complex, D, protein, and antiinflammatory/neuroprotective elements like omega 3 and turmeric.  Improve hydration by monitoring  intake, maybe using a 32 oz or larger bottle, with straw if desired, for visible measurement in a day.  Assume patient's estimation is unreliable. Bowels -- May stick with Miralax but seek fluids, fiber, and more movement to recover natural bowel control Potential placement or auxiliary services -- Consider trigger conditions to engage home health and home-based PT.  Consider referral to psychiatric hospital (geri program, e.g., [place]) or rehab to address intractable self-care deficits and wasting. Health care side issues -- Reconcile PCP and podiatry treatment strategies for what is thought to be widespread fungal infection, but may have more to do with bed time and lack of movement. Other recommendations/advice as may be noted above Continue to utilize previously learned skills ad lib Maintain medication as prescribed and work faithfully with relevant prescriber(s) if any changes are desired or seem indicated Call the clinic on-call service, 988/hotline, 911, or present to [facility] or ER if any life-threatening psychiatric crisis Return in about 2 weeks (around 10/15/2022), or preferably. Already scheduled visit in this office 10/30/2022.  Robley Fries, PhD [name], PhD LP Clinical Psychologist, [facility] [address] [place], [place] [address] [phone]

## 2022-10-02 DIAGNOSIS — M79641 Pain in right hand: Secondary | ICD-10-CM | POA: Diagnosis not present

## 2022-10-05 DIAGNOSIS — M79641 Pain in right hand: Secondary | ICD-10-CM | POA: Diagnosis not present

## 2022-10-09 DIAGNOSIS — M79641 Pain in right hand: Secondary | ICD-10-CM | POA: Diagnosis not present

## 2022-10-12 DIAGNOSIS — M79641 Pain in right hand: Secondary | ICD-10-CM | POA: Diagnosis not present

## 2022-10-16 DIAGNOSIS — M79641 Pain in right hand: Secondary | ICD-10-CM | POA: Diagnosis not present

## 2022-10-23 DIAGNOSIS — M79641 Pain in right hand: Secondary | ICD-10-CM | POA: Diagnosis not present

## 2022-10-30 ENCOUNTER — Ambulatory Visit (INDEPENDENT_AMBULATORY_CARE_PROVIDER_SITE_OTHER): Payer: Medicare Other | Admitting: Psychiatry

## 2022-10-30 DIAGNOSIS — Z9189 Other specified personal risk factors, not elsewhere classified: Secondary | ICD-10-CM | POA: Diagnosis not present

## 2022-10-30 DIAGNOSIS — F411 Generalized anxiety disorder: Secondary | ICD-10-CM

## 2022-10-30 DIAGNOSIS — R64 Cachexia: Secondary | ICD-10-CM

## 2022-10-30 DIAGNOSIS — F332 Major depressive disorder, recurrent severe without psychotic features: Secondary | ICD-10-CM

## 2022-10-30 DIAGNOSIS — Z8659 Personal history of other mental and behavioral disorders: Secondary | ICD-10-CM

## 2022-10-30 DIAGNOSIS — F418 Other specified anxiety disorders: Secondary | ICD-10-CM | POA: Diagnosis not present

## 2022-10-30 NOTE — Progress Notes (Signed)
Psychotherapy Progress Note [facility] Jeremy Czar, PhD LP  Patient ID: Jeremy [name] [name])    MRN: [id number] Therapy format: Family therapy w/ patient -- accompanied by Jeremy [name], part time Date: 10/30/2022      Start: 2:05p     Stop: 2:55p     Time Spent: 50 min Location: In-person   Session narrative (presenting needs, interim history, self-report of stressors and symptoms, applications of prior therapy, status changes, and interventions made in session) Acknowledges he'd rather be dead, sees himself as a "#1 asshole" now.  45th anniversary recently, and h alleges Jeremy [name] old grievances, including his last grave depression about 20 yrs ago, when he was angry with her, and how he was hard on Jeremy [name] in her middle school years.  Vague, himself, but does recall hx marriage counseling where he felt she was too preoccupied with work and she basically said it's his problem.  Reviewed fall 2022, when Jeremy [name] finished and he meant to start training for swimming but didn't.  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  Has felt that she continued hard to persuade to spend time with him, and it is an old issue in their relationship.  A couple weeks back, she went to [place] [name], traveling solo to see their new grandson.  2nd time now she's traveled without him, though he owns that he didn't feel up to the trip, and he did not in any way forbid her, nor ask her to stay.  Confronted tendency to read in attitudes and motives as a "trick" of depression, encouraged to perception-check these things.  Physically, has been making it regularly to PT for his hands.  Says he's told it's going well, but he doesn't see it.  Using exercises for flexibility and range of movement, and splints.  Has not acted on the other PT needs (back pain, deconditioning), but asserts he will.  Continued to encourage as a physiological  intervention for depression.  Confronted his apparent wish to die, as well, which he acknowledges but also admits is not resolved to die, and still interested in feeling better.  Supportively confronted he will have to do things that are uncomfortable to have that, but it does pay off. Too.  He is not fundamentally as sick as he feels, because he is somaticizing his anxiety, anger, and depression.  Offers renewed for PT to be in touch, and for him to be referred to geriatric inpatient treatment if it looks like he and Jeremy [name] cannot mount a recovery.  Consulted separately, Jeremy [name] is surprised to learn that Jeremy [name]'s felt like maybe daughter Jeremy [name] was put out with him.  No indications of it to her, suspects it is projected, though she did become pushy with him over a year ago when he was sinking into depression.  Willing to perception-check with Jeremy [name] and continue positive pressure to Jeremy [name] to carry out PT and check his own depressive projections.  Therapeutic modalities: Cognitive Behavioral Therapy, Solution-Oriented/Positive Psychology, Environmental manager, and Motivational Interviewing  Mental Status/Observations:  Appearance:   Casual, Neat, and thin, pale      Behavior:  Rigid  Motor:  rigid  Speech/Language:   Clear and Coherent and husky  Affect:  Constricted  Mood:  depressed  Thought process:  Generally intact, but rigid  Thought content:    Rumination  Sensory/Perceptual disturbances:    Pain noted  Orientation:  grossly intact  Attention:  Good    Concentration:  Good  Memory:  grossly intact  Insight:    Fair  Judgment:   Fair  Impulse Control:  Fair   Risk Assessment: Danger to Self: No Self-injurious Behavior:  tendency to self-neglect Danger to Others: No Physical Aggression / Violence: No Duty to Warn: No Access to Firearms a concern: No  Assessment of progress:  progressing, gradually  Diagnosis:   ICD-10-CM   1. Severe episode of recurrent major depressive disorder, without  psychotic features (HCC)  F33.2     2. Generalized anxiety disorder  F41.1     3. Cachexia (HCC)  R64     4. Anxiety with somatic features  F41.8     5. Potential for self care deficit  Z91.89     6. History of psychosis  Z86.59      Plan:  Physical activation -- Stretch, walk, even if it is only once down the driveway and back once, and break up long times on the bed.  Endorse frequent, short walks in the house or out to break up malaise.  Fully engage physical therapy to address back pain, overall flexibility and stamina, as well as psychosomatic hand immobilization.   Social activation and management -- Endorse wife being more assertive, "Nurse Jeremy [name]" as she calls it, and recruiting persistent friend Jeremy [name] to visit and move with Jeremy [name].  Failing that, consider hiring an in-home companion. Nutrition -- Fully adopt vitamin supplementation as recommended to ensure availability of B complex, D, protein, and antiinflammatory/neuroprotective elements like omega 3 and turmeric.  Improve hydration by monitoring intake, maybe using a 32 oz or larger bottle, with straw if desired, for visible measurement in a day.  Assume patient's estimation is unreliable, need wife to assess. Bowels -- May stick with temperate dosing of Miralax but seek fluids, fiber, and more movement to recover natural bowel control Potential placement or auxiliary services -- Consider criteria and trigger conditions to engage home Jeremy and home-based PT.  Consider referral to psychiatric [name] (geri program, e.g., [name]) or rehab to address intractable self-care deficits and wasting. Jeremy care side issues -- Reconcile PCP and specialist therapies for what is thought to be a widespread fungal infection.  May have more to do with long hours immobile in bed. Other recommendations/advice as may be noted above Continue to utilize previously learned skills ad lib Maintain medication as prescribed and work faithfully with  relevant prescriber(s) if any changes are desired or seem indicated Call the clinic on-call service, 988/hotline, 911, or present to [facility] or ER if any life-threatening psychiatric crisis Return 2-4wks, for recommend sched ahead. Already scheduled visit in this office 11/16/2022.  Robley Fries, PhD Jeremy Czar, PhD LP Clinical Psychologist, [name] [address], [address] [place], [place] [address] [phone]

## 2022-11-05 DIAGNOSIS — M79641 Pain in right hand: Secondary | ICD-10-CM | POA: Diagnosis not present

## 2022-11-16 ENCOUNTER — Encounter: Payer: Self-pay | Admitting: Physician Assistant

## 2022-11-16 ENCOUNTER — Ambulatory Visit (INDEPENDENT_AMBULATORY_CARE_PROVIDER_SITE_OTHER): Payer: Medicare Other | Admitting: Physician Assistant

## 2022-11-16 VITALS — Wt 99.2 lb

## 2022-11-16 DIAGNOSIS — Z8659 Personal history of other mental and behavioral disorders: Secondary | ICD-10-CM | POA: Diagnosis not present

## 2022-11-16 DIAGNOSIS — F411 Generalized anxiety disorder: Secondary | ICD-10-CM

## 2022-11-16 DIAGNOSIS — R64 Cachexia: Secondary | ICD-10-CM

## 2022-11-16 DIAGNOSIS — R634 Abnormal weight loss: Secondary | ICD-10-CM

## 2022-11-16 DIAGNOSIS — R630 Anorexia: Secondary | ICD-10-CM | POA: Diagnosis not present

## 2022-11-16 DIAGNOSIS — F322 Major depressive disorder, single episode, severe without psychotic features: Secondary | ICD-10-CM

## 2022-11-16 NOTE — Progress Notes (Unsigned)
[facility]  Patient ID: Jeremy [name],  MRN: [id number]  PCP: Irena Reichmann, DO  Date of Evaluation: 11/16/2022 Time spent:30 minutes  Chief Complaint:  Chief Complaint   Depression; Follow-up    HISTORY/CURRENT STATUS: HPI  For routine med check.   Jeremy [name] is still very depressed and is losing weight.  He states he is eating but does not tell me how much or what he is eating.  He is now in physical therapy for the contractures in his hands.  Has not seen much difference yet.  He has a hard time brushing his teeth due to contractures.  Has no problems using an eating utensil.  He stays in bed probably 90% of the time.  Personal hygiene is normal.  Energy is very low.  Again states he has given up.  Has no hope, nothing to look forward to.  He does not cry easily.  Denies anxiety.  No suicidal thoughts but he does not care if he does not wake up 1 day.  No homicidal thoughts.  Patient denies increased energy with decreased need for sleep, increased talkativeness, racing thoughts, impulsivity or risky behaviors, increased spending, increased libido, grandiosity, increased irritability or anger, paranoia, or hallucinations.  Review of Systems  Constitutional:  Positive for malaise/fatigue and weight loss.  HENT: Negative.    Eyes: Negative.   Respiratory: Negative.    Cardiovascular: Negative.   Gastrointestinal: Negative.   Genitourinary: Negative.   Musculoskeletal:  Positive for joint pain.       Painful DIP and MCP particularly in 2,3,4 digits bilat, difficulty bending the fingers at PCP and DIPs in all 4 fingers  Skin: Negative.   Neurological: Negative.   Endo/Heme/Allergies: Negative.   Psychiatric/Behavioral:         See HPI   Individual Medical History/ Review of Systems: Changes? :No      Past medications for mental health diagnoses include: (None listed in old chart) Lithium he took only for a few weeks for severe depression with suicidal thoughts but did not  like it so he stopped it.  cymbalta, mirtazapine, Ativan,   Was hospitalized many times since 1964, most recent was in 2005.   Allergies: Aspergillus allergy skin test, Aspirin, Sulfa antibiotics, and Theophyllines  Current Medications:  Current Outpatient Medications:    b complex vitamins capsule, Take 1 capsule by mouth daily., Disp: , Rfl:    Cholecalciferol (VITAMIN D) 50 MCG (2000 UT) CAPS, Take 2 capsules (4,000 Units total) by mouth daily., Disp: 60 capsule, Rfl:    clotrimazole-betamethasone (LOTRISONE) cream, APPLY TO AFFECTED AREA TWICE A DAY, Disp: 30 g, Rfl: 0   clotrimazole-betamethasone (LOTRISONE) cream, Apply 1 Application topically 2 (two) times daily., Disp: 30 g, Rfl: 0   DULoxetine (CYMBALTA) 60 MG capsule, Take 2 capsules (120 mg total) by mouth daily., Disp: 180 capsule, Rfl: 1   finasteride (PROSCAR) 5 MG tablet, Take 5 mg by mouth every morning., Disp: , Rfl:    mirtazapine (REMERON) 30 MG tablet, Take 1 tablet (30 mg total) by mouth at bedtime., Disp: 30 tablet, Rfl: 1   Omega-3 Fatty Acids (OMEGA 3 500 PO), Take by mouth., Disp: , Rfl:    risperiDONE (RISPERDAL) 3 MG tablet, Take 1 tablet (3 mg total) by mouth at bedtime., Disp: 90 tablet, Rfl: 1   tamsulosin (FLOMAX) 0.4 MG CAPS capsule, Take 0.4 mg by mouth every evening., Disp: , Rfl:    Turmeric (QC TUMERIC COMPLEX PO), Take by mouth., Disp: ,  Rfl:    ammonium lactate (AMLACTIN DAILY) 12 % lotion, Apply 1 Application topically as needed for dry skin. (Patient not taking: Reported on 06/21/2022), Disp: 400 g, Rfl: 0   LORazepam (ATIVAN) 0.5 MG tablet, Take 1/2-1 tablet twice daily as needed for severe anxiety/panic (Patient not taking: Reported on 06/21/2022), Disp: 60 tablet, Rfl: 1   magnesium 30 MG tablet, Take 30 mg by mouth daily. (Patient not taking: Reported on 07/19/2021), Disp: , Rfl:  Medication Side Effects: none  Family Medical/ Social History: Changes? No  MENTAL HEALTH EXAM:  Weight 99 lb 3.2 oz  (45 kg).Body mass index is 16.51 kg/m.  6# loss in 2 months  General Appearance: Casual, Well Groomed, and thin, cachetic, severe arthritic changes in DIPs, MCPs in 1st, 2nd, 3rd, and 4th digits on hands bilat with contractures   Eye Contact:  Good  Speech:  Clear and Coherent and Normal Rate  Volume:  Decreased  Mood:  Depressed  Affect:  Depressed and Flat  Thought Process:  Goal Directed and Descriptions of Associations: Circumstantial  Orientation:  Full (Time, Place, and Person)  Thought Content: Logical   Suicidal Thoughts:  No  Homicidal Thoughts:  No  Memory:  WNL  Judgement:  Good  Insight:  Good  Psychomotor Activity:  Decreased and mild left hand tremor at rest  Concentration:  Concentration: Good and Attention Span: Good  Recall:  Good  Fund of Knowledge: Good  Language: Good  Assets:  Special educational needs teacher  ADL's:  Intact  Cognition: WNL  Prognosis:  Poor   PCP follows labs  Most recent Labs that I have access to at this time.  06/08/2022 B12 normal, CBC with differential, hemoglobin 12.5, hematocrit 35.2 kidney and liver function are normal, glucose 102, hemoglobin A1c 5.2, lipid panel total cholesterol 290, triglycerides 67, HDL 99 and LDL 181, vitamin D 28.5, TSH 3.7  DIAGNOSES:    ICD-10-CM   1. Severe depression (HCC)  F32.2     2. Cachexia (HCC)  R64     3. Generalized anxiety disorder  F41.1     4. Decreased appetite  R63.0     5. Loss of weight  R63.4     6. History of psychosis  Z86.59      Receiving Psychotherapy: Yes Dr. Mardelle Matte Mitchum   RECOMMENDATIONS:  PDMP was reviewed. Ativan filled 04/26/2021. I provided 30 minutes of face to face time during this encounter, including time spent before and after the visit in records review, medical decision making, counseling pertinent to today's visit, and charting.   Hakiem understands that he is not doing well at all mentally which is causing decreased  physical health as well.  He understands that staying in bed all the time is causing muscle weakness and I think contractures in his hands bilaterally, puts him at higher risk for constipation, decubiti, pneumonia, and other physical issues, including death.  He is not getting proper nutrition and continues to lose weight which can also lead to the above issues. Discussed diet. Needs to eat high calorie food, as nutritious as possible like several veggies or fruits per day, protein daily, but also pasta, bread, ice cream, Boost or Ensure.  We again discussed medication changes.  Clearly the Cymbalta is not helping and has not for at least 1.5 years.  Up until the fall 2022 he was very active, had a large flower garden that neighbors would go by his house to see.  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  After that time, his mental health quickly declined.  I prescribed lithium early on in this episode of severe depression, but he only took it for a few weeks and did not like it, refused to take it anymore.  He just "did not want to take it."  Abilify was given also early on, he did not like that either.  He is fixated on continuing Cymbalta and Risperdal and refuses to add anything else or change altogether.  He saw GI early last year concerning severe constipation and the weight loss, and has followed up with his PCP at [facility], I am unable to see their records.  He has refused any medication changes for many months now, knowing that his current treatment is not beneficial.  I will discuss his condition with Dr. Meredith Staggers to seek further advice.  At this point however, there will be no change.  Continue Cymbalta 60 mg, 2 p.o. daily. Continue mirtazapine 30 mg, 1 p.o. nightly. Continue risperidone 3 mg, 1 p.o. nightly. Continue multivitamin, vitamin D, B complex, and fish oil. Continue therapy with Dr.  [name]. Return in 2 months.  Melony Overly, PA-C

## 2022-11-22 ENCOUNTER — Other Ambulatory Visit: Payer: Self-pay | Admitting: Physician Assistant

## 2022-11-22 ENCOUNTER — Encounter: Payer: Self-pay | Admitting: Physician Assistant

## 2022-11-22 DIAGNOSIS — M79641 Pain in right hand: Secondary | ICD-10-CM | POA: Diagnosis not present

## 2022-11-26 ENCOUNTER — Telehealth: Payer: Self-pay | Admitting: Physician Assistant

## 2022-11-26 NOTE — Telephone Encounter (Signed)
Please see message. °

## 2022-11-26 NOTE — Telephone Encounter (Signed)
Pt called stating Rosey Bath left message to speak with him & wife. Message left Thursday of Friday. Contact # [phone].

## 2022-11-27 NOTE — Telephone Encounter (Signed)
Tried to call patient.  I got his answering machine.  I left a message that I would call back tomorrow.

## 2022-11-29 ENCOUNTER — Other Ambulatory Visit: Payer: Self-pay | Admitting: Physician Assistant

## 2022-11-29 ENCOUNTER — Ambulatory Visit (INDEPENDENT_AMBULATORY_CARE_PROVIDER_SITE_OTHER): Payer: Medicare Other | Admitting: Psychiatry

## 2022-11-29 DIAGNOSIS — Z8659 Personal history of other mental and behavioral disorders: Secondary | ICD-10-CM

## 2022-11-29 DIAGNOSIS — R64 Cachexia: Secondary | ICD-10-CM

## 2022-11-29 DIAGNOSIS — Z9189 Other specified personal risk factors, not elsewhere classified: Secondary | ICD-10-CM

## 2022-11-29 DIAGNOSIS — F332 Major depressive disorder, recurrent severe without psychotic features: Secondary | ICD-10-CM | POA: Diagnosis not present

## 2022-11-29 MED ORDER — OLANZAPINE 2.5 MG PO TABS: 2.5000 mg | ORAL_TABLET | Freq: Every day | ORAL | 1 refills | Status: DC

## 2022-11-29 NOTE — Progress Notes (Signed)
Psychotherapy Progress Note [facility] [name], PhD LP  Patient ID: Nazeer [facility])    MRN: [id number] Therapy format: Individual psychotherapy, with family involvement 6/27 & 6/28 Date: 11/29/2022      Start: 2:11p     Stop: 2:58p     Time Spent: 47 min Location: In-person   Session narrative (presenting needs, interim history, self-report of stressors and symptoms, applications of prior therapy, status changes, and interventions made in session) Still afflicted with "bed-itis", staying long hours in bed.  Went through a lull in doing his hand exercises but back on them.    Discussed times when [place] travelled.  Originally was scared what would happen alone, whether he would go into a psychosis, but he didn't.  Says he pigged out while she was away, but that is in no way evident and likely a great distortion.  Affirms that he hasn't had any delusions throughout this 2-year depression, it's all about feeling intractably down, demotivated, guilty, sometimes resentful, and convinced effortful living is no use.  Re. weight, has seen it drop, associated with dropping ice cream.  Still on Miralax, 1 dose daily.  Claims to be getting vegetables, rice, salads, protein from eggs and smoothies made from a list that supposedly provides complete protein.  Not sure if it has any particular fat sources.  Admittedly irregular about B vitamins.  Firmly recommend more fat content, and consider putting pills into the smoothie to make them more palatable.  Recommendations shared with [name].  Therapeutic modalities: Cognitive Behavioral Therapy, Solution-Oriented/Positive Psychology, Ego-Supportive, and Psycho-education/Bibliotherapy  Mental Status/Observations:  Appearance:   Casual and Neat     Behavior:  Resistant  Motor:  stiff  Speech/Language:   Clear and Coherent  Affect:  Appropriate and Constricted  Mood:  depressed  Thought process:  normal  Thought content:     Rumination  Sensory/Perceptual disturbances:    WNL, r/o somatic delusions  Orientation:  Fully oriented  Attention:  Good    Concentration:  Good  Memory:  grossly intact  Insight:    Fair  Judgment:   Fair  Impulse Control:  Fair   Risk Assessment: Danger to Self: No Self-injurious Behavior: No Danger to Others: No Physical Aggression / Violence: No Duty to Warn: No Access to Firearms a concern: No  Assessment of progress:  stabilized  Diagnosis:   ICD-10-CM   1. 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 History of psychosis  Z86.59     3. Cachexia (HCC)  R64     4. Potential for self care deficit  Z91.89      Plan:  Physical activation -- Stretch, walk, even if it is only once down the driveway and back once, and break up long times on the bed.  Endorse frequent, short walks in the house or out to break up malaise.  Fully engage physical therapy to address back pain, overall flexibility and stamina, as well as psychosomatic hand immobilization.   Social activation and management -- Endorse wife being more assertive, "Nurse [name]" as she calls it, and recruiting persistent friend [name] to visit and move with [name].  Failing that, consider hiring an in-home companion. Nutrition -- Fully adopt vitamin supplementation as recommended to ensure availability of B complex, D, protein, and antiinflammatory/neuroprotective elements like omega 3 and turmeric.  Improve hydration by monitoring intake, maybe using a 32 oz or larger bottle,  with straw if desired, for visible measurement in a day.  Assume patient's estimation is unreliable, need wife to assess. Bowels -- May stick with temperate dosing of Miralax but seek fluids, fiber, and more movement to recover natural bowel control Potential placement or auxiliary services -- Consider criteria and trigger conditions to engage home health and home-based PT.   Consider referral to psychiatric hospital (geri program, e.g., [place]) or rehab to address intractable self-care deficits and wasting. Health care side issues -- Reconcile PCP and specialist therapies for what is thought to be a widespread fungal infection.  May have more to do with long hours immobile in bed. Coordination of care -- Reserve option to  Other recommendations/advice -- As may be noted above.  Continue to utilize previously learned skills ad lib. Medication compliance -- Maintain medication as prescribed and work faithfully with relevant prescriber(s) if any changes are desired or seem indicated. Crisis service -- Aware of call list and work-in appts.  Call the clinic on-call service, 988/hotline, 911, or present to [facility] or ER if any life-threatening psychiatric crisis. Followup -- Return for time as already scheduled, avail earlier @ PT's need.  Next scheduled visit with me 01/01/2023.  Next scheduled in this office 01/01/2023.  Addendum: Joined with Pt, W, and PA on 6/28 to collect around a unified perspective on treatment and ensure wife is sufficiently aware.  Se PA's phone note (6/24).  Of note, [name] was not aware of how badly nutrition is going, nor how much time he actually spends in bed, and the subject of potential hospitalization was broached more frankly.  Handwritten notes made in session about what steps need her and what steps she can take to help motivate appropriate self-care.  Hopeful about antipsychotic change.  Robley Fries, PhD [name], PhD LP Clinical Psychologist, [facility] [address], [address] [place], [place] [address] [phone]

## 2022-11-30 NOTE — Telephone Encounter (Signed)
Patient was here yesterday afternoon for an appointment with Dr. Farrel Demark.  I spoke with him, his wife [name], and Dr. Farrel Demark, concerning my discussion with Dr. Jennelle Human concerning his medications.  One option would be to change Risperdal to Zyprexa.  This would help with depression, appetite, and anxiety.  Another option would be to change Cymbalta to Trintellix.  Another option is to discontinue Risperdal, use Ritalin 5 mg 3 times daily.  Another choice would be adding pramipexole for treatment resistant depression.  Avoid TCAs because of his age and if we do not choose it in the future he needs an EKG and levels drawn.  I did not discuss all of these options with Cyric and his wife, I feel like at this time the best choice would be to change Risperdal to Zyprexa, and giving too many options at once would be overwhelming to [place].  Benefits, risks and side effects were explained.  He agreed to change Risperdal to Zyprexa.  Prescription was sent in.  He has an appointment with me in about 6 weeks and we will follow up then.  Of course if he has any problems before then, call.

## 2022-12-05 DIAGNOSIS — M79641 Pain in right hand: Secondary | ICD-10-CM | POA: Diagnosis not present

## 2022-12-22 ENCOUNTER — Other Ambulatory Visit: Payer: Self-pay | Admitting: Physician Assistant

## 2022-12-24 ENCOUNTER — Other Ambulatory Visit (HOSPITAL_BASED_OUTPATIENT_CLINIC_OR_DEPARTMENT_OTHER): Payer: Self-pay

## 2022-12-24 ENCOUNTER — Other Ambulatory Visit: Payer: Self-pay

## 2022-12-24 ENCOUNTER — Ambulatory Visit
Admission: EM | Admit: 2022-12-24 | Discharge: 2022-12-24 | Disposition: A | Discharge status: Home / Self Care | Payer: Medicare Other

## 2022-12-24 ENCOUNTER — Encounter (HOSPITAL_BASED_OUTPATIENT_CLINIC_OR_DEPARTMENT_OTHER): Payer: Self-pay

## 2022-12-24 ENCOUNTER — Emergency Department (HOSPITAL_BASED_OUTPATIENT_CLINIC_OR_DEPARTMENT_OTHER)
Admission: EM | Admit: 2022-12-24 | Discharge: 2022-12-24 | Disposition: A | Discharge status: Home / Self Care | Payer: Medicare Other | Source: Home / Self Care | Attending: Emergency Medicine | Admitting: Emergency Medicine

## 2022-12-24 DIAGNOSIS — W010XXA Fall on same level from slipping, tripping and stumbling without subsequent striking against object, initial encounter: Secondary | ICD-10-CM | POA: Insufficient documentation

## 2022-12-24 DIAGNOSIS — Z23 Encounter for immunization: Secondary | ICD-10-CM | POA: Diagnosis not present

## 2022-12-24 DIAGNOSIS — S01411A Laceration without foreign body of right cheek and temporomandibular area, initial encounter: Secondary | ICD-10-CM | POA: Diagnosis not present

## 2022-12-24 DIAGNOSIS — S0083XA Contusion of other part of head, initial encounter: Secondary | ICD-10-CM

## 2022-12-24 DIAGNOSIS — S0993XA Unspecified injury of face, initial encounter: Secondary | ICD-10-CM | POA: Diagnosis present

## 2022-12-24 DIAGNOSIS — S0181XA Laceration without foreign body of other part of head, initial encounter: Secondary | ICD-10-CM | POA: Diagnosis not present

## 2022-12-24 MED ORDER — ACETAMINOPHEN 325 MG PO TABS
650.0000 mg | ORAL_TABLET | Freq: Once | ORAL | Status: AC
  Administered 2022-12-24: 650 mg via ORAL
  Filled 2022-12-24: qty 2

## 2022-12-24 MED ORDER — LIDOCAINE-EPINEPHRINE (PF) 2 %-1:200000 IJ SOLN
20.0000 mL | Freq: Once | INTRAMUSCULAR | Status: AC
  Administered 2022-12-24: 20 mL
  Filled 2022-12-24: qty 20

## 2022-12-24 MED ORDER — TETANUS-DIPHTH-ACELL PERTUSSIS 5-2.5-18.5 LF-MCG/0.5 IM SUSY
0.5000 mL | PREFILLED_SYRINGE | Freq: Once | INTRAMUSCULAR | Status: AC
  Administered 2022-12-24: 0.5 mL via INTRAMUSCULAR
  Filled 2022-12-24: qty 0.5

## 2022-12-24 NOTE — ED Provider Notes (Signed)
[place] EMERGENCY DEPARTMENT AT [facility] Provider Note   CSN: [id number] Arrival date & time: 12/24/22  1114     History  Chief Complaint  Patient presents with   Jeremy [name] is a 87 y.o. male.  Patient s/p fall with contusion/laceration to right cheek. States he slipped causing self to fall forward. Denies faintness or dizziness prior to fall. No loc w fall. No anticoagulant use. No headache. No neck or back pain. No nausea/vomiting. Unsure of last tetanus. Denies other pain or injury. No chest pain or sob. No abd pain or nv. No other extremity pain or injury.   The history is provided by the patient.  Fall Pertinent negatives include no chest pain, no abdominal pain, no headaches and no shortness of breath.       Home Medications Prior to Admission medications   Medication Sig Start Date End Date Taking? Authorizing Provider  ammonium lactate (AMLACTIN DAILY) 12 % lotion Apply 1 Application topically as needed for dry skin. Patient not taking: Reported on 06/21/2022 06/20/22   Candelaria Stagers, DPM  b complex vitamins capsule Take 1 capsule by mouth daily.    [provider]  Cholecalciferol (VITAMIN D) 50 MCG (2000 UT) CAPS Take 2 capsules (4,000 Units total) by mouth daily. 06/21/22   Melony Overly T, PA-C  clotrimazole-betamethasone (LOTRISONE) cream APPLY TO AFFECTED AREA TWICE A DAY 05/29/22   Candelaria Stagers, DPM  clotrimazole-betamethasone (LOTRISONE) cream Apply 1 Application topically 2 (two) times daily. 06/20/22   Candelaria Stagers, DPM  DULoxetine (CYMBALTA) 60 MG capsule Take 2 capsules (120 mg total) by mouth daily. 08/09/22   Melony Overly T, PA-C  finasteride (PROSCAR) 5 MG tablet Take 5 mg by mouth every morning. 01/10/19   [provider]  LORazepam (ATIVAN) 0.5 MG tablet Take 1/2-1 tablet twice daily as needed for severe anxiety/panic Patient not taking: Reported on 06/21/2022 04/26/21   Melony Overly T, PA-C  magnesium 30 MG tablet  Take 30 mg by mouth daily. Patient not taking: Reported on 07/19/2021    [provider]  mirtazapine (REMERON) 30 MG tablet TAKE 1 TABLET BY MOUTH AT BEDTIME. 11/23/22   Melony Overly T, PA-C  OLANZapine (ZYPREXA) 2.5 MG tablet TAKE 1 TABLET BY MOUTH AT BEDTIME. 12/23/22   Hurst, Rosey Bath T, PA-C  Omega-3 Fatty Acids (OMEGA 3 500 PO) Take by mouth.    [provider]  tamsulosin (FLOMAX) 0.4 MG CAPS capsule Take 0.4 mg by mouth every evening. 01/12/19   [provider]  Turmeric (QC TUMERIC COMPLEX PO) Take by mouth.    [provider]      Allergies    Aspergillus allergy skin test, Aspirin, Sulfa antibiotics, and Theophyllines    Review of Systems   Review of Systems  Constitutional:  Negative for fever.  HENT:  Negative for nosebleeds.   Eyes:  Negative for pain, redness and visual disturbance.  Respiratory:  Negative for shortness of breath.   Cardiovascular:  Negative for chest pain.  Gastrointestinal:  Negative for abdominal pain, nausea and vomiting.  Genitourinary:  Negative for flank pain.  Musculoskeletal:  Negative for back pain and neck pain.  Skin:  Positive for wound.  Neurological:  Negative for weakness, numbness and headaches.  Hematological:  Does not bruise/bleed easily.    Physical Exam Updated Vital Signs BP 111/73 (BP Location: Left Arm)   Pulse 86   Temp 97.8 F (36.6 C)   Resp  16   Ht 1.676 m (5\' 6" )   Wt 45.4 kg   SpO2 99%   BMI 16.14 kg/m  Physical Exam Vitals and nursing note reviewed.  Constitutional:      Appearance: Normal appearance. He is well-developed.  HENT:     Head:     Comments: Contusion/laceration right cheek.     Nose: Nose normal.     Mouth/Throat:     Mouth: Mucous membranes are moist.     Pharynx: Oropharynx is clear.  Eyes:     General: No scleral icterus.    Conjunctiva/sclera: Conjunctivae normal.     Pupils: Pupils are equal, round, and reactive to light.  Neck:     Vascular: No  carotid bruit.     Trachea: No tracheal deviation.  Cardiovascular:     Rate and Rhythm: Normal rate and regular rhythm.     Pulses: Normal pulses.     Heart sounds: Normal heart sounds. No murmur heard.    No friction rub. No gallop.  Pulmonary:     Effort: Pulmonary effort is normal. No accessory muscle usage or respiratory distress.     Breath sounds: Normal breath sounds.  Chest:     Chest wall: No tenderness.  Abdominal:     General: There is no distension.     Palpations: Abdomen is soft.     Tenderness: There is no abdominal tenderness.     Comments: No abd pain or contusion  Genitourinary:    Comments: No cva tenderness. Musculoskeletal:        General: No swelling.     Cervical back: Normal range of motion and neck supple. No rigidity or tenderness.     Comments: CTLS spine, non tender, aligned, no step off. Good rom bil extremities without pain or focal bony tenderness.   Skin:    General: Skin is warm and dry.     Findings: No rash.  Neurological:     Mental Status: He is alert.     Comments: Alert, speech clear. GCS 15. Motor/sens grossly intact bil.   Psychiatric:        Mood and Affect: Mood normal.     ED Results / Procedures / Treatments   Labs (all labs ordered are listed, but only abnormal results are displayed) Labs Reviewed - No data to display  EKG None  Radiology No results found.  Procedures .Marland KitchenLaceration Repair  Date/Time: 12/24/2022 1:18 PM  Performed by: Cathren Laine, MD Authorized by: Cathren Laine, MD   Consent:    Consent given by:  Patient Laceration details:    Location:  Face   Length (cm):  3 Pre-procedure details:    Preparation:  Patient was prepped and draped in usual sterile fashion Exploration:    Wound exploration: entire depth of wound visualized     Contaminated: no   Treatment:    Area cleansed with:  Saline   Irrigation solution:  Sterile saline   Visualized foreign bodies/material removed: no      Debridement:  Minimal Skin repair:    Repair method:  Sutures   Suture size:  5-0   Suture material:  Nylon   Suture technique:  Simple interrupted   Number of sutures:  4 Approximation:    Approximation:  Close Repair type:    Repair type:  Simple Post-procedure details:    Dressing:  Antibiotic ointment and sterile dressing   Procedure completion:  Tolerated well, no immediate complications     Medications Ordered  in ED Medications  Tdap (BOOSTRIX) injection 0.5 mL (0.5 mLs Intramuscular Given 12/24/22 1137)  lidocaine-EPINEPHrine (XYLOCAINE W/EPI) 2 %-1:200000 (PF) injection 20 mL (20 mLs Infiltration Given 12/24/22 1137)    ED Course/ Medical Decision Making/ A&P                             Medical Decision Making Problems Addressed: Contusion of face, initial encounter: acute illness or injury with systemic symptoms Facial laceration, initial encounter: acute illness or injury Fall from slip, trip, or stumble, initial encounter: acute illness or injury with systemic symptoms that poses a threat to life or bodily functions  Amount and/or Complexity of Data Reviewed Independent Historian:     Details: Family.  External Data Reviewed: notes.  Risk Prescription drug management.   Tetanus IM.  Wound sutured.   Acetaminophen po.   Reviewed nursing notes and prior charts for additional history.   Pt remains alert, awake, responding appropriately. No headache. C spine non tender.   Pt appears stable for d/c.   Return precautions provided.           Final Clinical Impression(s) / ED Diagnoses Final diagnoses:  None    Rx / DC Orders ED Discharge Orders     None         Cathren Laine, MD 12/24/22 1323

## 2022-12-24 NOTE — ED Triage Notes (Signed)
Patient here POV from Home.  Fall a few hours ago. Was getting OOB and slipped causing him to injure his Right facial Area on a Cabinet. 3 cm laceration to same. No pain otherwise. No LOC of Anticoagulants.  NAD Noted during Triage. A&Ox4. GCS 15. BIB Wheelchair.

## 2022-12-24 NOTE — ED Provider Notes (Signed)
I was called into triage to evaluate patient by nursing staff.  He is accompanied by his wife who help provide majority of history.  They report that within the past hour or so he fell forward and hit his face/head.  This resulted in a laceration measuring approximately 4 to 5 cm inferior to the right zygomatic arch.  He does not take blood thinning medication.  He denies any associated loss of consciousness, headache, vision change, nausea, vomiting, amnesia surrounding event.  He is tender over the zygomatic arch but does not have any additional orbital tenderness.  Discussed that given he is over the age of 63 he qualifies for head CT based on [place] CT rules.  He may also benefit from CT of facial bones.  Discussed that we do not have CT capabilities in urgent care and recommended that he go to the emergency room for further evaluation and management.  He was initially hesitant but ultimately agreeable.  We discussed that if anything worsens during his transport family should pull over and call 911.  He was stable at time of discharge and wife will take him directly to ER.   Jeani Hawking, PA-C 12/24/22 1055

## 2022-12-24 NOTE — Discharge Instructions (Addendum)
It was our pleasure to provide your ER care today - we hope that you feel better.  Fall precautions.  Keep sutured wound very clean.  Have sutures removed, your doctor or urgent care, in 9-10 days.   Return to ER if worse, new symptoms, new/severe pain, severe headache, infection of wound, vomiting, or other concern.

## 2022-12-25 ENCOUNTER — Telehealth: Payer: Self-pay | Admitting: Physician Assistant

## 2022-12-25 NOTE — Telephone Encounter (Signed)
[name] patient's spouse lvm stating Evian is having symptoms from his medication Zyprexa. Symptoms consist of faintness, Bp changes resulting in falling. Please advise.  Appointment 01/18/23 Contact information # [phone]

## 2022-12-25 NOTE — Telephone Encounter (Signed)
Please see message from wife. Patient did have a facial contusion that required sutures from fall. Wife said the "lower number" was so low on BP that the machine beeped. Vitals I see from ED note show BP 111/73, pulse 86, O2 sat 99%.

## 2022-12-26 NOTE — Telephone Encounter (Signed)
Wife notified of recommendations. She said they don't have a BP cuff, suggested she purchase one.

## 2022-12-26 NOTE — Telephone Encounter (Signed)
LVM to RC 

## 2022-12-26 NOTE — Telephone Encounter (Signed)
If possible, cut the Zyprexa in half, take that at night. If not (I'm not sure how big the pills are) stop it. Either way, continue to monitor BP daily, keep a diary of it and bring to appt w/ me and PCP. He should see PCP asap too. I won't prescribe anything else to take the place of zyprexa, right now anyway.

## 2023-01-01 ENCOUNTER — Ambulatory Visit: Payer: Medicare Other | Admitting: Psychiatry

## 2023-01-04 ENCOUNTER — Telehealth: Payer: Self-pay | Admitting: Physician Assistant

## 2023-01-04 ENCOUNTER — Other Ambulatory Visit: Payer: Self-pay | Admitting: Physician Assistant

## 2023-01-04 DIAGNOSIS — Z4802 Encounter for removal of sutures: Secondary | ICD-10-CM | POA: Diagnosis not present

## 2023-01-04 DIAGNOSIS — I959 Hypotension, unspecified: Secondary | ICD-10-CM | POA: Diagnosis not present

## 2023-01-04 DIAGNOSIS — R42 Dizziness and giddiness: Secondary | ICD-10-CM | POA: Diagnosis not present

## 2023-01-04 MED ORDER — ARIPIPRAZOLE 2 MG PO TABS: 2.0000 mg | ORAL_TABLET | Freq: Every day | ORAL | 0 refills | Status: DC

## 2023-01-04 NOTE — Telephone Encounter (Signed)
Patient called with same info and on 12/26/22 she was advised as noted below. LVM to RC.   If possible, cut the Zyprexa in half, take that at night. If not (I'm not sure how big the pills are) stop it. Either way, continue to monitor BP daily, keep a diary of it and bring to appt w/ me and PCP. He should see PCP asap too. I won't prescribe anything else to take the place of zyprexa, right now anyway.

## 2023-01-04 NOTE — Telephone Encounter (Signed)
Next visit is 01/18/23. Jenner was put on Zyprexa and has been feeling faint and fell. This is from his wife [name]. She wants to know if she should cut in half or go back to Risperidone? Her phone number is [phone].  Pharmacy is:  [id number] - [place], [place] - [address] AT [facility] [address]   Phone: [phone]  Fax: [phone]

## 2023-01-04 NOTE — Telephone Encounter (Signed)
Stop the Zyprexa and start Abilify. I'm sending Rx in.

## 2023-01-04 NOTE — Telephone Encounter (Signed)
Wife called back and said patient had been taking 1/2 tablet of Zyprexa until yesterday, but he was still feeling bad and has now stopped it. Reported first BP today before breakfast was 88/55  but later today was 110/70. Wife is asking if he should go back to taking risperidone.

## 2023-01-04 NOTE — Telephone Encounter (Signed)
Wife notified.

## 2023-01-18 ENCOUNTER — Ambulatory Visit: Payer: Medicare Other | Admitting: Physician Assistant

## 2023-01-20 ENCOUNTER — Other Ambulatory Visit: Payer: Self-pay | Admitting: Physician Assistant

## 2023-01-27 ENCOUNTER — Other Ambulatory Visit: Payer: Self-pay | Admitting: Physician Assistant

## 2023-01-29 ENCOUNTER — Ambulatory Visit: Payer: Medicare Other | Admitting: Psychiatry

## 2023-02-15 ENCOUNTER — Ambulatory Visit: Payer: Medicare Other | Admitting: Physician Assistant

## 2023-02-20 ENCOUNTER — Other Ambulatory Visit: Payer: Self-pay | Admitting: Physician Assistant

## 2023-03-01 ENCOUNTER — Ambulatory Visit (INDEPENDENT_AMBULATORY_CARE_PROVIDER_SITE_OTHER): Payer: Medicare Other | Admitting: Psychiatry

## 2023-03-01 DIAGNOSIS — R630 Anorexia: Secondary | ICD-10-CM

## 2023-03-01 DIAGNOSIS — F411 Generalized anxiety disorder: Secondary | ICD-10-CM | POA: Diagnosis not present

## 2023-03-01 DIAGNOSIS — Z9189 Other specified personal risk factors, not elsewhere classified: Secondary | ICD-10-CM | POA: Diagnosis not present

## 2023-03-01 DIAGNOSIS — Z8659 Personal history of other mental and behavioral disorders: Secondary | ICD-10-CM

## 2023-03-01 DIAGNOSIS — F332 Major depressive disorder, recurrent severe without psychotic features: Secondary | ICD-10-CM | POA: Diagnosis not present

## 2023-03-01 NOTE — Progress Notes (Addendum)
Psychotherapy Progress Note [facility] [name], PhD LP  Patient ID: Jeremy [name] [facility])    MRN: [id number] Therapy format: Family therapy w/ patient -- accompanied by Jeremy [name] Date: 03/01/2023      Start: 2:05p     Stop: 2:50p     Time Spent: 45 min Location: In-person   Session narrative (presenting needs, interim history, self-report of stressors and symptoms, applications of prior therapy, status changes, and interventions made in session) Since last seen, has cancelled twice raising concerns of escalating resistance and passive hopes of death.   EHR shows 12/29/2022 ED visit for a fall and contusion with sutures, and diastolic BP reportedly very low but hospital record showed normal.  Psychiatric call to reduce Zyprexa and monitor BP at home, then to change to Abilify (early Aug) on report of an 88/55 reading before breakfast.  Patient arrived on time in care of his wife, with noticeable increase in energy compared to previous meetings.  Jeremy [name] says he has requested she be in the visit, ostensibly to get it over with faster, but at least unconsciously could be to make it less about him and also more effective.   Jeremy [name] explains that they quarreled over whether to come today, something that is notably uncomfortable for her but seems to have a good influence on Jeremy [name]'s willingness to get up and put forth the effort.  Compared to status 3 months ago, he says he continues to spend long hours in bed, by choice, and primarily eat ice cream, though Jeremy [name] does validate that she is getting some variety of food in him, including eggs, meat loaf, oatmeal, some vegetables, and overall consumption estimated about 2 plates per day.  While he is not weighing at home, her estimation is that he is holding steady, and on appearance today, he is less pale and seems to be somewhat less gaunt than usual.  Son Jeremy [name] drops by roughly every couple of weeks during his travels, and daughter and in  [place] regularly sends electronic correspondence and pictures, which Jeremy [name] portrays as things he receives secondhand, once in a while, through [employer], but she quickly points out all come to his phone, to, he just does not look at it.  Discussed phone and media policy, encouraging him to be willing to turn his phone on more of the time so that he can receive notifications and make choices whether to look at family news and communicate instead of arranged an environment in which he is continually more understimulated and detached.  ADLs are of note.  His perception that he mostly cannot shave, clarified that it feels overwhelming to him.  Similar with toothbrushing, although he is more motivated to address the feeling of a dirty mouth, and says he experiences a lot of approach and stop with tasks like this.  Similar with showers, difficult to get him up to and started with them.  Discussed at some length the experience of resistance to self-care tasks, identifying an overwhelming feeling of burden or "have to" that seems to weigh too heavily on him to initiate.  While word is that Jeremy [name] mostly does this, reviewed best practice coaxing effort, modeling matter-of-fact mess, momentary empathy when it is hard, highlighting how it will feel better after he starts, and making the commitment to effort as small as necessary to get started, for instance just turn on the water and put your hand and see how it feels.  Certainly of note is his chronically low blood  pressure.  Until recently, they were taking regular pressures about once a day, and routinely registering readings in the 80s over 50s, with primary care theory that he is having an adverse medication reaction.  In my opinion, that is far less likely than routine dehydration, something we know Jeremy [name] has put himself into repeatedly before.  Low-sodium is ruled out by blood test, though not clear how recently.  Encouraged to assume less about medication and make  sure we have not missed the more fundamental, obvious cause.  Reviewed hydration policy and practice, discovering he really tends not to drink that much when he drinks and to put plenty of time between fluid intake.  Made an agreement to use his 16 ounce water bottle as a gauge and make sure he gets through it twice a day, with Jeremy [name]'s willing assistance monitoring.  Further recommendation to resume taking blood pressure so that they know what they are dealing with and can use it to prompt action and measure response, instead of assuming it is just going to be low so why measure.  To this extent, Jeremy [name] seems complicit in giving up on actionable steps, but she agrees to do this and to prompt 4 ounces of water anytime diastolic falls below 60.  Psychiatric consultation next week, at which time they can review medication strategy to see if it will help.  My own assessment is that self-care habits and basic nursing needs still need to be fulfilled better to truly tell the difference whether medication is an issue, and a tendency to focus on medication side effects probably delays recovery.  Discussed any call or need for personal care services.  Jeremy [name] and company and admittedly can worry that she will go away for too long.  Agreed that she should have the freedom to arrange respite care, if desired, involving friends, family, or hired caregiver.  Some discussion of what the caregiver would do, with Jeremy [name] basically suggesting that there is not enough to warrant to someone, and focusing on things that are more truly nursing duties than personal care assistant duties.  Briefly validated that it is still okay to have someone else come in for companionship and flexible, light assistance, as well as behavioral prompting to stretch, move, spend time out of bed, engage in a game, monitor and prompt hydration, etc.  Given the chance that Jeremy [name], who is trying to be completely steadfast,  could suffer a setback or injury herself, encouraged making sure the kids have access to information, no will doctors to get in touch with, etc., in case of need.  Agreed we are still operating on outpatient treatment, but reaffirmed that, with sufficient deterioration, rehab hospitalization would be appropriate.    Therapeutic modalities: Cognitive Behavioral Therapy and Solution-Oriented/Positive Psychology  Mental Status/Observations:  Appearance:   Casual, Neat, and groomed      Behavior:  Rigid and more responsive  Motor:  Less stiff, more energy , still locked fingers  Speech/Language:   Clear and Coherent and less husky  Affect:  Less constricted   Mood:  depressed and severe, still, but more restless , and responsive to humor and provocation  Thought process:  Grossly intact, emotional reasoning  Thought content:    Preoccupation with functioning, inability , and wanting to have the visit over with  Sensory/Perceptual disturbances:    Ostensibly normal, but may have sensory hypersensitivity  Orientation:  grossly intact  Attention:  Good    Concentration:  Good  Memory:  grossly intact  Insight:    Fair  Judgment:   Fair  Impulse Control:  Fair   Risk Assessment: Danger to Self: No Self-injurious Behavior: No Danger to Others: No Physical Aggression / Violence: No Duty to Warn: No Access to Firearms a concern: No  Assessment of progress:  stabilized  Diagnosis:   ICD-10-CM   1. 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 History of psychosis  Z86.59     3. Potential for self care deficit  Z91.89     4. Generalized anxiety disorder  F41.1     5. Decreased appetite  R63.0      Plan:  Priority recommendations today:  Hydration -- 32 oz measured water/day, 4 oz immediately when diastolic BP < 60 Nutrition -- maintain adequate fat & protein for brain health ADLs -- standby assist and  prompting, positive encouragement not to think about a task or burden but just take a first step see how it feels (e.g., turn water on for shower, feel it) Physical activation -- Still encourage to stretch, walk, even if it is only once down the driveway and back once, and break up long times on the bed.  Endorse frequent, short walks in the house or out to break up malaise.  Recommend fully engage physical therapy to address back pain, overall flexibility and stamina, as well as psychosomatic hand immobilization.  Apply small steps, matter of factness  Social activation and management -- Endorse wife being more assertive, "Nurse [name]" as she calls it, and recruiting persistent friend [name] to visit and move with [name].  May consider hiring an in-home companion for respite or ongoing assistance.  Encourage Karen to check his phone for same family information Jeremy [name] gets, not lt it drift into disconnection and the appearance of being  Nutrition -- Fully adopt vitamin supplementation as recommended to ensure availability of B complex, D, protein, and antiinflammatory/neuroprotective elements like omega 3 and turmeric.  Improve hydration with monitored minimum 32 oz/day intake.  Assume patient's estimation is unreliable, needs a witness to assess.  Guidelines 32 oz/day, 4 oz immediately any time BP measures below 60. Bowels -- May stick with temperate dosing of Miralax but seek fluids, fiber, and more movement to recover natural bowel control Potential placement or auxiliary services -- Consider criteria and trigger conditions to engage companion service, home health, or home-based PT.  Continue to consider referral to psychiatric hospital (geri program, e.g., [place]) or rehab, to address intractable self-care deficits and wasting. Health care side issues -- Reconcile PCP and specialist therapies for what is thought to be a widespread fungal infection.  May have more to do with long hours immobile in  bed. Coordination of care -- Reserve option to consult Jeremy [name] at any time, with or without further detailed consent. Other recommendations/advice -- As may be noted above.  Continue to utilize previously learned skills ad lib. Medication compliance -- Maintain medication as prescribed and work faithfully with relevant prescriber(s) if any changes are desired or seem indicated. Crisis service -- Aware of call list and work-in appts.  Call the clinic on-call service, 988/hotline, 911, or present to [facility] or ER if any life-threatening psychiatric crisis. Followup -- Return at least monthly, for recommend sched ahead.  Next scheduled visit with me Visit date not found.  Next scheduled in this office 03/07/2023.  Robley Fries, PhD [name], PhD LP Clinical Psychologist, [facility] [address], [address] [place], [place] [address] [phone]

## 2023-03-07 ENCOUNTER — Encounter: Payer: Self-pay | Admitting: Physician Assistant

## 2023-03-07 ENCOUNTER — Ambulatory Visit (INDEPENDENT_AMBULATORY_CARE_PROVIDER_SITE_OTHER): Payer: Medicare Other | Admitting: Physician Assistant

## 2023-03-07 VITALS — BP 105/70 | HR 75 | Wt 110.4 lb

## 2023-03-07 DIAGNOSIS — R5381 Other malaise: Secondary | ICD-10-CM | POA: Diagnosis not present

## 2023-03-07 DIAGNOSIS — R636 Underweight: Secondary | ICD-10-CM | POA: Diagnosis not present

## 2023-03-07 DIAGNOSIS — R5383 Other fatigue: Secondary | ICD-10-CM

## 2023-03-07 DIAGNOSIS — F322 Major depressive disorder, single episode, severe without psychotic features: Secondary | ICD-10-CM

## 2023-03-07 MED ORDER — DULOXETINE HCL 60 MG PO CPEP: 120.0000 mg | ORAL_CAPSULE | Freq: Every day | ORAL | 1 refills | Status: DC

## 2023-03-07 NOTE — Progress Notes (Signed)
[facility]  Patient ID: Jeremy [name],  MRN: [id number]  PCP: Irena Reichmann, DO  Date of Evaluation: 03/07/2023 Time spent: 32 minutes  Chief Complaint:  Chief Complaint   Follow-up    HISTORY/CURRENT STATUS: HPI  For routine med check. Wife Jeremy [name] is with him.   We changed Risperdal to Zyprexa then end of June. Felt faint and BP was low which he attributed to the med. It was since stopped but has still had hypotension. He fell, hitting his head 12/24/2022, went to ER. No LOC. Had stitches and sent home. Pt states he slipped and fell, he didn't feel faint at the time of the fall.  Was restarted on Abilify 2 mg on 01/04/2023.   Jeremy [name] states he's about the same mentally. Still depressed, 'nothing is going to help me,' stays in bed all the time, although he's not asleep. Doesn't enjoy anything. Appetite is the same although he's forcing himself to eat ice cream all the time to get his wt back up. Personal hygiene is fair; Jeremy [name] says he doesn't want to shower, he says 'I can't do it.' Same with ADLs. When she suggests they go out so he can get his hair cut, he says, "I can't do it." Jeremy [name] agrees, that's his attitude. Is overwhelmed with doing anything. Tires him out mentally and physically. Feels hopeless. No PA.  Denies suicidal or homicidal thoughts.  Patient denies increased energy with decreased need for sleep, increased talkativeness, racing thoughts, impulsivity or risky behaviors, increased spending, increased libido, grandiosity, increased irritability or anger, paranoia, or hallucinations.  Review of Systems  Constitutional:  Positive for malaise/fatigue.  HENT: Negative.    Eyes: Negative.   Respiratory: Negative.    Cardiovascular: Negative.   Gastrointestinal: Negative.   Genitourinary:        On Tamsulosin for BPH?  Musculoskeletal:  Positive for joint pain.  Skin: Negative.   Neurological: Negative.   Endo/Heme/Allergies: Negative.   Psychiatric/Behavioral:          See HPI   Individual Medical History/ Review of Systems: Changes? :Yes     see HPI, ER notes. Tamsulosin decreased to every other day d/t low BP.   Past medications for mental health diagnoses include: (None listed in old chart) Lithium he took only for a few weeks for severe depression with suicidal thoughts but did not like it so he stopped it.  cymbalta, mirtazapine, Ativan,   Was hospitalized many times since 1964, most recent was in 2005.   Allergies: Aspergillus allergy skin test, Aspirin, Sulfa antibiotics, and Theophyllines  Current Medications:  Current Outpatient Medications:    ARIPiprazole (ABILIFY) 2 MG tablet, TAKE 1 TABLET BY MOUTH EVERY DAY, Disp: 90 tablet, Rfl: 0   clotrimazole-betamethasone (LOTRISONE) cream, APPLY TO AFFECTED AREA TWICE A DAY, Disp: 30 g, Rfl: 0   clotrimazole-betamethasone (LOTRISONE) cream, Apply 1 Application topically 2 (two) times daily., Disp: 30 g, Rfl: 0   finasteride (PROSCAR) 5 MG tablet, Take 5 mg by mouth every morning., Disp: , Rfl:    mirtazapine (REMERON) 30 MG tablet, TAKE 1 TABLET BY MOUTH AT BEDTIME., Disp: 90 tablet, Rfl: 1   polyethylene glycol (MIRALAX / GLYCOLAX) 17 g packet, Take 17 g by mouth every other day., Disp: , Rfl:    tamsulosin (FLOMAX) 0.4 MG CAPS capsule, Take 0.4 mg by mouth every evening. Every other day, Disp: , Rfl:    ammonium lactate (AMLACTIN DAILY) 12 % lotion, Apply 1 Application topically as needed for dry skin. (  Patient not taking: Reported on 06/21/2022), Disp: 400 g, Rfl: 0   b complex vitamins capsule, Take 1 capsule by mouth daily. (Patient not taking: Reported on 03/07/2023), Disp: , Rfl:    Cholecalciferol (VITAMIN D) 50 MCG (2000 UT) CAPS, Take 2 capsules (4,000 Units total) by mouth daily. (Patient not taking: Reported on 03/07/2023), Disp: 60 capsule, Rfl:    DULoxetine (CYMBALTA) 60 MG capsule, Take 2 capsules (120 mg total) by mouth daily., Disp: 180 capsule, Rfl: 1   LORazepam (ATIVAN) 0.5 MG  tablet, Take 1/2-1 tablet twice daily as needed for severe anxiety/panic (Patient not taking: Reported on 06/21/2022), Disp: 60 tablet, Rfl: 1   magnesium 30 MG tablet, Take 30 mg by mouth daily. (Patient not taking: Reported on 07/19/2021), Disp: , Rfl:    Omega-3 Fatty Acids (OMEGA 3 500 PO), Take by mouth. (Patient not taking: Reported on 03/07/2023), Disp: , Rfl:    Turmeric (QC TUMERIC COMPLEX PO), Take by mouth. (Patient not taking: Reported on 03/07/2023), Disp: , Rfl:  Medication Side Effects: none  Family Medical/ Social History: Changes? No  MENTAL HEALTH EXAM:  Blood pressure 105/70, pulse 75, weight 110 lb 6.4 oz (50.1 kg).Body mass index is 17.82 kg/m.  10# weight gain in 3.5 months  General Appearance: Casual, Well Groomed, and thin, cachetic, severe arthritic changes in DIPs, MCPs in 1st, 2nd, 3rd, and 4th digits on hands bilat with contractures   Eye Contact:  Good  Speech:  Clear and Coherent and Normal Rate  Volume:  Decreased  Mood:  Depressed  Affect:  Depressed  Thought Process:  Goal Directed and Descriptions of Associations: Circumstantial  Orientation:  Full (Time, Place, and Person)  Thought Content: Logical   Suicidal Thoughts:  No  Homicidal Thoughts:  No  Memory:  WNL  Judgement:  Good  Insight:  Good  Psychomotor Activity:  Decreased and mild left hand tremor at rest  Concentration:  Concentration: Good and Attention Span: Good  Recall:  Good  Fund of Knowledge: Good  Language: Good  Assets:  Special educational needs teacher  ADL's:  Intact  Cognition: WNL  Prognosis:  Poor   PCP follows labs   DIAGNOSES:    ICD-10-CM   1. Severe depression (HCC)  F32.2     2. Malaise and fatigue  R53.81    R53.83     3. Underweight  R63.6      Receiving Psychotherapy: Yes Dr. Mardelle Matte Mitchum   RECOMMENDATIONS:  PDMP was reviewed. Ativan filled 04/26/2021. I provided 32 minutes of face to face time during this  encounter, including time spent before and after the visit in records review, medical decision making, counseling pertinent to today's visit, and charting.   We had a long discussion about the severe, uncontrolled depression. Amelio states he is not willing to try other meds at this time. He and Jeremy [name] have talked this over and they feel like the trial and error period could be long and drawn out, and in the meantime, he may get worse instead of better. Jeremy [name] says at 87 years old, they don't want to put him through changes that aren't guaranteed to help. Magnus said, 'I'm not willing to risk it.' He is of sound mind and able to make this decision, so I'm not pushing med changes. Of course I will if he or Jeremy [name] wants that. They understand but are comfortable with this decision at this time.   He's gained 10# in 3 months so that's  encouraging. Continue with ice cream but add some protein, fruits and veggies daily.   Continue Abilify 2 mg, 1 every day. Continue Cymbalta 60 mg, 2 p.o. daily. Continue mirtazapine 30 mg, 1 p.o. nightly. Recommend restarting multivitamin, vitamin D, B complex, and fish oil. Continue therapy with Dr. [name]. Return in 3 months.  Melony Overly, PA-C

## 2023-04-17 ENCOUNTER — Encounter: Payer: Self-pay | Admitting: Psychiatry

## 2023-04-25 IMAGING — CT CT ABD-PELV W/ CM
2 of 5 series · 16 of 46 positions shown, 18 images · IV contrast (APPLIED)
Comparison: No priors.

CLINICAL DATA: 85-year-old male with history of constipation.
Suspected bowel obstruction.

EXAM:
CT ABDOMEN AND PELVIS WITH CONTRAST
TECHNIQUE: Multidetector CT imaging of the abdomen and pelvis was performed
using the standard protocol following bolus administration of
intravenous contrast.
CONTRAST:  100mL OMNIPAQUE IOHEXOL 300 MG/ML  SOLN

[Series 3: abd/ pelvis 5.0 i30f 2 · axial · 0.70mm/px · z∈[+651,+991]mm · 13 of 78 slices shown, 15 images]
[im 5/78  soft-tissue]
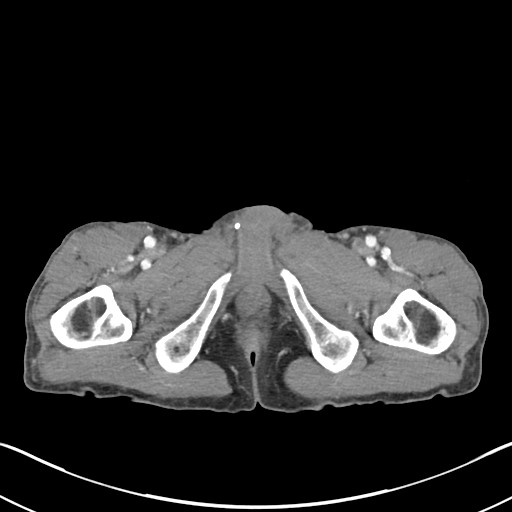
[im 5/78  bone]
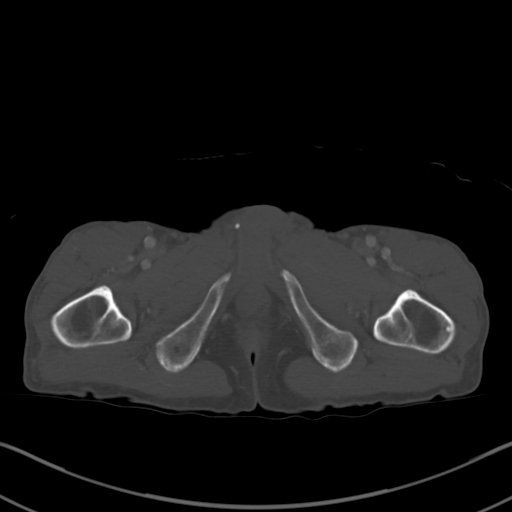
[im 13/78  soft-tissue]
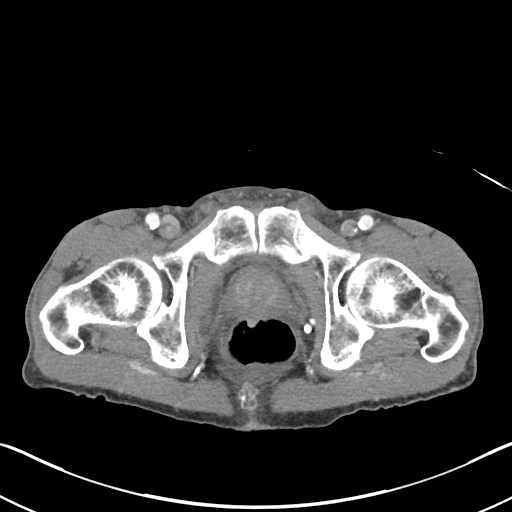
[im 17/78  soft-tissue]
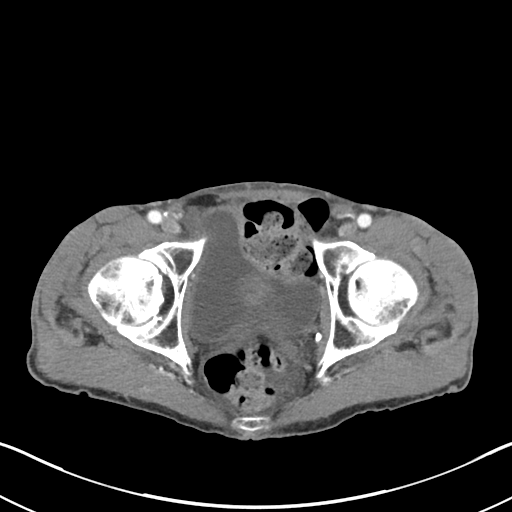
[im 21/78  soft-tissue]
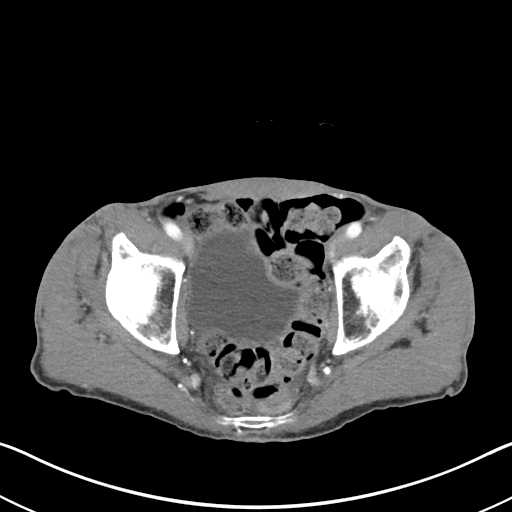
[im 29/78  soft-tissue]
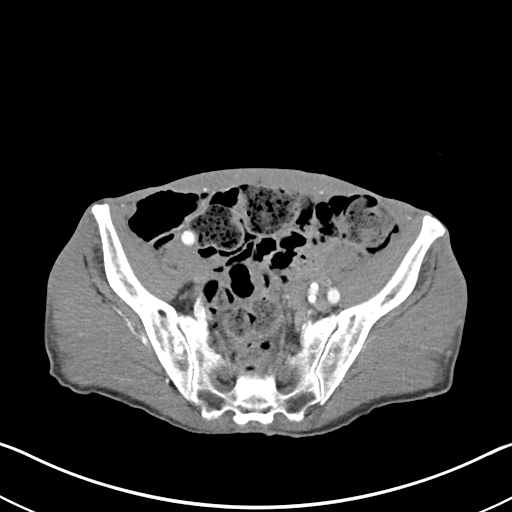
[im 33/78  soft-tissue]
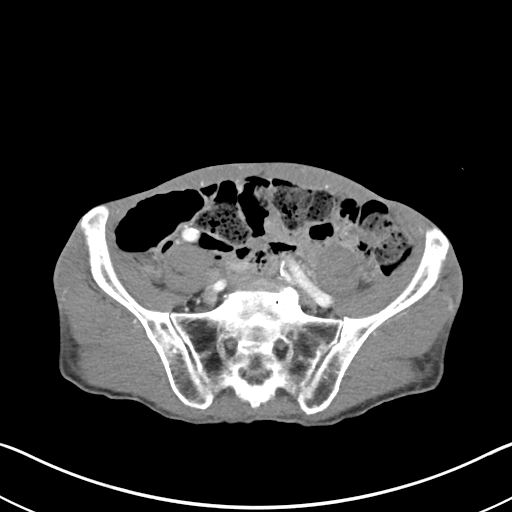
[im 41/78  soft-tissue]
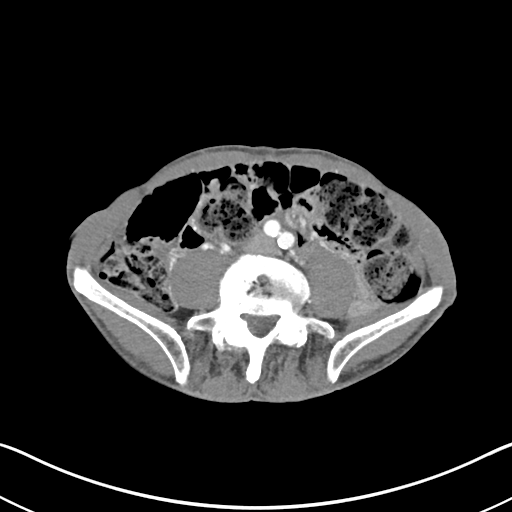
[im 45/78  soft-tissue]
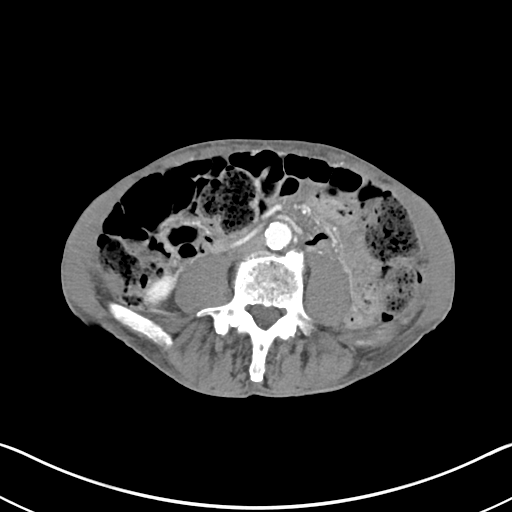
[im 49/78  soft-tissue]
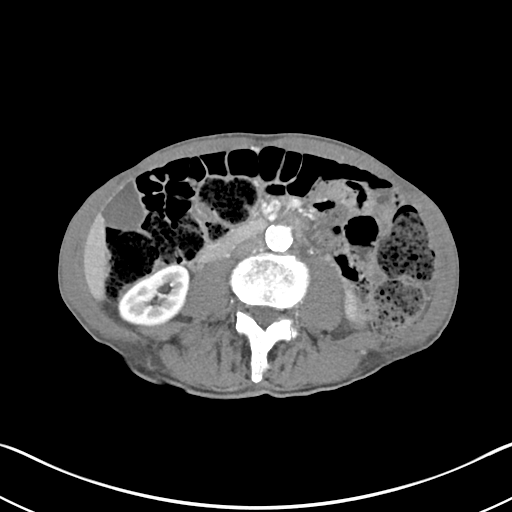
[im 49/78  bone]
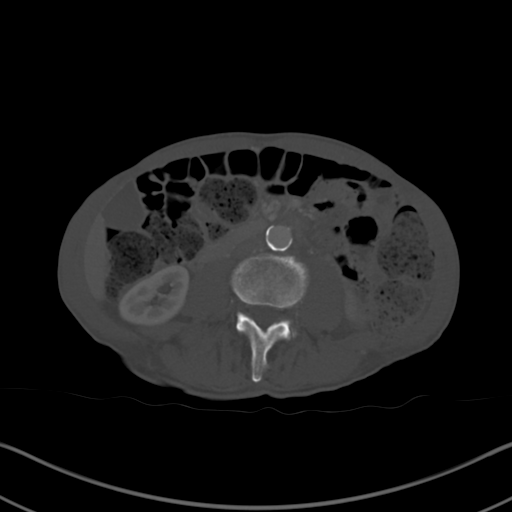
[im 57/78  soft-tissue]
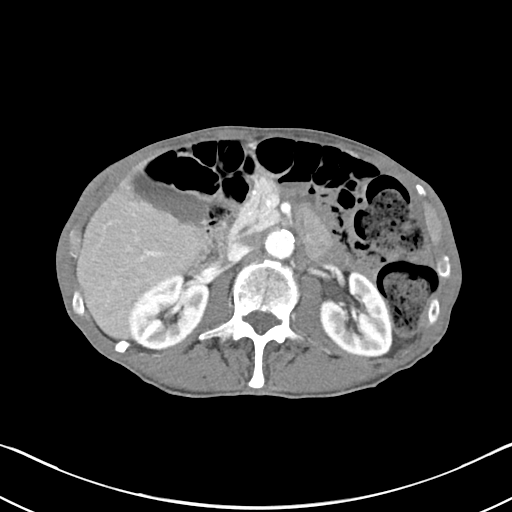
[im 61/78  soft-tissue]
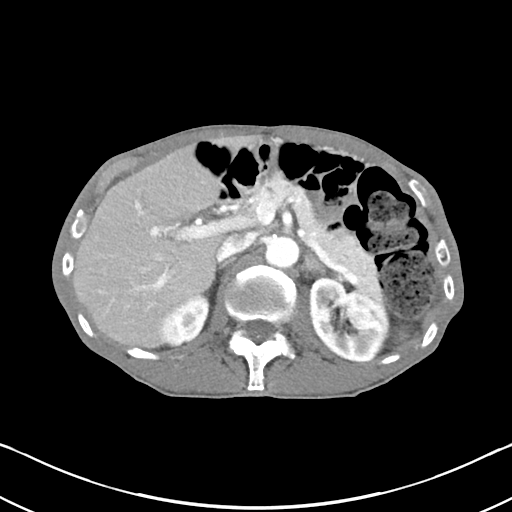
[im 65/78  soft-tissue]
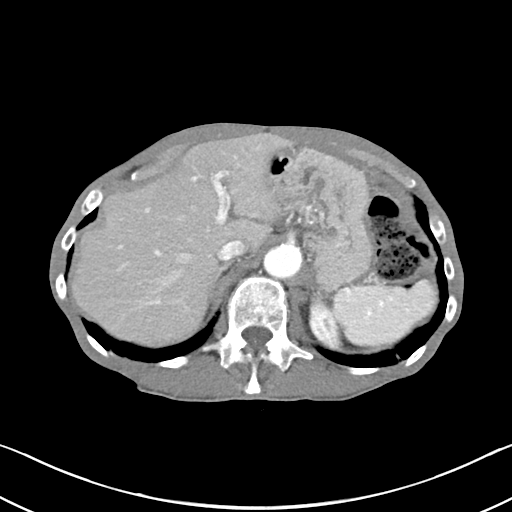
[im 73/78  soft-tissue]
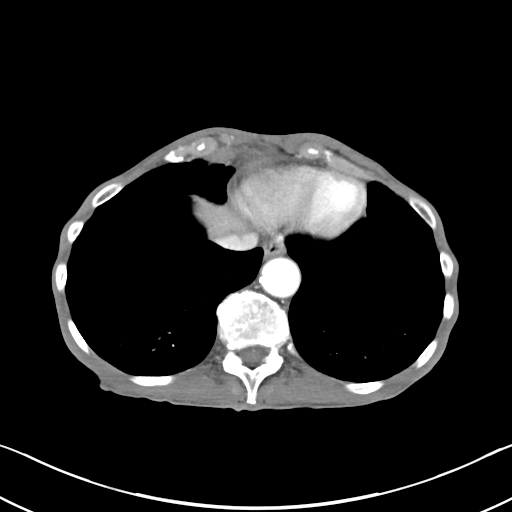

[Series 6: coronal soft tissue · coronal · 0.66mm/px · 3 of 76 slices shown]
[im 26/76  soft-tissue]
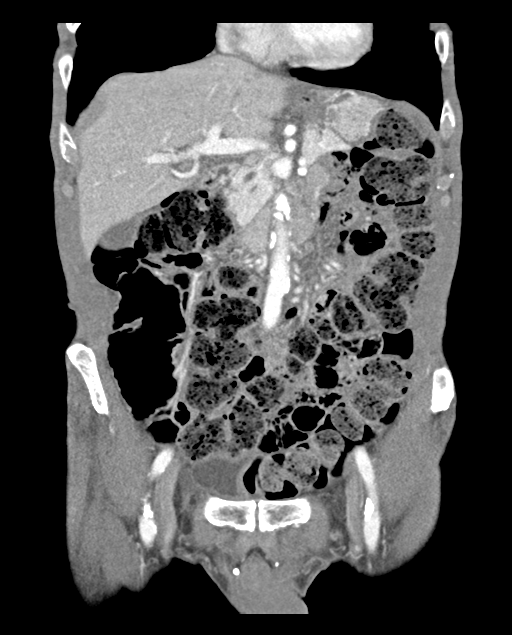
[im 34/76  soft-tissue]
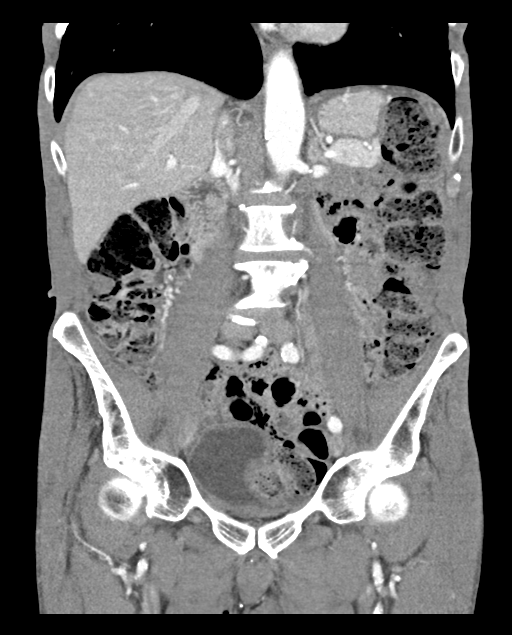
[im 42/76  soft-tissue]
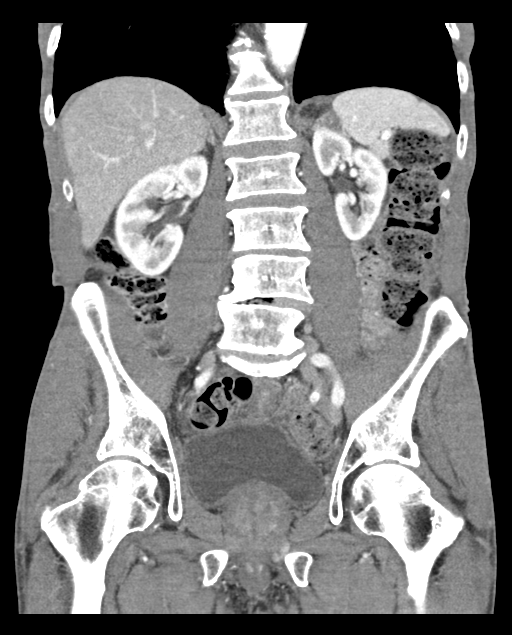

[16 of 46 positions shown; findings below may reference images not displayed]

FINDINGS: Lower chest: Scattered areas of cylindrical bronchiectasis and mild
scarring in the lung bases bilaterally. Atherosclerotic
calcifications in the descending thoracic aorta.

Hepatobiliary: No suspicious cystic or solid hepatic lesions. No
intra or extrahepatic biliary ductal dilatation. Gallbladder is
normal in appearance.

Pancreas: No pancreatic mass. No pancreatic ductal dilatation. No
pancreatic or peripancreatic fluid collections or inflammatory
changes.

Spleen: Unremarkable.

Adrenals/Urinary Tract: Subcentimeter low-attenuation lesion in the
lower pole the left kidney, too small to characterize, but
statistically likely to represent a tiny cyst. Right kidney and
bilateral adrenal glands are normal in appearance. No
hydroureteronephrosis. Urinary bladder is normal in appearance.

Stomach/Bowel: The appearance of the stomach is normal. No
pathologic dilatation of small bowel or colon. Large volume of stool
throughout the colon. The appendix is not confidently identified and
may be surgically absent. Regardless, there are no inflammatory
changes noted adjacent to the cecum to suggest the presence of an
acute appendicitis at this time.

Vascular/Lymphatic: Aortic atherosclerosis, without evidence of
aneurysm or dissection in the abdominal or pelvic vasculature. No
lymphadenopathy noted in the abdomen or pelvis.

Reproductive: Prostate gland and seminal vesicles are unremarkable
in appearance.

Other: Trace volume of ascites.  No pneumoperitoneum.

Musculoskeletal: There are no aggressive appearing lytic or blastic
lesions noted in the visualized portions of the skeleton.
IMPRESSION: 1. No evidence of bowel obstruction.
2. Large volume of stool in the colon, suggesting constipation.
3. Trace volume of ascites.
4. Mild cylindrical bronchiectasis noted throughout the visualize
lung bases.
5. Aortic atherosclerosis.
6. Additional incidental findings, as above.

## 2023-05-20 ENCOUNTER — Encounter (HOSPITAL_COMMUNITY): Payer: Self-pay

## 2023-05-20 ENCOUNTER — Other Ambulatory Visit: Payer: Self-pay

## 2023-05-20 ENCOUNTER — Inpatient Hospital Stay (HOSPITAL_COMMUNITY)
Admission: EM | Admit: 2023-05-20 | Discharge: 2023-05-23 | DRG: 522 | Disposition: A | Discharge status: Rehabilitation Facility | Payer: Medicare Other | Attending: Internal Medicine | Admitting: Internal Medicine

## 2023-05-20 ENCOUNTER — Inpatient Hospital Stay (HOSPITAL_COMMUNITY): Payer: Medicare Other

## 2023-05-20 ENCOUNTER — Emergency Department (HOSPITAL_COMMUNITY): Payer: Medicare Other

## 2023-05-20 DIAGNOSIS — W19XXXA Unspecified fall, initial encounter: Secondary | ICD-10-CM | POA: Diagnosis not present

## 2023-05-20 DIAGNOSIS — S72001A Fracture of unspecified part of neck of right femur, initial encounter for closed fracture: Secondary | ICD-10-CM | POA: Diagnosis not present

## 2023-05-20 DIAGNOSIS — E871 Hypo-osmolality and hyponatremia: Secondary | ICD-10-CM | POA: Diagnosis not present

## 2023-05-20 DIAGNOSIS — M25572 Pain in left ankle and joints of left foot: Secondary | ICD-10-CM | POA: Diagnosis not present

## 2023-05-20 DIAGNOSIS — I440 Atrioventricular block, first degree: Secondary | ICD-10-CM | POA: Diagnosis not present

## 2023-05-20 DIAGNOSIS — R32 Unspecified urinary incontinence: Secondary | ICD-10-CM | POA: Diagnosis not present

## 2023-05-20 DIAGNOSIS — E440 Moderate protein-calorie malnutrition: Secondary | ICD-10-CM | POA: Insufficient documentation

## 2023-05-20 DIAGNOSIS — F419 Anxiety disorder, unspecified: Secondary | ICD-10-CM | POA: Diagnosis present

## 2023-05-20 DIAGNOSIS — S72009A Fracture of unspecified part of neck of unspecified femur, initial encounter for closed fracture: Secondary | ICD-10-CM | POA: Diagnosis not present

## 2023-05-20 DIAGNOSIS — Z882 Allergy status to sulfonamides status: Secondary | ICD-10-CM

## 2023-05-20 DIAGNOSIS — D62 Acute posthemorrhagic anemia: Secondary | ICD-10-CM | POA: Diagnosis not present

## 2023-05-20 DIAGNOSIS — F32A Depression, unspecified: Secondary | ICD-10-CM

## 2023-05-20 DIAGNOSIS — Z888 Allergy status to other drugs, medicaments and biological substances status: Secondary | ICD-10-CM

## 2023-05-20 DIAGNOSIS — R9431 Abnormal electrocardiogram [ECG] [EKG]: Secondary | ICD-10-CM | POA: Diagnosis not present

## 2023-05-20 DIAGNOSIS — W01190A Fall on same level from slipping, tripping and stumbling with subsequent striking against furniture, initial encounter: Secondary | ICD-10-CM | POA: Diagnosis present

## 2023-05-20 DIAGNOSIS — G8918 Other acute postprocedural pain: Secondary | ICD-10-CM | POA: Diagnosis not present

## 2023-05-20 DIAGNOSIS — F411 Generalized anxiety disorder: Secondary | ICD-10-CM | POA: Diagnosis not present

## 2023-05-20 DIAGNOSIS — Z833 Family history of diabetes mellitus: Secondary | ICD-10-CM | POA: Diagnosis not present

## 2023-05-20 DIAGNOSIS — W19XXXD Unspecified fall, subsequent encounter: Secondary | ICD-10-CM | POA: Diagnosis present

## 2023-05-20 DIAGNOSIS — J42 Unspecified chronic bronchitis: Secondary | ICD-10-CM | POA: Diagnosis not present

## 2023-05-20 DIAGNOSIS — Z79899 Other long term (current) drug therapy: Secondary | ICD-10-CM

## 2023-05-20 DIAGNOSIS — I700 Atherosclerosis of aorta: Secondary | ICD-10-CM | POA: Diagnosis present

## 2023-05-20 DIAGNOSIS — Z96641 Presence of right artificial hip joint: Secondary | ICD-10-CM | POA: Diagnosis not present

## 2023-05-20 DIAGNOSIS — Z66 Do not resuscitate: Secondary | ICD-10-CM | POA: Diagnosis present

## 2023-05-20 DIAGNOSIS — K5900 Constipation, unspecified: Secondary | ICD-10-CM | POA: Diagnosis not present

## 2023-05-20 DIAGNOSIS — S7291XS Unspecified fracture of right femur, sequela: Secondary | ICD-10-CM | POA: Diagnosis not present

## 2023-05-20 DIAGNOSIS — Z886 Allergy status to analgesic agent status: Secondary | ICD-10-CM | POA: Diagnosis not present

## 2023-05-20 DIAGNOSIS — J479 Bronchiectasis, uncomplicated: Secondary | ICD-10-CM | POA: Diagnosis not present

## 2023-05-20 DIAGNOSIS — S72011A Unspecified intracapsular fracture of right femur, initial encounter for closed fracture: Secondary | ICD-10-CM | POA: Diagnosis not present

## 2023-05-20 DIAGNOSIS — K5901 Slow transit constipation: Secondary | ICD-10-CM | POA: Diagnosis not present

## 2023-05-20 DIAGNOSIS — Y92009 Unspecified place in unspecified non-institutional (private) residence as the place of occurrence of the external cause: Secondary | ICD-10-CM

## 2023-05-20 DIAGNOSIS — G8929 Other chronic pain: Secondary | ICD-10-CM | POA: Diagnosis present

## 2023-05-20 DIAGNOSIS — F418 Other specified anxiety disorders: Secondary | ICD-10-CM | POA: Diagnosis not present

## 2023-05-20 DIAGNOSIS — Z681 Body mass index (BMI) 19 or less, adult: Secondary | ICD-10-CM | POA: Diagnosis not present

## 2023-05-20 DIAGNOSIS — N400 Enlarged prostate without lower urinary tract symptoms: Secondary | ICD-10-CM

## 2023-05-20 DIAGNOSIS — Z043 Encounter for examination and observation following other accident: Secondary | ICD-10-CM | POA: Diagnosis not present

## 2023-05-20 DIAGNOSIS — M545 Low back pain, unspecified: Secondary | ICD-10-CM | POA: Diagnosis not present

## 2023-05-20 DIAGNOSIS — D649 Anemia, unspecified: Secondary | ICD-10-CM

## 2023-05-20 DIAGNOSIS — S72141A Displaced intertrochanteric fracture of right femur, initial encounter for closed fracture: Secondary | ICD-10-CM | POA: Diagnosis not present

## 2023-05-20 DIAGNOSIS — M25551 Pain in right hip: Secondary | ICD-10-CM | POA: Diagnosis not present

## 2023-05-20 DIAGNOSIS — Z471 Aftercare following joint replacement surgery: Secondary | ICD-10-CM | POA: Diagnosis not present

## 2023-05-20 DIAGNOSIS — E876 Hypokalemia: Secondary | ICD-10-CM | POA: Diagnosis not present

## 2023-05-20 DIAGNOSIS — I10 Essential (primary) hypertension: Secondary | ICD-10-CM | POA: Diagnosis not present

## 2023-05-20 DIAGNOSIS — R609 Edema, unspecified: Secondary | ICD-10-CM | POA: Diagnosis not present

## 2023-05-20 DIAGNOSIS — E611 Iron deficiency: Secondary | ICD-10-CM | POA: Diagnosis not present

## 2023-05-20 DIAGNOSIS — S72011D Unspecified intracapsular fracture of right femur, subsequent encounter for closed fracture with routine healing: Secondary | ICD-10-CM | POA: Diagnosis not present

## 2023-05-20 DIAGNOSIS — R29898 Other symptoms and signs involving the musculoskeletal system: Secondary | ICD-10-CM | POA: Diagnosis not present

## 2023-05-20 DIAGNOSIS — R54 Age-related physical debility: Secondary | ICD-10-CM | POA: Diagnosis not present

## 2023-05-20 LAB — BASIC METABOLIC PANEL
Anion gap: 10 (ref 5–15)
BUN: 15 mg/dL (ref 8–23)
CO2: 26 mmol/L (ref 22–32)
Calcium: 9.1 mg/dL (ref 8.9–10.3)
Chloride: 102 mmol/L (ref 98–111)
Creatinine, Ser: 0.74 mg/dL (ref 0.61–1.24)
GFR, Estimated: >60 mL/min (ref >60)
Glucose, Bld: 106 mg/dL — ABNORMAL HIGH (ref 70–99)
Potassium: 4.1 mmol/L (ref 3.5–5.1)
Sodium: 138 mmol/L (ref 135–145)

## 2023-05-20 LAB — CBC WITH DIFFERENTIAL/PLATELET
Abs Immature Granulocytes: 0.04 10*3/uL (ref 0.00–0.07)
Basophils Absolute: 0.1 10*3/uL (ref 0.0–0.1)
Basophils Relative: 1 %
Eosinophils Absolute: 0.2 10*3/uL (ref 0.0–0.5)
Eosinophils Relative: 3 %
HCT: 35.6 % — ABNORMAL LOW (ref 39.0–52.0)
Hemoglobin: 11.9 g/dL — ABNORMAL LOW (ref 13.0–17.0)
Immature Granulocytes: 1 %
Lymphocytes Relative: 17 %
Lymphs Abs: 1.2 10*3/uL (ref 0.7–4.0)
MCH: 31 pg (ref 26.0–34.0)
MCHC: 33.4 g/dL (ref 30.0–36.0)
MCV: 92.7 fL (ref 80.0–100.0)
Monocytes Absolute: 0.6 10*3/uL (ref 0.1–1.0)
Monocytes Relative: 9 %
Neutro Abs: 4.8 10*3/uL (ref 1.7–7.7)
Neutrophils Relative %: 69 %
Platelets: 232 10*3/uL (ref 150–400)
RBC: 3.84 MIL/uL — ABNORMAL LOW (ref 4.22–5.81)
RDW: 12 % (ref 11.5–15.5)
WBC: 6.9 10*3/uL (ref 4.0–10.5)
nRBC: 0 % (ref 0.0–0.2)

## 2023-05-20 LAB — PROTIME-INR
INR: 1 (ref 0.8–1.2)
Prothrombin Time: 13.3 s (ref 11.4–15.2)

## 2023-05-20 LAB — TYPE AND SCREEN
ABO/RH(D): O POS
Antibody Screen: NEGATIVE

## 2023-05-20 LAB — ABO/RH: ABO/RH(D): O POS

## 2023-05-20 MED ORDER — OXYCODONE HCL 5 MG PO TABS: 5.0000 mg | ORAL_TABLET | Freq: Four times a day (QID) | ORAL | Status: DC | PRN

## 2023-05-20 MED ORDER — NALOXONE HCL 0.4 MG/ML IJ SOLN: 0.4000 mg | INTRAMUSCULAR | Status: DC | PRN

## 2023-05-20 MED ORDER — METHOCARBAMOL 1000 MG/10ML IJ SOLN: 500.0000 mg | Freq: Four times a day (QID) | INTRAMUSCULAR | Status: DC | PRN

## 2023-05-20 MED ORDER — TAMSULOSIN HCL 0.4 MG PO CAPS
0.4000 mg | ORAL_CAPSULE | ORAL | Status: DC
Start: 2023-05-21 — End: 2023-05-23
  Administered 2023-05-21: 0.4 mg via ORAL
  Filled 2023-05-20: qty 1

## 2023-05-20 MED ORDER — SODIUM CHLORIDE 0.9 % IV SOLN: INTRAVENOUS | Status: AC

## 2023-05-20 MED ORDER — ARIPIPRAZOLE 2 MG PO TABS
2.0000 mg | ORAL_TABLET | Freq: Every day | ORAL | Status: DC
  Administered 2023-05-21: 2 mg via ORAL
  Filled 2023-05-20: qty 1

## 2023-05-20 MED ORDER — DULOXETINE HCL 60 MG PO CPEP
120.0000 mg | ORAL_CAPSULE | Freq: Every day | ORAL | Status: DC
  Administered 2023-05-21 – 2023-05-23 (×3): 120 mg via ORAL
  Filled 2023-05-20: qty 2
  Filled 2023-05-20: qty 4
  Filled 2023-05-20: qty 2

## 2023-05-20 MED ORDER — METHOCARBAMOL 500 MG PO TABS
500.0000 mg | ORAL_TABLET | Freq: Four times a day (QID) | ORAL | Status: DC | PRN
  Administered 2023-05-21 – 2023-05-23 (×3): 500 mg via ORAL
  Filled 2023-05-20 (×3): qty 1

## 2023-05-20 MED ORDER — MORPHINE SULFATE (PF) 2 MG/ML IV SOLN
0.5000 mg | INTRAVENOUS | Status: DC | PRN
  Administered 2023-05-21 (×2): 0.5 mg via INTRAVENOUS
  Filled 2023-05-20 (×2): qty 1

## 2023-05-20 MED ORDER — POLYETHYLENE GLYCOL 3350 17 G PO PACK: 17.0000 g | PACK | Freq: Every day | ORAL | Status: DC | PRN

## 2023-05-20 MED ORDER — TAMSULOSIN HCL 0.4 MG PO CAPS
0.4000 mg | ORAL_CAPSULE | Freq: Every evening | ORAL | Status: DC
Start: 2023-05-20 — End: 2023-05-20
  Filled 2023-05-20: qty 1

## 2023-05-20 MED ORDER — MIRTAZAPINE 15 MG PO TABS
30.0000 mg | ORAL_TABLET | Freq: Every day | ORAL | Status: DC
  Administered 2023-05-21: 30 mg via ORAL
  Filled 2023-05-20 (×2): qty 2

## 2023-05-20 MED ORDER — OXYCODONE HCL 5 MG PO TABS
5.0000 mg | ORAL_TABLET | Freq: Four times a day (QID) | ORAL | Status: DC | PRN
  Administered 2023-05-20: 5 mg via ORAL
  Filled 2023-05-20 (×2): qty 1

## 2023-05-20 MED ORDER — FINASTERIDE 5 MG PO TABS
5.0000 mg | ORAL_TABLET | Freq: Every morning | ORAL | Status: DC
  Administered 2023-05-21 – 2023-05-23 (×3): 5 mg via ORAL
  Filled 2023-05-20 (×3): qty 1

## 2023-05-20 MED ORDER — ACETAMINOPHEN 325 MG PO TABS: 650.0000 mg | ORAL_TABLET | Freq: Four times a day (QID) | ORAL | Status: DC | PRN

## 2023-05-20 MED ORDER — FENTANYL CITRATE PF 50 MCG/ML IJ SOSY: 40.0000 ug | PREFILLED_SYRINGE | Freq: Once | INTRAMUSCULAR | Status: DC | PRN

## 2023-05-20 NOTE — Progress Notes (Signed)
Orthopedic Tech Progress Note Patient Details:  Jeremy [name] 25-Oct-1935 [id number]  Patient ID: Jeremy [name], male   DOB: 06-26-1935, 87 y.o.   MRN: [id number] Patient doesn't meet criteria for ohf. Pt must be under 70 to get ohf. [name] 05/20/2023, 10:25 PM

## 2023-05-20 NOTE — Consult Note (Signed)
[facility]  ORTHOPAEDIC CONSULTATION  REQUESTING PHYSICIAN: [name] Giovanni, MD  ASSESSMENT AND PLAN: 87 y.o. male with the following: Right Hip Displaced femoral neck fracture  This patient requires inpatient admission to the hospitalist, to include preoperative clearance and perioperative medical management  - Weight Bearing Status/Activity: NWB Right lower extremity  - Additional recommended labs/tests:   -VTE Prophylaxis: Please hold prior to OR; to resume POD#1 at the discretion of the primary team  - Pain control: Recommend PO pain medications PRN; judicious use of narcotics  -Procedures: Plan for OR once patient has been medically optimized  Plan for Right Total hip arthroplasty, likely tomorrow     Chief Complaint: Right hip pain  HPI: Jeremy [name] is a 87 y.o. male who presented to the ED for evaluation after sustaining a mechanical fall.  He he states he slipped and fell and hit his right hip on a dresser.  He is complaining of Right hip pain.  Denies any other injury.  Denies any blood thinners.  Past Medical History:  Diagnosis Date   Aortic atherosclerosis (HCC)    BPH (benign prostatic hyperplasia)    Bronchiectasis (HCC)    Depression    Depression    GAD (generalized anxiety disorder)    Past Surgical History:  Procedure Laterality Date   APPENDECTOMY     HERNIA REPAIR     Social History   Socioeconomic History   Marital status: Married    Spouse name: Not on file   Number of children: Not on file   Years of education: Not on file   Highest education level: Not on file  Occupational History   Not on file  Tobacco Use   Smoking status: Never   Smokeless tobacco: Never  Vaping Use   Vaping status: Never Used  Substance and Sexual Activity   Alcohol use: Not Currently    Alcohol/week: 4.0 standard drinks of alcohol    Types: 4 Glasses of wine per week   Drug use: No   Sexual activity: Not on file  Other Topics Concern   Not on file  Social  History Narrative   Not on file   Social Drivers of Health   Financial Resource Strain: Not on file  Food Insecurity: Not on file  Transportation Needs: Not on file  Physical Activity: Not on file  Stress: Not on file  Social Connections: Not on file   Family History  Problem Relation Age of Onset   Diabetes Mother    Colon cancer Neg Hx    Stomach cancer Neg Hx    Allergies  Allergen Reactions   Aspergillus Allergy Skin Test Cough   Aspirin     Irritates stomach   Sulfa Antibiotics Other (See Comments)    Childhood    Theophyllines Other (See Comments)    Does not remember   Prior to Admission medications   Medication Sig Start Date End Date Taking? Authorizing Provider  ARIPiprazole (ABILIFY) 2 MG tablet TAKE 1 TABLET BY MOUTH EVERY DAY 02/21/23  Yes Hurst, Teresa T, PA-C  clotrimazole-betamethasone (LOTRISONE) cream APPLY TO AFFECTED AREA TWICE A DAY Patient taking differently: Apply 1 Application topically daily. 05/29/22  Yes Candelaria Stagers, DPM  DULoxetine (CYMBALTA) 60 MG capsule Take 2 capsules (120 mg total) by mouth daily. 03/07/23  Yes Melony Overly T, PA-C  finasteride (PROSCAR) 5 MG tablet Take 5 mg by mouth every morning. 01/10/19  Yes [provider]  LORazepam (ATIVAN) 0.5 MG tablet Take 1/2-1 tablet  twice daily as needed for severe anxiety/panic 04/26/21  Yes Melony Overly T, PA-C  mirtazapine (REMERON) 30 MG tablet TAKE 1 TABLET BY MOUTH AT BEDTIME. 11/23/22  Yes Hurst, Teresa T, PA-C  polyethylene glycol (MIRALAX / GLYCOLAX) 17 g packet Take 17 g by mouth every other day.   Yes [provider]  risperiDONE (RISPERDAL) 3 MG tablet Take 3 mg by mouth daily.   Yes [provider]  tamsulosin (FLOMAX) 0.4 MG CAPS capsule Take 0.4 mg by mouth every evening. Every other day 01/12/19  Yes [provider]   No results found. Family History Reviewed and non-contributory, no pertinent history of problems with bleeding or  anesthesia    Review of Systems No fevers or chills No numbness or tingling No chest pain No shortness of breath No bowel or bladder dysfunction No GI distress No headaches    OBJECTIVE  Vitals:Patient Vitals for the past 8 hrs:  BP Pulse Resp SpO2 Height Weight  05/20/23 1745 111/76 68 18 100 % -- --  05/20/23 1733 -- -- -- -- 5\' 6"  (1.676 m) 49.9 kg  05/20/23 1732 -- -- -- 98 % -- --   General: Alert, no acute distress Cardiovascular: Extremities are warm Respiratory: No cyanosis, no use of accessory musculature Skin: No lesions in the area of chief complaint  Neurologic: Sensation intact distally  Psychiatric: Patient is competent for consent with normal mood and affect Lymphatic: No swelling obvious and reported other than the area involved in the exam below Extremities  Right LE: Extremity held in a fixed position.  ROM deferred due to known fracture.  Sensation is intact distally in the sural, saphenous, DP, SP, and plantar nerve distribution. 2+ DP pulse.  Toes are WWP.  Active motion intact in the TA/EHL/GS. Left LE: Sensation is intact distally in the sural, saphenous, DP, SP, and plantar nerve distribution. 2+ DP pulse.  Toes are WWP.  Active motion intact in the TA/EHL/GS. Tolerates gentle ROM of the hip.  No pain with axial loading.     Test Results Imaging XR of the Right demonstrates a Displaced femoral neck fracture.  Labs cbc Recent Labs    05/20/23 1811  WBC 6.9  HGB 11.9*  HCT 35.6*  PLT 232    Labs inflam No results for input(s): "CRP" in the last 72 hours.  Invalid input(s): "ESR"  Labs coag Recent Labs    05/20/23 1811  INR 1.0    Recent Labs    05/20/23 1811  NA 138  K 4.1  CL 102  CO2 26  GLUCOSE 106*  BUN 15  CREATININE 0.74  CALCIUM 9.1

## 2023-05-20 NOTE — H&P (Signed)
History and Physical    Laurier Cambron VHQ:[id number] DOB: 1936/05/24 DOA: 05/20/2023  PCP: Irena Reichmann, DO  Patient coming from: Home  Chief Complaint: Fall  HPI: Jeremy [name] is a 87 y.o. male with medical history significant of aortic atherosclerosis, BPH, bronchiectasis, depression, anxiety presented to the ED with right hip pain after mechanical fall.  Vital signs stable.  Labs notable for hemoglobin 11.9 (previously >14 on labs done about 2 years ago), MCV 92.7.  X-ray results not posted by radiologist yet but image reviewed by orthopedics and showing right hip displaced femoral neck fracture.  Orthopedics planning on surgery tomorrow.  [facility] called to admit.  Patient states he was putting on some clothes when he slipped and fell on his right side hitting a dresser.  Denies head injury or loss of consciousness.  He had difficulty getting up from the floor and could not walk due to severe pain in his right hip.  Denies any recent illness.  Denies fevers, dizziness, cough, shortness of breath, chest pain, nausea, vomiting, abdominal pain, diarrhea, or any urinary symptoms.  Review of Systems:  Review of Systems  All other systems reviewed and are negative.   Past Medical History:  Diagnosis Date   Aortic atherosclerosis (HCC)    BPH (benign prostatic hyperplasia)    Bronchiectasis (HCC)    Depression    Depression    GAD (generalized anxiety disorder)     Past Surgical History:  Procedure Laterality Date   APPENDECTOMY     HERNIA REPAIR       reports that he has never smoked. He has never used smokeless tobacco. He reports that he does not currently use alcohol after a past usage of about 4.0 standard drinks of alcohol per week. He reports that he does not use drugs.  Allergies  Allergen Reactions   Aspergillus Allergy Skin Test Cough   Aspirin     Irritates stomach   Sulfa Antibiotics Other (See Comments)    Childhood    Theophyllines Other (See Comments)    Does not  remember    Family History  Problem Relation Age of Onset   Diabetes Mother    Colon cancer Neg Hx    Stomach cancer Neg Hx     Prior to Admission medications   Medication Sig Start Date End Date Taking? Authorizing Provider  ammonium lactate (AMLACTIN DAILY) 12 % lotion Apply 1 Application topically as needed for dry skin. Patient not taking: Reported on 06/21/2022 06/20/22   Candelaria Stagers, DPM  ARIPiprazole (ABILIFY) 2 MG tablet TAKE 1 TABLET BY MOUTH EVERY DAY 02/21/23   Melony Overly T, PA-C  b complex vitamins capsule Take 1 capsule by mouth daily. Patient not taking: Reported on 03/07/2023    [provider]  Cholecalciferol (VITAMIN D) 50 MCG (2000 UT) CAPS Take 2 capsules (4,000 Units total) by mouth daily. Patient not taking: Reported on 03/07/2023 06/21/22   Melony Overly T, PA-C  clotrimazole-betamethasone (LOTRISONE) cream APPLY TO AFFECTED AREA TWICE A DAY 05/29/22   Candelaria Stagers, DPM  clotrimazole-betamethasone (LOTRISONE) cream Apply 1 Application topically 2 (two) times daily. 06/20/22   Candelaria Stagers, DPM  DULoxetine (CYMBALTA) 60 MG capsule Take 2 capsules (120 mg total) by mouth daily. 03/07/23   Melony Overly T, PA-C  finasteride (PROSCAR) 5 MG tablet Take 5 mg by mouth every morning. 01/10/19   [provider]  LORazepam (ATIVAN) 0.5 MG tablet Take 1/2-1 tablet twice daily as needed for severe  anxiety/panic Patient not taking: Reported on 06/21/2022 04/26/21   Melony Overly T, PA-C  magnesium 30 MG tablet Take 30 mg by mouth daily. Patient not taking: Reported on 07/19/2021    [provider]  mirtazapine (REMERON) 30 MG tablet TAKE 1 TABLET BY MOUTH AT BEDTIME. 11/23/22   Hurst, Rosey Bath T, PA-C  Omega-3 Fatty Acids (OMEGA 3 500 PO) Take by mouth. Patient not taking: Reported on 03/07/2023    [provider]  polyethylene glycol (MIRALAX / GLYCOLAX) 17 g packet Take 17 g by mouth every other day.    [provider]  tamsulosin  (FLOMAX) 0.4 MG CAPS capsule Take 0.4 mg by mouth every evening. Every other day 01/12/19   [provider]  Turmeric (QC TUMERIC COMPLEX PO) Take by mouth. Patient not taking: Reported on 03/07/2023    [provider]    Physical Exam: Vitals:   05/20/23 1732 05/20/23 1733 05/20/23 1745  BP:   111/76  Pulse:   68  Resp:   18  SpO2: 98%  100%  Weight:  49.9 kg   Height:  5\' 6"  (1.676 m)     Physical Exam Vitals reviewed.  Constitutional:      General: He is not in acute distress. HENT:     Head: Normocephalic and atraumatic.  Eyes:     Extraocular Movements: Extraocular movements intact.  Cardiovascular:     Rate and Rhythm: Normal rate and regular rhythm.     Pulses: Normal pulses.  Pulmonary:     Effort: Pulmonary effort is normal. No respiratory distress.     Breath sounds: Normal breath sounds. No wheezing, rhonchi or rales.  Abdominal:     General: Bowel sounds are normal. There is no distension.     Palpations: Abdomen is soft.     Tenderness: There is no abdominal tenderness. There is no guarding.  Musculoskeletal:     Cervical back: Normal range of motion.     Right lower leg: No edema.     Left lower leg: No edema.     Comments: Right lower extremity shortened and externally rotated.  Neurovascularly intact.  Skin:    General: Skin is warm and dry.  Neurological:     General: No focal deficit present.     Mental Status: He is alert and oriented to person, place, and time.     Labs on Admission: I have personally reviewed following labs and imaging studies  CBC: Recent Labs  Lab 05/20/23 1811  WBC 6.9  NEUTROABS 4.8  HGB 11.9*  HCT 35.6*  MCV 92.7  PLT 232   Basic Metabolic Panel: Recent Labs  Lab 05/20/23 1811  NA 138  K 4.1  CL 102  CO2 26  GLUCOSE 106*  BUN 15  CREATININE 0.74  CALCIUM 9.1   GFR: Estimated Creatinine Clearance: 47.6 mL/min (by C-G formula based on SCr of 0.74 mg/dL). Liver Function Tests: No  results for input(s): "AST", "ALT", "ALKPHOS", "BILITOT", "PROT", "ALBUMIN" in the last 168 hours. No results for input(s): "LIPASE", "AMYLASE" in the last 168 hours. No results for input(s): "AMMONIA" in the last 168 hours. Coagulation Profile: Recent Labs  Lab 05/20/23 1811  INR 1.0   Cardiac Enzymes: No results for input(s): "CKTOTAL", "CKMB", "CKMBINDEX", "TROPONINI" in the last 168 hours. BNP (last 3 results) No results for input(s): "PROBNP" in the last 8760 hours. HbA1C: No results for input(s): "HGBA1C" in the last 72 hours. CBG: No results for input(s): "GLUCAP" in  the last 168 hours. Lipid Profile: No results for input(s): "CHOL", "HDL", "LDLCALC", "TRIG", "CHOLHDL", "LDLDIRECT" in the last 72 hours. Thyroid Function Tests: No results for input(s): "TSH", "T4TOTAL", "FREET4", "T3FREE", "THYROIDAB" in the last 72 hours. Anemia Panel: No results for input(s): "VITAMINB12", "FOLATE", "FERRITIN", "TIBC", "IRON", "RETICCTPCT" in the last 72 hours. Urine analysis:    Component Value Date/Time   COLORURINE YELLOW 06/04/2021 1322   APPEARANCEUR CLEAR 06/04/2021 1322   LABSPEC 1.006 06/04/2021 1322   PHURINE 6.0 06/04/2021 1322   GLUCOSEU NEGATIVE 06/04/2021 1322   HGBUR NEGATIVE 06/04/2021 1322   BILIRUBINUR NEGATIVE 06/04/2021 1322   KETONESUR 20 (A) 06/04/2021 1322   PROTEINUR NEGATIVE 06/04/2021 1322   NITRITE NEGATIVE 06/04/2021 1322   LEUKOCYTESUR NEGATIVE 06/04/2021 1322    Radiological Exams on Admission: No results found.  EKG: Independently reviewed.  Sinus rhythm with first-degree AV block, baseline wander.  No recent EKG for comparison.  Assessment and Plan  Right hip fracture Secondary to a mechanical fall. X-ray results not posted by radiologist yet but image reviewed by orthopedics and showing right hip displaced femoral neck fracture.  Orthopedics planning on surgery tomorrow.   Keep n.p.o. after midnight, gentle IV fluid hydration, nonweightbearing,  and pain management.  Normocytic anemia Hemoglobin 11.9 at present and was previously >14 on labs done about 2 years ago.  No recent labs for comparison.  No signs or symptoms of bleeding.  Type and screen, continue to monitor H&H.  Bronchiectasis No signs or symptoms of acute exacerbation.  Preop chest x-ray ordered.  First-degree AV block EKG showing sinus rhythm with first-degree AV block.  No recent EKG for comparison.  Continue cardiac monitoring and check TSH.  Depression and anxiety Continue home medications.  BPH Continue home medications.  DVT prophylaxis: SCDs Code Status: DNR/DNI (discussed with the patient) Family Communication: Wife at bedside. Consults called: Orthopedics Level of care: Telemetry bed Admission status: It is my clinical opinion that admission to INPATIENT is reasonable and necessary because of the expectation that this patient will require hospital care that crosses at least 2 midnights to treat this condition based on the medical complexity of the problems presented.  Given the aforementioned information, the predictability of an adverse outcome is felt to be significant.  [name] Giovanni MD [facility]  If 7PM-7AM, please contact night-coverage [email or web address]  05/20/2023, 7:35 PM

## 2023-05-20 NOTE — ED Triage Notes (Signed)
Patient BIB [facility] from home after a fall, no LOC, no thinners, no head injury. EMS reports right hip pain with external rotation, 10/10 pain when he attempts to bear weight. VSS. BP 128/62, HR 68, RR 18, 98% RA.

## 2023-05-20 NOTE — ED Notes (Signed)
ED TO INPATIENT HANDOFF REPORT  ED Nurse Name and Phone #:  Marcello Moores [phone]  S Name/Age/Gender Jeremy [name] 87 y.o. male Room/Bed: 018C/018C  Code Status   Code Status: Not on file  Home/SNF/Other Home Patient oriented to: self, place, time, and situation Is this baseline? Yes   Triage Complete: Triage complete  Chief Complaint Hip fracture [facility]) [S72.009A]  Triage Note Patient BIB [facility] from home after a fall, no LOC, no thinners, no head injury. EMS reports right hip pain with external rotation, 10/10 pain when he attempts to bear weight. VSS. BP 128/62, HR 68, RR 18, 98% RA.    Allergies Allergies  Allergen Reactions   Aspergillus Allergy Skin Test Cough   Aspirin     Irritates stomach   Sulfa Antibiotics Other (See Comments)    Childhood    Theophyllines Other (See Comments)    Does not remember    Level of Care/Admitting Diagnosis ED Disposition     ED Disposition  Admit   Condition  --   Comment  Hospital Area: [facility] [[id number]]  Level of Care: Med-Surg [16]  May admit patient to [facility] or [facility] if equivalent level of care is available:: Yes  Covid Evaluation: Asymptomatic - no recent exposure (last 10 days) testing not required  Diagnosis: Hip fracture [facility]) [[id number]]  Admitting Physician: [name] Giovanni [[id number]]  Attending Physician: [name] Giovanni [[id number]]  Certification:: I certify this patient will need inpatient services for at least 2 midnights  Expected Medical Readiness: 05/23/2023          B Medical/Surgery History Past Medical History:  Diagnosis Date   Aortic atherosclerosis (HCC)    BPH (benign prostatic hyperplasia)    Bronchiectasis (HCC)    Depression    Depression    GAD (generalized anxiety disorder)    Past Surgical History:  Procedure Laterality Date   APPENDECTOMY     HERNIA REPAIR       A IV Location/Drains/Wounds Patient Lines/Drains/Airways Status     Active  Line/Drains/Airways     Name Placement date Placement time Site Days   Peripheral IV 05/20/23 20 G 1" Anterior;Right Forearm 05/20/23  1810  Forearm  less than 1            Intake/Output Last 24 hours No intake or output data in the 24 hours ending 05/20/23 1936  Labs/Imaging Results for orders placed or performed during the hospital encounter of 05/20/23 (from the past 48 hours)  Basic metabolic panel     Status: Abnormal   Collection Time: 05/20/23  6:11 PM  Result Value Ref Range   Sodium 138 135 - 145 mmol/L   Potassium 4.1 3.5 - 5.1 mmol/L   Chloride 102 98 - 111 mmol/L   CO2 26 22 - 32 mmol/L   Glucose, Bld 106 (H) 70 - 99 mg/dL    Comment: Glucose reference range applies only to samples taken after fasting for at least 8 hours.   BUN 15 8 - 23 mg/dL   Creatinine, Ser 2.37 0.61 - 1.24 mg/dL   Calcium 9.1 8.9 - 62.8 mg/dL   GFR, Estimated >31 >51 mL/min    Comment: (NOTE) Calculated using the CKD-EPI Creatinine Equation (2021)    Anion gap 10 5 - 15    Comment: Performed at [facility], [address], [place], [place] [address]  CBC with Differential     Status: Abnormal   Collection Time: 05/20/23  6:11 PM  Result Value Ref Range   WBC 6.9 4.0 - 10.5 K/uL   RBC 3.84 (L) 4.22 - 5.81 MIL/uL   Hemoglobin 11.9 (L) 13.0 - 17.0 g/dL   HCT 65.7 (L) 84.6 - 96.2 %   MCV 92.7 80.0 - 100.0 fL   MCH 31.0 26.0 - 34.0 pg   MCHC 33.4 30.0 - 36.0 g/dL   RDW 95.2 84.1 - 32.4 %   Platelets 232 150 - 400 K/uL   nRBC 0.0 0.0 - 0.2 %   Neutrophils Relative % 69 %   Neutro Abs 4.8 1.7 - 7.7 K/uL   Lymphocytes Relative 17 %   Lymphs Abs 1.2 0.7 - 4.0 K/uL   Monocytes Relative 9 %   Monocytes Absolute 0.6 0.1 - 1.0 K/uL   Eosinophils Relative 3 %   Eosinophils Absolute 0.2 0.0 - 0.5 K/uL   Basophils Relative 1 %   Basophils Absolute 0.1 0.0 - 0.1 K/uL   Immature Granulocytes 1 %   Abs Immature Granulocytes 0.04 0.00 - 0.07 K/uL    Comment: Performed at [facility], [address] [place], [place] [address]  Protime-INR     Status: None   Collection Time: 05/20/23  6:11 PM  Result Value Ref Range   Prothrombin Time 13.3 11.4 - 15.2 seconds   INR 1.0 0.8 - 1.2    Comment: (NOTE) INR goal varies based on device and disease states. Performed at [facility], [address] [place], [place] [address]   Type and screen [facility]     Status: None   Collection Time: 05/20/23  6:11 PM  Result Value Ref Range   ABO/RH(D) O POS    Antibody Screen NEG    Sample Expiration      05/23/2023,2359 Performed at [facility], [address] [place], [place] [address]   ABO/Rh     Status: None   Collection Time: 05/20/23  6:15 PM  Result Value Ref Range   ABO/RH(D)      O POS Performed at [facility], [address], [place], [place] [address]    No results found.  Pending Labs Unresulted Labs (From admission, onward)    None       Vitals/Pain Today's Vitals   05/20/23 1732 05/20/23 1733 05/20/23 1745  BP:   111/76  Pulse:   68  Resp:   18  SpO2: 98%  100%  Weight:  49.9 kg   Height:  5\' 6"  (1.676 m)   PainSc:  10-Worst pain ever     Isolation Precautions No active isolations  Medications Medications  fentaNYL (SUBLIMAZE) injection 40 mcg (has no administration in time range)    Mobility Hip injury unable to walk at this time.     Focused Assessments     R Recommendations: See Admitting Provider Note  Report given to:   Additional Notes:

## 2023-05-20 NOTE — ED Provider Notes (Signed)
[facility] AT [facility] Provider Note   CSN: [id number] Arrival date & time: 05/20/23  1730     History  Chief Complaint  Patient presents with   Fall   Hip Pain    Jeremy [name] is a 87 y.o. male.  Patient presents with right hip pain after he slipped and fell onto that side.  No head or other injuries.  Pain with any movement of the right hip.  No history of right hip surgery or fracture.  Patient denies blood thinner use.  Wife in the room with patient.  The history is provided by the spouse and the patient.  Fall This is a new problem. Pertinent negatives include no chest pain, no abdominal pain, no headaches and no shortness of breath.  Hip Pain Pertinent negatives include no chest pain, no abdominal pain, no headaches and no shortness of breath.       Home Medications Prior to Admission medications   Medication Sig Start Date End Date Taking? Authorizing Provider  ARIPiprazole (ABILIFY) 2 MG tablet TAKE 1 TABLET BY MOUTH EVERY DAY 02/21/23  Yes Hurst, Teresa T, PA-C  clotrimazole-betamethasone (LOTRISONE) cream APPLY TO AFFECTED AREA TWICE A DAY Patient taking differently: Apply 1 Application topically daily. 05/29/22  Yes Candelaria Stagers, DPM  DULoxetine (CYMBALTA) 60 MG capsule Take 2 capsules (120 mg total) by mouth daily. 03/07/23  Yes Melony Overly T, PA-C  finasteride (PROSCAR) 5 MG tablet Take 5 mg by mouth every morning. 01/10/19  Yes [provider]  LORazepam (ATIVAN) 0.5 MG tablet Take 1/2-1 tablet twice daily as needed for severe anxiety/panic 04/26/21  Yes Melony Overly T, PA-C  mirtazapine (REMERON) 30 MG tablet TAKE 1 TABLET BY MOUTH AT BEDTIME. 11/23/22  Yes Hurst, Teresa T, PA-C  polyethylene glycol (MIRALAX / GLYCOLAX) 17 g packet Take 17 g by mouth every other day.   Yes [provider]  risperiDONE (RISPERDAL) 3 MG tablet Take 3 mg by mouth daily.   Yes [provider]  tamsulosin (FLOMAX) 0.4 MG  CAPS capsule Take 0.4 mg by mouth every evening. Every other day 01/12/19  Yes [provider]      Allergies    Aspergillus allergy skin test, Aspirin, Sulfa antibiotics, and Theophyllines    Review of Systems   Review of Systems  Constitutional:  Negative for chills and fever.  HENT:  Negative for congestion.   Eyes:  Negative for visual disturbance.  Respiratory:  Negative for shortness of breath.   Cardiovascular:  Negative for chest pain.  Gastrointestinal:  Negative for abdominal pain and vomiting.  Genitourinary:  Negative for dysuria and flank pain.  Musculoskeletal:  Positive for gait problem. Negative for back pain, neck pain and neck stiffness.  Skin:  Negative for rash.  Neurological:  Negative for light-headedness and headaches.    Physical Exam Updated Vital Signs BP 111/76   Pulse 68   Resp 18   Ht 5\' 6"  (1.676 m)   Wt 49.9 kg   SpO2 100%   BMI 17.75 kg/m  Physical Exam Vitals and nursing note reviewed.  Constitutional:      General: He is not in acute distress.    Appearance: He is well-developed.  HENT:     Head: Normocephalic and atraumatic.     Mouth/Throat:     Mouth: Mucous membranes are moist.  Eyes:     General:        Right eye: No discharge.  Left eye: No discharge.     Conjunctiva/sclera: Conjunctivae normal.  Neck:     Trachea: No tracheal deviation.  Cardiovascular:     Rate and Rhythm: Normal rate and regular rhythm.  Pulmonary:     Effort: Pulmonary effort is normal.     Breath sounds: Normal breath sounds.  Abdominal:     General: There is no distension.     Palpations: Abdomen is soft.     Tenderness: There is no abdominal tenderness. There is no guarding.  Musculoskeletal:        General: Tenderness present. No swelling.     Cervical back: Normal range of motion and neck supple. No rigidity.     Comments: Patient has mild shortened and actually rotated right leg and hip.  Pain with flexion of the hip and external  rotation.  Patient has no pain with moving other extremities.  Patient has horizontal range of motion of head neck without pain.  Skin:    General: Skin is warm.     Capillary Refill: Capillary refill takes less than 2 seconds.     Findings: No rash.  Neurological:     General: No focal deficit present.     Mental Status: He is alert.     Cranial Nerves: No cranial nerve deficit.  Psychiatric:        Mood and Affect: Mood normal.     ED Results / Procedures / Treatments   Labs (all labs ordered are listed, but only abnormal results are displayed) Labs Reviewed  BASIC METABOLIC PANEL - Abnormal; Notable for the following components:      Result Value   Glucose, Bld 106 (*)    All other components within normal limits  CBC WITH DIFFERENTIAL/PLATELET - Abnormal; Notable for the following components:   RBC 3.84 (*)    Hemoglobin 11.9 (*)    HCT 35.6 (*)    All other components within normal limits  PROTIME-INR  COMPREHENSIVE METABOLIC PANEL  CBC  TSH  TYPE AND SCREEN  ABO/RH    EKG EKG Interpretation Date/Time:  Monday May 20 2023 18:17:31 EST Ventricular Rate:  68 PR Interval:  208 QRS Duration:  74 QT Interval:  408 QTC Calculation: 434 R Axis:   78  Text Interpretation: Sinus rhythm Baseline wander Confirmed by Blane Ohara [phone]) on 05/20/2023 7:18:30 PM  Radiology Chest Portable 1 View Result Date: 05/20/2023 CLINICAL DATA:  Presents from home after a fall. Right femoral neck fracture. Preop chest exam. EXAM: PORTABLE CHEST 1 VIEW COMPARISON:  05/13/2013 FINDINGS: Stable cardiomediastinal silhouette. Aortic atherosclerotic calcification. Mild hyperinflation and chronic bronchitic change. No focal consolidation, pleural effusion, or pneumothorax. No displaced rib fractures. IMPRESSION: No acute cardiopulmonary process. Electronically Signed   By: Minerva Fester M.D.   On: 05/20/2023 22:24   DG Hip Unilat With Pelvis 2-3 Views Right Result Date:  05/20/2023 CLINICAL DATA:  Fall, hip pain EXAM: DG HIP (WITH OR WITHOUT PELVIS) 2-3V RIGHT COMPARISON:  CT 06/04/2021 FINDINGS: SI joints are non widened. Pubic symphysis and rami appear intact. Acute right femoral neck fracture. No femoral head dislocation IMPRESSION: Acute right femoral neck fracture. Electronically Signed   By: Jasmine Pang M.D.   On: 05/20/2023 20:21    Procedures Procedures    Medications Ordered in ED Medications  ARIPiprazole (ABILIFY) tablet 2 mg (has no administration in time range)  DULoxetine (CYMBALTA) DR capsule 120 mg (has no administration in time range)  mirtazapine (REMERON) tablet 30 mg (has no  administration in time range)  polyethylene glycol (MIRALAX / GLYCOLAX) packet 17 g (has no administration in time range)  finasteride (PROSCAR) tablet 5 mg (has no administration in time range)  morphine (PF) 2 MG/ML injection 0.5 mg (has no administration in time range)  acetaminophen (TYLENOL) tablet 650 mg (has no administration in time range)  methocarbamol (ROBAXIN) tablet 500 mg (has no administration in time range)    Or  methocarbamol (ROBAXIN) injection 500 mg (has no administration in time range)  0.9 %  sodium chloride infusion (has no administration in time range)  naloxone (NARCAN) injection 0.4 mg (has no administration in time range)  oxyCODONE (Oxy IR/ROXICODONE) immediate release tablet 5 mg (has no administration in time range)  tamsulosin (FLOMAX) capsule 0.4 mg (has no administration in time range)    ED Course/ Medical Decision Making/ A&P                                 Medical Decision Making Amount and/or Complexity of Data Reviewed Labs: ordered. Radiology: ordered.  Risk Prescription drug management. Decision regarding hospitalization.   Patient presents with isolated right hip injury since mechanical fall.  Patient recalls all details, pain with range of motion of right hip.  X-ray ordered independently reviewed showing  femoral neck fracture.  Discussed with orthopedics Dr Hulda Humphrey for consult.  Discussed with hospitalist for admission.  General blood work ordered independently reviewed no acute abnormalities, hemoglobin 11.9, electrolytes unremarkable.  EKG reviewed sinus rhythm.  Patient stable for admission discussed with hospitalist.        Final Clinical Impression(s) / ED Diagnoses Final diagnoses:  Closed right hip fracture, initial encounter [facility])  Fall, initial encounter    Rx / DC Orders ED Discharge Orders     None         Blane Ohara, MD 05/20/23 2240

## 2023-05-21 ENCOUNTER — Inpatient Hospital Stay (HOSPITAL_COMMUNITY): Payer: Medicare Other | Admitting: Anesthesiology

## 2023-05-21 ENCOUNTER — Inpatient Hospital Stay (HOSPITAL_COMMUNITY): Payer: Medicare Other

## 2023-05-21 ENCOUNTER — Other Ambulatory Visit: Payer: Self-pay

## 2023-05-21 ENCOUNTER — Encounter (HOSPITAL_COMMUNITY): Payer: Self-pay | Admitting: Internal Medicine

## 2023-05-21 ENCOUNTER — Other Ambulatory Visit: Payer: Self-pay | Admitting: Physician Assistant

## 2023-05-21 ENCOUNTER — Encounter (HOSPITAL_COMMUNITY)
Admission: EM | Disposition: A | Discharge status: Rehabilitation Facility | Payer: Self-pay | Source: Home / Self Care | Attending: Student

## 2023-05-21 DIAGNOSIS — S72011A Unspecified intracapsular fracture of right femur, initial encounter for closed fracture: Secondary | ICD-10-CM | POA: Diagnosis not present

## 2023-05-21 DIAGNOSIS — S72001A Fracture of unspecified part of neck of right femur, initial encounter for closed fracture: Secondary | ICD-10-CM

## 2023-05-21 HISTORY — PX: TOTAL HIP ARTHROPLASTY: SHX124

## 2023-05-21 LAB — COMPREHENSIVE METABOLIC PANEL
ALT: 16 U/L (ref 0–44)
AST: 20 U/L (ref 15–41)
Albumin: 3.1 g/dL — ABNORMAL LOW (ref 3.5–5.0)
Alkaline Phosphatase: 48 U/L (ref 38–126)
Anion gap: 7 (ref 5–15)
BUN: 14 mg/dL (ref 8–23)
CO2: 23 mmol/L (ref 22–32)
Calcium: 8.6 mg/dL — ABNORMAL LOW (ref 8.9–10.3)
Chloride: 106 mmol/L (ref 98–111)
Creatinine, Ser: 0.79 mg/dL (ref 0.61–1.24)
GFR, Estimated: >60 mL/min (ref >60)
Glucose, Bld: 131 mg/dL — ABNORMAL HIGH (ref 70–99)
Potassium: 3.9 mmol/L (ref 3.5–5.1)
Sodium: 136 mmol/L (ref 135–145)
Total Bilirubin: 0.6 mg/dL (ref <1.2)
Total Protein: 5.6 g/dL — ABNORMAL LOW (ref 6.5–8.1)

## 2023-05-21 LAB — CBC
HCT: 33.1 % — ABNORMAL LOW (ref 39.0–52.0)
Hemoglobin: 11.1 g/dL — ABNORMAL LOW (ref 13.0–17.0)
MCH: 31.1 pg (ref 26.0–34.0)
MCHC: 33.5 g/dL (ref 30.0–36.0)
MCV: 92.7 fL (ref 80.0–100.0)
Platelets: 214 10*3/uL (ref 150–400)
RBC: 3.57 MIL/uL — ABNORMAL LOW (ref 4.22–5.81)
RDW: 12.1 % (ref 11.5–15.5)
WBC: 9.9 10*3/uL (ref 4.0–10.5)
nRBC: 0 % (ref 0.0–0.2)

## 2023-05-21 LAB — SURGICAL PCR SCREEN
MRSA, PCR: NEGATIVE
Staphylococcus aureus: NEGATIVE

## 2023-05-21 LAB — TSH: TSH: 3.834 u[IU]/mL (ref 0.350–4.500)

## 2023-05-21 SURGERY — ARTHROPLASTY, HIP, TOTAL, ANTERIOR APPROACH
Anesthesia: Monitor Anesthesia Care | Site: Hip | Laterality: Right

## 2023-05-21 MED ORDER — ASPIRIN 81 MG PO CHEW
81.0000 mg | CHEWABLE_TABLET | Freq: Two times a day (BID) | ORAL | Status: DC
  Administered 2023-05-21 – 2023-05-23 (×3): 81 mg via ORAL
  Filled 2023-05-21 (×4): qty 1

## 2023-05-21 MED ORDER — ACETAMINOPHEN 325 MG PO TABS: 325.0000 mg | ORAL_TABLET | Freq: Four times a day (QID) | ORAL | Status: DC | PRN

## 2023-05-21 MED ORDER — TRANEXAMIC ACID-NACL 1000-0.7 MG/100ML-% IV SOLN
1000.0000 mg | INTRAVENOUS | Status: AC
  Administered 2023-05-21: 1000 mg via INTRAVENOUS

## 2023-05-21 MED ORDER — ACETAMINOPHEN 325 MG PO TABS
650.0000 mg | ORAL_TABLET | Freq: Four times a day (QID) | ORAL | Status: DC
  Administered 2023-05-21 – 2023-05-23 (×7): 650 mg via ORAL
  Filled 2023-05-21 (×8): qty 2

## 2023-05-21 MED ORDER — RISPERIDONE 3 MG PO TABS
3.0000 mg | ORAL_TABLET | Freq: Every day | ORAL | Status: DC
  Filled 2023-05-21: qty 1

## 2023-05-21 MED ORDER — ONDANSETRON HCL 4 MG/2ML IJ SOLN
INTRAMUSCULAR | Status: AC
  Filled 2023-05-21: qty 2

## 2023-05-21 MED ORDER — OXYCODONE HCL 5 MG PO TABS
10.0000 mg | ORAL_TABLET | ORAL | Status: DC | PRN
  Administered 2023-05-21: 10 mg via ORAL
  Administered 2023-05-22: 15 mg via ORAL
  Filled 2023-05-21: qty 3
  Filled 2023-05-21: qty 2
  Filled 2023-05-21: qty 3

## 2023-05-21 MED ORDER — SODIUM CHLORIDE 0.9 % IV SOLN: INTRAVENOUS | Status: DC

## 2023-05-21 MED ORDER — SODIUM CHLORIDE 0.9 % IR SOLN
Status: DC | PRN
  Administered 2023-05-21: 1000 mL

## 2023-05-21 MED ORDER — ONDANSETRON HCL 4 MG PO TABS: 4.0000 mg | ORAL_TABLET | Freq: Four times a day (QID) | ORAL | Status: DC | PRN

## 2023-05-21 MED ORDER — ONDANSETRON HCL 4 MG/2ML IJ SOLN
INTRAMUSCULAR | Status: DC | PRN
  Administered 2023-05-21: 4 mg via INTRAVENOUS

## 2023-05-21 MED ORDER — POVIDONE-IODINE 10 % EX SWAB
2.0000 | Freq: Once | CUTANEOUS | Status: AC
  Administered 2023-05-21: 2 via TOPICAL

## 2023-05-21 MED ORDER — EPHEDRINE SULFATE-NACL 50-0.9 MG/10ML-% IV SOSY
PREFILLED_SYRINGE | INTRAVENOUS | Status: DC | PRN
  Administered 2023-05-21: 5 mg via INTRAVENOUS
  Administered 2023-05-21: 10 mg via INTRAVENOUS

## 2023-05-21 MED ORDER — CHLORHEXIDINE GLUCONATE 0.12 % MT SOLN
OROMUCOSAL | Status: AC
  Administered 2023-05-21: 15 mL via OROMUCOSAL
  Filled 2023-05-21: qty 15

## 2023-05-21 MED ORDER — OXYCODONE HCL 5 MG PO TABS
5.0000 mg | ORAL_TABLET | ORAL | Status: DC | PRN
  Administered 2023-05-22: 5 mg via ORAL
  Administered 2023-05-23: 10 mg via ORAL
  Filled 2023-05-21: qty 2
  Filled 2023-05-21: qty 1

## 2023-05-21 MED ORDER — CEFAZOLIN SODIUM-DEXTROSE 2-4 GM/100ML-% IV SOLN
2.0000 g | INTRAVENOUS | Status: AC
  Administered 2023-05-21: 2 g via INTRAVENOUS
  Filled 2023-05-21: qty 100

## 2023-05-21 MED ORDER — FENTANYL CITRATE (PF) 100 MCG/2ML IJ SOLN: 25.0000 ug | INTRAMUSCULAR | Status: DC | PRN

## 2023-05-21 MED ORDER — MENTHOL 3 MG MT LOZG: 1.0000 | LOZENGE | OROMUCOSAL | Status: DC | PRN

## 2023-05-21 MED ORDER — PROPOFOL 500 MG/50ML IV EMUL
INTRAVENOUS | Status: DC | PRN
  Administered 2023-05-21: 25 ug/kg/min via INTRAVENOUS

## 2023-05-21 MED ORDER — ACETAMINOPHEN 500 MG PO TABS: 1000.0000 mg | ORAL_TABLET | Freq: Once | ORAL | Status: DC

## 2023-05-21 MED ORDER — DOCUSATE SODIUM 100 MG PO CAPS
100.0000 mg | ORAL_CAPSULE | Freq: Two times a day (BID) | ORAL | Status: DC
  Administered 2023-05-21 – 2023-05-23 (×3): 100 mg via ORAL
  Filled 2023-05-21 (×4): qty 1

## 2023-05-21 MED ORDER — METOCLOPRAMIDE HCL 5 MG/ML IJ SOLN: 5.0000 mg | Freq: Three times a day (TID) | INTRAMUSCULAR | Status: DC | PRN

## 2023-05-21 MED ORDER — CEFAZOLIN SODIUM-DEXTROSE 2-4 GM/100ML-% IV SOLN
2.0000 g | Freq: Four times a day (QID) | INTRAVENOUS | Status: AC
  Administered 2023-05-21 – 2023-05-22 (×2): 2 g via INTRAVENOUS
  Filled 2023-05-21 (×2): qty 100

## 2023-05-21 MED ORDER — FENTANYL CITRATE (PF) 250 MCG/5ML IJ SOLN
INTRAMUSCULAR | Status: AC
  Filled 2023-05-21: qty 5

## 2023-05-21 MED ORDER — PROPOFOL 10 MG/ML IV BOLUS
INTRAVENOUS | Status: DC | PRN
  Administered 2023-05-21: 20 mg via INTRAVENOUS

## 2023-05-21 MED ORDER — PHENOL 1.4 % MT LIQD: 1.0000 | OROMUCOSAL | Status: DC | PRN

## 2023-05-21 MED ORDER — 0.9 % SODIUM CHLORIDE (POUR BTL) OPTIME
TOPICAL | Status: DC | PRN
  Administered 2023-05-21: 1000 mL

## 2023-05-21 MED ORDER — ONDANSETRON HCL 4 MG/2ML IJ SOLN: 4.0000 mg | Freq: Once | INTRAMUSCULAR | Status: DC | PRN

## 2023-05-21 MED ORDER — ONDANSETRON HCL 4 MG/2ML IJ SOLN
4.0000 mg | Freq: Four times a day (QID) | INTRAMUSCULAR | Status: DC | PRN
Start: 2023-05-21 — End: 2023-05-23

## 2023-05-21 MED ORDER — METOCLOPRAMIDE HCL 5 MG PO TABS
5.0000 mg | ORAL_TABLET | Freq: Three times a day (TID) | ORAL | Status: DC | PRN
Start: 2023-05-21 — End: 2023-05-21

## 2023-05-21 MED ORDER — ARIPIPRAZOLE 2 MG PO TABS
2.0000 mg | ORAL_TABLET | Freq: Every day | ORAL | Status: DC
  Filled 2023-05-21 (×2): qty 1

## 2023-05-21 MED ORDER — HYDROMORPHONE HCL 1 MG/ML IJ SOLN
0.5000 mg | INTRAMUSCULAR | Status: DC | PRN
  Administered 2023-05-21: 0.5 mg via INTRAVENOUS
  Filled 2023-05-21: qty 1

## 2023-05-21 MED ORDER — CHLORHEXIDINE GLUCONATE CLOTH 2 % EX PADS
6.0000 | MEDICATED_PAD | Freq: Every day | CUTANEOUS | Status: DC
  Administered 2023-05-21 – 2023-05-23 (×3): 6 via TOPICAL

## 2023-05-21 MED ORDER — CHLORHEXIDINE GLUCONATE 4 % EX SOLN: 60.0000 mL | Freq: Once | CUTANEOUS | Status: DC

## 2023-05-21 MED ORDER — SENNA 8.6 MG PO TABS
1.0000 | ORAL_TABLET | Freq: Every day | ORAL | Status: DC
  Administered 2023-05-22 – 2023-05-23 (×2): 8.6 mg via ORAL
  Filled 2023-05-21 (×2): qty 1

## 2023-05-21 MED ORDER — TRANEXAMIC ACID-NACL 1000-0.7 MG/100ML-% IV SOLN
INTRAVENOUS | Status: AC
  Filled 2023-05-21: qty 100

## 2023-05-21 MED ORDER — LIDOCAINE 2% (20 MG/ML) 5 ML SYRINGE
INTRAMUSCULAR | Status: AC
  Filled 2023-05-21: qty 5

## 2023-05-21 MED ORDER — PHENYLEPHRINE HCL-NACL 20-0.9 MG/250ML-% IV SOLN
INTRAVENOUS | Status: DC | PRN
  Administered 2023-05-21: 30 ug/min via INTRAVENOUS

## 2023-05-21 MED ORDER — FENTANYL CITRATE (PF) 250 MCG/5ML IJ SOLN
INTRAMUSCULAR | Status: DC | PRN
  Administered 2023-05-21: 50 ug via INTRAVENOUS

## 2023-05-21 MED ORDER — ORAL CARE MOUTH RINSE: 15.0000 mL | Freq: Once | OROMUCOSAL | Status: AC

## 2023-05-21 MED ORDER — EPHEDRINE 5 MG/ML INJ
INTRAVENOUS | Status: AC
  Filled 2023-05-21: qty 5

## 2023-05-21 MED ORDER — CHLORHEXIDINE GLUCONATE 0.12 % MT SOLN: 15.0000 mL | Freq: Once | OROMUCOSAL | Status: AC

## 2023-05-21 MED ORDER — BUPIVACAINE IN DEXTROSE 0.75-8.25 % IT SOLN
INTRATHECAL | Status: DC | PRN
  Administered 2023-05-21: 1.8 mL via INTRATHECAL

## 2023-05-21 SURGICAL SUPPLY — 47 items
BAG COUNTER SPONGE SURGICOUNT (BAG) ×1 IMPLANT
BENZOIN TINCTURE PRP APPL 2/3 (GAUZE/BANDAGES/DRESSINGS) ×1 IMPLANT
BLADE CLIPPER SURG (BLADE) IMPLANT
BLADE SAW SGTL 18X1.27X75 (BLADE) ×1 IMPLANT
COVER SURGICAL LIGHT HANDLE (MISCELLANEOUS) ×1 IMPLANT
CUP ACET PNNCL SECTR W/GRIP 56 (Hips) IMPLANT
DRAPE C-ARM 42X72 X-RAY (DRAPES) ×1 IMPLANT
DRAPE STERI IOBAN 125X83 (DRAPES) ×1 IMPLANT
DRAPE U-SHAPE 47X51 STRL (DRAPES) ×3 IMPLANT
DRSG AQUACEL AG ADV 3.5X10 (GAUZE/BANDAGES/DRESSINGS) ×1 IMPLANT
DURAPREP 26ML APPLICATOR (WOUND CARE) ×1 IMPLANT
ELECT BLADE 4.0 EZ CLEAN MEGAD (MISCELLANEOUS) ×1 IMPLANT
ELECT BLADE 6.5 EXT (BLADE) IMPLANT
ELECT REM PT RETURN 9FT ADLT (ELECTROSURGICAL) ×1 IMPLANT
ELECTRODE BLDE 4.0 EZ CLN MEGD (MISCELLANEOUS) ×1 IMPLANT
ELECTRODE REM PT RTRN 9FT ADLT (ELECTROSURGICAL) ×1 IMPLANT
FACESHIELD WRAPAROUND (MASK) ×2 IMPLANT
FACESHIELD WRAPAROUND OR TEAM (MASK) ×2 IMPLANT
GLOVE BIOGEL PI IND STRL 8 (GLOVE) ×2 IMPLANT
GLOVE ECLIPSE 8.0 STRL XLNG CF (GLOVE) ×1 IMPLANT
GLOVE ORTHO TXT STRL SZ7.5 (GLOVE) ×2 IMPLANT
GOWN STRL REUS W/ TWL LRG LVL3 (GOWN DISPOSABLE) ×2 IMPLANT
GOWN STRL REUS W/ TWL XL LVL3 (GOWN DISPOSABLE) ×2 IMPLANT
HEAD M SROM 36MM PLUS 1.5 (Hips) IMPLANT
KIT BASIN OR (CUSTOM PROCEDURE TRAY) ×1 IMPLANT
KIT TURNOVER KIT B (KITS) ×1 IMPLANT
MANIFOLD NEPTUNE II (INSTRUMENTS) ×1 IMPLANT
NS IRRIG 1000ML POUR BTL (IV SOLUTION) ×1 IMPLANT
PACK TOTAL JOINT (CUSTOM PROCEDURE TRAY) ×1 IMPLANT
PAD ARMBOARD 7.5X6 YLW CONV (MISCELLANEOUS) ×1 IMPLANT
PINN SECTOR W/GRIP ACE CUP 56 (Hips) ×1 IMPLANT
PINNACLE ALTRX PLUS 4 N 36X56 (Hips) IMPLANT
SET HNDPC FAN SPRY TIP SCT (DISPOSABLE) ×1 IMPLANT
SROM M HEAD 36MM PLUS 1.5 (Hips) ×1 IMPLANT
STAPLER VISISTAT 35W (STAPLE) IMPLANT
STEM FEMORAL SZ 6MM STD ACTIS (Stem) IMPLANT
STRIP CLOSURE SKIN 1/2X4 (GAUZE/BANDAGES/DRESSINGS) ×2 IMPLANT
SUT ETHIBOND NAB CT1 #1 30IN (SUTURE) ×1 IMPLANT
SUT MNCRL AB 4-0 PS2 18 (SUTURE) IMPLANT
SUT VIC AB 0 CT1 27XBRD ANBCTR (SUTURE) ×1 IMPLANT
SUT VIC AB 1 CT1 27XBRD ANBCTR (SUTURE) ×1 IMPLANT
SUT VIC AB 2-0 CT1 TAPERPNT 27 (SUTURE) ×1 IMPLANT
TOWEL GREEN STERILE (TOWEL DISPOSABLE) ×1 IMPLANT
TOWEL GREEN STERILE FF (TOWEL DISPOSABLE) ×1 IMPLANT
TRAY CATH INTERMITTENT SS 16FR (CATHETERS) IMPLANT
TRAY FOLEY W/BAG SLVR 16FR ST (SET/KITS/TRAYS/PACK) IMPLANT
WATER STERILE IRR 1000ML POUR (IV SOLUTION) ×2 IMPLANT

## 2023-05-21 NOTE — Evaluation (Signed)
Physical Therapy Evaluation Patient Details Name: Jeremy [name] MRN: [id number] DOB: 02-16-1936 Today's Date: 05/21/2023  History of Present Illness  87 y.o. male admitted 05/20/23 after fall sustaining R femoral neck fx. S/p R THA (direct anterior) on 12/17. PMH includes BPH, anxiety, depression.   Clinical Impression  Pt presents with an overall decrease in functional mobility secondary to above. PTA, pt indep household ambulator without DME, lives with wife who assists with ADL/iADLs as needed; pt admits his severe depression keeps him in bed majority of the day though he was a competitive runner in 2022 [employer]. Educ re: precautions, positioning, therex, and importance of mobility. Today, pt able to initiate transfer and gait training with RW and modA. Pt limited by post-op pain and weakness, decreased activity tolerance and impaired balance strategies/postural reactions. Pt would benefit from intensive post-acute rehab services (> 3 hrs/day) to maximize functional mobility and independence prior to d/c home. If AIR not an option, need to consider SNF. Will follow acutely to address established goals.       If plan is discharge home, recommend the following: A lot of help with walking and/or transfers;A lot of help with bathing/dressing/bathroom;Assistance with cooking/housework;Assist for transportation;Help with stairs or ramp for entrance   Can travel by private vehicle    Yes    Equipment Recommendations  (TBD)  Recommendations for Other Services   Occupational Therapy    Functional Status Assessment Patient has had a recent decline in their functional status and demonstrates the ability to make significant improvements in function in a reasonable and predictable amount of time.     Precautions / Restrictions Precautions Precautions: Fall Restrictions Weight Bearing Restrictions Per Provider Order: Yes RLE Weight Bearing Per Provider Order: Weight bearing as tolerated       Mobility  Bed Mobility Overal bed mobility: Needs Assistance Bed Mobility: Supine to Sit     Supine to sit: Mod assist, HOB elevated, Used rails     General bed mobility comments: modA for RLE management and scooting R hip to EOB    Transfers Overall transfer level: Needs assistance Equipment used: Rolling walker (2 wheels) Transfers: Sit to/from Stand, Bed to chair/wheelchair/BSC Sit to Stand: Min assist   Step pivot transfers: Min assist, Mod assist       General transfer comment: minA for trunk elevation and stability, min-modA for RW management with step pivot from bed to recliner. pt with difficulty accepting weight onto RLE in order to take full steps with L foot resulting in shuffling steps    Ambulation/Gait                  Stairs            Wheelchair Mobility     Tilt Bed    Modified Rankin (Stroke Patients Only)       Balance Overall balance assessment: Needs assistance Sitting-balance support: No upper extremity supported, Feet supported Sitting balance-Leahy Scale: Fair     Standing balance support: Reliant on assistive device for balance Standing balance-Leahy Scale: Poor                               Pertinent Vitals/Pain Pain Assessment Pain Assessment: Faces Pain Score: 6  Pain Location: R hip Pain Descriptors / Indicators: Discomfort, Grimacing, Guarding Pain Intervention(s): Monitored during session, Limited activity within patient's tolerance, Repositioned    Home Living Family/patient expects to be discharged to:: Private residence  Living Arrangements: Spouse/significant other Available Help at Discharge: Family;Available 24 hours/day Type of Home: House Home Access: Stairs to enter Entrance Stairs-Rails: Right Entrance Stairs-Number of Steps: 2-3   Home Layout: Able to live on main level with bedroom/bathroom Home Equipment: Rollator (4 wheels)      Prior Function Prior Level of Function : Needs  assist             Mobility Comments: reports h/o significant depression results in him staying in bed most of day; able to walk household distances indep without DME. reports 4 falls in the past ~4 months ADLs Comments: wife assists with dressing and bathing; pt reports indep with toileting. wife performs majority of iADLs; wife drives     Extremity/Trunk Assessment   Upper Extremity Assessment Upper Extremity Assessment: RUE deficits/detail;LUE deficits/detail RUE Deficits / Details: functional observed strength >3/5; noted decreased grip strength, arthritic changes bilateral fingers, tremulous BUEs (pt states "the tremors started with the depression") LUE Deficits / Details: functional observed strength >3/5; noted decreased grip strength, arthritic changes bilateral fingers, tremulous BUEs (pt states "the tremors started with the depression")    Lower Extremity Assessment Lower Extremity Assessment: RLE deficits/detail RLE Deficits / Details: femoral fx s/p R THA; able to perform full range LAQ, gross hip strength < 3/5 with expected post-op pain and weakness, though able to [employer]       Communication   Communication Communication: No apparent difficulties  Cognition Arousal: Alert Behavior During Therapy: Flat affect Overall Cognitive Status: Within Functional Limits for tasks assessed                                          General Comments General comments (skin integrity, edema, etc.): pt's wife present at end of session, supportive. initiated educ re: POC, precautions, activity recommendations, therex/AROM, importance of OOB mobility, discharge needs including recommendation for post-acute rehab    Exercises General Exercises - Lower Extremity Ankle Circles/Pumps: AROM, Both, Seated Long Arc Quad: AROM, Both, Seated   Assessment/Plan    PT Assessment Patient needs continued PT services  PT Problem List Decreased strength;Decreased range of  motion;Decreased activity tolerance;Decreased balance;Decreased mobility;Decreased coordination;Decreased knowledge of use of DME;Decreased knowledge of precautions;Pain       PT Treatment Interventions DME instruction;Gait training;Stair training;Functional mobility training;Therapeutic exercise;Therapeutic activities;Balance training;Patient/family education    PT Goals (Current goals can be found in the Care Plan section)  Acute Rehab PT Goals Patient Stated Goal: regain mobility, return home PT Goal Formulation: With patient/family Time For Goal Achievement: 06/04/23 Potential to Achieve Goals: Good    Frequency Min 1X/week     Co-evaluation               AM-PAC PT "6 Clicks" Mobility  Outcome Measure Help needed turning from your back to your side while in a flat bed without using bedrails?: A Little Help needed moving from lying on your back to sitting on the side of a flat bed without using bedrails?: A Lot Help needed moving to and from a bed to a chair (including a wheelchair)?: A Lot Help needed standing up from a chair using your arms (e.g., wheelchair or bedside chair)?: A Little Help needed to walk in hospital room?: Total Help needed climbing 3-5 steps with a railing? : Total 6 Click Score: 12    End of Session Equipment Utilized During Treatment: Gait belt Activity  Tolerance: Patient tolerated treatment well;Patient limited by pain Patient left: in chair;with call bell/phone within reach;with chair alarm set Nurse Communication: Mobility status PT Visit Diagnosis: Other abnormalities of gait and mobility (R26.89);Muscle weakness (generalized) (M62.81);Pain Pain - Right/Left: Right Pain - part of body: Hip    Time: 4132-4401 PT Time Calculation (min) (ACUTE ONLY): 33 min   Charges:   PT Evaluation $PT Eval Low Complexity: 1 Low PT Treatments $Therapeutic Activity: 8-22 mins PT General Charges $$ ACUTE PT VISIT: 1 Visit       Ina Homes, PT,  DPT Acute Rehabilitation Services  Personal: Secure Chat Rehab Office: [phone]  Malachy Chamber 05/21/2023, 6:14 PM

## 2023-05-21 NOTE — Consult Note (Signed)
Reason for Consult:Right hip fx Referring Physician: Floyde Parkins Time called: 0730 Time at bedside: 0850   Jeremy [name] is an 87 y.o. male.  HPI: Keane slipped and fell at home. He had immediate right hip pain and could not get up. He was brought to the ED where x-rays showed a right hip fx and orthopedic surgery was consulted. He lives at home with his wife and does not use any assistive devices to ambulate.  Past Medical History:  Diagnosis Date   Aortic atherosclerosis (HCC)    BPH (benign prostatic hyperplasia)    Bronchiectasis (HCC)    Depression    Depression    GAD (generalized anxiety disorder)     Past Surgical History:  Procedure Laterality Date   APPENDECTOMY     HERNIA REPAIR      Family History  Problem Relation Age of Onset   Diabetes Mother    Colon cancer Neg Hx    Stomach cancer Neg Hx     Social History:  reports that he has never smoked. He has never used smokeless tobacco. He reports that he does not currently use alcohol after a past usage of about 4.0 standard drinks of alcohol per week. He reports that he does not use drugs.  Allergies:  Allergies  Allergen Reactions   Aspergillus Allergy Skin Test Cough   Aspirin     Irritates stomach   Sulfa Antibiotics Other (See Comments)    Childhood    Theophyllines Other (See Comments)    Does not remember    Medications: I have reviewed the patient's current medications.  Results for orders placed or performed during the hospital encounter of 05/20/23 (from the past 48 hours)  Basic metabolic panel     Status: Abnormal   Collection Time: 05/20/23  6:11 PM  Result Value Ref Range   Sodium 138 135 - 145 mmol/L   Potassium 4.1 3.5 - 5.1 mmol/L   Chloride 102 98 - 111 mmol/L   CO2 26 22 - 32 mmol/L   Glucose, Bld 106 (H) 70 - 99 mg/dL    Comment: Glucose reference range applies only to samples taken after fasting for at least 8 hours.   BUN 15 8 - 23 mg/dL   Creatinine, Ser 1.61 0.61 - 1.24 mg/dL    Calcium 9.1 8.9 - 09.6 mg/dL   GFR, Estimated >04 >54 mL/min    Comment: (NOTE) Calculated using the CKD-EPI Creatinine Equation (2021)    Anion gap 10 5 - 15    Comment: Performed at [facility], [address] [place], [place] [address]  CBC with Differential     Status: Abnormal   Collection Time: 05/20/23  6:11 PM  Result Value Ref Range   WBC 6.9 4.0 - 10.5 K/uL   RBC 3.84 (L) 4.22 - 5.81 MIL/uL   Hemoglobin 11.9 (L) 13.0 - 17.0 g/dL   HCT 91.4 (L) 78.2 - 95.6 %   MCV 92.7 80.0 - 100.0 fL   MCH 31.0 26.0 - 34.0 pg   MCHC 33.4 30.0 - 36.0 g/dL   RDW 21.3 08.6 - 57.8 %   Platelets 232 150 - 400 K/uL   nRBC 0.0 0.0 - 0.2 %   Neutrophils Relative % 69 %   Neutro Abs 4.8 1.7 - 7.7 K/uL   Lymphocytes Relative 17 %   Lymphs Abs 1.2 0.7 - 4.0 K/uL   Monocytes Relative 9 %   Monocytes Absolute 0.6 0.1 - 1.0 K/uL  Eosinophils Relative 3 %   Eosinophils Absolute 0.2 0.0 - 0.5 K/uL   Basophils Relative 1 %   Basophils Absolute 0.1 0.0 - 0.1 K/uL   Immature Granulocytes 1 %   Abs Immature Granulocytes 0.04 0.00 - 0.07 K/uL    Comment: Performed at [facility], [address] [place], [place] [address]  Protime-INR     Status: None   Collection Time: 05/20/23  6:11 PM  Result Value Ref Range   Prothrombin Time 13.3 11.4 - 15.2 seconds   INR 1.0 0.8 - 1.2    Comment: (NOTE) INR goal varies based on device and disease states. Performed at [facility], [address], [place], [place] [address]   Type and screen [facility]     Status: None   Collection Time: 05/20/23  6:11 PM  Result Value Ref Range   ABO/RH(D) O POS    Antibody Screen NEG    Sample Expiration      05/23/2023,2359 Performed at [facility], [address] [place], [place] [address]   ABO/Rh     Status: None   Collection Time: 05/20/23  6:15 PM  Result Value Ref Range   ABO/RH(D)      O POS Performed at [facility], [address], [place],  [place] [address]   Comprehensive metabolic panel     Status: Abnormal   Collection Time: 05/21/23  3:50 AM  Result Value Ref Range   Sodium 136 135 - 145 mmol/L   Potassium 3.9 3.5 - 5.1 mmol/L   Chloride 106 98 - 111 mmol/L   CO2 23 22 - 32 mmol/L   Glucose, Bld 131 (H) 70 - 99 mg/dL    Comment: Glucose reference range applies only to samples taken after fasting for at least 8 hours.   BUN 14 8 - 23 mg/dL   Creatinine, Ser 7.56 0.61 - 1.24 mg/dL   Calcium 8.6 (L) 8.9 - 10.3 mg/dL   Total Protein 5.6 (L) 6.5 - 8.1 g/dL   Albumin 3.1 (L) 3.5 - 5.0 g/dL   AST 20 15 - 41 U/L   ALT 16 0 - 44 U/L   Alkaline Phosphatase 48 38 - 126 U/L   Total Bilirubin 0.6 <1.2 mg/dL   GFR, Estimated >43 >32 mL/min    Comment: (NOTE) Calculated using the CKD-EPI Creatinine Equation (2021)    Anion gap 7 5 - 15    Comment: Performed at [facility], [address], [place], [place] [address]  CBC     Status: Abnormal   Collection Time: 05/21/23  3:50 AM  Result Value Ref Range   WBC 9.9 4.0 - 10.5 K/uL   RBC 3.57 (L) 4.22 - 5.81 MIL/uL   Hemoglobin 11.1 (L) 13.0 - 17.0 g/dL   HCT 41.6 (L) 60.6 - 30.1 %   MCV 92.7 80.0 - 100.0 fL   MCH 31.1 26.0 - 34.0 pg   MCHC 33.5 30.0 - 36.0 g/dL   RDW 60.1 09.3 - 23.5 %   Platelets 214 150 - 400 K/uL   nRBC 0.0 0.0 - 0.2 %    Comment: Performed at [facility], [address], [place], [place] [address]  TSH     Status: None   Collection Time: 05/21/23  3:50 AM  Result Value Ref Range   TSH 3.834 0.350 - 4.500 uIU/mL    Comment: Performed by a 3rd Generation  assay with a functional sensitivity of <=0.01 uIU/mL. Performed at [facility], [address], [place], [place] [address]     Chest Portable 1 View Result Date: 05/20/2023 CLINICAL DATA:  Presents from home after a fall. Right femoral neck fracture. Preop chest exam. EXAM: PORTABLE CHEST 1 VIEW COMPARISON:  05/13/2013 FINDINGS: Stable cardiomediastinal silhouette. Aortic  atherosclerotic calcification. Mild hyperinflation and chronic bronchitic change. No focal consolidation, pleural effusion, or pneumothorax. No displaced rib fractures. IMPRESSION: No acute cardiopulmonary process. Electronically Signed   By: Minerva Fester M.D.   On: 05/20/2023 22:24   DG Hip Unilat With Pelvis 2-3 Views Right Result Date: 05/20/2023 CLINICAL DATA:  Fall, hip pain EXAM: DG HIP (WITH OR WITHOUT PELVIS) 2-3V RIGHT COMPARISON:  CT 06/04/2021 FINDINGS: SI joints are non widened. Pubic symphysis and rami appear intact. Acute right femoral neck fracture. No femoral head dislocation IMPRESSION: Acute right femoral neck fracture. Electronically Signed   By: Jasmine Pang M.D.   On: 05/20/2023 20:21    Review of Systems  HENT:  Negative for ear discharge, ear pain, hearing loss and tinnitus.   Eyes:  Negative for photophobia and pain.  Respiratory:  Negative for cough and shortness of breath.   Cardiovascular:  Negative for chest pain.  Gastrointestinal:  Negative for abdominal pain, nausea and vomiting.  Genitourinary:  Negative for dysuria, flank pain, frequency and urgency.  Musculoskeletal:  Positive for arthralgias (Right hip). Negative for back pain, myalgias and neck pain.  Neurological:  Negative for dizziness and headaches.  Hematological:  Does not bruise/bleed easily.  Psychiatric/Behavioral:  The patient is not nervous/anxious.    Blood pressure (!) 108/59, pulse 68, temperature (!) 97.5 F (36.4 C), temperature source Oral, resp. rate (!) 26, height 5\' 6"  (1.676 m), weight 49.9 kg, SpO2 99%. Physical Exam Constitutional:      General: He is not in acute distress.    Appearance: He is well-developed. He is not diaphoretic.  HENT:     Head: Normocephalic and atraumatic.  Eyes:     General: No scleral icterus.       Right eye: No discharge.        Left eye: No discharge.     Conjunctiva/sclera: Conjunctivae normal.  Cardiovascular:     Rate and Rhythm: Normal rate  and regular rhythm.  Pulmonary:     Effort: Pulmonary effort is normal. No respiratory distress.  Musculoskeletal:     Cervical back: Normal range of motion.     Comments: RLE No traumatic wounds, ecchymosis, or rash  Mod TTP hip  No knee or ankle effusion  Knee stable to varus/ valgus and anterior/posterior stress  Sens DPN, SPN, TN intact  Motor EHL, ext, flex, evers 5/5  DP 1+, PT 1+, No significant edema  Skin:    General: Skin is warm and dry.  Neurological:     Mental Status: He is alert.  Psychiatric:        Mood and Affect: Mood normal.        Behavior: Behavior normal.     Assessment/Plan: Right hip fx -- Plan THA today with Dr. Magnus Ivan. Please keep NPO. Multiple medical problems including aortic atherosclerosis, BPH, bronchiectasis, depression, and anxiety -- per primary service    Freeman Caldron, PA-C Orthopedic Surgery [phone] 05/21/2023, 9:19 AM

## 2023-05-21 NOTE — Transfer of Care (Signed)
Immediate Anesthesia Transfer of Care Note  Patient: Jeremy [name]  Procedure(s) Performed: RIGHT TOTAL HIP ARTHROPLASTY (Right: Hip)  Patient Location: PACU  Anesthesia Type:MAC and Spinal  Level of Consciousness: awake, alert , and oriented  Airway & Oxygen Therapy: Patient Spontanous Breathing and Patient connected to face mask oxygen  Post-op Assessment: Report given to RN and Post -op Vital signs reviewed and stable  Post vital signs: Reviewed and stable  Last Vitals:  Vitals Value Taken Time  BP    Temp    Pulse    Resp    SpO2      Last Pain:  Vitals:   05/21/23 1140  TempSrc: Oral  PainSc: 7       Patients Stated Pain Goal: 2 (05/21/23 1140)  Complications: No notable events documented.

## 2023-05-21 NOTE — Anesthesia Postprocedure Evaluation (Signed)
Anesthesia Post Note  Patient: Jeremy [name]  Procedure(s) Performed: RIGHT TOTAL HIP ARTHROPLASTY (Right: Hip)     Patient location during evaluation: PACU Anesthesia Type: MAC and Spinal Level of consciousness: awake and alert and oriented Pain management: pain level controlled Vital Signs Assessment: post-procedure vital signs reviewed and stable Respiratory status: spontaneous breathing, nonlabored ventilation and respiratory function stable Cardiovascular status: blood pressure returned to baseline and stable Postop Assessment: no headache, no backache, spinal receding and no apparent nausea or vomiting Anesthetic complications: no   There were no known notable events for this encounter.  Last Vitals:  Vitals:   05/21/23 1400 05/21/23 1415  BP: (!) 108/49 (!) 107/49  Pulse: 62 62  Resp: 16 15  Temp:    SpO2: 100% 100%    Last Pain:  Vitals:   05/21/23 1415  TempSrc:   PainSc: Asleep                 Lannie Fields

## 2023-05-21 NOTE — Op Note (Signed)
Operative Note  Date of operation: 05/21/2023 Preoperative diagnosis: Right hip displaced subcapital femoral neck fracture Postoperative diagnosis: Same  Procedure: Right direct anterior total hip arthroplasty  Implants: Implant Name Type Inv. Item Serial No. Manufacturer Lot No. LRB No. Used Action  PINN SECTOR W/GRIP ACE CUP 56 - [id number] Hips PINN SECTOR W/GRIP ACE CUP 56  [employer] [id number] Right 1 Implanted  PINNACLE ALTRX PLUS 4 N 36X56 - [id number] Hips PINNACLE ALTRX PLUS 4 N 36X56  [employer] [id number] Right 1 Implanted  STEM FEMORAL SZ STD ACTIS - [id number] Stem STEM FEMORAL SZ STD ACTIS  [employer] [id number] Right 1 Implanted  SROM M HEAD PLUS 1.5 - [id number] Hips SROM M HEAD PLUS 1.5  [employer] [id number] Right 1 Implanted   Surgeon: Vanita Panda. Magnus Ivan, MD Assistant: April Green, RNFA  Anesthesia: Spinal Antibiotics: 2 g IV Ancef EBL: 200 cc Complications: None  Indications: The patient is an active 87 year old gentleman who unfortunately sustained a mechanical fall at home late yesterday.  He was brought to the [facility] emergency room and found to have an impacted and displaced subcapital femoral neck fracture of the right hip.  He was graciously admitted to the medicine service.  One of our orthopedic colleagues did see him last evening on call and since we have surgical time today and are comfortable with performing partial and total hip arthroplasties, we will put him on our schedule for today.  I did talk to the patient and his wife in detail.  We discussed the risk and benefits of the surgery and different treatment options.  The main issues Are acute loss anemia, nerve vessel injury, fracture, infection, DVT, implant failure, dislocation, and wound healing issues.  They understand that our goals are hopefully decreased pain, improved mobility, and improved quality of life.  Procedure description: After informed  consent was obtained and the appropriate right hip was marked, the patient was brought the operating room and spinal anesthesia was obtained while he is on the stretcher.  Next a Foley catheter was placed and then traction boots were placed on both his feet.  He was then placed supine on the Hana fracture table with a perineal post and placed in both legs and inline skeletal traction devices with no traction applied.  His right operative hip and pelvis were assessed radiographically.  The right hip was prepped and draped with DuraPrep and sterile drapes.  A timeout was called and he was notified of prescription patient correct right hip.  An incision was then made just inferior and posterior to the ASIS and carried slightly obliquely down the leg.  Dissection was carried down to the tensor fascia lata muscle and the tensor fascia was divided longitudinally to proceed with a direct anterior approach to the hip.  Circumflex vessels were identified and cauterized.  The hip capsule was identified and opened up in L-type format finding a very large arthrosis and a displaced and impacted femoral neck fracture.  Cobra retractors were placed around the lower medial and lateral femoral neck and a refreshing femoral neck cut was made just distal to the fracture were proximal to the lesser trochanter.  This was made with an oscillating saw and completed with an osteotome.  A corkscrew guide was placed in femoral head and the femoral head was removed its entirety.  A bent Hohmann was then placed over the medial acetabular rim and remnants of the acetabular labrum and other debris removed.  Reaming  was initiated under direct visualization as well as direct fluoroscopy from size 43 reamer and stepwise that he was going up to a size 55 reamer.  The last reamer was also placed under direct fluoroscopy in order to obtain the depth reaming, the inclination and the anteversion.  The real DePuy sector GRIPTION acetabular component size  56 was then placed without difficulty followed by 36+4 polythene liner.  Attention was then turned the femur.  With the right leg externally rotated to 120 degrees, extended and adducted, a Mueller retractors placed medially and a Hohmann retractor on greater trochanter.  The lateral joint capsule was released and a box cutting osteotome was used in her femoral canal.  Broaching was initiated using the Actis broaching system from a size 0 going to a size 6.  With size 6 in place we trialed a standard offset femoral neck and a 36+1.5 trial hip ball.  This was reduced in pelvis and we are pleased with assessment in terms of stability and range of motion as well as likely.  We then dislocated the hip remove the trial components.  We placed the real Actis femoral component with standard offset size 6 and the real 36+1.5 metal head ball.  Again this reduced and acetabular pleased with stability.  We assessed this again radiographically and mechanically.  The soft tissue was irrigated normal saline solution.  The joint capsule was closed with interrupted #1 Ethibond suture followed by #1 Vicryl to close the tensor fascia.  0 Vicryl was used for the deep tissue and 2-0 Vicryl to close the subcutaneous tissue.  The skin was reapproximated with staples.  Aquacel dressing was applied.  Patient was taken off the Hana table and taken to recovery.

## 2023-05-21 NOTE — Discharge Instructions (Signed)

## 2023-05-21 NOTE — Progress Notes (Signed)
Transition of Care [facility]) - CAGE-AID Screening   Patient Details  Name: Jeremy [name] MRN: [id number] Date of Birth: December 20, 1935  [facility] CM/SW Contact:    Leota Sauers, RN Phone Number: 05/21/2023, 8:05 PM   Clinical Narrative:  Patient endorses occasional use of alcohol, denies use of illicit substances. Resources not given at this time.   CAGE-AID Screening:    Have You Ever Felt You Ought to Cut Down on Your Drinking or Drug Use?: No Have People Annoyed You By Critizing Your Drinking Or Drug Use?: No Have You Felt Bad Or Guilty About Your Drinking Or Drug Use?: No Have You Ever Had a Drink or Used Drugs First Thing In The Morning to Steady Your Nerves or to Get Rid of a Hangover?: No CAGE-AID Score: 0  Substance Abuse Education Offered: No

## 2023-05-21 NOTE — Telephone Encounter (Signed)
This med was filled today by another provider- so I can refuse this, right?

## 2023-05-21 NOTE — Telephone Encounter (Signed)
The IP on the RF for today means he is an inpatient. He is in the hospital for a broken hip. Ok to send.

## 2023-05-21 NOTE — ED Notes (Signed)
Patient going to short stay, in stable condition, AOX4,  with his belongings, family, and staff.

## 2023-05-21 NOTE — Anesthesia Procedure Notes (Signed)
Spinal  Patient location during procedure: OR Start time: 05/21/2023 12:14 PM End time: 05/21/2023 12:17 PM Reason for block: surgical anesthesia Staffing Performed: anesthesiologist  Anesthesiologist: Lannie Fields, DO Performed by: Lannie Fields, DO Authorized by: Lannie Fields, DO   Preanesthetic Checklist Completed: patient identified, IV checked, risks and benefits discussed, surgical consent, monitors and equipment checked, pre-op evaluation and timeout performed Spinal Block Patient position: right lateral decubitus Prep: DuraPrep and site prepped and draped Patient monitoring: cardiac monitor, continuous pulse ox and blood pressure Approach: midline Location: L3-4 Injection technique: single-shot Needle Needle type: Pencan  Needle gauge: 24 G Needle length: 9 cm Assessment Sensory level: T6 Events: CSF return Additional Notes Functioning IV was confirmed and monitors were applied. Sterile prep and drape, including hand hygiene and sterile gloves were used. The patient was positioned and the spine was prepped. The skin was anesthetized with lidocaine.  Free flow of clear CSF was obtained prior to injecting local anesthetic into the CSF.  The spinal needle aspirated freely following injection.  The needle was carefully withdrawn.  The patient tolerated the procedure well.

## 2023-05-21 NOTE — Anesthesia Preprocedure Evaluation (Addendum)
Anesthesia Evaluation  Patient identified by MRN, date of birth, ID band Patient awake    Reviewed: Allergy & Precautions, NPO status , Patient's Chart, lab work & pertinent test results  Airway Mallampati: III  TM Distance: >3 FB Neck ROM: Full    Dental no notable dental hx. (+)    Pulmonary neg pulmonary ROS   Pulmonary exam normal breath sounds clear to auscultation       Cardiovascular negative cardio ROS Normal cardiovascular exam Rhythm:Regular Rate:Normal     Neuro/Psych  PSYCHIATRIC DISORDERS Anxiety Depression    negative neurological ROS     GI/Hepatic negative GI ROS, Neg liver ROS,,,  Endo/Other  negative endocrine ROS    Renal/GU negative Renal ROS  negative genitourinary   Musculoskeletal R hip fx    Abdominal   Peds  Hematology  (+) Blood dyscrasia, anemia Hb 11, plt 214   Anesthesia Other Findings   Reproductive/Obstetrics negative OB ROS                             Anesthesia Physical Anesthesia Plan  ASA: 3  Anesthesia Plan: Spinal and MAC   Post-op Pain Management: Tylenol PO (pre-op)*   Induction:   PONV Risk Score and Plan: 2 and Propofol infusion and TIVA  Airway Management Planned: Natural Airway and Nasal Cannula  Additional Equipment: None  Intra-op Plan:   Post-operative Plan:   Informed Consent: I have reviewed the patients History and Physical, chart, labs and discussed the procedure including the risks, benefits and alternatives for the proposed anesthesia with the patient or authorized representative who has indicated his/her understanding and acceptance.       Plan Discussed with: CRNA  Anesthesia Plan Comments:         Anesthesia Quick Evaluation

## 2023-05-21 NOTE — Progress Notes (Signed)
Progress Note   Patient: Jeremy [name] EAV:[id number] DOB: [birth year] DOA: 05/20/2023     1 DOS: the patient was seen and examined on 05/21/2023   Brief hospital course: Jeremy [name] is a 87 y.o. male with medical history significant of aortic atherosclerosis, BPH, bronchiectasis, depression, anxiety presented to the ED with right hip pain after mechanical fall.  Vital signs stable.  Labs notable for hemoglobin 11.9 (previously >14 on labs done about 2 years ago), MCV 92.7.  X-ray of right hip showing right hip displaced femoral neck fracture.  Plan for right hip total arthroplasty 12/17 p.m.   Assessment and Plan:  Right hip fracture -Due to mechanical fall -Plan for total right hip arthroplasty with Ortho -Pain control, bowel regimen with opioids.   Normocytic anemia Stable.  Bronchiectasis No signs or symptoms of exacerbation  First-degree AV block TSH within normal limits  Depression and anxiety Continue home medications   BPH Finasteride and flomax    Subjective: Patient in significant pain of right hip.  Otherwise no chest pain or shortness of breath.  Physical Exam: Vitals:   05/21/23 1430 05/21/23 1445 05/21/23 1500 05/21/23 1513  BP: (!) 106/56 (!) 111/57 (!) 146/86 102/60  Pulse: 62 61 61 61  Resp: 12 16 16 16   Temp:   97.6 F (36.4 C) 97.9 F (36.6 C)  TempSrc:    Oral  SpO2: 100% 100% 100% 100%  Weight:      Height:       Physical Exam  Constitutional: Thin and in mild distress  Cardiovascular: Normal rate, regular rhythm, intact distal pulses. No lower extremity edema  Pulmonary: Non labored breathing on room air, no wheezing or rales  Abdominal: Soft. Normal bowel sounds. Non distended and non tender Musculoskeletal: Right lower extremity TTP at hip. Palpable pulses and able to move toes. Movement limited due to pain  Neurological: Alert and oriented to person, place, and time. Non focal  Skin: Skin is warm and dry.   Data Reviewed: I have reviewed  all images and labs.     Latest Ref Rng & Units 05/21/2023    3:50 AM 05/20/2023    6:11 PM 06/23/2021   12:46 PM  BMP  Glucose 70 - 99 mg/dL 782  956  213   BUN 8 - 23 mg/dL 14  15  17    Creatinine 0.61 - 1.24 mg/dL 0.86  5.78  4.69   Sodium 135 - 145 mmol/L 136  138  129   Potassium 3.5 - 5.1 mmol/L 3.9  4.1  4.1   Chloride 98 - 111 mmol/L 106  102  88   CO2 22 - 32 mmol/L 23  26  33   Calcium 8.9 - 10.3 mg/dL 8.6  9.1  9.7       Latest Ref Rng & Units 05/21/2023    3:50 AM 05/20/2023    6:11 PM 06/23/2021   12:46 PM  CBC  WBC 4.0 - 10.5 K/uL 9.9  6.9  6.4   Hemoglobin 13.0 - 17.0 g/dL 62.9  52.8  41.3   Hematocrit 39.0 - 52.0 % 33.1  35.6  41.9   Platelets 150 - 400 K/uL 214  232  256.0      Family Communication: Spouse at bedside   Disposition: Status is: Inpatient Remains inpatient appropriate because: Total R hip arthroplasty scheduled. Pain control and safe disposition   Planned Discharge Destination:  Pending PT/OT eval   Time spent: 35 minutes  Author: Marolyn Haller,  MD 05/21/2023 7:35 PM  For on call review [email or web address].

## 2023-05-22 ENCOUNTER — Encounter (HOSPITAL_COMMUNITY): Payer: Self-pay | Admitting: Orthopaedic Surgery

## 2023-05-22 LAB — CBC
HCT: 28.3 % — ABNORMAL LOW (ref 39.0–52.0)
Hemoglobin: 9.6 g/dL — ABNORMAL LOW (ref 13.0–17.0)
MCH: 31.4 pg (ref 26.0–34.0)
MCHC: 33.9 g/dL (ref 30.0–36.0)
MCV: 92.5 fL (ref 80.0–100.0)
Platelets: 170 10*3/uL (ref 150–400)
RBC: 3.06 MIL/uL — ABNORMAL LOW (ref 4.22–5.81)
RDW: 12.3 % (ref 11.5–15.5)
WBC: 11.3 10*3/uL — ABNORMAL HIGH (ref 4.0–10.5)
nRBC: 0 % (ref 0.0–0.2)

## 2023-05-22 NOTE — Progress Notes (Signed)
? ?  Inpatient Rehab Admissions Coordinator : ? ?Per therapy recommendations, patient was screened for CIR candidacy by Blue Ruggerio RN MSN.  At this time patient appears to be a potential candidate for CIR. I will place a rehab consult per protocol for full assessment. Please call me with any questions. ? ?Jerriyah Louis RN MSN ?Admissions Coordinator ?[phone] ?  ?

## 2023-05-22 NOTE — Progress Notes (Signed)
Physical Therapy Treatment Patient Details Name: Jeremy [name] MRN: [id number] DOB: Apr 30, 1936 Today's Date: 05/22/2023   History of Present Illness 87 y.o. male admitted 05/20/23 after fall sustaining R femoral neck fx. S/p R THA (direct anterior) on 12/17. PMH includes BPH, anxiety, depression.    PT Comments  Pt received in supine, agreeable to therapy session and with good participation and effort for transfer and gait training despite having recently returned from chair to bed. Pt with improved standing/gait tolerance and able to perform some standing RLE exercises with c/o moderate pain. Pt needing up to minA with mod cues for safety/sequencing and increased time to initiate and perform tasks. Pt continues to benefit from PT services to progress toward functional mobility goals.    If plan is discharge home, recommend the following: A lot of help with walking and/or transfers;A lot of help with bathing/dressing/bathroom;Assistance with cooking/housework;Assist for transportation;Help with stairs or ramp for entrance   Can travel by private vehicle        Equipment Recommendations  Rolling walker (2 wheels);BSC/3in1 (BSC pending progress next couple of sessions)    Recommendations for Other Services       Precautions / Restrictions Precautions Precautions: Fall Restrictions Weight Bearing Restrictions Per Provider Order: Yes RLE Weight Bearing Per Provider Order: Weight bearing as tolerated     Mobility  Bed Mobility Overal bed mobility: Needs Assistance Bed Mobility: Supine to Sit, Sit to Supine     Supine to sit: HOB elevated, Contact guard Sit to supine: Min assist, Used rails   General bed mobility comments: min assist for R LE mgmt and increased time, pt able to use gait belt as leg lifter strap to sit up with slightly less assist needed than in previous session.    Transfers Overall transfer level: Needs assistance Equipment used: Rolling walker (2  wheels) Transfers: Sit to/from Stand, Bed to chair/wheelchair/BSC Sit to Stand: Min assist   Step pivot transfers: Min assist       General transfer comment: min assist to power up fully and steady, stepping towards L to recliner with min assist    Ambulation/Gait Ambulation/Gait assistance: Min assist Gait Distance (Feet): 60 Feet (47ft, seated break, 75ft, seated break, 36ft) Assistive device: Rolling walker (2 wheels) Gait Pattern/deviations: Step-through pattern, Decreased stride length, Trunk flexed, Decreased dorsiflexion - right, Decreased weight shift to right, Decreased stance time - right       General Gait Details: mildly antalgic gait pattern but good effort and improved standing tolerance this date. Initial drop in BP from sit>stand but improved after standing activity/gait trial. Pt with excellent participation, needs cues for safe RW management and activity pacing.   Stairs             Wheelchair Mobility     Tilt Bed    Modified Rankin (Stroke Patients Only)       Balance Overall balance assessment: Needs assistance Sitting-balance support: No upper extremity supported, Feet supported Sitting balance-Leahy Scale: Fair     Standing balance support: Bilateral upper extremity supported, During functional activity Standing balance-Leahy Scale: Poor Standing balance comment: relies on RW                            Cognition Arousal: Alert Behavior During Therapy: Flat affect Overall Cognitive Status: Within Functional Limits for tasks assessed  General Comments: Pt with sometimes slow processing, mild impulsivity and requires cueing for safety (attempting to get up before therapist is ready, or without RW) Anticipate this is baseline. Pt responds well to direct questions about his social history and hobbies, just appears reserved.        Exercises Other Exercises Other Exercises: standing  RLE AROM: hip flexion x10 reps; seated RLE AROM: LAQ x10 reps    General Comments General comments (skin integrity, edema, etc.): Spouse [name] present and supportive.      Pertinent Vitals/Pain Pain Assessment Pain Assessment: Faces Faces Pain Scale: Hurts even more Pain Location: R hip Pain Descriptors / Indicators: Discomfort, Grimacing, Guarding Pain Intervention(s): Monitored during session, Repositioned, Patient requesting pain meds-RN notified, Ice applied    Home Living                          Prior Function            PT Goals (current goals can now be found in the care plan section) Acute Rehab PT Goals Patient Stated Goal: regain mobility, return home PT Goal Formulation: With patient/family Time For Goal Achievement: 06/04/23 Progress towards PT goals: Progressing toward goals    Frequency    Min 1X/week      PT Plan      Co-evaluation              AM-PAC PT "6 Clicks" Mobility   Outcome Measure  Help needed turning from your back to your side while in a flat bed without using bedrails?: A Little Help needed moving from lying on your back to sitting on the side of a flat bed without using bedrails?: A Lot (flat bed/no rails) Help needed moving to and from a bed to a chair (including a wheelchair)?: A Little Help needed standing up from a chair using your arms (e.g., wheelchair or bedside chair)?: A Little Help needed to walk in hospital room?: A Lot Help needed climbing 3-5 steps with a railing? : Total 6 Click Score: 14    End of Session Equipment Utilized During Treatment: Gait belt Activity Tolerance: Patient tolerated treatment well Patient left: in bed;with call bell/phone within reach;with bed alarm set;Other (comment);with family/visitor present (ice over his R hip) Nurse Communication: Mobility status;Patient requests pain meds PT Visit Diagnosis: Other abnormalities of gait and mobility (R26.89);Muscle weakness  (generalized) (M62.81);Pain Pain - Right/Left: Right Pain - part of body: Hip     Time: 8657-8469 PT Time Calculation (min) (ACUTE ONLY): 33 min  Charges:    $Gait Training: 8-22 mins $Therapeutic Activity: 8-22 mins PT General Charges $$ ACUTE PT VISIT: 1 Visit                     Erabella Kuipers P., PTA Acute Rehabilitation Services Secure Chat Preferred 9a-5:30pm Office: [phone]    Dorathy Kinsman [facility] 05/22/2023, 3:57 PM

## 2023-05-22 NOTE — Evaluation (Signed)
Occupational Therapy Evaluation Patient Details Name: Jeremy [name] MRN: [id number] DOB: 1935/09/21 Today's Date: 05/22/2023   History of Present Illness 87 y.o. male admitted 05/20/23 after fall sustaining R femoral neck fx. S/p R THA (direct anterior) on 12/17. PMH includes BPH, anxiety, depression.   Clinical Impression   PTA patient reports needing some assist for ADLS from spouse, mobility with independence.  Admitted for above and presents with problem list below.  He requires min assist for bed mobility, min to setup assist for UB ADLS/mod-max assist for LB ADLS, and min assist for transfers using RW.  He follows most commands with increased time, but requires repeated cues intermittently. Verbal cues required for safety and problem solving, as he attempts to mobilize before having RW or before therapist is ready.  He has good support from spouse at home and anticipate he will progress well.  Based on performance today, believe pt will best benefit from continued OT services acutely and after dc at an inpatient setting with >3hrs/day to optimize independence, safety and return to PLOF with ADLs and mobility.      If plan is discharge home, recommend the following: A little help with walking and/or transfers;A lot of help with bathing/dressing/bathroom;Assistance with cooking/housework;Assist for transportation;Help with stairs or ramp for entrance    Functional Status Assessment  Patient has had a recent decline in their functional status and demonstrates the ability to make significant improvements in function in a reasonable and predictable amount of time.  Equipment Recommendations  Other (comment) (defer)    Recommendations for Other Services Rehab consult     Precautions / Restrictions Precautions Precautions: Fall Restrictions Weight Bearing Restrictions Per Provider Order: Yes RLE Weight Bearing Per Provider Order: Weight bearing as tolerated      Mobility Bed  Mobility Overal bed mobility: Needs Assistance Bed Mobility: Supine to Sit     Supine to sit: Min assist, HOB elevated, Used rails     General bed mobility comments: min assist for R LE mgmt and increased time    Transfers Overall transfer level: Needs assistance Equipment used: Rolling walker (2 wheels) Transfers: Sit to/from Stand, Bed to chair/wheelchair/BSC Sit to Stand: Min assist     Step pivot transfers: Mod assist     General transfer comment: min assist to power up fully and steady, stepping towards L to recliner with min assist      Balance Overall balance assessment: Needs assistance Sitting-balance support: No upper extremity supported, Feet supported Sitting balance-Leahy Scale: Fair     Standing balance support: Bilateral upper extremity supported, During functional activity Standing balance-Leahy Scale: Poor Standing balance comment: relies on RW                           ADL either performed or assessed with clinical judgement   ADL Overall ADL's : Needs assistance/impaired     Grooming: Sitting;Set up           Upper Body Dressing : Minimal assistance;Sitting   Lower Body Dressing: Total assistance;Sit to/from stand Lower Body Dressing Details (indicate cue type and reason): requires assist for socks, min assist to stand and relies on BUE support Toilet Transfer: Minimal assistance Toilet Transfer Details (indicate cue type and reason): step pivot to recliner using RW         Functional mobility during ADLs: Minimal assistance;Rolling walker (2 wheels);Cueing for sequencing;Cueing for safety       Vision   Vision Assessment?:  No apparent visual deficits     Perception         Praxis         Pertinent Vitals/Pain Pain Assessment Pain Assessment: Faces Faces Pain Scale: Hurts little more Pain Location: R hip Pain Descriptors / Indicators: Discomfort, Grimacing, Guarding Pain Intervention(s): Limited activity within  patient's tolerance, Monitored during session, Repositioned     Extremity/Trunk Assessment Upper Extremity Assessment Upper Extremity Assessment: RUE deficits/detail;LUE deficits/detail RUE Deficits / Details: grossly 3/5 MMT with arthritic changes in fingers, decreased grip strength.  tremulous with movement. RUE Coordination: decreased fine motor LUE Deficits / Details: grossly 3/5 MMT with arthritic changes in fingers, decreased grip strength. tremulous with movement. LUE Coordination: decreased fine motor   Lower Extremity Assessment Lower Extremity Assessment: Defer to PT evaluation       Communication Communication Communication: No apparent difficulties   Cognition Arousal: Alert Behavior During Therapy: Flat affect Overall Cognitive Status: Within Functional Limits for tasks assessed                                 General Comments: pt with slow processing, requires repetition at times to answer questions. mild impulsivity and requires cueing for safety (attempting to get up before therapist is ready, or without RW) Anticipate this is baseline.     General Comments  spouse present and supportive    Exercises     Shoulder Instructions      Home Living Family/patient expects to be discharged to:: Private residence Living Arrangements: Spouse/significant other Available Help at Discharge: Family;Available 24 hours/day Type of Home: House Home Access: Stairs to enter [employer] of Steps: 2-3 Entrance Stairs-Rails: Right Home Layout: Able to live on main level with bedroom/bathroom     Bathroom Shower/Tub: Chief Strategy Officer: Standard     Home Equipment: Rollator (4 wheels)          Prior Functioning/Environment Prior Level of Function : Needs assist             Mobility Comments: reports h/o significant depression results in him staying in bed most of day; able to walk household distances indep without DME.  reports 4 falls in the past ~4 months ADLs Comments: wife assists with dressing and bathing; pt reports indep with toileting. wife performs majority of iADLs; wife drives        OT Problem List: Decreased strength;Decreased activity tolerance;Impaired balance (sitting and/or standing);Pain;Impaired UE functional use;Decreased knowledge of precautions;Decreased knowledge of use of DME or AE;Decreased safety awareness;Decreased cognition      OT Treatment/Interventions: Self-care/ADL training;Therapeutic exercise;DME and/or AE instruction;Therapeutic activities;Balance training;Patient/family education    OT Goals(Current goals can be found in the care plan section) Acute Rehab OT Goals Patient Stated Goal: get better OT Goal Formulation: With patient Time For Goal Achievement: 06/05/23 Potential to Achieve Goals: Good  OT Frequency: Min 1X/week    Co-evaluation              AM-PAC OT "6 Clicks" Daily Activity     Outcome Measure Help from another person eating meals?: None Help from another person taking care of personal grooming?: A Little Help from another person toileting, which includes using toliet, bedpan, or urinal?: A Lot Help from another person bathing (including washing, rinsing, drying)?: A Lot Help from another person to put on and taking off regular upper body clothing?: A Little Help from another person to put on  and taking off regular lower body clothing?: A Lot 6 Click Score: 16   End of Session Equipment Utilized During Treatment: Gait belt;Rolling walker (2 wheels) Nurse Communication: Mobility status;Precautions  Activity Tolerance: Patient tolerated treatment well Patient left: in chair;with call bell/phone within reach;with family/visitor present  OT Visit Diagnosis: Other abnormalities of gait and mobility (R26.89);Muscle weakness (generalized) (M62.81);Pain;History of falling (Z91.81) Pain - Right/Left: Right Pain - part of body: Hip                 Time: 1130-1158 OT Time Calculation (min): 28 min Charges:  OT General Charges $OT Visit: 1 Visit OT Evaluation $OT Eval Moderate Complexity: 1 Mod OT Treatments $Self Care/Home Management : 8-22 mins  Barry Brunner, OT Acute Rehabilitation Services Office [phone]   Chancy Milroy 05/22/2023, 1:45 PM

## 2023-05-22 NOTE — PMR Pre-admission (Signed)
PMR Admission Coordinator Pre-Admission Assessment  Patient: Jeremy [name] is an 87 y.o., male MRN: [id number] DOB: 1935/10/20 Height: 5\' 6"  (167.6 cm) Weight: 49.9 kg  Insurance Information HMO:     PPO:      PCP:      IPA:      80/20:      OTHER:  PRIMARY: Medicare A/B      Policy#: [id number]      Subscriber: pt CM Name:       Phone#:      Fax#:  Pre-Cert#: verified Health and safety inspector:  Benefits:  Phone #:      Name:  Eff. Date:  12/03/75 A, 12/03/02 B     Deduct: $9563 (2025: $1676)      Out of Pocket Max: n/a      Life Max: n/a CIR: 100%      SNF: 20 full days Outpatient: 80%     Co-Pay: 20% Home Health: 100%      Co-Pay:  DME: 80%     Co-Pay: 20% Providers:  SECONDARY: BCBS      Policy#: [id number]     Phone#: [phone]  TERTIARY:  Tricare for Life  Policy: [id number]   Phone [phone]  Financial Counselor:       Phone#:   The "Data Collection Information Summary" for patients in Inpatient Rehabilitation Facilities with attached "Privacy Act Statement-Health Care Records" was provided and verbally reviewed with: Patient and Family  Emergency Contact Information Contact Information     Name Relation Home Work Mobile   Lemmons,[name] Spouse [phone]  [phone]      Other Contacts   None on File     Current Medical History  Patient Admitting Diagnosis: R hip fracture s/p THA  History of Present Illness: Pt is an 87 y/o male with PMH of BPH, anxiety, and severe depression who admitted to [facility] on 05/20/23 following a fall at home.  In ED vitals stable, labs only notable for Hgb 11.9.  Plain films revealed femoral neck fx on the R.  Orthopedics was consulted and recommended surgical fixation.  Pt underwent a R total hip arthroplasty with anterior approach on 12/17 per Dr. Magnus Ivan.  Post op he is WBAT on RLE with no hip precautions.  Post op hospital course pain management.  Therapy evaluations completed and pt was recommended for CIR.     Patient's  medical record from [facility] has been reviewed by the rehabilitation admission coordinator and physician.  Past Medical History  Past Medical History:  Diagnosis Date   Aortic atherosclerosis (HCC)    BPH (benign prostatic hyperplasia)    Bronchiectasis (HCC)    Depression    Depression    GAD (generalized anxiety disorder)     Has the patient had major surgery during 100 days prior to admission? Yes  Family History   family history includes Diabetes in his mother.  Current Medications  Current Facility-Administered Medications:    0.9 %  sodium chloride infusion, , Intravenous, Continuous, Kathryne Hitch, MD, Last Rate: 75 mL/hr at 05/21/23 1557, New Bag at 05/21/23 1557   acetaminophen (TYLENOL) tablet 325-650 mg, 325-650 mg, Oral, Q6H PRN, Kathryne Hitch, MD   acetaminophen (TYLENOL) tablet 650 mg, 650 mg, Oral, Q6H, Kathryne Hitch, MD, 650 mg at 05/23/23 0802   ARIPiprazole (ABILIFY) tablet 2 mg, 2 mg, Oral, QHS, Marolyn Haller, MD   aspirin chewable tablet 81 mg, 81 mg, Oral, BID, Kathryne Hitch,  MD, 81 mg at 05/23/23 1610   Chlorhexidine Gluconate Cloth 2 % PADS 6 each, 6 each, Topical, Daily, Kathryne Hitch, MD, 6 each at 05/22/23 1734   docusate sodium (COLACE) capsule 100 mg, 100 mg, Oral, BID, Kathryne Hitch, MD, 100 mg at 05/23/23 0802   DULoxetine (CYMBALTA) DR capsule 120 mg, 120 mg, Oral, Daily, Kathryne Hitch, MD, 120 mg at 05/23/23 0803   finasteride (PROSCAR) tablet 5 mg, 5 mg, Oral, q morning, Kathryne Hitch, MD, 5 mg at 05/23/23 0802   HYDROmorphone (DILAUDID) injection 0.5-1 mg, 0.5-1 mg, Intravenous, Q4H PRN, Kathryne Hitch, MD, 0.5 mg at 05/21/23 2158   menthol-cetylpyridinium (CEPACOL) lozenge 3 mg, 1 lozenge, Oral, PRN **OR** phenol (CHLORASEPTIC) mouth spray 1 spray, 1 spray, Mouth/Throat, PRN, Kathryne Hitch, MD   methocarbamol (ROBAXIN) tablet 500 mg, 500 mg, Oral,  Q6H PRN, 500 mg at 05/23/23 0803 **OR** methocarbamol (ROBAXIN) injection 500 mg, 500 mg, Intravenous, Q6H PRN, Kathryne Hitch, MD   mirtazapine (REMERON) tablet 30 mg, 30 mg, Oral, QHS, Kathryne Hitch, MD, 30 mg at 05/21/23 2052   naloxone [facility]) injection 0.4 mg, 0.4 mg, Intravenous, PRN, Kathryne Hitch, MD   ondansetron [facility]) tablet 4 mg, 4 mg, Oral, Q6H PRN **OR** ondansetron (ZOFRAN) injection 4 mg, 4 mg, Intravenous, Q6H PRN, Kathryne Hitch, MD   oxyCODONE (Oxy IR/ROXICODONE) immediate release tablet 10-15 mg, 10-15 mg, Oral, Q4H PRN, Kathryne Hitch, MD, 15 mg at 05/22/23 0121   oxyCODONE (Oxy IR/ROXICODONE) immediate release tablet 5-10 mg, 5-10 mg, Oral, Q4H PRN, Kathryne Hitch, MD, 10 mg at 05/23/23 0803   polyethylene glycol (MIRALAX / GLYCOLAX) packet 17 g, 17 g, Oral, Daily PRN, Kathryne Hitch, MD   senna [facility]) tablet 8.6 mg, 1 tablet, Oral, Daily, Kathryne Hitch, MD, 8.6 mg at 05/23/23 0803   tamsulosin (FLOMAX) capsule 0.4 mg, 0.4 mg, Oral, Q48H, Kathryne Hitch, MD, 0.4 mg at 05/21/23 2051  Patients Current Diet:  Diet Order             Diet - low sodium heart healthy           Diet regular Room service appropriate? Yes; Fluid consistency: Thin  Diet effective now                   Precautions / Restrictions Precautions Precautions: Fall Restrictions Weight Bearing Restrictions Per Provider Order: Yes RLE Weight Bearing Per Provider Order: Weight bearing as tolerated   Has the patient had 2 or more falls or a fall with injury in the past year? Yes  Prior Activity Level Household: self limited mobility 2/2 severe depression since Oct of last year.  Indep without DME in household setting, assist PRN 2/2 mental health, doesn't drive  Prior Functional Level Self Care: Did the patient need help bathing, dressing, using the toilet or eating? Needed some help  Indoor Mobility: Did  the patient need assistance with walking from room to room (with or without device)? Needed some help  Stairs: Did the patient need assistance with internal or external stairs (with or without device)? Needed some help  Functional Cognition: Did the patient need help planning regular tasks such as shopping or remembering to take medications? Needed some help  Patient Information Are you of Hispanic, Latino/a,or Spanish origin?: A. No, not of Hispanic, Latino/a, or Spanish origin What is your race?: A. White Do you need or want an interpreter to communicate with  a doctor or health care staff?: 0. No  Patient's Response To:  Health Literacy and Transportation Is the patient able to respond to health literacy and transportation needs?: Yes Health Literacy - How often do you need to have someone help you when you read instructions, pamphlets, or other written material from your doctor or pharmacy?: Never In the past 12 months, has lack of transportation kept you from medical appointments or from getting medications?: No In the past 12 months, has lack of transportation kept you from meetings, work, or from getting things needed for daily living?: No  Home Assistive Devices / Equipment Home Equipment: Rollator (4 wheels)  Prior Device Use: Indicate devices/aids used by the patient prior to current illness, exacerbation or injury? None of the above  Current Functional Level Cognition  Overall Cognitive Status: Within Functional Limits for tasks assessed Orientation Level: Oriented X4 General Comments: Pt with sometimes slow processing, mild impulsivity and requires cueing for safety (attempting to get up before therapist is ready, or without RW) Anticipate this is baseline. Pt responds well to direct questions about his social history and hobbies, just appears reserved.    Extremity Assessment (includes Sensation/Coordination)  Upper Extremity Assessment: RUE deficits/detail, LUE  deficits/detail RUE Deficits / Details: grossly 3/5 MMT with arthritic changes in fingers, decreased grip strength.  tremulous with movement. RUE Coordination: decreased fine motor LUE Deficits / Details: grossly 3/5 MMT with arthritic changes in fingers, decreased grip strength. tremulous with movement. LUE Coordination: decreased fine motor  Lower Extremity Assessment: Defer to PT evaluation RLE Deficits / Details: femoral fx s/p R THA; able to perform full range LAQ, gross hip strength < 3/5 with expected post-op pain and weakness, though able to WB    ADLs  Overall ADL's : Needs assistance/impaired Grooming: Sitting, Set up Upper Body Dressing : Minimal assistance, Sitting Lower Body Dressing: Total assistance, Sit to/from stand Lower Body Dressing Details (indicate cue type and reason): requires assist for socks, min assist to stand and relies on BUE support Toilet Transfer: Minimal assistance Toilet Transfer Details (indicate cue type and reason): step pivot to recliner using RW Functional mobility during ADLs: Minimal assistance, Rolling walker (2 wheels), Cueing for sequencing, Cueing for safety    Mobility  Overal bed mobility: Needs Assistance Bed Mobility: Supine to Sit, Sit to Supine Supine to sit: HOB elevated, Contact guard Sit to supine: Min assist, Used rails General bed mobility comments: min assist for R LE mgmt and increased time, pt able to use gait belt as leg lifter strap to sit up with slightly less assist needed than in previous session.    Transfers  Overall transfer level: Needs assistance Equipment used: Rolling walker (2 wheels) Transfers: Sit to/from Stand, Bed to chair/wheelchair/BSC Sit to Stand: Min assist Bed to/from chair/wheelchair/BSC transfer type:: Step pivot Step pivot transfers: Min assist General transfer comment: min assist to power up fully and steady, stepping towards L to recliner with min assist    Ambulation / Gait / Stairs / Wheelchair  Mobility  Ambulation/Gait Ambulation/Gait assistance: Editor, commissioning (Feet): 60 Feet (53ft, seated break, 60ft, seated break, 87ft) Assistive device: Rolling walker (2 wheels) Gait Pattern/deviations: Step-through pattern, Decreased stride length, Trunk flexed, Decreased dorsiflexion - right, Decreased weight shift to right, Decreased stance time - right General Gait Details: mildly antalgic gait pattern but good effort and improved standing tolerance this date. Initial drop in BP from sit>stand but improved after standing activity/gait trial. Pt with excellent participation, needs cues  for safe RW management and activity pacing.    Posture / Balance Balance Overall balance assessment: Needs assistance Sitting-balance support: No upper extremity supported, Feet supported Sitting balance-Leahy Scale: Fair Standing balance support: Bilateral upper extremity supported, During functional activity Standing balance-Leahy Scale: Poor Standing balance comment: relies on RW    Special needs/care consideration Skin surgical incision to R hip   Previous Home Environment (from acute therapy documentation) Living Arrangements: Spouse/significant other Available Help at Discharge: Family, Available 24 hours/day Type of Home: House Home Layout: Able to live on main level with bedroom/bathroom Home Access: Stairs to enter Entrance Stairs-Rails: Right Entrance Stairs-Number of Steps: 2-3 Bathroom Shower/Tub: Engineer, manufacturing systems: Standard Home Care Services: No  Discharge Living Setting Plans for Discharge Living Setting: Patient's home, Lives with (comment) (spouse) Type of Home at Discharge: House Discharge Home Layout: Able to live on main level with bedroom/bathroom Discharge Home Access: Stairs to enter Entrance Stairs-Rails: Right Entrance Stairs-Number of Steps: 2-3 Discharge Bathroom Shower/Tub: Tub/shower unit Discharge Bathroom Toilet: Standard Discharge Bathroom  Accessibility: Yes How Accessible: Accessible via walker Does the patient have any problems obtaining your medications?: No  Social/Family/Support Systems Patient Roles: Spouse Anticipated Caregiver: spouse, [name] Anticipated Industrial/product designer Information: [phone] Ability/Limitations of Caregiver: supervision to min assist Caregiver Availability: 24/7 Discharge Plan Discussed with Primary Caregiver: Yes Is Caregiver In Agreement with Plan?: Yes Does Caregiver/Family have Issues with Lodging/Transportation while Pt is in Rehab?: No  Goals Patient/Family Goal for Rehab: PT/OT supervision to mod I, SLP n/a Expected length of stay: 14-18 days Additional Information: Discharge plan: discharge back to pt's house, wife was providing assist at baseline 2/2 pt's depression Pt/Family Agrees to Admission and willing to participate: Yes Program Orientation Provided & Reviewed with Pt/Caregiver Including Roles  & Responsibilities: Yes Additional Information Needs: Pt would benefit from neuropsych while on CIR  Decrease burden of Care through IP rehab admission: n/a  Possible need for SNF placement upon discharge:  Not anticipated.  Plan for discharge to pt's home with spouse providing up to min assist, 24/7  Patient Condition: I have reviewed medical records from [facility], spoken with CM, and patient and spouse. I met with patient at the bedside for inpatient rehabilitation assessment.  Patient will benefit from ongoing PT and OT, can actively participate in 3 hours of therapy a day 5 days of the week, and can make measurable gains during the admission.  Patient will also benefit from the coordinated team approach during an Inpatient Acute Rehabilitation admission.  The patient will receive intensive therapy as well as Rehabilitation physician, nursing, social worker, and care management interventions.  Due to safety, skin/wound care, disease management, medication administration, pain  management, and patient education the patient requires 24 hour a day rehabilitation nursing.  The patient is currently min assist with mobility and basic ADLs.  Discharge setting and therapy post discharge at  home  is anticipated.  Patient has agreed to participate in the Acute Inpatient Rehabilitation Program and will admit today.  Preadmission Screen Completed By:  Stephania Fragmin, PT, DPT  05/23/2023 10:49 AM ______________________________________________________________________   Discussed status with Dr. Berline Chough on 05/23/23  at 10:49 AM  and received approval for admission today.  Admission Coordinator:  Stephania Fragmin, PT, DPT time 10:49 AM Dorna Bloom 05/23/23    Assessment/Plan: Diagnosis: R hip fracture Does the need for close, 24 hr/day Medical supervision in concert with the patient's rehab needs make it unreasonable for this patient to be served in  a less intensive setting? Yes Co-Morbidities requiring supervision/potential complications: ABLA; severe depression- very little responiveness- hyponatremia; Iron def anemia; leukocytosis- rising Due to bladder management, bowel management, safety, skin/wound care, disease management, medication administration, pain management, and patient education, does the patient require 24 hr/day rehab nursing? Yes Does the patient require coordinated care of a physician, rehab nurse, PT, OT, and SLP to address physical and functional deficits in the context of the above medical diagnosis(es)? Yes Addressing deficits in the following areas: balance, endurance, locomotion, strength, transferring, bowel/bladder control, bathing, dressing, feeding, grooming, and toileting Can the patient actively participate in an intensive therapy program of at least 3 hrs of therapy 5 days a week? Yes The potential for patient to make measurable gains while on inpatient rehab is good Anticipated functional outcomes upon discharge from inpatient rehab: modified independent and  supervision PT, modified independent and supervision OT, n/a SLP Estimated rehab length of stay to reach the above functional goals is: 14-18 days Anticipated discharge destination: Home 10. Overall Rehab/Functional Prognosis: good   MD Signature:

## 2023-05-22 NOTE — Progress Notes (Signed)
Progress Note   Patient: Jeremy [name] KGM:[id number] DOB: Nov 01, 1935 DOA: 05/20/2023     2 DOS: the patient was seen and examined on 05/22/2023   Brief hospital course: Barkon Clink is a 87 y.o. male with medical history significant of aortic atherosclerosis, BPH, bronchiectasis, depression, anxiety presented to the ED with right hip pain after mechanical fall.  Vital signs stable.  Labs notable for hemoglobin 11.9 (previously >14 on labs done about 2 years ago), MCV 92.7.  X-ray of right hip showing right hip displaced femoral neck fracture.  Plan for right hip total arthroplasty 12/17 p.m.   Assessment and Plan:  Right hip fracture -Due to mechanical fall -s/p total right hip arthroplasty with Ortho 12/17 -Pain control, bowel regimen with opioids.   Normocytic anemia Decreased this AM s/p OR. No obvious signs of bleeding.  Continue to monitor   Bronchiectasis No signs or symptoms of exacerbation  First-degree AV block TSH within normal limits  Depression and anxiety Continue home medications  BPH Finasteride and flomax    Subjective: Patient continues to have R hip pain. Denies chest pain or shortness of breath.   Physical Exam: Vitals:   05/21/23 2154 05/21/23 2316 05/22/23 0339 05/22/23 0815  BP: 127/60 111/62 116/69 (!) 114/58  Pulse: 77 78 81 85  Resp: 18 16 16 16   Temp: 97.6 F (36.4 C) 98 F (36.7 C) 99.2 F (37.3 C) 99.1 F (37.3 C)  TempSrc: Oral Oral Oral Oral  SpO2: 98% 94% 96% 94%  Weight:      Height:        Physical Exam  Constitutional: Thin and in mild distress.  Cardiovascular: Normal rate, regular rhythm, intact distal pulses. No lower extremity edema  Pulmonary: Non labored breathing on room air, no wheezing or rales  Abdominal: Soft. Normal bowel sounds. Non distended and non tender Musculoskeletal: RLE ROM limited by pain.  Neurological: Alert and oriented to person, place, and time. Non focal. Able to move R foot  Skin: Skin is warm and  dry.   Data Reviewed: I have reviewed all images and labs.     Latest Ref Rng & Units 05/21/2023    3:50 AM 05/20/2023    6:11 PM 06/23/2021   12:46 PM  BMP  Glucose 70 - 99 mg/dL 644  034  742   BUN 8 - 23 mg/dL 14  15  17    Creatinine 0.61 - 1.24 mg/dL 5.95  6.38  7.56   Sodium 135 - 145 mmol/L 136  138  129   Potassium 3.5 - 5.1 mmol/L 3.9  4.1  4.1   Chloride 98 - 111 mmol/L 106  102  88   CO2 22 - 32 mmol/L 23  26  33   Calcium 8.9 - 10.3 mg/dL 8.6  9.1  9.7       Latest Ref Rng & Units 05/22/2023    6:43 AM 05/21/2023    3:50 AM 05/20/2023    6:11 PM  CBC  WBC 4.0 - 10.5 K/uL 11.3  9.9  6.9   Hemoglobin 13.0 - 17.0 g/dL 9.6  43.3  29.5   Hematocrit 39.0 - 52.0 % 28.3  33.1  35.6   Platelets 150 - 400 K/uL 170  214  232      Family Communication: Spouse at bedside   Disposition: Status is: Inpatient Remains inpatient appropriate because: Total R hip arthroplasty scheduled. Pain control and safe disposition   Planned Discharge Destination:  Pending PT/OT eval  Time spent: 25 minutes  Author: Marolyn Haller, MD 05/22/2023 6:48 PM  For on call review [email or web address].

## 2023-05-22 NOTE — Progress Notes (Signed)
Inpatient Rehab Coordinator Note:  I met with patient and his spouse, [name], at bedside to discuss CIR recommendations and goals/expectations of CIR stay.  We reviewed 3 hrs/day of therapy, physician follow up, and average length of stay 2 weeks (dependent upon progress) with goals of supervision for household level mobility.  [name] is home and can provide expected level of assist.  Of note, pt with flat affect, disengaged from conversation, but does answer yes/no questions and agreeable to CIR stay.  Note hx of significant depression prior to admit and may benefit from neuropsych services on CIR.  I verified his Medicare which will not require prior auth.  Will plan for potential admit in the next 1-2 days when medically cleared and bed available.   Estill Dooms, PT, DPT Admissions Coordinator [phone] 05/22/23  11:24 AM

## 2023-05-22 NOTE — Progress Notes (Signed)
Initial Nutrition Assessment  DOCUMENTATION CODES:   Non-severe (moderate) malnutrition in context of chronic illness  INTERVENTION:   -Continue regular diet, please encourage intakes.   -Provide Magic cup TID with meals, each supplement provides 290 kcal and 9 grams of protein for repletion and to help meet estimated needs.  -Consider appetite stimulant.   NUTRITION DIAGNOSIS:   Moderate Malnutrition related to poor appetite as evidenced by mild muscle depletion, moderate muscle depletion, mild fat depletion, per patient/family report.    GOAL:   Patient will meet greater than or equal to 90% of their needs    MONITOR:   PO intake, Supplement acceptance, Skin, Labs, Weight trends  REASON FOR ASSESSMENT:   Consult Assessment of nutrition requirement/status  ASSESSMENT:  87 y/o male presented to the ED with right hip pain after a mechanical fall. Admitted with right hip fracture. PMH: aortic atherosclerosis, bronchiectasis, depression, anxiety, hernia repair.  Intakes recorded 75% x 1 meal. Patient answers some questions during visit.  States he has poor appetite for a prolonged period. He does not provide details unclear if unable or that he does not know. Denies chewing and or swallowing difficulty of foods and or liquids. Food allergies reviewed, patient does not recall which foods he is allergic or avoidances. Per discussion with his wife, no food avoidances. She confirms reduced dietary intakes past two years and attributes poor appetite to clinical depression which he is being medically treated although does not see an improvement in appetite. Declines Ensure and Boost, willing to trial Magic Cups. He allows the NFPE. Limited EMR weight hx, weight appears stable past 24 months.   Medications reviewed and include colace, remeron, senna, 0.9% sodium chloride infusion @ 75 mL/hr.  Labs reviewed  NUTRITION - FOCUSED PHYSICAL EXAM:  Flowsheet Row Most Recent Value   Orbital Region No depletion  Upper Arm Region No depletion  Thoracic and Lumbar Region Unable to assess  Buccal Region Mild depletion  Temple Region Moderate depletion  Clavicle Bone Region Moderate depletion  Clavicle and Acromion Bone Region Moderate depletion  Scapular Bone Region Unable to assess  Dorsal Hand Mild depletion  Patellar Region Moderate depletion  Anterior Thigh Region Mild depletion  Posterior Calf Region Mild depletion  Edema (RD Assessment) None  Hair Reviewed  Eyes Reviewed  Mouth Reviewed  Skin Reviewed  Nails Reviewed       Diet Order:   Diet Order             Diet regular Room service appropriate? Yes; Fluid consistency: Thin  Diet effective now                   EDUCATION NEEDS:   Not appropriate for education at this time  Skin:  Skin Assessment: Skin Integrity Issues: Skin Integrity Issues:: Incisions Incisions: R hip  Last BM:  05/19/23  Height:   Ht Readings from Last 1 Encounters:  05/20/23 5\' 6"  (1.676 m)    Weight:   Wt Readings from Last 1 Encounters:  05/20/23 49.9 kg    Ideal Body Weight:  64.5 kg  BMI:  Body mass index is 17.75 kg/m.  Estimated Nutritional Needs:   Kcal:  1650-1900 kcal/day  Protein:  65-75 gm/day  Fluid:  1650-1900 mL/day    Alvino Chapel, RDLD Clinical Dietitian If unable to reach, please contact "RD Inpatient" secure chat group between 8 am-4 pm daily"

## 2023-05-22 NOTE — Plan of Care (Signed)

## 2023-05-22 NOTE — Progress Notes (Signed)
Patient ID: Jeremy [name], male   DOB: 09-Feb-1936, 87 y.o.   MRN: [id number] The patient is sitting up in a bedside chair.  He appears comfortable.  His wife is at the bedside as well.  I was able to share with them his postoperative x-ray showing him the right hip replacement.  His dressing looks good on his right hip.  From my standpoint he can continue to be up with assistance and working on mobility with weightbearing as tolerated with his right anterior hip.  I will continue to follow him while he is here.  His vital signs are stable.

## 2023-05-23 ENCOUNTER — Inpatient Hospital Stay (HOSPITAL_COMMUNITY)
Admission: AD | Admit: 2023-05-23 | Discharge: 2023-06-03 | DRG: 560 | Disposition: A | Discharge status: Home / Self Care | Payer: Medicare Other | Source: Intra-hospital | Attending: Physical Medicine and Rehabilitation | Admitting: Physical Medicine and Rehabilitation

## 2023-05-23 DIAGNOSIS — I700 Atherosclerosis of aorta: Secondary | ICD-10-CM | POA: Diagnosis present

## 2023-05-23 DIAGNOSIS — G8929 Other chronic pain: Secondary | ICD-10-CM | POA: Diagnosis present

## 2023-05-23 DIAGNOSIS — F32A Depression, unspecified: Secondary | ICD-10-CM

## 2023-05-23 DIAGNOSIS — R54 Age-related physical debility: Secondary | ICD-10-CM | POA: Diagnosis present

## 2023-05-23 DIAGNOSIS — S72001A Fracture of unspecified part of neck of right femur, initial encounter for closed fracture: Principal | ICD-10-CM | POA: Diagnosis present

## 2023-05-23 DIAGNOSIS — R32 Unspecified urinary incontinence: Secondary | ICD-10-CM | POA: Diagnosis present

## 2023-05-23 DIAGNOSIS — S72011D Unspecified intracapsular fracture of right femur, subsequent encounter for closed fracture with routine healing: Principal | ICD-10-CM

## 2023-05-23 DIAGNOSIS — Z833 Family history of diabetes mellitus: Secondary | ICD-10-CM

## 2023-05-23 DIAGNOSIS — E611 Iron deficiency: Secondary | ICD-10-CM | POA: Diagnosis present

## 2023-05-23 DIAGNOSIS — E876 Hypokalemia: Secondary | ICD-10-CM | POA: Diagnosis present

## 2023-05-23 DIAGNOSIS — F411 Generalized anxiety disorder: Secondary | ICD-10-CM | POA: Diagnosis present

## 2023-05-23 DIAGNOSIS — W19XXXA Unspecified fall, initial encounter: Secondary | ICD-10-CM | POA: Diagnosis not present

## 2023-05-23 DIAGNOSIS — R29898 Other symptoms and signs involving the musculoskeletal system: Secondary | ICD-10-CM | POA: Diagnosis present

## 2023-05-23 DIAGNOSIS — I440 Atrioventricular block, first degree: Secondary | ICD-10-CM | POA: Diagnosis present

## 2023-05-23 DIAGNOSIS — Z79899 Other long term (current) drug therapy: Secondary | ICD-10-CM

## 2023-05-23 DIAGNOSIS — Z96641 Presence of right artificial hip joint: Secondary | ICD-10-CM | POA: Diagnosis present

## 2023-05-23 DIAGNOSIS — Z882 Allergy status to sulfonamides status: Secondary | ICD-10-CM | POA: Diagnosis not present

## 2023-05-23 DIAGNOSIS — S72001S Fracture of unspecified part of neck of right femur, sequela: Secondary | ICD-10-CM

## 2023-05-23 DIAGNOSIS — E871 Hypo-osmolality and hyponatremia: Secondary | ICD-10-CM | POA: Diagnosis present

## 2023-05-23 DIAGNOSIS — F419 Anxiety disorder, unspecified: Secondary | ICD-10-CM | POA: Diagnosis not present

## 2023-05-23 DIAGNOSIS — N400 Enlarged prostate without lower urinary tract symptoms: Secondary | ICD-10-CM | POA: Diagnosis present

## 2023-05-23 DIAGNOSIS — R609 Edema, unspecified: Secondary | ICD-10-CM | POA: Diagnosis not present

## 2023-05-23 DIAGNOSIS — K5901 Slow transit constipation: Secondary | ICD-10-CM | POA: Diagnosis not present

## 2023-05-23 DIAGNOSIS — Z471 Aftercare following joint replacement surgery: Secondary | ICD-10-CM

## 2023-05-23 DIAGNOSIS — I10 Essential (primary) hypertension: Secondary | ICD-10-CM | POA: Diagnosis not present

## 2023-05-23 DIAGNOSIS — W19XXXD Unspecified fall, subsequent encounter: Secondary | ICD-10-CM | POA: Diagnosis present

## 2023-05-23 DIAGNOSIS — Z888 Allergy status to other drugs, medicaments and biological substances status: Secondary | ICD-10-CM | POA: Diagnosis not present

## 2023-05-23 DIAGNOSIS — D62 Acute posthemorrhagic anemia: Secondary | ICD-10-CM | POA: Diagnosis not present

## 2023-05-23 DIAGNOSIS — S7291XS Unspecified fracture of right femur, sequela: Secondary | ICD-10-CM | POA: Diagnosis not present

## 2023-05-23 DIAGNOSIS — K5900 Constipation, unspecified: Secondary | ICD-10-CM | POA: Diagnosis present

## 2023-05-23 DIAGNOSIS — S72141A Displaced intertrochanteric fracture of right femur, initial encounter for closed fracture: Secondary | ICD-10-CM

## 2023-05-23 DIAGNOSIS — G8918 Other acute postprocedural pain: Secondary | ICD-10-CM | POA: Diagnosis not present

## 2023-05-23 DIAGNOSIS — E440 Moderate protein-calorie malnutrition: Secondary | ICD-10-CM | POA: Insufficient documentation

## 2023-05-23 LAB — IRON AND TIBC
Iron: 10 ug/dL — ABNORMAL LOW (ref 45–182)
Saturation Ratios: 5 % — ABNORMAL LOW (ref 17.9–39.5)
TIBC: 203 ug/dL — ABNORMAL LOW (ref 250–450)
UIBC: 193 ug/dL

## 2023-05-23 LAB — CBC
HCT: 26.8 % — ABNORMAL LOW (ref 39.0–52.0)
Hemoglobin: 9.1 g/dL — ABNORMAL LOW (ref 13.0–17.0)
MCH: 31.1 pg (ref 26.0–34.0)
MCHC: 34 g/dL (ref 30.0–36.0)
MCV: 91.5 fL (ref 80.0–100.0)
Platelets: 155 10*3/uL (ref 150–400)
RBC: 2.93 MIL/uL — ABNORMAL LOW (ref 4.22–5.81)
RDW: 12 % (ref 11.5–15.5)
WBC: 12.1 10*3/uL — ABNORMAL HIGH (ref 4.0–10.5)
nRBC: 0 % (ref 0.0–0.2)

## 2023-05-23 LAB — BASIC METABOLIC PANEL
Anion gap: 8 (ref 5–15)
BUN: 13 mg/dL (ref 8–23)
CO2: 24 mmol/L (ref 22–32)
Calcium: 8.3 mg/dL — ABNORMAL LOW (ref 8.9–10.3)
Chloride: 100 mmol/L (ref 98–111)
Creatinine, Ser: 0.94 mg/dL (ref 0.61–1.24)
GFR, Estimated: >60 mL/min (ref >60)
Glucose, Bld: 117 mg/dL — ABNORMAL HIGH (ref 70–99)
Potassium: 3.7 mmol/L (ref 3.5–5.1)
Sodium: 132 mmol/L — ABNORMAL LOW (ref 135–145)

## 2023-05-23 LAB — FERRITIN: Ferritin: 190 ng/mL (ref 24–336)

## 2023-05-23 MED ORDER — METHOCARBAMOL 500 MG PO TABS: 500.0000 mg | ORAL_TABLET | Freq: Four times a day (QID) | ORAL | 0 refills | Status: AC | PRN

## 2023-05-23 MED ORDER — ASPIRIN 81 MG PO CHEW: 81.0000 mg | CHEWABLE_TABLET | Freq: Two times a day (BID) | ORAL | Status: DC

## 2023-05-23 MED ORDER — ACETAMINOPHEN 325 MG PO TABS
650.0000 mg | ORAL_TABLET | Freq: Four times a day (QID) | ORAL | Status: DC
Start: 2023-05-23 — End: 2023-06-03
  Administered 2023-05-23 – 2023-06-03 (×33): 650 mg via ORAL
  Filled 2023-05-23 (×35): qty 2

## 2023-05-23 MED ORDER — METHOCARBAMOL 500 MG PO TABS
500.0000 mg | ORAL_TABLET | Freq: Four times a day (QID) | ORAL | Status: DC | PRN
  Administered 2023-05-24 – 2023-05-25 (×2): 500 mg via ORAL
  Filled 2023-05-23 (×2): qty 1

## 2023-05-23 MED ORDER — OXYCODONE HCL 5 MG PO TABS
10.0000 mg | ORAL_TABLET | ORAL | Status: DC | PRN
  Administered 2023-05-23: 10 mg via ORAL
  Administered 2023-05-24: 15 mg via ORAL
  Administered 2023-05-27 – 2023-05-28 (×4): 10 mg via ORAL
  Filled 2023-05-23 (×4): qty 2
  Filled 2023-05-23: qty 3
  Filled 2023-05-23: qty 2

## 2023-05-23 MED ORDER — ENOXAPARIN SODIUM 40 MG/0.4ML IJ SOSY
40.0000 mg | PREFILLED_SYRINGE | INTRAMUSCULAR | Status: DC
  Administered 2023-05-23 – 2023-06-02 (×11): 40 mg via SUBCUTANEOUS
  Filled 2023-05-23 (×10): qty 0.4

## 2023-05-23 MED ORDER — GUAIFENESIN-DM 100-10 MG/5ML PO SYRP: 10.0000 mL | ORAL_SOLUTION | Freq: Four times a day (QID) | ORAL | Status: DC | PRN

## 2023-05-23 MED ORDER — MIRTAZAPINE 15 MG PO TABS
30.0000 mg | ORAL_TABLET | Freq: Every day | ORAL | Status: DC
  Administered 2023-05-23 – 2023-06-02 (×11): 30 mg via ORAL
  Filled 2023-05-23 (×11): qty 2

## 2023-05-23 MED ORDER — TAMSULOSIN HCL 0.4 MG PO CAPS
0.4000 mg | ORAL_CAPSULE | ORAL | Status: DC
  Administered 2023-05-23 – 2023-06-02 (×6): 0.4 mg via ORAL
  Filled 2023-05-23 (×8): qty 1

## 2023-05-23 MED ORDER — ALUM & MAG HYDROXIDE-SIMETH 200-200-20 MG/5ML PO SUSP: 30.0000 mL | ORAL | Status: DC | PRN

## 2023-05-23 MED ORDER — DOCUSATE SODIUM 100 MG PO CAPS
100.0000 mg | ORAL_CAPSULE | Freq: Two times a day (BID) | ORAL | Status: DC
  Administered 2023-05-23 – 2023-05-28 (×10): 100 mg via ORAL
  Filled 2023-05-23 (×10): qty 1

## 2023-05-23 MED ORDER — FINASTERIDE 5 MG PO TABS
5.0000 mg | ORAL_TABLET | Freq: Every morning | ORAL | Status: DC
  Administered 2023-05-24 – 2023-05-29 (×6): 5 mg via ORAL
  Filled 2023-05-23 (×6): qty 1

## 2023-05-23 MED ORDER — SORBITOL 70 % SOLN: 30.0000 mL | Freq: Every day | Status: DC | PRN

## 2023-05-23 MED ORDER — SENNA 8.6 MG PO TABS
1.0000 | ORAL_TABLET | Freq: Every day | ORAL | Status: DC
Start: 2023-05-24 — End: 2023-06-03
  Administered 2023-05-24 – 2023-06-03 (×11): 8.6 mg via ORAL
  Filled 2023-05-23 (×11): qty 1

## 2023-05-23 MED ORDER — ARIPIPRAZOLE 2 MG PO TABS
2.0000 mg | ORAL_TABLET | Freq: Every day | ORAL | Status: DC
Start: 2023-05-23 — End: 2023-06-03
  Administered 2023-05-23 – 2023-06-02 (×11): 2 mg via ORAL
  Filled 2023-05-23 (×11): qty 1

## 2023-05-23 MED ORDER — OXYCODONE HCL 5 MG PO TABS
5.0000 mg | ORAL_TABLET | ORAL | Status: DC | PRN
Start: 2023-05-23 — End: 2023-06-03
  Administered 2023-05-24: 10 mg via ORAL
  Administered 2023-05-24: 5 mg via ORAL
  Administered 2023-05-25: 10 mg via ORAL
  Administered 2023-05-26 (×2): 5 mg via ORAL
  Administered 2023-05-28: 10 mg via ORAL
  Filled 2023-05-23 (×2): qty 2
  Filled 2023-05-23: qty 1
  Filled 2023-05-23: qty 2
  Filled 2023-05-23 (×2): qty 1

## 2023-05-23 MED ORDER — FLEET ENEMA RE ENEM: 1.0000 | ENEMA | Freq: Once | RECTAL | Status: DC | PRN

## 2023-05-23 MED ORDER — DULOXETINE HCL 30 MG PO CPEP
120.0000 mg | ORAL_CAPSULE | Freq: Every day | ORAL | Status: DC
  Administered 2023-05-24 – 2023-06-03 (×11): 120 mg via ORAL
  Filled 2023-05-23 (×12): qty 4

## 2023-05-23 NOTE — Discharge Summary (Signed)
Physician Discharge Summary   Patient: Jeremy [name] MRN: [id number] DOB: March 01, 1936  Admit date:     05/20/2023  Discharge date: 05/23/23  Discharge Physician: Thad Ranger, MD    PCP: Irena Reichmann, DO   Recommendations at discharge:   Up with assistance and working on mobility with weightbearing as tolerated with his right anterior hip.   Discharge Diagnoses:    Closed right hip fracture (HCC)   Normocytic anemia   Bronchiectasis (HCC)   First degree AV block   Anxiety and depression   BPH (benign prostatic hyperplasia)   Closed subcapital fracture of right femur (HCC)   Malnutrition of moderate degree    Hospital Course:  Jeremy [name] is a 87 y.o. male with medical history significant of aortic atherosclerosis, BPH, bronchiectasis, depression, anxiety presented to the ED with right hip pain after mechanical fall.  Vital signs stable.  Labs notable for hemoglobin 11.9 (previously >14 on labs done about 2 years ago), MCV 92.7.  X-ray of right hip showing right hip displaced femoral neck fracture.  Plan for right hip total arthroplasty 12/17 p.m.   Assessment and Plan:  Right hip fracture/right hip displaced subcapital femoral neck fracture -Due to mechanical fall -Orthopedics was consulted, s/p total right hip arthroplasty 12/17 -Continue pain control, bowel regimen with opioids.  -Per orthopedics, Dr. Magnus Ivan, continue PT OT, weightbearing as tolerated with right anterior hip -Follow-up outpatient in 2 weeks after discharge -Patient accepted to inpatient rehab   Acute blood loss anemia, postoperatively, normocytic anemia -H&H is stable, 9.1   Bronchiectasis No signs or symptoms of exacerbation   First-degree AV block TSH within normal limits   Depression and anxiety Continue home medications   BPH Finasteride and flomax         Pain control - [place] Controlled Substance Reporting System database was reviewed. and patient was instructed, not to drive,  operate heavy machinery, perform activities at heights, swimming or participation in water activities or provide baby-sitting services while on Pain, Sleep and Anxiety Medications; until their outpatient Physician has advised to do so again. Also recommended to not to take more than prescribed Pain, Sleep and Anxiety Medications.  Consultants: Orthopedics Procedures performed: Total hip arthroplasty Disposition: CIR Diet recommendation:  Discharge Diet Orders (From admission, onward)     Start     Ordered   05/23/23 0000  Diet - low sodium heart healthy        05/23/23 0946            DISCHARGE MEDICATION: Allergies as of 05/23/2023       Reactions   Aspergillus Allergy Skin Test Cough   Aspirin    Irritates stomach   Sulfa Antibiotics Other (See Comments)   Childhood    Theophyllines Other (See Comments)   Does not remember        Medication List     TAKE these medications    ARIPiprazole 2 MG tablet Commonly known as: ABILIFY TAKE 1 TABLET BY MOUTH EVERY DAY   clotrimazole-betamethasone cream Commonly known as: LOTRISONE APPLY TO AFFECTED AREA TWICE A DAY What changed: See the new instructions.   DULoxetine 60 MG capsule Commonly known as: CYMBALTA Take 2 capsules (120 mg total) by mouth daily.   finasteride 5 MG tablet Commonly known as: PROSCAR Take 5 mg by mouth every morning.   LORazepam 0.5 MG tablet Commonly known as: Ativan Take 1/2-1 tablet twice daily as needed for severe anxiety/panic   methocarbamol 500 MG tablet Commonly  known as: ROBAXIN Take 1 tablet (500 mg total) by mouth every 6 (six) hours as needed for muscle spasms.   mirtazapine 30 MG tablet Commonly known as: REMERON TAKE 1 TABLET BY MOUTH AT BEDTIME.   polyethylene glycol 17 g packet Commonly known as: MIRALAX / GLYCOLAX Take 17 g by mouth every other day.   RisperDAL 3 MG tablet Generic drug: risperiDONE Take 3 mg by mouth daily.   tamsulosin 0.4 MG Caps  capsule Commonly known as: FLOMAX Take 0.4 mg by mouth every evening. Every other day               Durable Medical Equipment  (From admission, onward)           Start     Ordered   05/21/23 1513  DME 3 n 1  Once        05/21/23 1512   05/21/23 1513  DME Walker rolling  Once       Question Answer Comment  Walker: With 5 Inch Wheels   Patient needs a walker to treat with the following condition Status post total replacement of right hip      05/21/23 1512            Follow-up Information     Kathryne Hitch, MD. Schedule an appointment as soon as possible for a visit in 2 week(s).   Specialty: Orthopedic Surgery Contact information: [address] [place] [address] [phone]                Discharge Exam: Ceasar Mons Weights   05/20/23 1733  Weight: 49.9 kg   S: No acute complaints, accepted to inpatient rehab  BP (!) 122/52 (BP Location: Right Arm)   Pulse 81   Temp 97.7 F (36.5 C) (Oral)   Resp 17   Ht 5\' 6"  (1.676 m)   Wt 49.9 kg   SpO2 97%   BMI 17.75 kg/m   Physical Exam General: Alert and oriented x 3, NAD Cardiovascular: S1 S2 clear, RRR.  Respiratory: CTAB, no wheezing Gastrointestinal: Soft, nontender, nondistended, NBS Ext: no pedal edema bilaterally Neuro: no new deficits Psych: flat affect   Condition at discharge: fair  The results of significant diagnostics from this hospitalization (including imaging, microbiology, ancillary and laboratory) are listed below for reference.   Imaging Studies: DG Pelvis Portable Result Date: 05/21/2023 CLINICAL DATA:  Status post right hip replacement. EXAM: PORTABLE PELVIS 1-2 VIEWS COMPARISON:  Radiograph yesterday FINDINGS: Right hip arthroplasty in expected alignment. No periprosthetic lucency or fracture. Recent postsurgical change includes air and edema in the soft tissues. Lateral skin staples in place. IMPRESSION: Right hip arthroplasty without immediate postoperative  complication. Electronically Signed   By: Narda Rutherford M.D.   On: 05/21/2023 15:27   DG HIP UNILAT WITH PELVIS 1V RIGHT Result Date: 05/21/2023 CLINICAL DATA:  Elective surgery. EXAM: DG HIP (WITH OR WITHOUT PELVIS) 1V RIGHT COMPARISON:  Preoperative imaging. FINDINGS: Three fluoroscopic spot views of the pelvis and right hip obtained in the operating room. Images during hip arthroplasty. Fluoroscopy time 13.4 seconds. Dose 1.24 mGy. IMPRESSION: Intraoperative fluoroscopy during right hip arthroplasty. Electronically Signed   By: Narda Rutherford M.D.   On: 05/21/2023 15:24   DG C-Arm 1-60 Min-No Report Result Date: 05/21/2023 Fluoroscopy was utilized by the requesting physician.  No radiographic interpretation.   Chest Portable 1 View Result Date: 05/20/2023 CLINICAL DATA:  Presents from home after a fall. Right femoral neck fracture. Preop chest exam. EXAM:  PORTABLE CHEST 1 VIEW COMPARISON:  05/13/2013 FINDINGS: Stable cardiomediastinal silhouette. Aortic atherosclerotic calcification. Mild hyperinflation and chronic bronchitic change. No focal consolidation, pleural effusion, or pneumothorax. No displaced rib fractures. IMPRESSION: No acute cardiopulmonary process. Electronically Signed   By: Minerva Fester M.D.   On: 05/20/2023 22:24   DG Hip Unilat With Pelvis 2-3 Views Right Result Date: 05/20/2023 CLINICAL DATA:  Fall, hip pain EXAM: DG HIP (WITH OR WITHOUT PELVIS) 2-3V RIGHT COMPARISON:  CT 06/04/2021 FINDINGS: SI joints are non widened. Pubic symphysis and rami appear intact. Acute right femoral neck fracture. No femoral head dislocation IMPRESSION: Acute right femoral neck fracture. Electronically Signed   By: Jasmine Pang M.D.   On: 05/20/2023 20:21    Microbiology: Results for orders placed or performed during the hospital encounter of 05/20/23  Surgical pcr screen     Status: None   Collection Time: 05/21/23 11:54 AM   Specimen: Nasal Mucosa; Nasal Swab  Result Value Ref  Range Status   MRSA, PCR NEGATIVE NEGATIVE Final   Staphylococcus aureus NEGATIVE NEGATIVE Final    Comment: (NOTE) The Xpert SA Assay (FDA approved for NASAL specimens in patients 65 years of age and older), is one component of a comprehensive surveillance program. It is not intended to diagnose infection nor to guide or monitor treatment. Performed at [facility], [address] [place], [place] [address]     Labs: CBC: Recent Labs  Lab 05/20/23 1811 05/21/23 0350 05/22/23 0643 05/23/23 0557  WBC 6.9 9.9 11.3* 12.1*  NEUTROABS 4.8  --   --   --   HGB 11.9* 11.1* 9.6* 9.1*  HCT 35.6* 33.1* 28.3* 26.8*  MCV 92.7 92.7 92.5 91.5  PLT 232 214 170 155   Basic Metabolic Panel: Recent Labs  Lab 05/20/23 1811 05/21/23 0350 05/23/23 0557  NA 138 136 132*  K 4.1 3.9 3.7  CL 102 106 100  CO2 26 23 24   GLUCOSE 106* 131* 117*  BUN 15 14 13   CREATININE 0.74 0.79 0.94  CALCIUM 9.1 8.6* 8.3*   Liver Function Tests: Recent Labs  Lab 05/21/23 0350  AST 20  ALT 16  ALKPHOS 48  BILITOT 0.6  PROT 5.6*  ALBUMIN 3.1*   CBG: No results for input(s): "GLUCAP" in the last 168 hours.  Discharge time spent: greater than 30 minutes.  Signed: Thad Ranger, MD [facility] 05/23/2023

## 2023-05-23 NOTE — Progress Notes (Signed)
Inpatient Rehab Admissions Coordinator:   I have a bed available for this patient to admit to CIR today.  Dr> Rai in agreement and TOC aware.  I will meet with pt/family at bedside to update and make arrangements to admit today.    Estill Dooms, PT, DPT Admissions Coordinator [phone] 05/23/23  9:47 AM

## 2023-05-23 NOTE — H&P (Signed)
Physical Medicine and Rehabilitation Admission H&P     CC: Functional deficits secondary to right hip fracture   HPI: Jeremy [name] is an 87 year old L handed male who sustained a mechanical fall at home on 05/20/2023. Imaging significant for displaced right femoral neck fracture. No other injuries. Orthopedic surgery consulted and admited to [facility] service. Past medical history significant for BPH, bronchiectasis,  severe depression, anxiety and aortic atherosclerosis. Underwent right hip arthroplasty by Dr. Magnus Ivan on 12/17. Labs stable. Serum iron 10. EKG: first-degree AV block. WBAT. Tolerating diet. The patient requires inpatient medicine and rehabilitation evaluations and services for ongoing dysfunction secondary to right hip fracture.   Wife at bedside.     Pt reports has chronic low back pain that he doesn't take meds for at home as well- his pain is 5/10 at rest, but with therapy it's 7/10 with movement.  LBM this AM (pt said yesterday- wife corrected him).  Said using urinal and toilet for urination.    Working on eating his soup for lunch.      Review of Systems  Constitutional:  Positive for malaise/fatigue. Negative for chills and fever.  HENT: Negative.  Negative for hearing loss.   Eyes: Negative.   Respiratory: Negative.    Cardiovascular: Negative.  Negative for chest pain.  Gastrointestinal:  Positive for nausea. Negative for abdominal pain, constipation and vomiting.  Genitourinary:  Positive for frequency. Negative for dysuria.  Musculoskeletal:  Positive for back pain, falls and joint pain.  Skin: Negative.   Neurological:  Positive for tremors and focal weakness. Negative for dizziness.  Psychiatric/Behavioral:  Positive for depression. The patient is nervous/anxious.   All other systems reviewed and are negative.       Past Medical History:  Diagnosis Date   Aortic atherosclerosis (HCC)     BPH (benign prostatic hyperplasia)     Bronchiectasis (HCC)      Depression     Depression     GAD (generalized anxiety disorder)               Past Surgical History:  Procedure Laterality Date   APPENDECTOMY       HERNIA REPAIR       TOTAL HIP ARTHROPLASTY Right 05/21/2023    Procedure: RIGHT TOTAL HIP ARTHROPLASTY;  Surgeon: Kathryne Hitch, MD;  Location: [facility] OR;  Service: Orthopedics;  Laterality: Right;             Family History  Problem Relation Age of Onset   Diabetes Mother     Colon cancer Neg Hx     Stomach cancer Neg Hx          Social History:  reports that he has never smoked. He has never used smokeless tobacco. He reports that he does not currently use alcohol after a past usage of about 4.0 standard drinks of alcohol per week. He reports that he does not use drugs. Allergies:  Allergies       Allergies  Allergen Reactions   Aspergillus Allergy Skin Test Cough   Aspirin        Irritates stomach   Sulfa Antibiotics Other (See Comments)      Childhood    Theophyllines Other (See Comments)      Does not remember            Medications Prior to Admission  Medication Sig Dispense Refill   clotrimazole-betamethasone (LOTRISONE) cream APPLY TO AFFECTED AREA TWICE A DAY (Patient taking differently: Apply 1  Application topically daily.) 30 g 0   DULoxetine (CYMBALTA) 60 MG capsule Take 2 capsules (120 mg total) by mouth daily. 180 capsule 1   finasteride (PROSCAR) 5 MG tablet Take 5 mg by mouth every morning.       LORazepam (ATIVAN) 0.5 MG tablet Take 1/2-1 tablet twice daily as needed for severe anxiety/panic 60 tablet 1   mirtazapine (REMERON) 30 MG tablet TAKE 1 TABLET BY MOUTH AT BEDTIME. 90 tablet 1   polyethylene glycol (MIRALAX / GLYCOLAX) 17 g packet Take 17 g by mouth every other day.       risperiDONE (RISPERDAL) 3 MG tablet Take 3 mg by mouth daily.       tamsulosin (FLOMAX) 0.4 MG CAPS capsule Take 0.4 mg by mouth every evening. Every other day                  Home: Home Living Family/patient  expects to be discharged to:: Private residence Living Arrangements: Spouse/significant other Available Help at Discharge: Family, Available 24 hours/day Type of Home: House Home Access: Stairs to enter [employer] of Steps: 2-3 Entrance Stairs-Rails: Right Home Layout: Able to live on main level with bedroom/bathroom Bathroom Shower/Tub: Engineer, manufacturing systems: Standard Home Equipment: Rollator (4 wheels)   Functional History: Prior Function Prior Level of Function : Needs assist Mobility Comments: reports h/o significant depression results in him staying in bed most of day; able to walk household distances indep without DME. reports 4 falls in the past ~4 months ADLs Comments: wife assists with dressing and bathing; pt reports indep with toileting. wife performs majority of iADLs; wife drives   Functional Status:  Mobility: Bed Mobility Overal bed mobility: Needs Assistance Bed Mobility: Supine to Sit, Sit to Supine Supine to sit: HOB elevated, Contact guard Sit to supine: Min assist, Used rails General bed mobility comments: min assist for R LE mgmt and increased time, pt able to use gait belt as leg lifter strap to sit up with slightly less assist needed than in previous session. Transfers Overall transfer level: Needs assistance Equipment used: Rolling walker (2 wheels) Transfers: Sit to/from Stand, Bed to chair/wheelchair/BSC Sit to Stand: Min assist Bed to/from chair/wheelchair/BSC transfer type:: Step pivot Step pivot transfers: Min assist General transfer comment: min assist to power up fully and steady, stepping towards L to recliner with min assist Ambulation/Gait Ambulation/Gait assistance: Min assist Gait Distance (Feet): 60 Feet (59ft, seated break, 54ft, seated break, 41ft) Assistive device: Rolling walker (2 wheels) Gait Pattern/deviations: Step-through pattern, Decreased stride length, Trunk flexed, Decreased dorsiflexion - right, Decreased  weight shift to right, Decreased stance time - right General Gait Details: mildly antalgic gait pattern but good effort and improved standing tolerance this date. Initial drop in BP from sit>stand but improved after standing activity/gait trial. Pt with excellent participation, needs cues for safe RW management and activity pacing.   ADL: ADL Overall ADL's : Needs assistance/impaired Grooming: Sitting, Set up Upper Body Dressing : Minimal assistance, Sitting Lower Body Dressing: Total assistance, Sit to/from stand Lower Body Dressing Details (indicate cue type and reason): requires assist for socks, min assist to stand and relies on BUE support Toilet Transfer: Minimal assistance Toilet Transfer Details (indicate cue type and reason): step pivot to recliner using RW Functional mobility during ADLs: Minimal assistance, Rolling walker (2 wheels), Cueing for sequencing, Cueing for safety   Cognition: Cognition Overall Cognitive Status: Within Functional Limits for tasks assessed Orientation Level: Oriented X4 Cognition Arousal: Alert Behavior During  Therapy: Flat affect Overall Cognitive Status: Within Functional Limits for tasks assessed General Comments: Pt with sometimes slow processing, mild impulsivity and requires cueing for safety (attempting to get up before therapist is ready, or without RW) Anticipate this is baseline. Pt responds well to direct questions about his social history and hobbies, just appears reserved.   Physical Exam: Blood pressure (!) 122/52, pulse 81, temperature 97.7 F (36.5 C), temperature source Oral, resp. rate 17, height 5\' 6"  (1.676 m), weight 49.9 kg, SpO2 97%. Physical Exam Vitals and nursing note reviewed. Exam conducted with a chaperone present.  Constitutional:      General: He is not in acute distress.    Comments: Pt sitting up in bed- very flat affect- frail/almost cachetic appearing; wife at bedside; eating soup, NAD  HENT:     Head:  Normocephalic and atraumatic.     Right Ear: External ear normal.     Left Ear: External ear normal.     Nose: Nose normal. No congestion.     Mouth/Throat:     Mouth: Mucous membranes are dry.     Pharynx: Oropharynx is clear. No oropharyngeal exudate.  Eyes:     General:        Right eye: No discharge.        Left eye: No discharge.     Extraocular Movements: Extraocular movements intact.  Cardiovascular:     Rate and Rhythm: Normal rate and regular rhythm.     Heart sounds: Normal heart sounds. No murmur heard.    No gallop.  Pulmonary:     Effort: Pulmonary effort is normal. No respiratory distress.     Breath sounds: Normal breath sounds. No wheezing, rhonchi or rales.  Abdominal:     General: Bowel sounds are normal. There is no distension.     Palpations: Abdomen is soft.     Tenderness: There is no abdominal tenderness.  Musculoskeletal:     Cervical back: Neck supple. No tenderness.     Right lower leg: No edema.     Left lower leg: No edema.     Comments: Ue's 5-/5 B/L RLE- HF 2/5; DF/PF 4+/5 LLE- 5-/5 throughout  Skin:    General: Skin is warm.     Comments: Moist; not hot to touch; surgical dressing dry and intact- no signs of drainage through dressing  Neurological:     Mental Status: He is alert and oriented to person, place, and time.  Psychiatric:        Thought Content: Thought content normal.     Comments: Flat affect- extremely flat facies and affect-         Lab Results Last 48 Hours        Results for orders placed or performed during the hospital encounter of 05/20/23 (from the past 48 hours)  Surgical pcr screen     Status: None    Collection Time: 05/21/23 11:54 AM    Specimen: Nasal Mucosa; Nasal Swab  Result Value Ref Range    MRSA, PCR NEGATIVE NEGATIVE    Staphylococcus aureus NEGATIVE NEGATIVE      Comment: (NOTE) The Xpert SA Assay (FDA approved for NASAL specimens in patients 32 years of age and older), is one component of a  comprehensive surveillance program. It is not intended to diagnose infection nor to guide or monitor treatment. Performed at [facility], [address] [place], [place] [address]    CBC     Status: Abnormal  Collection Time: 05/22/23  6:43 AM  Result Value Ref Range    WBC 11.3 (H) 4.0 - 10.5 K/uL    RBC 3.06 (L) 4.22 - 5.81 MIL/uL    Hemoglobin 9.6 (L) 13.0 - 17.0 g/dL    HCT 21.3 (L) 08.6 - 52.0 %    MCV 92.5 80.0 - 100.0 fL    MCH 31.4 26.0 - 34.0 pg    MCHC 33.9 30.0 - 36.0 g/dL    RDW 57.8 46.9 - 62.9 %    Platelets 170 150 - 400 K/uL    nRBC 0.0 0.0 - 0.2 %      Comment: Performed at [facility], [address] [place], [place] [address]  CBC     Status: Abnormal    Collection Time: 05/23/23  5:57 AM  Result Value Ref Range    WBC 12.1 (H) 4.0 - 10.5 K/uL    RBC 2.93 (L) 4.22 - 5.81 MIL/uL    Hemoglobin 9.1 (L) 13.0 - 17.0 g/dL    HCT 32.4 (L) 40.1 - 52.0 %    MCV 91.5 80.0 - 100.0 fL    MCH 31.1 26.0 - 34.0 pg    MCHC 34.0 30.0 - 36.0 g/dL    RDW 02.7 25.3 - 66.4 %    Platelets 155 150 - 400 K/uL    nRBC 0.0 0.0 - 0.2 %      Comment: Performed at [facility], [address] [place], [place] [address]  Basic metabolic panel     Status: Abnormal    Collection Time: 05/23/23  5:57 AM  Result Value Ref Range    Sodium 132 (L) 135 - 145 mmol/L    Potassium 3.7 3.5 - 5.1 mmol/L    Chloride 100 98 - 111 mmol/L    CO2 24 22 - 32 mmol/L    Glucose, Bld 117 (H) 70 - 99 mg/dL      Comment: Glucose reference range applies only to samples taken after fasting for at least 8 hours.    BUN 13 8 - 23 mg/dL    Creatinine, Ser 4.25 0.61 - 1.24 mg/dL    Calcium 8.3 (L) 8.9 - 10.3 mg/dL    GFR, Estimated >95 >63 mL/min      Comment: (NOTE) Calculated using the CKD-EPI Creatinine Equation (2021)      Anion gap 8 5 - 15      Comment: Performed at [facility], [address] [place], [place] [address]  Iron and TIBC     Status: Abnormal     Collection Time: 05/23/23  5:57 AM  Result Value Ref Range    Iron 10 (L) 45 - 182 ug/dL    TIBC 332 (L) 951 - 884 ug/dL    Saturation Ratios 5 (L) 17.9 - 39.5 %    UIBC 193 ug/dL      Comment: Performed at [facility], [address] [place], [place] [address]  Ferritin     Status: None    Collection Time: 05/23/23  5:57 AM  Result Value Ref Range    Ferritin 190 24 - 336 ng/mL      Comment: Performed at [facility], [address], [place], [place] [address]       Imaging Results (Last 48 hours)  DG Pelvis Portable Result Date: 05/21/2023 CLINICAL DATA:  Status post right hip replacement. EXAM: PORTABLE PELVIS 1-2 VIEWS COMPARISON:  Radiograph yesterday FINDINGS:  Right hip arthroplasty in expected alignment. No periprosthetic lucency or fracture. Recent postsurgical change includes air and edema in the soft tissues. Lateral skin staples in place. IMPRESSION: Right hip arthroplasty without immediate postoperative complication. Electronically Signed   By: Narda Rutherford M.D.   On: 05/21/2023 15:27    DG HIP UNILAT WITH PELVIS 1V RIGHT Result Date: 05/21/2023 CLINICAL DATA:  Elective surgery. EXAM: DG HIP (WITH OR WITHOUT PELVIS) 1V RIGHT COMPARISON:  Preoperative imaging. FINDINGS: Three fluoroscopic spot views of the pelvis and right hip obtained in the operating room. Images during hip arthroplasty. Fluoroscopy time 13.4 seconds. Dose 1.24 mGy. IMPRESSION: Intraoperative fluoroscopy during right hip arthroplasty. Electronically Signed   By: Narda Rutherford M.D.   On: 05/21/2023 15:24    DG C-Arm 1-60 Min-No Report Result Date: 05/21/2023 Fluoroscopy was utilized by the requesting physician.  No radiographic interpretation.           Blood pressure (!) 122/52, pulse 81, temperature 97.7 F (36.5 C), temperature source Oral, resp. rate 17, height 5\' 6"  (1.676 m), weight 49.9 kg, SpO2 97%.   Medical Problem List and Plan: 1. Functional deficits secondary to R hip  fx s/p THA WBAT             -patient may  shower- cover incision/dressing             -ELOS/Goals: 14-18 days supervision   2.  Antithrombotics: -DVT/anticoagulation:  Pharmaceutical: Lovenox start on CIR admission             -antiplatelet therapy: plan aspirin 81 mg BID at discharge             -check BLE venous duplex   3. Pain Management: Tylenol 650 mg q 6 hours -Robaxin as needed -oxycodone as needed   4. Mood/Behavior/Sleep: LCSW to evaluate and provide emotional support             -continue Abilify 2 mg q HS             -continue Cymbalta DR 120 mg daily             -continue Remeron 30 mg q HS             -antipsychotic agents: n/a   5. Neuropsych/cognition: This patient is capable of making decisions on his own behalf.   6. Skin/Wound Care: Routine skin care checks             -monitor surgical incision (has surgical staples)   7. Fluids/Electrolytes/Nutrition: Routine Is and Os and follow-up chemistries   8: BPH: continue Proscar 5 mg q AM and Flomax 0.4 mg q 48 hours   9: Right hip displaced subcapital femoral neck fracture s/p right direct anterior total hip arthroplasty 05/21/2023 by Dr. Magnus Ivan             -WBAT             -follow-up in 2 weeks   10: ABLA/iron deficiency:              -consider oral iron supplementation             -follow-up CBC   11: Hyponatremia: follow-up BMP   12: Leukocytosis: febrile to 100.4 at ~4 am; currently afebrile             -monitor and follow-up CBC with diff             -FV/IS       Lynnell Jude  Setzer, PA-C 05/23/2023     I have personally performed a face to face diagnostic evaluation of this patient and formulated the key components of the plan.  Additionally, I have personally reviewed laboratory data, imaging studies, as well as relevant notes and concur with the physician assistant's documentation above.   The patient's status has not changed from the original H&P.  Any changes in documentation from the acute  care chart have been noted above.

## 2023-05-23 NOTE — Care Management Important Message (Signed)
Important Message  Patient Details  Name: Jeremy [name] MRN: [id number] Date of Birth: 10-Dec-1935   Important Message Given:  Yes - Medicare IM Patient advised me to send a copy of the IM to the home address.     Hagen Bohorquez 05/23/2023, 3:00 PM

## 2023-05-23 NOTE — H&P (Signed)
Physical Medicine and Rehabilitation Admission H&P   CC: Functional deficits secondary to right hip fracture  HPI: Jeremy [name] is an 87 year old L handed male who sustained a mechanical fall at home on 05/20/2023. Imaging significant for displaced right femoral neck fracture. No other injuries. Orthopedic surgery consulted and admited to [facility] service. Past medical history significant for BPH, bronchiectasis,  severe depression, anxiety and aortic atherosclerosis. Underwent right hip arthroplasty by Dr. Magnus Ivan on 12/17. Labs stable. Serum iron 10. EKG: first-degree AV block. WBAT. Tolerating diet. The patient requires inpatient medicine and rehabilitation evaluations and services for ongoing dysfunction secondary to right hip fracture.  Wife at bedside.   Pt reports has chronic low back pain that he doesn't take meds for at home as well- his pain is 5/10 at rest, but with therapy it's 7/10 with movement.  LBM this AM (pt said yesterday- wife corrected him).  Said using urinal and toilet for urination.   Working on eating his soup for lunch.    Review of Systems  Constitutional:  Positive for malaise/fatigue. Negative for chills and fever.  HENT: Negative.  Negative for hearing loss.   Eyes: Negative.   Respiratory: Negative.    Cardiovascular: Negative.  Negative for chest pain.  Gastrointestinal:  Positive for nausea. Negative for abdominal pain, constipation and vomiting.  Genitourinary:  Positive for frequency. Negative for dysuria.  Musculoskeletal:  Positive for back pain, falls and joint pain.  Skin: Negative.   Neurological:  Positive for tremors and focal weakness. Negative for dizziness.  Psychiatric/Behavioral:  Positive for depression. The patient is nervous/anxious.   All other systems reviewed and are negative.  Past Medical History:  Diagnosis Date   Aortic atherosclerosis (HCC)    BPH (benign prostatic hyperplasia)    Bronchiectasis (HCC)    Depression    Depression     GAD (generalized anxiety disorder)    Past Surgical History:  Procedure Laterality Date   APPENDECTOMY     HERNIA REPAIR     TOTAL HIP ARTHROPLASTY Right 05/21/2023   Procedure: RIGHT TOTAL HIP ARTHROPLASTY;  Surgeon: Kathryne Hitch, MD;  Location: [facility] OR;  Service: Orthopedics;  Laterality: Right;   Family History  Problem Relation Age of Onset   Diabetes Mother    Colon cancer Neg Hx    Stomach cancer Neg Hx    Social History:  reports that he has never smoked. He has never used smokeless tobacco. He reports that he does not currently use alcohol after a past usage of about 4.0 standard drinks of alcohol per week. He reports that he does not use drugs. Allergies:  Allergies  Allergen Reactions   Aspergillus Allergy Skin Test Cough   Aspirin     Irritates stomach   Sulfa Antibiotics Other (See Comments)    Childhood    Theophyllines Other (See Comments)    Does not remember   Medications Prior to Admission  Medication Sig Dispense Refill   clotrimazole-betamethasone (LOTRISONE) cream APPLY TO AFFECTED AREA TWICE A DAY (Patient taking differently: Apply 1 Application topically daily.) 30 g 0   DULoxetine (CYMBALTA) 60 MG capsule Take 2 capsules (120 mg total) by mouth daily. 180 capsule 1   finasteride (PROSCAR) 5 MG tablet Take 5 mg by mouth every morning.     LORazepam (ATIVAN) 0.5 MG tablet Take 1/2-1 tablet twice daily as needed for severe anxiety/panic 60 tablet 1   mirtazapine (REMERON) 30 MG tablet TAKE 1 TABLET BY MOUTH AT BEDTIME. 90  tablet 1   polyethylene glycol (MIRALAX / GLYCOLAX) 17 g packet Take 17 g by mouth every other day.     risperiDONE (RISPERDAL) 3 MG tablet Take 3 mg by mouth daily.     tamsulosin (FLOMAX) 0.4 MG CAPS capsule Take 0.4 mg by mouth every evening. Every other day        Home: Home Living Family/patient expects to be discharged to:: Private residence Living Arrangements: Spouse/significant other Available Help at Discharge:  Family, Available 24 hours/day Type of Home: House Home Access: Stairs to enter [employer] of Steps: 2-3 Entrance Stairs-Rails: Right Home Layout: Able to live on main level with bedroom/bathroom Bathroom Shower/Tub: Engineer, manufacturing systems: Standard Home Equipment: Rollator (4 wheels)   Functional History: Prior Function Prior Level of Function : Needs assist Mobility Comments: reports h/o significant depression results in him staying in bed most of day; able to walk household distances indep without DME. reports 4 falls in the past ~4 months ADLs Comments: wife assists with dressing and bathing; pt reports indep with toileting. wife performs majority of iADLs; wife drives  Functional Status:  Mobility: Bed Mobility Overal bed mobility: Needs Assistance Bed Mobility: Supine to Sit, Sit to Supine Supine to sit: HOB elevated, Contact guard Sit to supine: Min assist, Used rails General bed mobility comments: min assist for R LE mgmt and increased time, pt able to use gait belt as leg lifter strap to sit up with slightly less assist needed than in previous session. Transfers Overall transfer level: Needs assistance Equipment used: Rolling walker (2 wheels) Transfers: Sit to/from Stand, Bed to chair/wheelchair/BSC Sit to Stand: Min assist Bed to/from chair/wheelchair/BSC transfer type:: Step pivot Step pivot transfers: Min assist General transfer comment: min assist to power up fully and steady, stepping towards L to recliner with min assist Ambulation/Gait Ambulation/Gait assistance: Min assist Gait Distance (Feet): 60 Feet (65ft, seated break, 59ft, seated break, 25ft) Assistive device: Rolling walker (2 wheels) Gait Pattern/deviations: Step-through pattern, Decreased stride length, Trunk flexed, Decreased dorsiflexion - right, Decreased weight shift to right, Decreased stance time - right General Gait Details: mildly antalgic gait pattern but good effort and  improved standing tolerance this date. Initial drop in BP from sit>stand but improved after standing activity/gait trial. Pt with excellent participation, needs cues for safe RW management and activity pacing.    ADL: ADL Overall ADL's : Needs assistance/impaired Grooming: Sitting, Set up Upper Body Dressing : Minimal assistance, Sitting Lower Body Dressing: Total assistance, Sit to/from stand Lower Body Dressing Details (indicate cue type and reason): requires assist for socks, min assist to stand and relies on BUE support Toilet Transfer: Minimal assistance Toilet Transfer Details (indicate cue type and reason): step pivot to recliner using RW Functional mobility during ADLs: Minimal assistance, Rolling walker (2 wheels), Cueing for sequencing, Cueing for safety  Cognition: Cognition Overall Cognitive Status: Within Functional Limits for tasks assessed Orientation Level: Oriented X4 Cognition Arousal: Alert Behavior During Therapy: Flat affect Overall Cognitive Status: Within Functional Limits for tasks assessed General Comments: Pt with sometimes slow processing, mild impulsivity and requires cueing for safety (attempting to get up before therapist is ready, or without RW) Anticipate this is baseline. Pt responds well to direct questions about his social history and hobbies, just appears reserved.  Physical Exam: Blood pressure (!) 122/52, pulse 81, temperature 97.7 F (36.5 C), temperature source Oral, resp. rate 17, height 5\' 6"  (1.676 m), weight 49.9 kg, SpO2 97%. Physical Exam Vitals and nursing note reviewed.  Exam conducted with a chaperone present.  Constitutional:      General: He is not in acute distress.    Comments: Pt sitting up in bed- very flat affect- frail/almost cachetic appearing; wife at bedside; eating soup, NAD  HENT:     Head: Normocephalic and atraumatic.     Right Ear: External ear normal.     Left Ear: External ear normal.     Nose: Nose normal. No  congestion.     Mouth/Throat:     Mouth: Mucous membranes are dry.     Pharynx: Oropharynx is clear. No oropharyngeal exudate.  Eyes:     General:        Right eye: No discharge.        Left eye: No discharge.     Extraocular Movements: Extraocular movements intact.  Cardiovascular:     Rate and Rhythm: Normal rate and regular rhythm.     Heart sounds: Normal heart sounds. No murmur heard.    No gallop.  Pulmonary:     Effort: Pulmonary effort is normal. No respiratory distress.     Breath sounds: Normal breath sounds. No wheezing, rhonchi or rales.  Abdominal:     General: Bowel sounds are normal. There is no distension.     Palpations: Abdomen is soft.     Tenderness: There is no abdominal tenderness.  Musculoskeletal:     Cervical back: Neck supple. No tenderness.     Right lower leg: No edema.     Left lower leg: No edema.     Comments: Ue's 5-/5 B/L RLE- HF 2/5; DF/PF 4+/5 LLE- 5-/5 throughout  Skin:    General: Skin is warm.     Comments: Moist; not hot to touch; surgical dressing dry and intact- no signs of drainage through dressing  Neurological:     Mental Status: He is alert and oriented to person, place, and time.  Psychiatric:        Thought Content: Thought content normal.     Comments: Flat affect- extremely flat facies and affect-      Results for orders placed or performed during the hospital encounter of 05/20/23 (from the past 48 hours)  Surgical pcr screen     Status: None   Collection Time: 05/21/23 11:54 AM   Specimen: Nasal Mucosa; Nasal Swab  Result Value Ref Range   MRSA, PCR NEGATIVE NEGATIVE   Staphylococcus aureus NEGATIVE NEGATIVE    Comment: (NOTE) The Xpert SA Assay (FDA approved for NASAL specimens in patients 28 years of age and older), is one component of a comprehensive surveillance program. It is not intended to diagnose infection nor to guide or monitor treatment. Performed at [facility], [address] [place],  [place] [address]   CBC     Status: Abnormal   Collection Time: 05/22/23  6:43 AM  Result Value Ref Range   WBC 11.3 (H) 4.0 - 10.5 K/uL   RBC 3.06 (L) 4.22 - 5.81 MIL/uL   Hemoglobin 9.6 (L) 13.0 - 17.0 g/dL   HCT 95.2 (L) 84.1 - 32.4 %   MCV 92.5 80.0 - 100.0 fL   MCH 31.4 26.0 - 34.0 pg   MCHC 33.9 30.0 - 36.0 g/dL   RDW 40.1 02.7 - 25.3 %   Platelets 170 150 - 400 K/uL   nRBC 0.0 0.0 - 0.2 %    Comment: Performed at [facility], [address] [place], [place] [address]  CBC  Status: Abnormal   Collection Time: 05/23/23  5:57 AM  Result Value Ref Range   WBC 12.1 (H) 4.0 - 10.5 K/uL   RBC 2.93 (L) 4.22 - 5.81 MIL/uL   Hemoglobin 9.1 (L) 13.0 - 17.0 g/dL   HCT 82.9 (L) 56.2 - 13.0 %   MCV 91.5 80.0 - 100.0 fL   MCH 31.1 26.0 - 34.0 pg   MCHC 34.0 30.0 - 36.0 g/dL   RDW 86.5 78.4 - 69.6 %   Platelets 155 150 - 400 K/uL   nRBC 0.0 0.0 - 0.2 %    Comment: Performed at [facility], [address] [place], [place] [address]  Basic metabolic panel     Status: Abnormal   Collection Time: 05/23/23  5:57 AM  Result Value Ref Range   Sodium 132 (L) 135 - 145 mmol/L   Potassium 3.7 3.5 - 5.1 mmol/L   Chloride 100 98 - 111 mmol/L   CO2 24 22 - 32 mmol/L   Glucose, Bld 117 (H) 70 - 99 mg/dL    Comment: Glucose reference range applies only to samples taken after fasting for at least 8 hours.   BUN 13 8 - 23 mg/dL   Creatinine, Ser 4.13 0.61 - 1.24 mg/dL   Calcium 8.3 (L) 8.9 - 10.3 mg/dL   GFR, Estimated >24 >40 mL/min    Comment: (NOTE) Calculated using the CKD-EPI Creatinine Equation (2021)    Anion gap 8 5 - 15    Comment: Performed at [facility], [address] [place], [place] [address]  Iron and TIBC     Status: Abnormal   Collection Time: 05/23/23  5:57 AM  Result Value Ref Range   Iron 10 (L) 45 - 182 ug/dL   TIBC 536 (L) 644 - 034 ug/dL   Saturation Ratios 5 (L) 17.9 - 39.5 %   UIBC 193 ug/dL    Comment: Performed at [facility],  [address], [place], [place] [address]  Ferritin     Status: None   Collection Time: 05/23/23  5:57 AM  Result Value Ref Range   Ferritin 190 24 - 336 ng/mL    Comment: Performed at [facility], [address] [place], [place] [address]   DG Pelvis Portable Result Date: 05/21/2023 CLINICAL DATA:  Status post right hip replacement. EXAM: PORTABLE PELVIS 1-2 VIEWS COMPARISON:  Radiograph yesterday FINDINGS: Right hip arthroplasty in expected alignment. No periprosthetic lucency or fracture. Recent postsurgical change includes air and edema in the soft tissues. Lateral skin staples in place. IMPRESSION: Right hip arthroplasty without immediate postoperative complication. Electronically Signed   By: Narda Rutherford M.D.   On: 05/21/2023 15:27   DG HIP UNILAT WITH PELVIS 1V RIGHT Result Date: 05/21/2023 CLINICAL DATA:  Elective surgery. EXAM: DG HIP (WITH OR WITHOUT PELVIS) 1V RIGHT COMPARISON:  Preoperative imaging. FINDINGS: Three fluoroscopic spot views of the pelvis and right hip obtained in the operating room. Images during hip arthroplasty. Fluoroscopy time 13.4 seconds. Dose 1.24 mGy. IMPRESSION: Intraoperative fluoroscopy during right hip arthroplasty. Electronically Signed   By: Narda Rutherford M.D.   On: 05/21/2023 15:24   DG C-Arm 1-60 Min-No Report Result Date: 05/21/2023 Fluoroscopy was utilized by the requesting physician.  No radiographic interpretation.      Blood pressure (!) 122/52, pulse 81, temperature 97.7 F (36.5 C), temperature source Oral, resp. rate 17, height 5\' 6"  (1.676 m), weight 49.9 kg, SpO2 97%.  Medical  Problem List and Plan: 1. Functional deficits secondary to R hip fx s/p THA WBAT  -patient may  shower- cover incision/dressing  -ELOS/Goals: 14-18 days supervision  2.  Antithrombotics: -DVT/anticoagulation:  Pharmaceutical: Lovenox start on CIR admission  -antiplatelet therapy: plan aspirin 81 mg BID at discharge  -check BLE venous duplex  3.  Pain Management: Tylenol 650 mg q 6 hours -Robaxin as needed -oxycodone as needed  4. Mood/Behavior/Sleep: LCSW to evaluate and provide emotional support  -continue Abilify 2 mg q HS  -continue Cymbalta DR 120 mg daily  -continue Remeron 30 mg q HS  -antipsychotic agents: n/a  5. Neuropsych/cognition: This patient is capable of making decisions on his own behalf.  6. Skin/Wound Care: Routine skin care checks  -monitor surgical incision (has surgical staples)   7. Fluids/Electrolytes/Nutrition: Routine Is and Os and follow-up chemistries  8: BPH: continue Proscar 5 mg q AM and Flomax 0.4 mg q 48 hours  9: Right hip displaced subcapital femoral neck fracture s/p right direct anterior total hip arthroplasty 05/21/2023 by Dr. Magnus Ivan  -WBAT  -follow-up in 2 weeks  10: ABLA/iron deficiency:   -consider oral iron supplementation  -follow-up CBC  11: Hyponatremia: follow-up BMP  12: Leukocytosis: febrile to 100.4 at ~4 am; currently afebrile  -monitor and follow-up CBC with diff  -FV/IS    Milinda Antis, PA-C 05/23/2023   I have personally performed a face to face diagnostic evaluation of this patient and formulated the key components of the plan.  Additionally, I have personally reviewed laboratory data, imaging studies, as well as relevant notes and concur with the physician assistant's documentation above.   The patient's status has not changed from the original H&P.  Any changes in documentation from the acute care chart have been noted above.

## 2023-05-23 NOTE — Care Management Important Message (Signed)
Important Message  Patient Details  Name: Jeremy [name] MRN: [id number] Date of Birth: 1935-06-05   Important Message Given:  Yes - Medicare and Tricare IM  Patient advised me to send a copy of the IM to the home address.      Sparkle Aube 05/23/2023, 3:02 PM

## 2023-05-23 NOTE — Progress Notes (Signed)
Genice Rouge, MD  Physician Physical Medicine and Rehabilitation   PMR Pre-admission    Signed   Date of Service: 05/23/2023 10:49 AM  Related encounter: ED to Hosp-Admission (Current) from 05/20/2023 in [facility]   Signed     Expand All Collapse All  PMR Admission Coordinator Pre-Admission Assessment   Patient: Jeremy [name] is an 87 y.o., male MRN: [id number] DOB: 04-18-1936 Height: 5\' 6"  (167.6 cm) Weight: 49.9 kg   Insurance Information HMO:     PPO:      PCP:      IPA:      80/20:      OTHER:  PRIMARY: Medicare A/B      Policy#: [phone]      Subscriber: pt CM Name:       Phone#:      Fax#:  Pre-Cert#: verified Health and safety inspector:  Benefits:  Phone #:      Name:  Eff. Date:  12/03/75 A, 12/03/02 B     Deduct: $6387 (2025: $1676)      Out of Pocket Max: n/a      Life Max: n/a CIR: 100%      SNF: 20 full days Outpatient: 80%     Co-Pay: 20% Home Health: 100%      Co-Pay:  DME: 80%     Co-Pay: 20% Providers:  SECONDARY: BCBS      Policy#: [id number]     Phone#: [phone]   TERTIARY:  Tricare for Life  Policy: [id number]   Phone [phone]   Financial Counselor:       Phone#:    The "Data Collection Information Summary" for patients in Inpatient Rehabilitation Facilities with attached "Privacy Act Statement-Health Care Records" was provided and verbally reviewed with: Patient and Family   Emergency Contact Information Contact Information       Name Relation Home Work Mobile    Ebbs,[name] Spouse [phone]   [phone]         Other Contacts   None on File        Current Medical History  Patient Admitting Diagnosis: R hip fracture s/p THA   History of Present Illness: Pt is an 87 y/o male with PMH of BPH, anxiety, and severe depression who admitted to [facility] on [facility]/20/[facility] following a fall at home.  In ED vitals stable, labs only notable for Hgb 11.9.  Plain films revealed femoral neck fx on the R.   Orthopedics was consulted and recommended surgical fixation.  Pt underwent a R total hip arthroplasty with anterior approach on 12/17 per Dr. Magnus Ivan.  Post op he is WBAT on RLE with no hip precautions.  Post op hospital course pain management.  Therapy evaluations completed and pt was recommended for CIR.    Patient's medical record from [facility] has been reviewed by the rehabilitation admission coordinator and physician.   Past Medical History      Past Medical History:  Diagnosis Date   Aortic atherosclerosis (HCC)     BPH (benign prostatic hyperplasia)     Bronchiectasis (HCC)     Depression     Depression     GAD (generalized anxiety disorder)            Has the patient had major surgery during 100 days prior to admission? Yes   Family History   family history includes Diabetes in his mother.   Current Medications  Current Medications  Current Facility-Administered Medications:    0.9 %  sodium chloride infusion, , Intravenous, Continuous, Kathryne Hitch, MD, Last Rate: 75 mL/hr at 05/21/23 1557, New Bag at 05/21/23 1557   acetaminophen (TYLENOL) tablet 325-650 mg, 325-650 mg, Oral, Q6H PRN, Kathryne Hitch, MD   acetaminophen (TYLENOL) tablet 650 mg, 650 mg, Oral, Q6H, Kathryne Hitch, MD, 650 mg at 05/23/23 0802   ARIPiprazole (ABILIFY) tablet 2 mg, 2 mg, Oral, QHS, Marolyn Haller, MD   aspirin chewable tablet 81 mg, 81 mg, Oral, BID, Kathryne Hitch, MD, 81 mg at 05/23/23 1610   Chlorhexidine Gluconate Cloth 2 % PADS 6 each, 6 each, Topical, Daily, Kathryne Hitch, MD, 6 each at 05/22/23 1734   docusate sodium (COLACE) capsule 100 mg, 100 mg, Oral, BID, Kathryne Hitch, MD, 100 mg at 05/23/23 0802   DULoxetine (CYMBALTA) DR capsule 120 mg, 120 mg, Oral, Daily, Kathryne Hitch, MD, 120 mg at 05/23/23 0803   finasteride (PROSCAR) tablet 5 mg, 5 mg, Oral, q morning, Kathryne Hitch, MD, 5 mg at 05/23/23  0802   HYDROmorphone (DILAUDID) injection 0.5-1 mg, 0.5-1 mg, Intravenous, Q4H PRN, Kathryne Hitch, MD, 0.5 mg at 05/21/23 2158   menthol-cetylpyridinium (CEPACOL) lozenge 3 mg, 1 lozenge, Oral, PRN **OR** phenol (CHLORASEPTIC) mouth spray 1 spray, 1 spray, Mouth/Throat, PRN, Kathryne Hitch, MD   methocarbamol (ROBAXIN) tablet 500 mg, 500 mg, Oral, Q6H PRN, 500 mg at 05/23/23 0803 **OR** methocarbamol (ROBAXIN) injection 500 mg, 500 mg, Intravenous, Q6H PRN, Kathryne Hitch, MD   mirtazapine (REMERON) tablet 30 mg, 30 mg, Oral, QHS, Kathryne Hitch, MD, 30 mg at 05/21/23 2052   naloxone [facility]) injection 0.4 mg, 0.4 mg, Intravenous, PRN, Kathryne Hitch, MD   ondansetron [facility]) tablet 4 mg, 4 mg, Oral, Q6H PRN **OR** ondansetron (ZOFRAN) injection 4 mg, 4 mg, Intravenous, Q6H PRN, Kathryne Hitch, MD   oxyCODONE (Oxy IR/ROXICODONE) immediate release tablet 10-15 mg, 10-15 mg, Oral, Q4H PRN, Kathryne Hitch, MD, 15 mg at 05/22/23 0121   oxyCODONE (Oxy IR/ROXICODONE) immediate release tablet 5-10 mg, 5-10 mg, Oral, Q4H PRN, Kathryne Hitch, MD, 10 mg at 05/23/23 0803   polyethylene glycol (MIRALAX / GLYCOLAX) packet 17 g, 17 g, Oral, Daily PRN, Kathryne Hitch, MD   senna [facility]) tablet 8.6 mg, 1 tablet, Oral, Daily, Kathryne Hitch, MD, 8.6 mg at 05/23/23 0803   tamsulosin (FLOMAX) capsule 0.4 mg, 0.4 mg, Oral, Q48H, Kathryne Hitch, MD, 0.4 mg at 05/21/23 2051     Patients Current Diet:  Diet Order                  Diet - low sodium heart healthy             Diet regular Room service appropriate? Yes; Fluid consistency: Thin  Diet effective now                         Precautions / Restrictions Precautions Precautions: Fall Restrictions Weight Bearing Restrictions Per Provider Order: Yes RLE Weight Bearing Per Provider Order: Weight bearing as tolerated    Has the patient had 2 or more  falls or a fall with injury in the past year? Yes   Prior Activity Level Household: self limited mobility 2/2 severe depression since Oct of last year.  Indep without DME in household setting, assist PRN 2/2 mental health, doesn't drive   Prior Functional Level  Self Care: Did the patient need help bathing, dressing, using the toilet or eating? Needed some help   Indoor Mobility: Did the patient need assistance with walking from room to room (with or without device)? Needed some help   Stairs: Did the patient need assistance with internal or external stairs (with or without device)? Needed some help   Functional Cognition: Did the patient need help planning regular tasks such as shopping or remembering to take medications? Needed some help   Patient Information Are you of Hispanic, Latino/a,or Spanish origin?: A. No, not of Hispanic, Latino/a, or Spanish origin What is your race?: A. White Do you need or want an interpreter to communicate with a doctor or health care staff?: 0. No   Patient's Response To:  Health Literacy and Transportation Is the patient able to respond to health literacy and transportation needs?: Yes Health Literacy - How often do you need to have someone help you when you read instructions, pamphlets, or other written material from your doctor or pharmacy?: Never In the past 12 months, has lack of transportation kept you from medical appointments or from getting medications?: No In the past 12 months, has lack of transportation kept you from meetings, work, or from getting things needed for daily living?: No   Home Assistive Devices / Equipment Home Equipment: Rollator (4 wheels)   Prior Device Use: Indicate devices/aids used by the patient prior to current illness, exacerbation or injury? None of the above   Current Functional Level Cognition   Overall Cognitive Status: Within Functional Limits for tasks assessed Orientation Level: Oriented X4 General Comments:  Pt with sometimes slow processing, mild impulsivity and requires cueing for safety (attempting to get up before therapist is ready, or without RW) Anticipate this is baseline. Pt responds well to direct questions about his social history and hobbies, just appears reserved.    Extremity Assessment (includes Sensation/Coordination)   Upper Extremity Assessment: RUE deficits/detail, LUE deficits/detail RUE Deficits / Details: grossly 3/5 MMT with arthritic changes in fingers, decreased grip strength.  tremulous with movement. RUE Coordination: decreased fine motor LUE Deficits / Details: grossly 3/5 MMT with arthritic changes in fingers, decreased grip strength. tremulous with movement. LUE Coordination: decreased fine motor  Lower Extremity Assessment: Defer to PT evaluation RLE Deficits / Details: femoral fx s/p R THA; able to perform full range LAQ, gross hip strength < 3/5 with expected post-op pain and weakness, though able to WB     ADLs   Overall ADL's : Needs assistance/impaired Grooming: Sitting, Set up Upper Body Dressing : Minimal assistance, Sitting Lower Body Dressing: Total assistance, Sit to/from stand Lower Body Dressing Details (indicate cue type and reason): requires assist for socks, min assist to stand and relies on BUE support Toilet Transfer: Minimal assistance Toilet Transfer Details (indicate cue type and reason): step pivot to recliner using RW Functional mobility during ADLs: Minimal assistance, Rolling walker (2 wheels), Cueing for sequencing, Cueing for safety     Mobility   Overal bed mobility: Needs Assistance Bed Mobility: Supine to Sit, Sit to Supine Supine to sit: HOB elevated, Contact guard Sit to supine: Min assist, Used rails General bed mobility comments: min assist for R LE mgmt and increased time, pt able to use gait belt as leg lifter strap to sit up with slightly less assist needed than in previous session.     Transfers   Overall transfer level:  Needs assistance Equipment used: Rolling walker (2 wheels) Transfers: Sit to/from Stand, Bed  to chair/wheelchair/BSC Sit to Stand: Min assist Bed to/from chair/wheelchair/BSC transfer type:: Step pivot Step pivot transfers: Min assist General transfer comment: min assist to power up fully and steady, stepping towards L to recliner with min assist     Ambulation / Gait / Stairs / Wheelchair Mobility   Ambulation/Gait Ambulation/Gait assistance: Editor, commissioning (Feet): 60 Feet (53ft, seated break, 74ft, seated break, 58ft) Assistive device: Rolling walker (2 wheels) Gait Pattern/deviations: Step-through pattern, Decreased stride length, Trunk flexed, Decreased dorsiflexion - right, Decreased weight shift to right, Decreased stance time - right General Gait Details: mildly antalgic gait pattern but good effort and improved standing tolerance this date. Initial drop in BP from sit>stand but improved after standing activity/gait trial. Pt with excellent participation, needs cues for safe RW management and activity pacing.     Posture / Balance Balance Overall balance assessment: Needs assistance Sitting-balance support: No upper extremity supported, Feet supported Sitting balance-Leahy Scale: Fair Standing balance support: Bilateral upper extremity supported, During functional activity Standing balance-Leahy Scale: Poor Standing balance comment: relies on RW     Special needs/care consideration Skin surgical incision to R hip    Previous Home Environment (from acute therapy documentation) Living Arrangements: Spouse/significant other Available Help at Discharge: Family, Available 24 hours/day Type of Home: House Home Layout: Able to live on main level with bedroom/bathroom Home Access: Stairs to enter Entrance Stairs-Rails: Right Entrance Stairs-Number of Steps: 2-3 Bathroom Shower/Tub: Engineer, manufacturing systems: Standard Home Care Services: No   Discharge Living  Setting Plans for Discharge Living Setting: Patient's home, Lives with (comment) (spouse) Type of Home at Discharge: House Discharge Home Layout: Able to live on main level with bedroom/bathroom Discharge Home Access: Stairs to enter Entrance Stairs-Rails: Right Entrance Stairs-Number of Steps: 2-3 Discharge Bathroom Shower/Tub: Tub/shower unit Discharge Bathroom Toilet: Standard Discharge Bathroom Accessibility: Yes How Accessible: Accessible via walker Does the patient have any problems obtaining your medications?: No   Social/Family/Support Systems Patient Roles: Spouse Anticipated Caregiver: spouse, Jeremy [name] Anticipated Industrial/product designer Information: [phone] Ability/Limitations of Caregiver: supervision to min assist Caregiver Availability: 24/7 Discharge Plan Discussed with Primary Caregiver: Yes Is Caregiver In Agreement with Plan?: Yes Does Caregiver/Family have Issues with Lodging/Transportation while Pt is in Rehab?: No   Goals Patient/Family Goal for Rehab: PT/OT supervision to mod I, SLP n/a Expected length of stay: 14-18 days Additional Information: Discharge plan: discharge back to pt's house, wife was providing assist at baseline 2/2 pt's depression Pt/Family Agrees to Admission and willing to participate: Yes Program Orientation Provided & Reviewed with Pt/Caregiver Including Roles  & Responsibilities: Yes Additional Information Needs: Pt would benefit from neuropsych while on CIR   Decrease burden of Care through IP rehab admission: n/a   Possible need for SNF placement upon discharge:  Not anticipated.  Plan for discharge to pt's home with spouse providing up to min assist, 24/7   Patient Condition: I have reviewed medical records from [facility], spoken with CM, and patient and spouse. I met with patient at the bedside for inpatient rehabilitation assessment.  Patient will benefit from ongoing PT and OT, can actively participate in 3 hours of therapy a day 5  days of the week, and can make measurable gains during the admission.  Patient will also benefit from the coordinated team approach during an Inpatient Acute Rehabilitation admission.  The patient will receive intensive therapy as well as Rehabilitation physician, nursing, social worker, and care management interventions.  Due to safety, skin/wound care, disease management,  medication administration, pain management, and patient education the patient requires 24 hour a day rehabilitation nursing.  The patient is currently min assist with mobility and basic ADLs.  Discharge setting and therapy post discharge at  home  is anticipated.  Patient has agreed to participate in the Acute Inpatient Rehabilitation Program and will admit today.   Preadmission Screen Completed By:  Stephania Fragmin, PT, DPT  05/23/2023 10:49 AM ______________________________________________________________________   Discussed status with Dr. Berline Chough on 05/23/23  at 10:49 AM  and received approval for admission today.   Admission Coordinator:  Stephania Fragmin, PT, DPT time 10:49 AM Dorna Bloom 05/23/23     Assessment/Plan: Diagnosis: R hip fracture Does the need for close, 24 hr/day Medical supervision in concert with the patient's rehab needs make it unreasonable for this patient to be served in a less intensive setting? Yes Co-Morbidities requiring supervision/potential complications: ABLA; severe depression- very little responiveness- hyponatremia; Iron def anemia; leukocytosis- rising Due to bladder management, bowel management, safety, skin/wound care, disease management, medication administration, pain management, and patient education, does the patient require 24 hr/day rehab nursing? Yes Does the patient require coordinated care of a physician, rehab nurse, PT, OT, and SLP to address physical and functional deficits in the context of the above medical diagnosis(es)? Yes Addressing deficits in the following areas: balance,  endurance, locomotion, strength, transferring, bowel/bladder control, bathing, dressing, feeding, grooming, and toileting Can the patient actively participate in an intensive therapy program of at least 3 hrs of therapy 5 days a week? Yes The potential for patient to make measurable gains while on inpatient rehab is good Anticipated functional outcomes upon discharge from inpatient rehab: modified independent and supervision PT, modified independent and supervision OT, n/a SLP Estimated rehab length of stay to reach the above functional goals is: 14-18 days Anticipated discharge destination: Home 10. Overall Rehab/Functional Prognosis: good     MD Signature:            Revision History

## 2023-05-24 ENCOUNTER — Other Ambulatory Visit: Payer: Self-pay

## 2023-05-24 ENCOUNTER — Encounter (HOSPITAL_COMMUNITY): Payer: Self-pay | Admitting: Physical Medicine and Rehabilitation

## 2023-05-24 ENCOUNTER — Inpatient Hospital Stay (HOSPITAL_COMMUNITY): Payer: Medicare Other

## 2023-05-24 DIAGNOSIS — R609 Edema, unspecified: Secondary | ICD-10-CM

## 2023-05-24 DIAGNOSIS — S72001A Fracture of unspecified part of neck of right femur, initial encounter for closed fracture: Secondary | ICD-10-CM

## 2023-05-24 LAB — COMPREHENSIVE METABOLIC PANEL
ALT: 12 U/L (ref 0–44)
AST: 23 U/L (ref 15–41)
Albumin: 2.5 g/dL — ABNORMAL LOW (ref 3.5–5.0)
Alkaline Phosphatase: 48 U/L (ref 38–126)
Anion gap: 9 (ref 5–15)
BUN: 10 mg/dL (ref 8–23)
CO2: 24 mmol/L (ref 22–32)
Calcium: 8.4 mg/dL — ABNORMAL LOW (ref 8.9–10.3)
Chloride: 101 mmol/L (ref 98–111)
Creatinine, Ser: 0.71 mg/dL (ref 0.61–1.24)
GFR, Estimated: >60 mL/min (ref >60)
Glucose, Bld: 104 mg/dL — ABNORMAL HIGH (ref 70–99)
Potassium: 3.7 mmol/L (ref 3.5–5.1)
Sodium: 134 mmol/L — ABNORMAL LOW (ref 135–145)
Total Bilirubin: 0.7 mg/dL (ref <1.2)
Total Protein: 5.6 g/dL — ABNORMAL LOW (ref 6.5–8.1)

## 2023-05-24 LAB — CBC WITH DIFFERENTIAL/PLATELET
Abs Immature Granulocytes: 0.04 10*3/uL (ref 0.00–0.07)
Basophils Absolute: 0 10*3/uL (ref 0.0–0.1)
Basophils Relative: 0 %
Eosinophils Absolute: 0.5 10*3/uL (ref 0.0–0.5)
Eosinophils Relative: 6 %
HCT: 27.2 % — ABNORMAL LOW (ref 39.0–52.0)
Hemoglobin: 9.3 g/dL — ABNORMAL LOW (ref 13.0–17.0)
Immature Granulocytes: 1 %
Lymphocytes Relative: 17 %
Lymphs Abs: 1.4 10*3/uL (ref 0.7–4.0)
MCH: 31.2 pg (ref 26.0–34.0)
MCHC: 34.2 g/dL (ref 30.0–36.0)
MCV: 91.3 fL (ref 80.0–100.0)
Monocytes Absolute: 0.7 10*3/uL (ref 0.1–1.0)
Monocytes Relative: 9 %
Neutro Abs: 5.7 10*3/uL (ref 1.7–7.7)
Neutrophils Relative %: 67 %
Platelets: 181 10*3/uL (ref 150–400)
RBC: 2.98 MIL/uL — ABNORMAL LOW (ref 4.22–5.81)
RDW: 12.2 % (ref 11.5–15.5)
WBC: 8.4 10*3/uL (ref 4.0–10.5)
nRBC: 0 % (ref 0.0–0.2)

## 2023-05-24 MED ORDER — POLYETHYLENE GLYCOL 3350 17 G PO PACK
17.0000 g | PACK | ORAL | Status: DC
  Administered 2023-05-24: 17 g via ORAL
  Filled 2023-05-24: qty 1

## 2023-05-24 NOTE — Evaluation (Signed)
Physical Therapy Assessment and Plan  Patient Details  Name: Jeremy [name] MRN: [id number] Date of Birth: [birth year]  PT Diagnosis: Abnormality of gait, Difficulty walking, Low back pain, and Pain in hip Rehab Potential: Good ELOS: 10-12 days   Today's Date: 05/24/2023 PT Individual Time: 0907-1005 PT Individual Time Calculation (min): 58 min    Hospital Problem: Principal Problem:   Closed right hip fracture, initial encounter [facility])   Past Medical History:  Past Medical History:  Diagnosis Date   Aortic atherosclerosis (HCC)    BPH (benign prostatic hyperplasia)    Bronchiectasis (HCC)    Depression    Depression    GAD (generalized anxiety disorder)    Past Surgical History:  Past Surgical History:  Procedure Laterality Date   APPENDECTOMY     HERNIA REPAIR     TOTAL HIP ARTHROPLASTY Right 05/21/2023   Procedure: RIGHT TOTAL HIP ARTHROPLASTY;  Surgeon: Kathryne Hitch, MD;  Location: [facility] OR;  Service: Orthopedics;  Laterality: Right;    Assessment & Plan Clinical Impression: Jeremy [name] is an 87 year old L handed male who sustained a mechanical fall at home on 05/20/2023. Imaging significant for displaced right femoral neck fracture. No other injuries. Orthopedic surgery consulted and admited to [facility] service. Past medical history significant for BPH, bronchiectasis, severe depression, anxiety and aortic atherosclerosis. Underwent right hip arthroplasty by Dr. Magnus Ivan on 12/17. Labs stable. Serum iron 10. EKG: first-degree AV block. WBAT. Tolerating diet. The patient requires inpatient medicine and rehabilitation evaluations and services for ongoing dysfunction secondary to right hip fracture.  Patient transferred to CIR on 05/23/2023 .   Patient currently requires min with mobility secondary to muscle weakness and muscle joint tightness and decreased balance strategies and difficulty maintaining precautions.  Prior to hospitalization, patient was modified independent  with  mobility and lived with Spouse in a House home.  Home access is 2 in the back with post (for roof) they use as hand rail on R, 3 in front with bilateral hand rails but unable to reach bothStairs to enter.  Patient will benefit from skilled PT intervention to maximize safe functional mobility, minimize fall risk, and decrease caregiver burden for planned discharge home with 24 hour supervision.  Anticipate patient will benefit from follow up OP at discharge.  PT - End of Session Activity Tolerance: Tolerates 10 - 20 min activity with multiple rests Endurance Deficit: Yes Endurance Deficit Description: breaks needed during ADLs PT Assessment Rehab Potential (ACUTE/IP ONLY): Good PT Barriers to Discharge: Inaccessible home environment;Home environment access/layout;Wound Care;Weight bearing restrictions PT Patient demonstrates impairments in the following area(s): Pain;Safety;Endurance;Sensory;Motor PT Transfers Functional Problem(s): Bed Mobility;Bed to Chair;Car PT Locomotion Functional Problem(s): Ambulation;Wheelchair Mobility PT Plan PT Intensity: Minimum of 1-2 x/day ,45 to 90 minutes PT Frequency: 5 out of 7 days PT Duration Estimated Length of Stay: 10-12 days PT Treatment/Interventions: Ambulation/gait training;Cognitive remediation/compensation;Discharge planning;DME/adaptive equipment instruction;Functional mobility training;Pain management;Psychosocial support;Splinting/orthotics;Therapeutic Activities;UE/LE Strength taining/ROM;Wheelchair propulsion/positioning;UE/LE Coordination activities;Therapeutic Exercise;Stair training;Skin care/wound management;Neuromuscular re-education;Patient/family education;Disease management/prevention;Community reintegration;Balance/vestibular training PT Transfers Anticipated Outcome(s): mod I with LRAD PT Locomotion Anticipated Outcome(s): supervision gait, CGA stairs PT Recommendation Recommendations for Other Services: Therapeutic Recreation  consult Therapeutic Recreation Interventions: Pet therapy;Stress management Follow Up Recommendations: None Patient destination: Home Equipment Recommended: Rolling walker with 5" wheels   PT Evaluation Precautions/Restrictions Precautions Precautions: Fall Restrictions Weight Bearing Restrictions Per Provider Order: Yes RLE Weight Bearing Per Provider Order: Weight bearing as tolerated General   Vital Signs  Pain   Pain Interference Pain Interference  Pain Effect on Sleep: 1. Rarely or not at all Pain Interference with Therapy Activities: 2. Occasionally Pain Interference with Day-to-Day Activities: 2. Occasionally Home Living/Prior Functioning Home Living Available Help at Discharge: Family;Available 24 hours/day Type of Home: House Home Access: Stairs to enter [employer] of Steps: 2 in the back with post (for roof) they use as hand rail on R, 3 in front with bilateral hand rails but unable to reach both Entrance Stairs-Rails: Right;Left Home Layout: Able to live on main level with bedroom/bathroom Bathroom Shower/Tub: Walk-in shower;Tub/shower unit (tub/shower has curtain, walk in shower with glass door) Bathroom Toilet: Handicapped height Bathroom Accessibility: Yes Additional Comments: Has RW, rollator  Lives With: Spouse Prior Function Level of Independence: Independent with gait;Independent with transfers;Needs assistance with ADLs Bath: Minimal (could shower with supervision but had anxiety therefore wife was assisting with sponge baths) Grooming: Supervision/set-up  Able to Take Stairs?: Yes Driving: No Vocation: Retired Vision/Perception  Vision - History Ability to See in Adequate Light: 0 Adequate Perception Perception: Within Functional Limits Praxis Praxis: WFL  Cognition Overall Cognitive Status: Within Functional Limits for tasks assessed Arousal/Alertness: Awake/alert Orientation Level: Oriented X4 Month: December Memory: Appears  intact Awareness: Appears intact Problem Solving: Appears intact Safety/Judgment: Impaired Sensation Sensation Light Touch: Appears Intact Hot/Cold: Appears Intact Proprioception: Appears Intact Stereognosis: Not tested Additional Comments: Pt reports numbness at Rt knee region, however sensation WFL with formal testing Coordination Gross Motor Movements are Fluid and Coordinated: No Fine Motor Movements are Fluid and Coordinated: No Coordination and Movement Description: limited by RLE pain Finger Nose Finger Test: slow bilaterally, mild dysmetria Motor  Motor Motor: Within Functional Limits Motor - Skilled Clinical Observations: delayed initiation at times, but largely [facility] and limited by hip pain   Trunk/Postural Assessment  Cervical Assessment Cervical Assessment: Within Functional Limits Thoracic Assessment Thoracic Assessment: Within Functional Limits Lumbar Assessment Lumbar Assessment: Within Functional Limits Postural Control Postural Control: Within Functional Limits  Balance Balance Balance Assessed: Yes Static Sitting Balance Static Sitting - Balance Support: Feet supported Static Sitting - Level of Assistance: 5: Stand by assistance Dynamic Sitting Balance Dynamic Sitting - Balance Support: During functional activity Dynamic Sitting - Level of Assistance: 5: Stand by assistance Static Standing Balance Static Standing - Balance Support: Bilateral upper extremity supported Static Standing - Level of Assistance: 5: Stand by assistance Dynamic Standing Balance Dynamic Standing - Balance Support: During functional activity;Bilateral upper extremity supported Dynamic Standing - Level of Assistance: 4: Min assist Extremity Assessment  RUE Assessment RUE Assessment: Exceptions to [facility] Active Range of Motion (AROM) Comments: Shoulders > wrist WFL, 1-3 digits with swan neck deformity General Strength Comments: 3+/5 grossly including grasp LUE Assessment LUE  Assessment: Exceptions to [facility] Active Range of Motion (AROM) Comments: Shoulders > wrist WFL, 3-5 digits with swan neck deformity General Strength Comments: 3-/5 grossly including grasp RLE Assessment RLE Assessment: Exceptions to [facility] General Strength Comments: RLE hip flexion and knee extension limited by pain, 3/5, knee flexion and ankle 5/5 LLE Assessment LLE Assessment: Within Functional Limits General Strength Comments: gorssly 5/5  Care Tool Care Tool Bed Mobility Roll left and right activity   Roll left and right assist level: Contact Guard/Touching assist    Sit to lying activity   Sit to lying assist level: Contact Guard/Touching assist    Lying to sitting on side of bed activity   Lying to sitting on side of bed assist level: the ability to move from lying on the back to sitting on  the side of the bed with no back support.: Contact Guard/Touching assist     Care Tool Transfers Sit to stand transfer   Sit to stand assist level: Contact Guard/Touching assist    Chair/bed transfer   Chair/bed transfer assist level: Contact Guard/Touching assist    Car transfer Car transfer activity did not occur: Safety/medical concerns        Care Tool Locomotion Ambulation   Assist level: Contact Guard/Touching assist Assistive device: Walker-rolling Max distance: 20  Walk 10 feet activity   Assist level: Contact Guard/Touching assist Assistive device: Walker-rolling   Walk 50 feet with 2 turns activity Walk 50 feet with 2 turns activity did not occur: Safety/medical concerns      Walk 150 feet activity Walk 150 feet activity did not occur: Safety/medical concerns      Walk 10 feet on uneven surfaces activity Walk 10 feet on uneven surfaces activity did not occur: Safety/medical concerns      Stairs Stair activity did not occur: Safety/medical concerns        Walk up/down 1 step activity Walk up/down 1 step or curb (drop down) activity did not occur: Safety/medical  concerns      Walk up/down 4 steps activity Walk up/down 4 steps activity did not occur: Safety/medical concerns      Walk up/down 12 steps activity Walk up/down 12 steps activity did not occur: Safety/medical concerns      Pick up small objects from floor Pick up small object from the floor (from standing position) activity did not occur: Safety/medical concerns      Wheelchair Is the patient using a wheelchair?: Yes Type of Wheelchair: Manual   Wheelchair assist level: Supervision/Verbal cueing Max wheelchair distance: 10  Wheel 50 feet with 2 turns activity   Assist Level: Dependent - Patient 0%  Wheel 150 feet activity   Assist Level: Dependent - Patient 0%    Refer to Care Plan for Long Term Goals  SHORT TERM GOAL WEEK 1 PT Short Term Goal 1 (Week 1): pt will navigate stairs x 2 with CGA PT Short Term Goal 2 (Week 1): Pt will ambulate x 100 ft with LRAD  Recommendations for other services: Therapeutic Recreation  Pet therapy and Stress management  Skilled Therapeutic Intervention Evaluation completed (see details above) with patient education regarding purpose of PT evaluation, PT POC and goals, therapy schedule, weekly team meetings, and other CIR information including safety plan and fall risk safety. Pt reports 7/10 pain in his hip, premedicated. Rest and positioning provided as needed.  Pt performed the below functional mobility tasks with the specified levels of skilled cuing and assistance. Pt remained sitting in w/c with direct hand off to OT.  Mobility Bed Mobility Bed Mobility: Supine to Sit Supine to Sit: Contact Guard/Touching assist Transfers Transfers: Sit to Stand;Stand to Sit;Stand Pivot Transfers Sit to Stand: Contact Guard/Touching assist Stand to Sit: Contact Guard/Touching assist Stand Pivot Transfers: Contact Guard/Touching assist Transfer (Assistive device): Rolling walker Locomotion  Gait Ambulation: Yes Gait Assistance: Contact Guard/Touching  assist Gait Distance (Feet): 20 Feet Assistive device: Rolling walker Gait Gait: Yes Gait Pattern: Impaired Gait Pattern: Antalgic Gait velocity: decr Stairs / Additional Locomotion Stairs: No Wheelchair Mobility Wheelchair Mobility: Yes Wheelchair Assistance: Doctor, general practice: Both upper extremities Wheelchair Parts Management: Needs assistance Distance: 5ft   Discharge Criteria: Patient will be discharged from PT if patient refuses treatment 3 consecutive times without medical reason, if treatment goals not met, if there is a  change in medical status, if patient makes no progress towards goals or if patient is discharged from hospital.  The above assessment, treatment plan, treatment alternatives and goals were discussed and mutually agreed upon: by patient  Juluis Rainier 05/24/2023, 12:58 PM

## 2023-05-24 NOTE — Progress Notes (Signed)
Lower extremity venous duplex completed. Please see CV Procedures for preliminary results.  Shona Simpson, RVT 05/24/23 4:28 PM

## 2023-05-24 NOTE — Progress Notes (Signed)
Inpatient Rehabilitation Admission Medication Review by a Pharmacist  A complete drug regimen review was completed for this patient to identify any potential clinically significant medication issues.  High Risk Drug Classes Is patient taking? Indication by Medication  Antipsychotic Yes Aripiprazole - mood  Anticoagulant Yes Lovenox - VTE ppx  Antibiotic No   Opioid Yes Oxycodone - prn pain  Antiplatelet No   Hypoglycemics/insulin No   Vasoactive Medication No   Chemotherapy No   Other Yes Duloxetine, Mirtazapine - MDD Methocarbamol prn spasms  Finasteride, Tamsulosin - BPH     Type of Medication Issue Identified Description of Issue Recommendation(s)  Drug Interaction(s) (clinically significant)     Duplicate Therapy     Allergy     No Medication Administration End Date     Incorrect Dose     Additional Drug Therapy Needed     Significant med changes from prior encounter (inform family/care partners about these prior to discharge).    Other       Clinically significant medication issues were identified that warrant physician communication and completion of prescribed/recommended actions by midnight of the next day:  No  Pharmacist comments: None  Time spent performing this drug regimen review (minutes):  20 minutes  Thank you Okey Regal, PharmD

## 2023-05-24 NOTE — Progress Notes (Signed)
Inpatient Rehabilitation  Patient information reviewed and entered into eRehab system by Demarrio Menges Treg Diemer, OTR/L, Rehab Quality Coordinator.   Information including medical coding, functional ability and quality indicators will be reviewed and updated through discharge.   

## 2023-05-24 NOTE — Discharge Summary (Incomplete)
Physician Discharge Summary  Patient ID: Jeremy [name] MRN: [id number] DOB/AGE: Sep 01, 1935 87 y.o.  Admit date: 05/23/2023 Discharge date: TBD  Discharge Diagnoses:  Principal Problem:   Closed right hip fracture, initial encounter Jeremy [name]) Active problems: Functional deficits secondary to right hip fracture Benign prostatic hypertrophy Chronic back pain Bronchiectasis Severe depression Anxiety Aortic atherosclerosis Acute blood loss anemia Iron deficiency Hyponatremia Leukocytosis   Discharged Condition: {condition:18240}  Significant Diagnostic Studies:   Labs:  Basic Metabolic Panel: Recent Labs  Lab 05/20/23 1811 05/21/23 0350 05/23/23 0557 05/24/23 0624  NA 138 136 132* 134*  K 4.1 3.9 3.7 3.7  CL 102 106 100 101  CO2 26 23 24 24   GLUCOSE 106* 131* 117* 104*  BUN 15 14 13 10   CREATININE 0.74 0.79 0.94 0.71  CALCIUM 9.1 8.6* 8.3* 8.4*    CBC: Recent Labs  Lab 05/20/23 1811 05/21/23 0350 05/22/23 0643 05/23/23 0557 05/24/23 0624  WBC 6.9   < > 11.3* 12.1* 8.4  NEUTROABS 4.8  --   --   --  5.7  HGB 11.9*   < > 9.6* 9.1* 9.3*  HCT 35.6*   < > 28.3* 26.8* 27.2*  MCV 92.7   < > 92.5 91.5 91.3  PLT 232   < > 170 155 181   < > = values in this interval not displayed.     Brief HPI:   Jeremy [name] is a 87 y.o. male who sustained a mechanical fall at home on 05/20/2023. Imaging significant for displaced right femoral neck fracture. No other injuries. Orthopedic surgery consulted and admited to [place] [name] service. Past medical history significant for BPH, bronchiectasis,  severe depression, anxiety and aortic atherosclerosis. Underwent right hip arthroplasty by Dr. Magnus Ivan on 12/17. Labs stable. Serum iron 10. EKG: first-degree AV block. WBAT. Tolerating diet.    [name] Course: Jeremy [name] was admitted to rehab 05/23/2023 for inpatient therapies to consist of PT, ST and OT at least three hours five days a week. Past admission physiatrist, therapy team and rehab RN  have worked together to provide customized collaborative inpatient rehab.  Follow-up labs on 12/20 revealed improvement in serum sodium to 134, leukocytosis resolved and hemoglobin stable.   Blood pressures were monitored on TID basis and was hypotensive overnight 12/20.  Rehab course: During patient's stay in rehab weekly team conferences were held to monitor patient's progress, set goals and discuss barriers to discharge. At admission, patient required  He  has had improvement in activity tolerance, balance, postural control as well as ability to compensate for deficits. He has had improvement in functional use RUE/LUE  and RLE/LLE as well as improvement in awareness       Disposition: Discharge disposition: 01-Home or Self Care        Diet:  Special Instructions:   Allergies as of 05/24/2023       Reactions   Aspergillus Allergy Skin Test Cough   Aspirin    Irritates stomach   Sulfa Antibiotics Other (See Comments)   Childhood    Theophyllines Other (See Comments)   Does not remember     Med Rec must be completed prior to using this SMARTLINK***        Signed: Milinda Antis 05/24/2023, 12:27 PM

## 2023-05-24 NOTE — Progress Notes (Signed)
Physical Therapy Session Note  Patient Details  Name: Jeremy [name] MRN: [id number] Date of Birth: [birth year]  Today's Date: 05/24/2023 PT Individual Time: 1340-1448 PT Individual Time Calculation (min): 68 min   Short Term Goals: Week 1:  PT Short Term Goal 1 (Week 1): pt will navigate stairs x 2 with CGA PT Short Term Goal 2 (Week 1): Pt will ambulate x 100 ft with LRAD  Skilled Therapeutic Interventions/Progress Updates: Pt presented in bed agreeable to therapy, pt states pain 7/10, indicated premedicated however this therapist noted during session PRN meds avail and nsg notified. Pt completed bed mobility with CGA and use of bed features. Completed ambulatory transfer to w/c with RW and CGA. Pt transported to ortho gym for time management and energy conservation. Pt completed car transfer to simulator with CGA and increased time. Pt used hands to assist RLE into simulator. Pt then transferred out of car in same manner and returned to w/c. Pt transported to main gym and ascended/descended x 4 steps with B rails and CGA. Pt then moved over to parallel bars and participated in seated and standing LE therex as follows: standing hip abd/add, hamstring curls AROM 2 x 10, in sitting LAQ AROM 2 x 10 bilaterally. Pt then taken out of parallel bars and worked on ambulation. Pt ambulated 59ft with RW and CGA then after seated rest (and receiving pain meds) ambulated an additional 134ft back to room. Pt noted to ambulate with narrow BOS, mild forward flexed posture, uneven step length, and decreased self selected gait speed. Pt agreeable to sit in w/c after session. Pt left in w/c with belt alarm on, call bell within reach and needs met.    Therapy Documentation Precautions:  Precautions Precautions: Fall Restrictions Weight Bearing Restrictions Per Provider Order: Yes RLE Weight Bearing Per Provider Order: Weight bearing as tolerated General:   Vital Signs: Therapy Vitals Temp: 98 F (36.7  C) Pulse Rate: 80 Resp: 18 BP: 107/61 Patient Position (if appropriate): Sitting Oxygen Therapy O2 Device: Room Air Pain: Pain Assessment Pain Scale: 0-10 Pain Score: 7  Pain Location: Hip Pain Orientation: Right Pain Intervention(s): Medication (See eMAR)     Therapy/Group: Individual Therapy  Jeremy [name] 05/24/2023, 2:56 PM

## 2023-05-24 NOTE — Progress Notes (Signed)
Patient ID: Jeremy [name], male   DOB: 07/02/1935, 87 y.o.   MRN: [id number] Met with the patient and wife to review current medical situation (post right femoral neck fracture after a fall) with hx. Of decreased vocal intensity and delayed responses but able to carry conversation, flat affect. Reported trouble swallowing before the fall but not consistently an issue. Constipation hx.; taking Miralax three times a week PTA.  Voiding with Proscar and Flomax on board. Pain managed. Wife expressed concern about administering Lovenox; reassure po Asa twice a day at discharge per MD.  Continue to follow along to address educational needs to facilitate preparation for discharge. Has MyChart access/PCP. Pamelia Hoit

## 2023-05-24 NOTE — Plan of Care (Signed)
  Problem: RH Balance Goal: LTG Patient will maintain dynamic standing with ADLs (OT) Description: LTG:  Patient will maintain dynamic standing balance with assist during activities of daily living (OT)  Flowsheets (Taken 05/24/2023 1222) LTG: Pt will maintain dynamic standing balance during ADLs with: Independent with assistive device   Problem: Sit to Stand Goal: LTG:  Patient will perform sit to stand in prep for activites of daily living with assistance level (OT) Description: LTG:  Patient will perform sit to stand in prep for activites of daily living with assistance level (OT) Flowsheets (Taken 05/24/2023 1222) LTG: PT will perform sit to stand in prep for activites of daily living with assistance level: Independent with assistive device   Problem: RH Eating Goal: LTG Patient will perform eating w/assist, cues/equip (OT) Description: LTG: Patient will perform eating with assist, with/without cues using equipment (OT) Flowsheets (Taken 05/24/2023 1222) LTG: Pt will perform eating with assistance level of: Set up assist    Problem: RH Grooming Goal: LTG Patient will perform grooming w/assist,cues/equip (OT) Description: LTG: Patient will perform grooming with assist, with/without cues using equipment (OT) Flowsheets (Taken 05/24/2023 1222) LTG: Pt will perform grooming with assistance level of: Set up assist    Problem: RH Bathing Goal: LTG Patient will bathe all body parts with assist levels (OT) Description: LTG: Patient will bathe all body parts with assist levels (OT) Flowsheets (Taken 05/24/2023 1222) LTG: Pt will perform bathing with assistance level/cueing: Supervision/Verbal cueing   Problem: RH Dressing Goal: LTG Patient will perform upper body dressing (OT) Description: LTG Patient will perform upper body dressing with assist, with/without cues (OT). Flowsheets (Taken 05/24/2023 1222) LTG: Pt will perform upper body dressing with assistance level of: Independent with  assistive device Goal: LTG Patient will perform lower body dressing w/assist (OT) Description: LTG: Patient will perform lower body dressing with assist, with/without cues in positioning using equipment (OT) Flowsheets (Taken 05/24/2023 1222) LTG: Pt will perform lower body dressing with assistance level of: Independent with assistive device   Problem: RH Toileting Goal: LTG Patient will perform toileting task (3/3 steps) with assistance level (OT) Description: LTG: Patient will perform toileting task (3/3 steps) with assistance level (OT)  Flowsheets (Taken 05/24/2023 1222) LTG: Pt will perform toileting task (3/3 steps) with assistance level: Independent with assistive device   Problem: RH Toilet Transfers Goal: LTG Patient will perform toilet transfers w/assist (OT) Description: LTG: Patient will perform toilet transfers with assist, with/without cues using equipment (OT) Flowsheets (Taken 05/24/2023 1222) LTG: Pt will perform toilet transfers with assistance level of: Independent with assistive device   Problem: RH Tub/Shower Transfers Goal: LTG Patient will perform tub/shower transfers w/assist (OT) Description: LTG: Patient will perform tub/shower transfers with assist, with/without cues using equipment (OT) Flowsheets (Taken 05/24/2023 1222) LTG: Pt will perform tub/shower stall transfers with assistance level of: Supervision/Verbal cueing

## 2023-05-24 NOTE — Evaluation (Signed)
Occupational Therapy Assessment and Plan  Patient Details  Name: Jeremy [name] MRN: [id number] Date of Birth: 17-Aug-1935  OT Diagnosis: acute pain, cognitive deficits, muscle weakness (generalized), and pain in joint Rehab Potential: Rehab Potential (ACUTE ONLY): Good ELOS: 10-12 days   Today's Date: 05/24/2023 OT Individual Time: 9147-8295 OT Individual Time Calculation (min): 70 min     Hospital Problem: Principal Problem:   Closed right hip fracture, initial encounter [facility])   Past Medical History:  Past Medical History:  Diagnosis Date   Aortic atherosclerosis (HCC)    BPH (benign prostatic hyperplasia)    Bronchiectasis (HCC)    Depression    Depression    GAD (generalized anxiety disorder)    Past Surgical History:  Past Surgical History:  Procedure Laterality Date   APPENDECTOMY     HERNIA REPAIR     TOTAL HIP ARTHROPLASTY Right 05/21/2023   Procedure: RIGHT TOTAL HIP ARTHROPLASTY;  Surgeon: Kathryne Hitch, MD;  Location: [facility] OR;  Service: Orthopedics;  Laterality: Right;    Assessment & Plan Clinical Impression: Jeremy [name] is an 87 year old L handed male who sustained a mechanical fall at home on 05/20/2023. Imaging significant for displaced right femoral neck fracture. No other injuries. Orthopedic surgery consulted and admited to [facility] service. Past medical history significant for BPH, bronchiectasis, severe depression, anxiety and aortic atherosclerosis. Underwent right hip arthroplasty by Dr. Magnus Ivan on 12/17. Labs stable. Serum iron 10. EKG: first-degree AV block. WBAT. Tolerating diet. The patient requires inpatient medicine and rehabilitation evaluations and services for ongoing dysfunction secondary to right hip fracture. Patient transferred to CIR on 05/23/2023 .    Patient currently requires mod with basic self-care skills secondary to muscle weakness, decreased cardiorespiratoy endurance, decreased coordination, decreased initiation, decreased problem  solving, and delayed processing, and decreased standing balance.  Prior to hospitalization, patient could complete ADLs with min A.  Patient will benefit from skilled intervention to decrease level of assist with basic self-care skills and increase independence with basic self-care skills prior to discharge home with care partner.  Anticipate patient will require 24 hour supervision and follow up home health.  OT - End of Session Activity Tolerance: Tolerates 10 - 20 min activity with multiple rests Endurance Deficit: Yes Endurance Deficit Description: breaks needed during ADLs OT Assessment Rehab Potential (ACUTE ONLY): Good OT Barriers to Discharge: Home environment access/layout;Inaccessible home environment OT Patient demonstrates impairments in the following area(s): Balance;Cognition;Endurance;Motor;Pain OT Basic ADL's Functional Problem(s): Eating;Grooming;Bathing;Dressing;Toileting OT Transfers Functional Problem(s): Toilet;Tub/Shower OT Additional Impairment(s): Fuctional Use of Upper Extremity OT Plan OT Intensity: Minimum of 1-2 x/day, 45 to 90 minutes OT Frequency: 5 out of 7 days OT Duration/Estimated Length of Stay: 10-12 days OT Treatment/Interventions: Balance/vestibular training;Discharge planning;Pain management;Self Care/advanced ADL retraining;Therapeutic Activities;UE/LE Coordination activities;Cognitive remediation/compensation;Disease mangement/prevention;Functional mobility training;Patient/family education;Skin care/wound managment;Therapeutic Exercise;DME/adaptive equipment instruction;Neuromuscular re-education;UE/LE Strength taining/ROM;Wheelchair propulsion/positioning OT Self Feeding Anticipated Outcome(s): Set up A OT Basic Self-Care Anticipated Outcome(s): Mod I/supervision OT Toileting Anticipated Outcome(s): Mod I OT Bathroom Transfers Anticipated Outcome(s): Mod I OT Recommendation Recommendations for Other Services: Therapeutic Recreation  consult Therapeutic Recreation Interventions: Pet therapy Patient destination: Home Follow Up Recommendations: 24 hour supervision/assistance;Home health OT Equipment Recommended: To be determined   OT Evaluation Precautions/Restrictions  Precautions Precautions: Fall Restrictions Weight Bearing Restrictions Per Provider Order: Yes RLE Weight Bearing Per Provider Order: Weight bearing as tolerated Home Living/Prior Functioning Home Living Family/patient expects to be discharged to:: Private residence Living Arrangements: Spouse/significant other Available Help at Discharge: Family, Available 24 hours/day  Type of Home: House Home Access: Stairs to enter [employer] of Steps: 2 in the back with post (for roof) they use as hand rail on R, 3 in front with bilateral hand rails but unable to reach both Entrance Stairs-Rails: Right, Left Home Layout: Able to live on main level with bedroom/bathroom Bathroom Shower/Tub: Walk-in shower, Tub/shower unit (tub/shower has curtain, walk in shower with glass door) Bathroom Toilet: Handicapped height Bathroom Accessibility: Yes Additional Comments: Has RW, rollator  Lives With: Spouse IADL History Homemaking Responsibilities: No Current License: Yes Occupation: Full time employment Type of Occupation: Retired from the [employer] Prior Function Level of Independence: Independent with gait, Independent with transfers, Needs assistance with ADLs Bath: Minimal (could shower with supervision but had anxiety therefore wife was assisting with sponge baths) Grooming: Supervision/set-up  Able to Take Stairs?: Yes Driving: No Vocation: Retired Administrator, sports Baseline Vision/History: 1 Wears glasses (dry cornea, blurred near vision) Ability to See in Adequate Light: 0 Adequate Patient Visual Report: No change from baseline Vision Assessment?: No apparent visual deficits Perception  Perception: Within Functional Limits Praxis Praxis:  WFL Cognition Cognition Overall Cognitive Status: Within Functional Limits for tasks assessed Arousal/Alertness: Awake/alert Orientation Level: Person;Place;Situation Person: Oriented Place: Oriented Situation: Oriented Memory: Appears intact Awareness: Appears intact Problem Solving: Appears intact Safety/Judgment: Impaired Brief Interview for Mental Status (BIMS) Repetition of Three Words (First Attempt): 3 Temporal Orientation: Year: Correct Temporal Orientation: Month: Accurate within 5 days Temporal Orientation: Day: Correct Recall: "Sock": Yes, no cue required Recall: "Blue": Yes, after cueing ("a color") Recall: "Bed": Yes, no cue required BIMS Summary Score: 14 Sensation Sensation Light Touch: Appears Intact Hot/Cold: Appears Intact Proprioception: Appears Intact Stereognosis: Not tested Additional Comments: Pt reports numbness at Rt knee region, however sensation WFL with formal testing Coordination Gross Motor Movements are Fluid and Coordinated: No Fine Motor Movements are Fluid and Coordinated: No Coordination and Movement Description: limited by RLE pain Finger Nose Finger Test: slow bilaterally, mild dysmetria Motor  Motor Motor: Within Functional Limits Motor - Skilled Clinical Observations: delayed initiation at times, but largely [facility] and limited by hip pain  Trunk/Postural Assessment  Cervical Assessment Cervical Assessment: Within Functional Limits Thoracic Assessment Thoracic Assessment: Within Functional Limits Lumbar Assessment Lumbar Assessment: Within Functional Limits Postural Control Postural Control: Within Functional Limits  Balance Balance Balance Assessed: Yes Static Sitting Balance Static Sitting - Balance Support: Feet supported Static Sitting - Level of Assistance: 5: Stand by assistance Dynamic Sitting Balance Dynamic Sitting - Balance Support: During functional activity Dynamic Sitting - Level of Assistance: 5: Stand by  assistance Static Standing Balance Static Standing - Balance Support: Bilateral upper extremity supported Static Standing - Level of Assistance: 5: Stand by assistance Dynamic Standing Balance Dynamic Standing - Balance Support: During functional activity;Bilateral upper extremity supported Dynamic Standing - Level of Assistance: 4: Min assist Extremity/Trunk Assessment RUE Assessment RUE Assessment: Exceptions to [facility] Active Range of Motion (AROM) Comments: Shoulders > wrist WFL, 1-3 digits with swan neck deformity General Strength Comments: 3+/5 grossly including grasp LUE Assessment LUE Assessment: Exceptions to [facility] Active Range of Motion (AROM) Comments: Shoulders > wrist WFL, 3-5 digits with swan neck deformity General Strength Comments: 3-/5 grossly including grasp  Care Tool Care Tool Self Care Eating   Eating Assist Level: Minimal Assistance - Patient > 75%    Oral Care    Oral Care Assist Level: Minimal Assistance - Patient > 75%    Bathing   Body parts bathed by patient: Right arm;Left arm;Chest;Abdomen;Front perineal  area;Buttocks;Right upper leg;Left upper leg;Face Body parts bathed by helper: Right lower leg;Left lower leg   Assist Level: Minimal Assistance - Patient > 75%    Upper Body Dressing(including orthotics)   What is the patient wearing?: Pull over shirt;Button up shirt   Assist Level: Minimal Assistance - Patient > 75%    Lower Body Dressing (excluding footwear)   What is the patient wearing?: Underwear/pull up;Pants Assist for lower body dressing: Moderate Assistance - Patient 50 - 74%    Putting on/Taking off footwear   What is the patient wearing?: Non-skid slipper socks Assist for footwear: Dependent - Patient 0%       Care Tool Toileting Toileting activity   Assist for toileting: Moderate Assistance - Patient 50 - 74%     Care Tool Bed Mobility Roll left and right activity        Sit to lying activity        Lying to sitting on side  of bed activity         Care Tool Transfers Sit to stand transfer   Sit to stand assist level: Contact Guard/Touching assist    Chair/bed transfer         Toilet transfer   Assist Level: Contact Guard/Touching assist     Care Tool Cognition  Expression of Ideas and Wants Expression of Ideas and Wants: 3. Some difficulty - exhibits some difficulty with expressing needs and ideas (e.g, some words or finishing thoughts) or speech is not clear  Understanding Verbal and Non-Verbal Content Understanding Verbal and Non-Verbal Content: 3. Usually understands - understands most conversations, but misses some part/intent of message. Requires cues at times to understand   Memory/Recall Ability Memory/Recall Ability : Current season;That he or she is in a hospital/hospital unit   Refer to Care Plan for Long Term Goals  SHORT TERM GOAL WEEK 1 OT Short Term Goal 1 (Week 1): Pt will complete sit > stand with supervision using LRAD OT Short Term Goal 2 (Week 1): Pt will complete LB dressing with CGA using AE PRN OT Short Term Goal 3 (Week 1): Pt will demonstrate improved initiation with ADL task with supervision needed for bathing and shirt at sinkside  Recommendations for other services: Therapeutic Recreation  Pet therapy   Skilled Therapeutic Intervention Patient received upright in w/c upon therapy arrival and agreeable to participate in OT evaluation. Education provided on OT purpose, therapy schedule, goals for therapy, and safety policy while in rehab. Unrated pain reported in Rt hip with mobility; pre-medicated. OT offered rest breaks, repositioning and ice provided for pain reduction.  Patient demonstrates mild cognitive impairments with delayed processing, dynamic standing balance, GMC and endurance deficits resulting in difficulty completing BADL tasks without increased physical assist. Cues needed throughout for initiation, sequencing, thoroughness with ADLs, body positioning and safety  with AD. Pt will benefit from skilled OT services to focus on mentioned deficits. See below for ADL and functional transfer performance. ADLs completed at sink level using counter or RW for UE support during dynamic standing tasks. Required CGA for ambulatory transfer > EOB and CGA for bed mobility sit > supine. Pt remained semi supine in bed at conclusion of session with bed alarm on and all needs met at end of session.  Vitals BP 106/66 (sitting in w/c prior to activity)  ADL ADL Eating: Minimal assistance Where Assessed-Eating: Wheelchair Grooming: Minimal assistance Where Assessed-Grooming: Sitting at sink Upper Body Bathing: Supervision/safety Where Assessed-Upper Body Bathing: Sitting at sink Lower Body  Bathing: Minimal assistance Where Assessed-Lower Body Bathing: Sitting at sink;Standing at sink Upper Body Dressing: Minimal assistance Where Assessed-Upper Body Dressing: Sitting at sink Lower Body Dressing: Moderate assistance Where Assessed-Lower Body Dressing: Sitting at sink;Standing at sink Toileting: Moderate assistance Where Assessed-Toileting: Bedside Commode Toilet Transfer: Contact guard Toilet Transfer Method: Proofreader: Bedside commode;Other (comment) (RW) Tub/Shower Transfer: Unable to assess Tub/Shower Transfer Method: Unable to assess Film/video editor: Unable to assess Film/video editor Method: Unable to assess Mobility  Bed Mobility Bed Mobility: Supine to Sit Supine to Sit: Contact Guard/Touching assist Transfers Sit to Stand: Contact Guard/Touching assist Stand to Sit: Contact Guard/Touching assist   Discharge Criteria: Patient will be discharged from OT if patient refuses treatment 3 consecutive times without medical reason, if treatment goals not met, if there is a change in medical status, if patient makes no progress towards goals or if patient is discharged from hospital.  The above assessment, treatment plan,  treatment alternatives and goals were discussed and mutually agreed upon: by patient and by family  Melvyn Novas, MS, OTR/L  05/24/2023, 12:21 PM

## 2023-05-24 NOTE — Progress Notes (Signed)
Inpatient Rehabilitation Care Coordinator Assessment and Plan Patient Details  Name: Jeremy [name] MRN: [id number] Date of Birth: 1935-08-12  Today's Date: 05/24/2023  Hospital Problems: Principal Problem:   Closed right hip fracture, initial encounter [facility])  Past Medical History:  Past Medical History:  Diagnosis Date   Aortic atherosclerosis (HCC)    BPH (benign prostatic hyperplasia)    Bronchiectasis (HCC)    Depression    Depression    GAD (generalized anxiety disorder)    Past Surgical History:  Past Surgical History:  Procedure Laterality Date   APPENDECTOMY     HERNIA REPAIR     TOTAL HIP ARTHROPLASTY Right 05/21/2023   Procedure: RIGHT TOTAL HIP ARTHROPLASTY;  Surgeon: Kathryne Hitch, MD;  Location: [facility] OR;  Service: Orthopedics;  Laterality: Right;   Social History:  reports that he has never smoked. He has never used smokeless tobacco. He reports that he does not currently use alcohol after a past usage of about 4.0 standard drinks of alcohol per week. He reports that he does not use drugs.  Family / Support Systems Marital Status: Married How Long?: 45 years Patient Roles: Spouse, Parent Spouse/Significant Other: [name] (wife) Children: Two adult children- Thurston Hole (dtr; lives in [place]), and Onalee Hua (son; lives in [place], [place]) Other Supports: PRN support from church family and friends Anticipated Caregiver: Wife will be primary caregiver Ability/Limitations of Caregiver: Wife will be primary caregiver. Caregiver Availability: 24/7 Family Dynamics: Pt lives with his wife.  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 He served in [employer] from [id number]. No VA benefits. Education: IT sales professional in Designer, multimedia - How often do you need to have someone help you when you read instructions, pamphlets, or other written material from your doctor or pharmacy?:  Never Writes: Yes Employment Status: Retired Date Retired/Disabled/Unemployed: 30 Age Retired: 43 Marine scientist Issues: Denies Guardian/Conservator: Product manager- wife Health visitor   Abuse/Neglect Abuse/Neglect Assessment Can Be Completed: Yes Physical Abuse: Denies Verbal Abuse: Denies Sexual Abuse: Denies Exploitation of patient/patient's resources: Denies Self-Neglect: Denies  Patient response to: Social Isolation - How often do you feel lonely or isolated from those around you?: Never  Emotional Status Pt's affect, behavior and adjustment status: Pt in good spirits at time of visit. Recent Psychosocial Issues: Pt reports he is doing as well as he has been prior to being at home (in reference to depression). Psychiatric History: Pt reports that he suffers from depression and takes medication. meds prescribed through RN with [facility]. Substance Abuse History: Denies any tobacco product use, etoh, and rec drug use.  Patient / Family Perceptions, Expectations & Goals Pt/Family understanding of illness & functional limitations: Pt and wife have a general understanding of pt care needs. Premorbid pt/family roles/activities: Independent Anticipated changes in roles/activities/participation: Assistance with ADLs/IADls Pt/family expectations/goals: Pt goal is to work on getting spirits up, and helping recover since hip replacement.  Community Resources [employer]: None Premorbid Home Care/DME Agencies: None Transportation available at discharge: Wife [name] Is the patient able to respond to transportation needs?: Yes In the past 12 months, has lack of transportation kept you from medical appointments or from getting medications?: No In the past 12 months, has lack of transportation kept you from meetings, work, or from getting things needed for daily living?: No Resource referrals recommended: Neuropsychology  Discharge Planning Living Arrangements:  Spouse/significant other Support Systems: Spouse/significant other, Friends/neighbors, Psychologist, clinical community Type of Residence: Private residence  Insurance Resources: Electrical engineer Resources: Social Security, Other (Comment) (retirement/savings) Financial Screen Referred: No Living Expenses: Own Money Management: Spouse Does the patient have any problems obtaining your medications?: No Home Management: Pt wife managed all homecare needs Patient/Family Preliminary Plans: No changes Care Coordinator Barriers to Discharge: Decreased caregiver support, Lack of/limited family support Care Coordinator Anticipated Follow Up Needs: HH/OP  Clinical Impression SW met with pt and pt wife in room to introduce self, explain role, and discuss discharge process. DME at home includes access to a RW .   Sayana Salley A [place] Seaborn 05/24/2023, 2:09 PM

## 2023-05-24 NOTE — IPOC Note (Signed)
Overall Plan of Care [facility]) Patient Details Name: Jeremy [name] MRN: [id number] DOB: [birth year]  Admitting Diagnosis: Closed right hip fracture, initial encounter [facility])  Hospital Problems: Principal Problem:   Closed right hip fracture, initial encounter [facility])     Functional Problem List: Nursing Bowel, Endurance, Medication Management, Pain, Skin Integrity, Motor  PT Pain, Safety, Endurance, Sensory, Motor  OT Balance, Cognition, Endurance, Motor, Pain  SLP    TR         Basic ADL's: OT Eating, Grooming, Bathing, Dressing, Toileting     Advanced  ADL's: OT       Transfers: PT Bed Mobility, Bed to Chair, Customer service manager, Tub/Shower     Locomotion: PT Ambulation, Wheelchair Mobility     Additional Impairments: OT Fuctional Use of Upper Extremity  SLP        TR      Anticipated Outcomes Item Anticipated Outcome  Self Feeding Set up A  Swallowing      Basic self-care  Mod I/supervision  Toileting  Mod I   Bathroom Transfers Mod I  Bowel/Bladder  Treat s/s of constipation with prns, maintain continence  Transfers  mod I with LRAD  Locomotion  supervision gait, CGA stairs  Communication     Cognition     Pain  < 4 with prns  Safety/Judgment  no falls   Therapy Plan: PT Intensity: Minimum of 1-2 x/day ,45 to 90 minutes PT Frequency: 5 out of 7 days PT Duration Estimated Length of Stay: 10-12 days OT Intensity: Minimum of 1-2 x/day, 45 to 90 minutes OT Frequency: 5 out of 7 days OT Duration/Estimated Length of Stay: 10-12 days     Team Interventions: Nursing Interventions Patient/Family Education, Bowel Management, Pain Management, Medication Management, Skin Care/Wound Management, Discharge Planning  PT interventions Ambulation/gait training, Cognitive remediation/compensation, Discharge planning, DME/adaptive equipment instruction, Functional mobility training, Pain management, Psychosocial support, Splinting/orthotics, Therapeutic Activities, UE/LE  Strength taining/ROM, Wheelchair propulsion/positioning, UE/LE Coordination activities, Therapeutic Exercise, Stair training, Skin care/wound management, Neuromuscular re-education, Patient/family education, Disease management/prevention, Firefighter, Warden/ranger  OT Interventions Warden/ranger, Discharge planning, Pain management, Self Care/advanced ADL retraining, Therapeutic Activities, UE/LE Coordination activities, Cognitive remediation/compensation, Disease mangement/prevention, Functional mobility training, Patient/family education, Skin care/wound managment, Therapeutic Exercise, DME/adaptive equipment instruction, Neuromuscular re-education, UE/LE Strength taining/ROM, Wheelchair propulsion/positioning  SLP Interventions    TR Interventions    SW/CM Interventions Discharge Planning, Psychosocial Support, Patient/Family Education   Barriers to Discharge MD  Medical stability, Home enviroment access/loayout, Lack of/limited family support, Weight bearing restrictions, and Behavior  Nursing Decreased caregiver support, Home environment access/layout, Lack of/limited family support, Weight bearing restrictions 2 level home with 2-3 steps, right rail. Bed/bath main level. Spouse supervision to min assist.  PT Inaccessible home environment, Home environment access/layout, Wound Care, Weight bearing restrictions    OT Home environment access/layout, Inaccessible home environment    SLP      SW Decreased caregiver support, Lack of/limited family support, Community education officer for SNF coverage     Team Discharge Planning: Destination: PT-Home ,OT- Home , SLP-  Projected Follow-up: PT-None, OT-  24 hour supervision/assistance, Home health OT, SLP-  Projected Equipment Needs: PT-Rolling walker with 5" wheels, OT- To be determined, SLP-  Equipment Details: PT- , OT-  Patient/family involved in discharge planning: PT- Patient,  OT-Patient, Family member/caregiver,  SLP-   MD ELOS: 10-12 days Medical Rehab Prognosis:  Good Assessment: The patient has been admitted for CIR therapies with the diagnosis of  R hip fracture.  The team will be addressing functional mobility, strength, stamina, balance, safety, adaptive techniques and equipment, self-care, bowel and bladder mgt, patient and caregiver education, . Goals have been set at mod I to supervision. Anticipated discharge destination is home with wife.        See Team Conference Notes for weekly updates to the plan of care't

## 2023-05-24 NOTE — Progress Notes (Signed)
PROGRESS NOTE   Subjective/Complaints:   Pt reports hasn't received Breakfast- d/w nursing- they thought he was having swallowing issues- esp since hadn't had evals yet- explained that he's here for hip fracture and hasn't been having swallowing issues-but will see how does this AM with breakfast/let me know.   Pt reports pain 7/10 this AM- per nursing, told her no pain overnight.  Asking for pain meds.     ROS:  Pt denies SOB, abd pain, CP, N/V/C/D, and vision changes  Negative except for HPI  Objective:   No results found. Recent Labs    05/23/23 0557 05/24/23 0624  WBC 12.1* 8.4  HGB 9.1* 9.3*  HCT 26.8* 27.2*  PLT 155 181   Recent Labs    05/23/23 0557 05/24/23 0624  NA 132* 134*  K 3.7 3.7  CL 100 101  CO2 24 24  GLUCOSE 117* 104*  BUN 13 10  CREATININE 0.94 0.71  CALCIUM 8.3* 8.4*   No intake or output data in the 24 hours ending 05/24/23 0817      Physical Exam: Vital Signs Blood pressure 132/69, pulse 80, temperature 98.1 F (36.7 C), temperature source Oral, resp. rate 18, height 5\' 6"  (1.676 m), weight 55.9 kg, SpO2 97%.     General: awake, alert, supine in bed- very little spontaneous speech; no food at bedside; frail appearing- BMI <20; NAD HENT: conjugate gaze; oropharynx moist CV: regular rate and rhythm; no JVD Pulmonary: CTA B/L; no W/R/R- good air movement GI: soft, NT, ND, (+)BS- hypoactive Psychiatric: extremely depressed affect- very flat Neurological: Ox3 Musculoskeletal:     Cervical back: Neck supple. No tenderness.     Right lower leg: No edema.     Left lower leg: No edema.     Comments: Ue's 5-/5 B/L RLE- HF 2/5; DF/PF 4+/5 LLE- 5-/5 throughout  Skin:    General: Skin is warm.     Comments: Moist; not hot to touch; surgical dressing dry and intact- no signs of drainage through dressing   Assessment/Plan: 1. Functional deficits which require 3+ hours per day of  interdisciplinary therapy in a comprehensive inpatient rehab setting. Physiatrist is providing close team supervision and 24 hour management of active medical problems listed below. Physiatrist and rehab team continue to assess barriers to discharge/monitor patient progress toward functional and medical goals  Care Tool:  Bathing              Bathing assist       Upper Body Dressing/Undressing Upper body dressing        Upper body assist      Lower Body Dressing/Undressing Lower body dressing            Lower body assist       Toileting Toileting    Toileting assist       Transfers Chair/bed transfer  Transfers assist           Locomotion Ambulation   Ambulation assist              Walk 10 feet activity   Assist           Walk 50 feet activity  Assist           Walk 150 feet activity   Assist           Walk 10 feet on uneven surface  activity   Assist           Wheelchair     Assist               Wheelchair 50 feet with 2 turns activity    Assist            Wheelchair 150 feet activity     Assist          Blood pressure 132/69, pulse 80, temperature 98.1 F (36.7 C), temperature source Oral, resp. rate 18, height 5\' 6"  (1.676 m), weight 55.9 kg, SpO2 97%.    Medical Problem List and Plan: 1. Functional deficits secondary to R hip fx s/p THA WBAT             -patient may  shower- cover incision/dressing             -ELOS/Goals: 14-18 days supervision   Admit to Cir- first day of evaluations- Con't CIR PT and OT starts at 9am today- d/w pt 2.  Antithrombotics: -DVT/anticoagulation:  Pharmaceutical: Lovenox start on CIR admission             -antiplatelet therapy: plan aspirin 81 mg BID at discharge             -check BLE venous duplex   3. Pain Management: Tylenol 650 mg q 6 hours -Robaxin as needed -oxycodone as needed   12/20- pain 7/10 this AM- asked nursing to give  pain meds- pt feels meds work when takes them 4. Mood/Behavior/Sleep: LCSW to evaluate and provide emotional support             -continue Abilify 2 mg q HS             -continue Cymbalta DR 120 mg daily             -continue Remeron 30 mg q HS             -antipsychotic agents: n/a   12/20- no changes to depression meds- is chronic dosing 5. Neuropsych/cognition: This patient is capable of making decisions on his own behalf.   6. Skin/Wound Care: Routine skin care checks             -monitor surgical incision (has surgical staples)   12/20- has surgical dressing- con't to monitor 7. Fluids/Electrolytes/Nutrition: Routine Is and Os and follow-up chemistries   12/20- labs look better- Na up to 134-  8: BPH: continue Proscar 5 mg q AM and Flomax 0.4 mg q 48 hours   9: Right hip displaced subcapital femoral neck fracture s/p right direct anterior total hip arthroplasty 05/21/2023 by Dr. Magnus Ivan             -WBAT             -follow-up in 2 weeks   10: ABLA/iron deficiency:              -consider oral iron supplementation             -follow-up CBC   11: Hyponatremia: follow-up BMP   12/20- up to 134 12: Leukocytosis: febrile to 100.4 at ~4 am; currently afebrile             -monitor and follow-up CBC with diff             -  FV/IS   12/20- WBC down to 8.4- is afebrile 13. Hypotension  12/20- BP dropped to 93/51 at lowest last night- pt denies sx's of orthostasis, but was in bed- will monitor with therapy- might need TEDs/ and maybe Midodrine?   I spent a total of 38   minutes on total care today- >50% coordination of care- due to  D/w nursing x2- also review of labs, vitals and concern about orthostatic hypotension.  Sent message ot Nursing and PT/OT about this. Will also do IPOC  LOS: 1 days A FACE TO FACE EVALUATION WAS PERFORMED  Darren Caldron 05/24/2023, 8:17 AM

## 2023-05-24 NOTE — Discharge Instructions (Addendum)
Inpatient Rehab Discharge Instructions  Jeremy [name] Discharge date and time:  06/03/2023  Activities/Precautions/ Functional Status: Activity: no lifting, driving, or strenuous exercise until cleared by MD Diet: regular diet Wound Care: keep wound clean and dry Functional status:  ___ No restrictions     ___ Walk up steps independently __x_ 24/7 supervision/assistance   ___ Walk up steps with assistance ___ Intermittent supervision/assistance  ___ Bathe/dress independently ___ Walk with walker     ___ Bathe/dress with assistance ___ Walk Independently    ___ Shower independently ___ Walk with assistance    _x__ Shower with assistance __x_ No alcohol     ___ Return to work/school ________  Special Instructions: No driving, alcohol consumption or tobacco use.   COMMUNITY REFERRALS UPON DISCHARGE:    Outpatient: PT     OT             Agency:[facility]     Phone:[phone]              Appointment Date/Time:*Please expect follow-up within 7-10 business days to schedule your appointment. If you have not received follow-up, be sure to contact the site directly.*   My questions have been answered and I understand these instructions. I will adhere to these goals and the provided educational materials after my discharge from the hospital.  Patient/Caregiver Signature _______________________________ Date __________  Clinician Signature _______________________________________ Date __________  Please bring this form and your medication list with you to all your follow-up doctor's appointments.

## 2023-05-25 DIAGNOSIS — K5901 Slow transit constipation: Secondary | ICD-10-CM | POA: Diagnosis not present

## 2023-05-25 DIAGNOSIS — S72001A Fracture of unspecified part of neck of right femur, initial encounter for closed fracture: Secondary | ICD-10-CM | POA: Diagnosis not present

## 2023-05-25 NOTE — Progress Notes (Signed)
PROGRESS NOTE   Subjective/Complaints:   Pt doing well, slept ok, pain managed alright, LBM 1-2 days ago but pt isn't sure and there's no documentation of it. Urinating ok-- some difficulty with being laid down, but understands this is normal. Denies any other complaints or concerns today.     ROS:  Pt denies SOB, abd pain, CP, N/V/C/D, and vision changes  Negative except for HPI  Objective:   VAS [place] LOWER EXTREMITY VENOUS (DVT) Result Date: 05/25/2023  Lower Venous DVT Study Patient Name:  Jeremy [name]  Date of Exam:   05/24/2023 Medical Rec #: [id number]   Accession #:    [id number] Date of Birth: 10/14/1935   Patient Gender: M Patient Age:   87 years Exam Location:  [facility] Procedure:      VAS [place] LOWER EXTREMITY VENOUS (DVT) Referring Phys: Wendi Maya --------------------------------------------------------------------------------  Indications: Edema.  Risk Factors: Surgery Hip arthroplasty 05/21/23 Trauma Fall 05/20/23. Anticoagulation: Lovenox. Limitations: Limited range of patient motion. Comparison Study: None Performing Technologist: Shona Simpson  Examination Guidelines: A complete evaluation includes B-mode imaging, spectral Doppler, color Doppler, and power Doppler as needed of all accessible portions of each vessel. Bilateral testing is considered an integral part of a complete examination. Limited examinations for reoccurring indications may be performed as noted. The reflux portion of the exam is performed with the patient in reverse Trendelenburg.  +---------+---------------+---------+-----------+----------+--------------+ RIGHT    CompressibilityPhasicitySpontaneityPropertiesThrombus Aging +---------+---------------+---------+-----------+----------+--------------+ CFV      Full           Yes      Yes                                  +---------+---------------+---------+-----------+----------+--------------+ SFJ      Full                                                        +---------+---------------+---------+-----------+----------+--------------+ FV Prox  Full                                                        +---------+---------------+---------+-----------+----------+--------------+ FV Mid   Full                                                        +---------+---------------+---------+-----------+----------+--------------+ FV DistalFull                                                        +---------+---------------+---------+-----------+----------+--------------+  PFV      Full                                                        +---------+---------------+---------+-----------+----------+--------------+ POP      Full           Yes      Yes                                 +---------+---------------+---------+-----------+----------+--------------+ PTV      Full                                                        +---------+---------------+---------+-----------+----------+--------------+ PERO     Full                                                        +---------+---------------+---------+-----------+----------+--------------+   +---------+---------------+---------+-----------+----------+--------------+ LEFT     CompressibilityPhasicitySpontaneityPropertiesThrombus Aging +---------+---------------+---------+-----------+----------+--------------+ CFV      Full           Yes      Yes                                 +---------+---------------+---------+-----------+----------+--------------+ SFJ      Full                                                        +---------+---------------+---------+-----------+----------+--------------+ FV Prox  Full                                                         +---------+---------------+---------+-----------+----------+--------------+ FV Mid   Full                                                        +---------+---------------+---------+-----------+----------+--------------+ FV DistalFull                                                        +---------+---------------+---------+-----------+----------+--------------+ PFV      Full                                                        +---------+---------------+---------+-----------+----------+--------------+  POP      Full           Yes      Yes                                 +---------+---------------+---------+-----------+----------+--------------+ PTV      Full                                                        +---------+---------------+---------+-----------+----------+--------------+ PERO     Full                                                        +---------+---------------+---------+-----------+----------+--------------+     Summary: BILATERAL: - No evidence of deep vein thrombosis seen in the lower extremities, bilaterally. -No evidence of popliteal cyst, bilaterally.   *See table(s) above for measurements and observations. Electronically signed by Coral Else MD on 05/25/2023 at 10:06:32 AM.    Final    Recent Labs    05/23/23 0557 05/24/23 0624  WBC 12.1* 8.4  HGB 9.1* 9.3*  HCT 26.8* 27.2*  PLT 155 181   Recent Labs    05/23/23 0557 05/24/23 0624  NA 132* 134*  K 3.7 3.7  CL 100 101  CO2 24 24  GLUCOSE 117* 104*  BUN 13 10  CREATININE 0.94 0.71  CALCIUM 8.3* 8.4*    Intake/Output Summary (Last 24 hours) at 05/25/2023 1106 Last data filed at 05/25/2023 0825 Gross per 24 hour  Intake 598 ml  Output 200 ml  Net 398 ml        Physical Exam: Vital Signs Blood pressure (!) 145/65, pulse 89, temperature 97.6 F (36.4 C), temperature source Oral, resp. rate 17, height 5\' 6"  (1.676 m), weight 55.9 kg, SpO2 98%.     General:  awake, alert, supine in bed; frail appearing- BMI <20; NAD HENT: conjugate gaze; oropharynx moist CV: regular rate and rhythm; no JVD Pulmonary: CTA B/L; no W/R/R- good air movement GI: soft, NT, ND, (+)BS- a bit hypoactive Psychiatric: extremely depressed affect- very flat  PRIOR EXAMS: Neurological: Ox3 Musculoskeletal:     Cervical back: Neck supple. No tenderness.     Right lower leg: No edema.     Left lower leg: No edema.     Comments: Ue's 5-/5 B/L RLE- HF 2/5; DF/PF 4+/5 LLE- 5-/5 throughout  Skin:    General: Skin is warm.     Comments: Moist; not hot to touch; surgical dressing dry and intact- no signs of drainage through dressing   Assessment/Plan: 1. Functional deficits which require 3+ hours per day of interdisciplinary therapy in a comprehensive inpatient rehab setting. Physiatrist is providing close team supervision and 24 hour management of active medical problems listed below. Physiatrist and rehab team continue to assess barriers to discharge/monitor patient progress toward functional and medical goals  Care Tool:  Bathing    Body parts bathed by patient: Right arm, Left arm, Chest, Abdomen, Front perineal area, Buttocks, Right upper leg, Left upper leg, Face   Body parts bathed by helper: Right lower leg, Left lower leg  Bathing assist Assist Level: Minimal Assistance - Patient > 75%     Upper Body Dressing/Undressing Upper body dressing   What is the patient wearing?: Pull over shirt, Button up shirt    Upper body assist Assist Level: Minimal Assistance - Patient > 75%    Lower Body Dressing/Undressing Lower body dressing      What is the patient wearing?: Underwear/pull up, Pants     Lower body assist Assist for lower body dressing: Moderate Assistance - Patient 50 - 74%     Toileting Toileting    Toileting assist Assist for toileting: Moderate Assistance - Patient 50 - 74%     Transfers Chair/bed transfer  Transfers assist      Chair/bed transfer assist level: Contact Guard/Touching assist     Locomotion Ambulation   Ambulation assist      Assist level: Contact Guard/Touching assist Assistive device: Walker-rolling Max distance: 20   Walk 10 feet activity   Assist     Assist level: Contact Guard/Touching assist Assistive device: Walker-rolling   Walk 50 feet activity   Assist Walk 50 feet with 2 turns activity did not occur: Safety/medical concerns         Walk 150 feet activity   Assist Walk 150 feet activity did not occur: Safety/medical concerns         Walk 10 feet on uneven surface  activity   Assist Walk 10 feet on uneven surfaces activity did not occur: Safety/medical concerns         Wheelchair     Assist Is the patient using a wheelchair?: Yes Type of Wheelchair: Manual    Wheelchair assist level: Supervision/Verbal cueing Max wheelchair distance: 10    Wheelchair 50 feet with 2 turns activity    Assist        Assist Level: Dependent - Patient 0%   Wheelchair 150 feet activity     Assist      Assist Level: Dependent - Patient 0%   Blood pressure (!) 145/65, pulse 89, temperature 97.6 F (36.4 C), temperature source Oral, resp. rate 17, height 5\' 6"  (1.676 m), weight 55.9 kg, SpO2 98%.    Medical Problem List and Plan: 1. Functional deficits secondary to R hip fx s/p THA WBAT             -patient may  shower- cover incision/dressing             -ELOS/Goals: 14-18 days supervision -Continue CIR 2.  Antithrombotics: -DVT/anticoagulation:  Pharmaceutical: Lovenox 40mg  daily             -antiplatelet therapy: plan aspirin 81 mg BID at discharge             -05/25/23 BLE venous duplex negative   3. Pain Management: Tylenol 650 mg q 6 hours -Robaxin as needed -oxycodone as needed -12/20- pain 7/10 this AM- asked nursing to give pain meds- pt feels meds work when takes them 4. Mood/Behavior/Sleep: LCSW to evaluate and provide  emotional support             -continue Abilify 2 mg q HS             -continue Cymbalta DR 120 mg daily             -continue Remeron 30 mg q HS             -antipsychotic agents: n/a   12/20- no changes to depression meds- is chronic dosing 5. Neuropsych/cognition: This patient  is capable of making decisions on his own behalf.   6. Skin/Wound Care: Routine skin care checks             -monitor surgical incision (has surgical staples)   12/20- has surgical dressing- con't to monitor 7. Fluids/Electrolytes/Nutrition: Routine Is and Os and follow-up chemistries   12/20- labs look better- Na up to 134-  8: BPH: continue Proscar 5 mg q AM and Flomax 0.4 mg q 48 hours   9: Right hip displaced subcapital femoral neck fracture s/p right direct anterior total hip arthroplasty 05/21/2023 by Dr. Magnus Ivan             -WBAT             -follow-up in 2 weeks   10: ABLA/iron deficiency:              -consider oral iron supplementation             -follow-up CBC   11: Hyponatremia: follow-up BMP   12/20- up to 134 12: Leukocytosis: febrile to 100.4 at ~4 am; currently afebrile             -monitor and follow-up CBC with diff             -FV/IS   -12/20- WBC down to 8.4- is afebrile 13. Hypotension 12/20- BP dropped to 93/51 at lowest last night- pt denies sx's of orthostasis, but was in bed- will monitor with therapy- might need TEDs/ and maybe Midodrine? 14. Constipation:  -05/25/23 unclear when LBM was, maybe 1-2 days ago, on colace 100mg  BID, senna 8.6mg  daily, and miralax qM/W/F; monitor if no BM this weekend then may need to adjust meds.    LOS: 2 days A FACE TO FACE EVALUATION WAS PERFORMED  [address] Shalaunda Weatherholtz 05/25/2023, 11:06 AM

## 2023-05-26 DIAGNOSIS — K5901 Slow transit constipation: Secondary | ICD-10-CM | POA: Diagnosis not present

## 2023-05-26 DIAGNOSIS — S72001A Fracture of unspecified part of neck of right femur, initial encounter for closed fracture: Secondary | ICD-10-CM | POA: Diagnosis not present

## 2023-05-26 MED ORDER — POLYETHYLENE GLYCOL 3350 17 G PO PACK
17.0000 g | PACK | Freq: Every day | ORAL | Status: DC
Start: 2023-05-26 — End: 2023-05-29
  Administered 2023-05-26 – 2023-05-29 (×3): 17 g via ORAL
  Filled 2023-05-26 (×3): qty 1

## 2023-05-26 NOTE — Progress Notes (Signed)
Physical Therapy Session Note  Patient Details  Name: Jeremy [name] MRN: [id number] Date of Birth: [birth year]  Today's Date: 05/26/2023 PT Individual Time: 0803-0900 and 1430-1540 PT Individual Time Calculation (min): 57 min and 70 min  Short Term Goals: Week 1:  PT Short Term Goal 1 (Week 1): pt will navigate stairs x 2 with CGA PT Short Term Goal 2 (Week 1): Pt will ambulate x 100 ft with LRAD  Skilled Therapeutic Interventions/Progress Updates: Pt presented in bed with wife predsent initialy agreeable to therapy. Pt states pain controlled at start of session. Session focused on RLE strengthening, transfers, and gait. Pt participated in supine therex as "warm up" prior to OOB.  Ankle pumps x 20 Hip abd/add 2 x 10 Heel slides 2 x 10 SLR 2 x 10  Pt then completed supine to sit with CGA and increased time with HOB elevated ~20 degrees. Pt able to scoot anteriorly to EOB with close supervision and PTA donned shoes total A for time management. Pt completed ambulatory transfer to w/c with CGA and RW. Pt moved over to sink and washed face mod I. Pt then transported to nsg station to receive pain meds en route to day room. Per discussion with pt some increased pain with therex. Educated pt on taking PRN pain meds as needed to not allow pain to increase too significantly. In day room completed Cybex Kinetron 70cm/sec 1 min on/off x 3 rounds for ROM. Pt then worked on Investment banker, operational with RW 138ft with CGA. Pt with slight forward flexed posture, shortened step length, and slight antalgic gait. Pt with seated rest at mat before transferring to w/c. Pt transported back to room and agreeable to remain in w/c at end of session. Pt left with belt alarm on, call bell within reach and needs met.  Tx2: Pt presented sitting EOB with wife present agreeable to therapy.Pt states pain currently 7/10, premedicated, increased to 9/10 with standing tasks. Rest breaks provided throughout session as needed. Pt stood with  close supervision and ambulation to main gym >174ft with RW and CGA. Pt demonstrating mild antalgic gait with decreased step length and decreased foot clearance. Pt participated in gentle seated LE therex including LAQ with 2.5lb cuff 2 x 10, hamstring pulls red theraband 2 x 10, standing hip flexion to 4in step with 2.5lb cuff 2 x 10. Pt then completed door threshold negotiation ambulating over x 4 dowels, pt with good RW control and safely clearing dowels each time.   Participated in standing balance/tolerance activity placing clothespins on basketball net. Pt performed x 2 once on non-compliant then on compliant surface. Pt was light CGA fading to close supervision on both surfaces with pt at time releasing both hands on RW. Pt then completed transfer to w/c and transported to ortho gym for time management. Pt transferred to NuStep and completed NuStep L3 x 9 min for ROM and general conditioning. Pt indicating some increase in pain with NuStep however not as significant as when ambulating. Pt completed ambulatory transfer back to w/c and transported back to room. Pt completed ambulatory transfer to bed with RW and CGA and completed sit to supine transfer to flat bed with supervision and increased time. Pt left in bed at end of session with bed alarm on, call bell within reach and needs met.       Therapy Documentation Precautions:  Precautions Precautions: Fall Restrictions Weight Bearing Restrictions Per Provider Order: Yes RLE Weight Bearing Per Provider Order: Weight bearing as tolerated General:  Therapy/Group: Individual Therapy  Jeremy [name] 05/26/2023, 12:48 PM

## 2023-05-26 NOTE — Progress Notes (Signed)
PROGRESS NOTE   Subjective/Complaints:   Pt doing well again today, but slept poorly because he got wet and didn't ask for help for a little while. Knows to ask for help if he needs it. Also got a different urinal to help with avoiding leaks. Pain controlled, LBM "a couple days ago" and no documentation of when, will increase miralax. Urinating ok. Denies any other complaints or concerns today.     ROS:  Pt denies SOB, abd pain, CP, N/V/D, and vision changes  Negative except for HPI  Objective:   VAS [place] LOWER EXTREMITY VENOUS (DVT) Result Date: 05/25/2023  Lower Venous DVT Study Patient Name:  LYNDA MORALE  Date of Exam:   05/24/2023 Medical Rec #: [id number]   Accession #:    [id number] Date of Birth: 08-Jun-1935   Patient Gender: M Patient Age:   87 years Exam Location:  [facility] Procedure:      VAS [place] LOWER EXTREMITY VENOUS (DVT) Referring Phys: Wendi Maya --------------------------------------------------------------------------------  Indications: Edema.  Risk Factors: Surgery Hip arthroplasty 05/21/23 Trauma Fall 05/20/23. Anticoagulation: Lovenox. Limitations: Limited range of patient motion. Comparison Study: None Performing Technologist: Shona Simpson  Examination Guidelines: A complete evaluation includes B-mode imaging, spectral Doppler, color Doppler, and power Doppler as needed of all accessible portions of each vessel. Bilateral testing is considered an integral part of a complete examination. Limited examinations for reoccurring indications may be performed as noted. The reflux portion of the exam is performed with the patient in reverse Trendelenburg.  +---------+---------------+---------+-----------+----------+--------------+ RIGHT    CompressibilityPhasicitySpontaneityPropertiesThrombus Aging +---------+---------------+---------+-----------+----------+--------------+ CFV      Full           Yes      Yes                                  +---------+---------------+---------+-----------+----------+--------------+ SFJ      Full                                                        +---------+---------------+---------+-----------+----------+--------------+ FV Prox  Full                                                        +---------+---------------+---------+-----------+----------+--------------+ FV Mid   Full                                                        +---------+---------------+---------+-----------+----------+--------------+ FV DistalFull                                                        +---------+---------------+---------+-----------+----------+--------------+  PFV      Full                                                        +---------+---------------+---------+-----------+----------+--------------+ POP      Full           Yes      Yes                                 +---------+---------------+---------+-----------+----------+--------------+ PTV      Full                                                        +---------+---------------+---------+-----------+----------+--------------+ PERO     Full                                                        +---------+---------------+---------+-----------+----------+--------------+   +---------+---------------+---------+-----------+----------+--------------+ LEFT     CompressibilityPhasicitySpontaneityPropertiesThrombus Aging +---------+---------------+---------+-----------+----------+--------------+ CFV      Full           Yes      Yes                                 +---------+---------------+---------+-----------+----------+--------------+ SFJ      Full                                                        +---------+---------------+---------+-----------+----------+--------------+ FV Prox  Full                                                         +---------+---------------+---------+-----------+----------+--------------+ FV Mid   Full                                                        +---------+---------------+---------+-----------+----------+--------------+ FV DistalFull                                                        +---------+---------------+---------+-----------+----------+--------------+ PFV      Full                                                        +---------+---------------+---------+-----------+----------+--------------+  POP      Full           Yes      Yes                                 +---------+---------------+---------+-----------+----------+--------------+ PTV      Full                                                        +---------+---------------+---------+-----------+----------+--------------+ PERO     Full                                                        +---------+---------------+---------+-----------+----------+--------------+     Summary: BILATERAL: - No evidence of deep vein thrombosis seen in the lower extremities, bilaterally. -No evidence of popliteal cyst, bilaterally.   *See table(s) above for measurements and observations. Electronically signed by Coral Else MD on 05/25/2023 at 10:06:32 AM.    Final    Recent Labs    05/24/23 0624  WBC 8.4  HGB 9.3*  HCT 27.2*  PLT 181   Recent Labs    05/24/23 0624  NA 134*  K 3.7  CL 101  CO2 24  GLUCOSE 104*  BUN 10  CREATININE 0.71  CALCIUM 8.4*    Intake/Output Summary (Last 24 hours) at 05/26/2023 1117 Last data filed at 05/26/2023 1000 Gross per 24 hour  Intake 473 ml  Output 300 ml  Net 173 ml        Physical Exam: Vital Signs Blood pressure 139/86, pulse 81, temperature (!) 97.5 F (36.4 C), temperature source Oral, resp. rate 17, height 5\' 6"  (1.676 m), weight 55.9 kg, SpO2 100%.     General: awake, alert, using RW with therapy; frail appearing- BMI <20; NAD HENT: conjugate  gaze; oropharynx moist CV: regular rate and rhythm; no JVD Pulmonary: CTA B/L; no W/R/R- good air movement GI: soft, NT, ND, (+)BS- a bit hypoactive Psychiatric: extremely depressed affect- very flat  PRIOR EXAMS: Neurological: Ox3 Musculoskeletal:     Cervical back: Neck supple. No tenderness.     Right lower leg: No edema.     Left lower leg: No edema.     Comments: Ue's 5-/5 B/L RLE- HF 2/5; DF/PF 4+/5 LLE- 5-/5 throughout  Skin:    General: Skin is warm.     Comments: Moist; not hot to touch; surgical dressing dry and intact- no signs of drainage through dressing   Assessment/Plan: 1. Functional deficits which require 3+ hours per day of interdisciplinary therapy in a comprehensive inpatient rehab setting. Physiatrist is providing close team supervision and 24 hour management of active medical problems listed below. Physiatrist and rehab team continue to assess barriers to discharge/monitor patient progress toward functional and medical goals  Care Tool:  Bathing    Body parts bathed by patient: Right arm, Left arm, Chest, Abdomen, Front perineal area, Buttocks, Right upper leg, Left upper leg, Face   Body parts bathed by helper: Right lower leg, Left lower leg     Bathing assist Assist Level: Minimal Assistance - Patient > 75%  Upper Body Dressing/Undressing Upper body dressing   What is the patient wearing?: Pull over shirt, Button up shirt    Upper body assist Assist Level: Minimal Assistance - Patient > 75%    Lower Body Dressing/Undressing Lower body dressing      What is the patient wearing?: Underwear/pull up, Pants     Lower body assist Assist for lower body dressing: Moderate Assistance - Patient 50 - 74%     Toileting Toileting    Toileting assist Assist for toileting: Moderate Assistance - Patient 50 - 74%     Transfers Chair/bed transfer  Transfers assist     Chair/bed transfer assist level: Contact Guard/Touching assist      Locomotion Ambulation   Ambulation assist      Assist level: Contact Guard/Touching assist Assistive device: Walker-rolling Max distance: 20   Walk 10 feet activity   Assist     Assist level: Contact Guard/Touching assist Assistive device: Walker-rolling   Walk 50 feet activity   Assist Walk 50 feet with 2 turns activity did not occur: Safety/medical concerns         Walk 150 feet activity   Assist Walk 150 feet activity did not occur: Safety/medical concerns         Walk 10 feet on uneven surface  activity   Assist Walk 10 feet on uneven surfaces activity did not occur: Safety/medical concerns         Wheelchair     Assist Is the patient using a wheelchair?: Yes Type of Wheelchair: Manual    Wheelchair assist level: Supervision/Verbal cueing Max wheelchair distance: 10    Wheelchair 50 feet with 2 turns activity    Assist        Assist Level: Dependent - Patient 0%   Wheelchair 150 feet activity     Assist      Assist Level: Dependent - Patient 0%   Blood pressure 139/86, pulse 81, temperature (!) 97.5 F (36.4 C), temperature source Oral, resp. rate 17, height 5\' 6"  (1.676 m), weight 55.9 kg, SpO2 100%.    Medical Problem List and Plan: 1. Functional deficits secondary to R hip fx s/p THA WBAT             -patient may  shower- cover incision/dressing             -ELOS/Goals: 14-18 days supervision -Continue CIR 2.  Antithrombotics: -DVT/anticoagulation:  Pharmaceutical: Lovenox 40mg  daily             -antiplatelet therapy: plan aspirin 81 mg BID at discharge             -05/25/23 BLE venous duplex negative   3. Pain Management: Tylenol 650 mg q 6 hours -Robaxin as needed -oxycodone as needed -12/20- pain 7/10 this AM- asked nursing to give pain meds- pt feels meds work when takes them 4. Mood/Behavior/Sleep: LCSW to evaluate and provide emotional support             -continue Abilify 2 mg q HS              -continue Cymbalta DR 120 mg daily             -continue Remeron 30 mg q HS             -antipsychotic agents: n/a   12/20- no changes to depression meds- is chronic dosing 5. Neuropsych/cognition: This patient is capable of making decisions on his own behalf.   6. Skin/Wound Care:  Routine skin care checks             -monitor surgical incision (has surgical staples)   12/20- has surgical dressing- con't to monitor 7. Fluids/Electrolytes/Nutrition: Routine Is and Os and follow-up chemistries   12/20- labs look better- Na up to 134-  8: BPH: continue Proscar 5 mg q AM and Flomax 0.4 mg q 48 hours   9: Right hip displaced subcapital femoral neck fracture s/p right direct anterior total hip arthroplasty 05/21/2023 by Dr. Magnus Ivan             -WBAT             -follow-up in 2 weeks   10: ABLA/iron deficiency:              -consider oral iron supplementation             -follow-up CBC   11: Hyponatremia: follow-up BMP   12/20- up to 134 12: Leukocytosis: febrile to 100.4 at ~4 am; currently afebrile             -monitor and follow-up CBC with diff             -FV/IS   -12/20- WBC down to 8.4- is afebrile 13. Hypotension 12/20- BP dropped to 93/51 at lowest last night- pt denies sx's of orthostasis, but was in bed- will monitor with therapy- might need TEDs/ and maybe Midodrine? -05/26/23 BPs stable, monitor Vitals:   05/23/23 1639 05/23/23 1944 05/23/23 1947 05/24/23 0508  BP: (!) 104/59 (!) 96/56 (!) 93/51 132/69   05/24/23 1454 05/24/23 1926 05/25/23 0313 05/25/23 1327  BP: 107/61 117/67 (!) 145/65 (!) 141/66   05/25/23 1841 05/26/23 0413  BP: (!) 111/58 139/86    14. Constipation:  -05/25/23 unclear when LBM was, maybe 1-2 days ago, on colace 100mg  BID, senna 8.6mg  daily, and miralax qM/W/F; monitor if no BM this weekend then may need to adjust meds.  -05/26/23 still no BM, will increase miralax to daily including today, monitor, may need sorbitol tomorrow if no BM   LOS: 3  days A FACE TO FACE EVALUATION WAS PERFORMED  [address] 05/26/2023, 11:17 AM

## 2023-05-26 NOTE — Progress Notes (Signed)
Occupational Therapy Session Note  Patient Details  Name: Jeremy [name] MRN: [id number] Date of Birth: Dec 23, 1935  Today's Date: 05/26/2023 OT Individual Time: 9528-4132 OT Individual Time Calculation (min): 70 min    Short Term Goals: Week 1:  OT Short Term Goal 1 (Week 1): Pt will complete sit > stand with supervision using LRAD OT Short Term Goal 2 (Week 1): Pt will complete LB dressing with CGA using AE PRN OT Short Term Goal 3 (Week 1): Pt will demonstrate improved initiation with ADL task with supervision needed for bathing and shirt at sinkside  Skilled Therapeutic Interventions/Progress Updates:    Pt resting in bed upon arrival with wife present. Wife left room at beginning of session. Pain noted per below. Supine>sit EOB with supervision. OT intervention with focus on standing balance, functional amb with RW, discharge planning, activity tolerance, and safety awareness to increase independence with BADLs. All sit<>stand, standing activities, and amb using RW with CGA. Min verbal cues throughout for RW safety. No LOB noted. Standing activities at BITS 5x1 mins with no UE support for task. Standing activity with corn hole game with rest breaks x 3. Amb with RW around nurse station/day room circuit x 2 with rest break. Pt reports wife was assisting him with BADLs prior to this admission. Pt returned to room and remained in w/c for lunch. Belt alarm activated. All needs within reach.   Therapy Documentation Precautions:  Precautions Precautions: Fall Restrictions Weight Bearing Restrictions Per Provider Order: Yes RLE Weight Bearing Per Provider Order: Weight bearing as tolerated   Pain:  Pt c/o 7-8/10 Rt hip pain; rest as appropriate, meds requested, and ice offered (declned). Pt did not increase with standing and/or amb  Therapy/Group: Individual Therapy  Rich Brave 05/26/2023, 12:06 PM

## 2023-05-27 DIAGNOSIS — S72001A Fracture of unspecified part of neck of right femur, initial encounter for closed fracture: Secondary | ICD-10-CM | POA: Diagnosis not present

## 2023-05-27 LAB — BASIC METABOLIC PANEL
Anion gap: 6 (ref 5–15)
BUN: 12 mg/dL (ref 8–23)
CO2: 26 mmol/L (ref 22–32)
Calcium: 8.6 mg/dL — ABNORMAL LOW (ref 8.9–10.3)
Chloride: 102 mmol/L (ref 98–111)
Creatinine, Ser: 0.99 mg/dL (ref 0.61–1.24)
GFR, Estimated: >60 mL/min (ref >60)
Glucose, Bld: 98 mg/dL (ref 70–99)
Potassium: 4.4 mmol/L (ref 3.5–5.1)
Sodium: 134 mmol/L — ABNORMAL LOW (ref 135–145)

## 2023-05-27 LAB — CBC
HCT: 26 % — ABNORMAL LOW (ref 39.0–52.0)
Hemoglobin: 8.7 g/dL — ABNORMAL LOW (ref 13.0–17.0)
MCH: 30.7 pg (ref 26.0–34.0)
MCHC: 33.5 g/dL (ref 30.0–36.0)
MCV: 91.9 fL (ref 80.0–100.0)
Platelets: 290 10*3/uL (ref 150–400)
RBC: 2.83 MIL/uL — ABNORMAL LOW (ref 4.22–5.81)
RDW: 12.2 % (ref 11.5–15.5)
WBC: 8.4 10*3/uL (ref 4.0–10.5)
nRBC: 0 % (ref 0.0–0.2)

## 2023-05-27 NOTE — Progress Notes (Signed)
Occupational Therapy Session Note  Patient Details  Name: Jeremy [name] MRN: [id number] Date of Birth: 1935-07-14  Today's Date: 05/27/2023 OT Individual Time: 1300-1329 OT Individual Time Calculation (min): 29 min    Short Term Goals: Week 1:  OT Short Term Goal 1 (Week 1): Pt will complete sit > stand with supervision using LRAD OT Short Term Goal 2 (Week 1): Pt will complete LB dressing with CGA using AE PRN OT Short Term Goal 3 (Week 1): Pt will demonstrate improved initiation with ADL task with supervision needed for bathing and shirt at sinkside  Skilled Therapeutic Interventions/Progress Updates:     Pt resting in bed upon arrival and agreeable to therapy. OT intervention with focus on bed mobility and functional amb with RW  with emphasis on safety awareness and activity tolerance. Supine>sit EOB with supervision. Amb with RW to ortho gym with CGA. Following rest break, pt amb with RW to elevators before resting again. Amb with RW to [facility] hallway and completed circuit to exit door and back. Educated pt on importance of daily activity and exercise. Encouraged pt on how well he is doing. Educated pt on pain mgmt. Informed pt that pain will continue to improve but it will take a few weeks. Pt returned to room and requested to return to bed. Sit>supine with supervision. Pt remained in bed with all needs within reach. Bed alam arcivated.   Therapy Documentation Precautions:  Precautions Precautions: Fall Restrictions Weight Bearing Restrictions Per Provider Order: No RLE Weight Bearing Per Provider Order: Weight bearing as tolerated General:   Vital Signs: Therapy Vitals Temp: 98 F (36.7 C) Temp Source: Oral Pulse Rate: 76 Resp: 16 BP: 98/62 Patient Position (if appropriate): Lying Oxygen Therapy SpO2: 97 % O2 Device: Room Air Pain: Pain Assessment Pain Scale: 0-10 Pain Score: 8  Pain Type: Surgical pain Pain Location: Hip Pain Orientation: Right Pain Descriptors /  Indicators: Discomfort Pain Onset: On-going Pain Intervention(s): Medication (See eMAR) ADL: ADL Eating: Minimal assistance Where Assessed-Eating: Wheelchair Grooming: Minimal assistance Where Assessed-Grooming: Sitting at sink Upper Body Bathing: Supervision/safety Where Assessed-Upper Body Bathing: Sitting at sink Lower Body Bathing: Minimal assistance Where Assessed-Lower Body Bathing: Sitting at sink, Standing at sink Upper Body Dressing: Minimal assistance Where Assessed-Upper Body Dressing: Sitting at sink Lower Body Dressing: Moderate assistance Where Assessed-Lower Body Dressing: Sitting at sink, Standing at sink Toileting: Moderate assistance Where Assessed-Toileting: Bedside Commode Toilet Transfer: Contact guard Toilet Transfer Method: Proofreader: Bedside commode, Other (comment) (RW) Tub/Shower Transfer: Unable to assess Tub/Shower Transfer Method: Unable to assess Film/video editor: Unable to assess [employer] Transfer Method: Unable to assess Vision   Perception    Praxis   Balance   Exercises:   Other Treatments:     Therapy/Group: Individual Therapy  Rich Brave 05/27/2023, 2:56 PM

## 2023-05-27 NOTE — Progress Notes (Addendum)
Patient ID: Jeremy [name], male   DOB: 03-31-1936, 87 y.o.   MRN: [id number]  0820- SW left message for pt wife to inform on ELOS. SW shared will follow-up with updates after team conference. *SW received a return phone call from pt wife to discuss above. She would like him ot be here as long as needed since she is older and wants to make sure he is able to be as independent as possible. She is aware SW will follow-up tomorrow after team conference.   1418- SW received phone call from pt dtr inquiring about updates. SW reiterated not any updates at this time, and will have updates tomorrow after team conference. Family remains concerned about pt coming home. SW shared unsure on his current progress at this time, and updates tomorrow.    Cecile Sheerer, MSW, LCSW Office: [phone] Cell: [phone] Fax: [phone]

## 2023-05-27 NOTE — Progress Notes (Addendum)
Occupational Therapy Session Note  Patient Details  Name: Jeremy [name] MRN: [id number] Date of Birth: Oct 29, 1935  Today's Date: 05/27/2023 OT Individual Time: 3614-4315 OT Individual Time Calculation (min): 60 min    Short Term Goals: Week 1:  OT Short Term Goal 1 (Week 1): Pt will complete sit > stand with supervision using LRAD OT Short Term Goal 2 (Week 1): Pt will complete LB dressing with CGA using AE PRN OT Short Term Goal 3 (Week 1): Pt will demonstrate improved initiation with ADL task with supervision needed for bathing and shirt at sinkside  Skilled Therapeutic Interventions/Progress Updates:    Pt sitting in w/c upon arrival. Initial focus on changing clothing with sit<>stand from w/c. Pt required assistance donning socks and shoes. Min verbal cues for threading LLE into pants prior to RLE. Sit<>stand and standing balance with CGA. Pt amb with RW to ortho gym. Standing activities at [facility]. Pt completed various tasks standing wihtout BUE support and reaching outside BOS. No LOB noted. Pt verbalizes he doesn't feel he is doing well. Upon questioning, pt uses pain as measure of how well he is improving. All amb and standing activities with CGA. BUE therex with 3# bar for chest presses and curls-3x8 with rest breaks.Pt returned to room and remained seated in w/c. Belt alarm activated. All needs wihtin reach.  Therapy Documentation Precautions:  Precautions Precautions: Fall Restrictions Weight Bearing Restrictions Per Provider Order: Yes RLE Weight Bearing Per Provider Order: Weight bearing as tolerated   Pain:  Pt reports 6/10 Rt hip pain; rest as appropriate and repositioned   Therapy/Group: Individual Therapy  Rich Brave 05/27/2023, 11:56 AM

## 2023-05-27 NOTE — Care Management (Signed)
[facility] Individual Statement of Services  Patient Name:  Jeremy [name]  Date:  05/27/2023  Welcome to the [facility].  Our goal is to provide you with an individualized program based on your diagnosis and situation, designed to meet your specific needs.  With this comprehensive rehabilitation program, you will be expected to participate in at least 3 hours of rehabilitation therapies Monday-Friday, with modified therapy programming on the weekends.  Your rehabilitation program will include the following services:  Physical Therapy (PT), Occupational Therapy (OT), 24 hour per day rehabilitation nursing, Therapeutic Recreaction (TR), Psychology, Neuropsychology, Care Coordinator, Rehabilitation Medicine, Nutrition Services, Pharmacy Services, and Other  Weekly team conferences will be held on Tuesdays to discuss your progress.  Your Inpatient Rehabilitation Care Coordinator will talk with you frequently to get your input and to update you on team discussions.  Team conferences with you and your family in attendance may also be held.  Expected length of stay: 10-12 days    Overall anticipated outcome: Supervision  Depending on your progress and recovery, your program may change. Your Inpatient Rehabilitation Care Coordinator will coordinate services and will keep you informed of any changes. Your Inpatient Rehabilitation Care Coordinator's name and contact numbers are listed  below.  The following services may also be recommended but are not provided by the [facility]:  Driving Evaluations Home Health Rehabiltiation Services Outpatient Rehabilitation Services Vocational Rehabilitation   Arrangements will be made to provide these services after discharge if needed.  Arrangements include referral to agencies that provide these services.  Your insurance has been verified to be:  Medicare A/B  Your primary doctor is:  Irena Reichmann  Pertinent information will be shared with your doctor and your insurance company.  Inpatient Rehabilitation Care Coordinator:  Susie Cassette [phone] or [phone]  Information discussed with and copy given to patient by: Gretchen Short, 05/27/2023, 7:40 AM

## 2023-05-27 NOTE — Progress Notes (Signed)
Physical Therapy Session Note  Patient Details  Name: Jeremy [name] MRN: [id number] Date of Birth: 03-Aug-1935  Today's Date: 05/27/2023 PT Individual Time: 1440-1530 PT Individual Time Calculation (min): 50 min   Short Term Goals: Week 1:  PT Short Term Goal 1 (Week 1): pt will navigate stairs x 2 with CGA PT Short Term Goal 2 (Week 1): Pt will ambulate x 100 ft with LRAD  Skilled Therapeutic Interventions/Progress Updates:    pt received in bed and agreeable to therapy. Pt reports 9/10 pain, initially declines medication, but then requested/received them later in the session.  Bed mobility with supervision. Sit to stand with CGA and RW.   Gait to/ from day room with CGA. Did note shuffling gait pattern, but thought to be pre-morbid. X1 instance of bumping into object on his L side. No formal LOB noted. Cues for shuffling, but pt with minimal awareness of this at this time.   Pt participated in 1 round of cornhole in standing without UE support for balance and reactive balance. Pt able to shift weight and take small steps without LOB, CGA throughout. Pt then returned to room to receive pain medication from nsg.  Pt then participated in nustep at level 3 x 10 min for global strength and R hip ROM. Cues for full ROM and pacing. Pt reports stretching in hip flexion but tolerable.   Pt then returned to room, requesting to end at scheduled time despite therapist late arrival. Pt returned to bed with supervision and was left with all needs in reach and alarm active. Pt missed x 10 min d/t requesting to end at scheduled time vs making up time. Will attempt to make up as able.   Therapy Documentation Precautions:  Precautions Precautions: Fall Restrictions Weight Bearing Restrictions Per Provider Order: No RLE Weight Bearing Per Provider Order: Weight bearing as tolerated General:       Therapy/Group: Individual Therapy  Juluis Rainier 05/27/2023, 3:00 PM

## 2023-05-27 NOTE — Progress Notes (Signed)
PROGRESS NOTE   Subjective/Complaints:   Pt upset about Bed alamr- "not good" and refuses to disuuss any other issues due to bed alarm- educated why needs it - wife voiced understanding, pt said doesn't care, wants to walk on his own- is CGA-min A, so not appropriate on his own.  ROS:   Pt denies SOB, abd pain, CP, N/V/C/D, and vision changes   Negative except for HPI  Objective:   No results found.  Recent Labs    05/27/23 0550  WBC 8.4  HGB 8.7*  HCT 26.0*  PLT 290   Recent Labs    05/27/23 0550  NA 134*  K 4.4  CL 102  CO2 26  GLUCOSE 98  BUN 12  CREATININE 0.99  CALCIUM 8.6*    Intake/Output Summary (Last 24 hours) at 05/27/2023 0910 Last data filed at 05/27/2023 0800 Gross per 24 hour  Intake 951 ml  Output --  Net 951 ml        Physical Exam: Vital Signs Blood pressure 119/67, pulse 75, temperature 98.4 F (36.9 C), resp. rate 16, height 5\' 6"  (1.676 m), weight 55.9 kg, SpO2 98%.      General: awake, alert, frustrated; wife in room;  NAD HENT: conjugate gaze; oropharynx moist CV: regular rate and rhythm; no JVD Pulmonary: CTA B/L; no W/R/R- good air movement GI: soft, NT, ND, (+)BS Psychiatric: grumpy/frustrated about bed alarm this AM- so cannot get up on his own- and perseverative on topic Neurological: alert PRIOR EXAMS: Neurological: Ox3 Musculoskeletal:     Cervical back: Neck supple. No tenderness.     Right lower leg: No edema.     Left lower leg: No edema.     Comments: Ue's 5-/5 B/L RLE- HF 2/5; DF/PF 4+/5 LLE- 5-/5 throughout  Skin:    General: Skin is warm.     Comments: Moist; not hot to touch; surgical dressing dry and intact- no signs of drainage through dressing   Assessment/Plan: 1. Functional deficits which require 3+ hours per day of interdisciplinary therapy in a comprehensive inpatient rehab setting. Physiatrist is providing close team supervision and 24  hour management of active medical problems listed below. Physiatrist and rehab team continue to assess barriers to discharge/monitor patient progress toward functional and medical goals  Care Tool:  Bathing    Body parts bathed by patient: Right arm, Left arm, Chest, Abdomen, Front perineal area, Buttocks, Right upper leg, Left upper leg, Face   Body parts bathed by helper: Right lower leg, Left lower leg     Bathing assist Assist Level: Minimal Assistance - Patient > 75%     Upper Body Dressing/Undressing Upper body dressing   What is the patient wearing?: Pull over shirt, Button up shirt    Upper body assist Assist Level: Minimal Assistance - Patient > 75%    Lower Body Dressing/Undressing Lower body dressing      What is the patient wearing?: Underwear/pull up, Pants     Lower body assist Assist for lower body dressing: Moderate Assistance - Patient 50 - 74%     Toileting Toileting    Toileting assist Assist for toileting: Moderate Assistance - Patient 50 -  74%     Transfers Chair/bed transfer  Transfers assist     Chair/bed transfer assist level: Contact Guard/Touching assist     Locomotion Ambulation   Ambulation assist      Assist level: Contact Guard/Touching assist Assistive device: Walker-rolling Max distance: 20   Walk 10 feet activity   Assist     Assist level: Contact Guard/Touching assist Assistive device: Walker-rolling   Walk 50 feet activity   Assist Walk 50 feet with 2 turns activity did not occur: Safety/medical concerns         Walk 150 feet activity   Assist Walk 150 feet activity did not occur: Safety/medical concerns         Walk 10 feet on uneven surface  activity   Assist Walk 10 feet on uneven surfaces activity did not occur: Safety/medical concerns         Wheelchair     Assist Is the patient using a wheelchair?: Yes Type of Wheelchair: Manual    Wheelchair assist level:  Supervision/Verbal cueing Max wheelchair distance: 10    Wheelchair 50 feet with 2 turns activity    Assist        Assist Level: Dependent - Patient 0%   Wheelchair 150 feet activity     Assist      Assist Level: Dependent - Patient 0%   Blood pressure 119/67, pulse 75, temperature 98.4 F (36.9 C), resp. rate 16, height 5\' 6"  (1.676 m), weight 55.9 kg, SpO2 98%.    Medical Problem List and Plan: 1. Functional deficits secondary to R hip fx s/p THA WBAT             -patient may  shower- cover incision/dressing             -ELOS/Goals: 14-18 days supervision Con't CIR PT and OT Pt upset about alarm and inability to get up by himself, however he's not ready to get up by himself- he's CGA-min A with verbal cues- so not appropriate and at high risk for another fall- d/w pt- he's still upset about it 2.  Antithrombotics: -DVT/anticoagulation:  Pharmaceutical: Lovenox 40mg  daily             -antiplatelet therapy: plan aspirin 81 mg BID at discharge             -05/25/23 BLE venous duplex negative   3. Pain Management: Tylenol 650 mg q 6 hours -Robaxin as needed -oxycodone as needed -12/20- pain 7/10 this AM- asked nursing to give pain meds- pt feels meds work when takes them 12/23- pain "OK"- con't regimen 4. Mood/Behavior/Sleep: LCSW to evaluate and provide emotional support             -continue Abilify 2 mg q HS             -continue Cymbalta DR 120 mg daily             -continue Remeron 30 mg q HS             -antipsychotic agents: n/a   12/20- no changes to depression meds- is chronic dosing 5. Neuropsych/cognition: This patient is capable of making decisions on his own behalf.   6. Skin/Wound Care: Routine skin care checks             -monitor surgical incision (has surgical staples)   12/20- has surgical dressing- con't to monitor 7. Fluids/Electrolytes/Nutrition: Routine Is and Os and follow-up chemistries   12/20- labs look better- Na up  to 134-  8: BPH:  continue Proscar 5 mg q AM and Flomax 0.4 mg q 48 hours   12/23- voiding well per pt- intermittent incontinence-  9: Right hip displaced subcapital femoral neck fracture s/p right direct anterior total hip arthroplasty 05/21/2023 by Dr. Magnus Ivan             -WBAT             -follow-up in 2 weeks   10: ABLA/iron deficiency:              -consider oral iron supplementation             -follow-up CBC   12/23- Hb down to 8.7 from 9.3-9/6 after surgery since still dropping, will recheck Thursday 11: Hyponatremia: follow-up BMP   12/20- up to 134 12: Leukocytosis: febrile to 100.4 at ~4 am; currently afebrile             -monitor and follow-up CBC with diff             -FV/IS   -12/20- WBC down to 8.4- is afebrile 13. Hypotension 12/20- BP dropped to 93/51 at lowest last night- pt denies sx's of orthostasis, but was in bed- will monitor with therapy- might need TEDs/ and maybe Midodrine? -05/26/23 BPs stable, monitor 12/23- BP controlled- soft, but per nursing and therapy, no orthostasis  Vitals:   05/23/23 1944 05/23/23 1947 05/24/23 0508 05/24/23 1454  BP: (!) 96/56 (!) 93/51 132/69 107/61   05/24/23 1926 05/25/23 0313 05/25/23 1327 05/25/23 1841  BP: 117/67 (!) 145/65 (!) 141/66 (!) 111/58   05/26/23 0413 05/26/23 1316 05/26/23 1837 05/27/23 0409  BP: 139/86 136/67 106/61 119/67    14. Constipation:  -05/25/23 unclear when LBM was, maybe 1-2 days ago, on colace 100mg  BID, senna 8.6mg  daily, and miralax qM/W/F; monitor if no BM this weekend then may need to adjust meds.  -05/26/23 still no BM, will increase miralax to daily including today, monitor, may need sorbitol tomorrow if no BM  12/23- 3 small BM's in last 24 hours- con't miralax  LOS: 4 days A FACE TO FACE EVALUATION WAS PERFORMED  Willmer Fellers 05/27/2023, 9:10 AM

## 2023-05-27 NOTE — Progress Notes (Signed)
Physical Therapy Session Note  Patient Details  Name: Jeremy [name] MRN: [id number] Date of Birth: [birth year]  Today's Date: 05/27/2023 PT Individual Time: 0800-0845 PT Individual Time Calculation (min): 45 min   Short Term Goals: Week 1:  PT Short Term Goal 1 (Week 1): pt will navigate stairs x 2 with CGA PT Short Term Goal 2 (Week 1): Pt will ambulate x 100 ft with LRAD  Skilled Therapeutic Interventions/Progress Updates:    Pt greeted in w/c with his wife present. Reports feeling "unwell" and with 8/10 hip pain, but agreeable to participate. Nsg provided medication during session.   Pt propelled w/c with BUE to main gym with supervision for endurance and functional mobility. Pt reports some difficulty with his hands, R>L,but was able to propel successfully without assist.  Stair progression beginning with step taps to facilitate hip ROM and single leg stance stability. Progressed to 6" stair navigation 2 x 4 with BHR. Pt self selects alternating ascending and step to descendign, progressed to step to descending on 2nd set. Pt was safe and able to perform in this manner with incr time and BHR. Discussed using step to pattern for pain management and energy conservation when needed.   At this time, pt presents with several bouts of dry heaving and reporting "heaviness in the stomach." Pt provided with ginger ale and crackers on return to room. Pt remained sitting up in w/c, nsg made aware of condition, was left with all needs in reach and alarm active.   Therapy Documentation Precautions:  Precautions Precautions: Fall Restrictions Weight Bearing Restrictions Per Provider Order: Yes RLE Weight Bearing Per Provider Order: Weight bearing as tolerated    Therapy/Group: Individual Therapy  Juluis Rainier 05/27/2023, 8:37 AM

## 2023-05-28 DIAGNOSIS — S72001A Fracture of unspecified part of neck of right femur, initial encounter for closed fracture: Secondary | ICD-10-CM | POA: Diagnosis not present

## 2023-05-28 MED ORDER — OXYCODONE HCL 5 MG PO TABS
10.0000 mg | ORAL_TABLET | Freq: Two times a day (BID) | ORAL | Status: DC
  Administered 2023-05-28 – 2023-06-03 (×12): 10 mg via ORAL
  Filled 2023-05-28 (×12): qty 2

## 2023-05-28 NOTE — Plan of Care (Signed)
  Problem: RH Bathing Goal: LTG Patient will bathe all body parts with assist levels (OT) Description: LTG: Patient will bathe all body parts with assist levels (OT) Flowsheets (Taken 05/28/2023 1549) LTG: Pt will perform bathing with assistance level/cueing: Minimal Assistance - Patient > 75% Note: Goal downgraded 12/24 due to current patient functioning.    Problem: RH Dressing Goal: LTG Patient will perform lower body dressing w/assist (OT) Description: LTG: Patient will perform lower body dressing with assist, with/without cues in positioning using equipment (OT) Flowsheets (Taken 05/28/2023 1549) LTG: Pt will perform lower body dressing with assistance level of: Minimal Assistance - Patient > 75% Note: Goal downgraded 12/24 due to current patient functioning.    Problem: RH Tub/Shower Transfers Goal: LTG Patient will perform tub/shower transfers w/assist (OT) Description: LTG: Patient will perform tub/shower transfers with assist, with/without cues using equipment (OT) Outcome: Not Applicable Note: Goal no longer applicable due to patient preference to sponge-bathe at discharge.

## 2023-05-28 NOTE — Progress Notes (Signed)
Occupational Therapy Session Note  Patient Details  Name: Jeremy [name] MRN: [id number] Date of Birth: [birth year]  Today's Date: 05/28/2023 OT Individual Time: 7829-5621 OT Individual Time Calculation (min): 55 min    Short Term Goals: Week 2:  OT Short Term Goal 1 (Week 2): STG=LTG 2/2 ELOS  Skilled Therapeutic Interventions/Progress Updates:    OT intervention with focus on bed mobility, sit<>stand, standing balance, BUE therex, functional amb with RW, toileting, and safety awareness to increase independence with BADLs. All amb with CGA. Pt amb from room to gym for BUE therex-circuit with 2# bar, 4# bar, and body weight reaching out and up for general conditioning and strengthening-3x10. Amb with RW to elevators and return to gym with rest break x 1. Amb with RW to restroom in 4W. Toileting with min A/CGA. Pt returned to gym and rested before returning to room. Pt requested to return to bed. Sit>supine with supervision. Pt encouraged throughout session to continue with amb and exercise. Pt remained in bed. Bed alarm activated. All needs within reach.  Therapy Documentation Precautions:  Precautions Precautions: Fall Restrictions Weight Bearing Restrictions Per Provider Order: Yes RLE Weight Bearing Per Provider Order: Weight bearing as tolerated   Pain: Pt c/o 7/10 Rt hip pain-less at rest; rest as appropriate and meds admin prior to therapy  Therapy/Group: Individual Therapy  Rich Brave 05/28/2023, 10:28 AM

## 2023-05-28 NOTE — Progress Notes (Signed)
Patient ID: Jeremy [name], male   DOB: 05-20-1936, 87 y.o.   MRN: [id number]  SW went by pt room and pt not preset. SW to follow- up with pt wife.   90- SW spoke with pt wife to provide updates from team conference, and inform on d/c date 12/30. Fam edu scheduled for Friday (12/27) 8am-10am. SW discussed discharge process. SW discussed outpatient. Referral will be sent to [facility] for PT/OT.  Cecile Sheerer, MSW, LCSW Office: [phone] Cell: [phone] Fax: [phone]

## 2023-05-28 NOTE — Progress Notes (Signed)
Occupational Therapy Session Note  Patient Details  Name: Jeremy [name] MRN: [id number] Date of Birth: Jun 08, 1935  Today's Date: 05/28/2023 OT Individual Time: 5784-6962 OT Individual Time Calculation (min): 42 min   Short Term Goals: Week 1:  OT Short Term Goal 1 (Week 1): Pt will complete sit > stand with supervision using LRAD OT Short Term Goal 2 (Week 1): Pt will complete LB dressing with CGA using AE PRN OT Short Term Goal 3 (Week 1): Pt will demonstrate improved initiation with ADL task with supervision needed for bathing and shirt at sinkside  Skilled Therapeutic Interventions/Progress Updates:  Pt greeted resting in bed  for skilled OT session with focus on standing tolerance/balance.    Pain: Pt reported 7/10 pain at fracture, OT offering intermediate rest breaks and positioning suggestions throughout session to address pain/fatigue and maximize participation/safety in session.   Functional Transfers: Bed mobility with supervision, stand-step from EOB<>WC with CGA + RW.   Therapeutic Activities: In main therapy gym, at high-low table, pt participates in table-top activity targeting standing balance as patient completes x2 reps of activity with no UE support. Second rep performed while standing on Airex Foam, CGA for stepping onto foam, CGA fading to close supervision for standing balance during both trials of activity, each rep lasting ~2-3 mins.   Pt completes 2x20 toe taps onto Airex foam with CGA + no AD. Cuing provided for BLE clearance and wide base of support with stepping back to avoid tripping.  Pt propels WC with BLE back towards room, ~20 ft, for general conditioning.    Education: Pt shares he has had x2 falls attempting to thread legs into pajama pants. Pt educated on seated threading to prevent LOB.  Pt remained resting in the bed with 4Ps assessed and immediate needs met. Ice applied to fracture site for pain management. Pt continues to be appropriate for skilled OT  intervention to promote further functional independence in ADLs/IADLs.   Therapy Documentation Precautions:  Precautions Precautions: Fall Restrictions Weight Bearing Restrictions Per Provider Order: Yes RLE Weight Bearing Per Provider Order: Weight bearing as tolerated  Therapy/Group: Individual Therapy  Lou Cal, OTR/L, MSOT  05/28/2023, 6:12 AM

## 2023-05-28 NOTE — Progress Notes (Signed)
Occupational Therapy Weekly Progress Note  Patient Details  Name: Jeremy [name] MRN: [id number] Date of Birth: Jan 24, 1936  Beginning of progress report period: May 24, 2023 End of progress report period: May 28, 2023  Patient has met 2 of 3 short term goals.  Pt made slow but steady progress with BADLs and functional transfers since admission. Pt requires encouragement to participate in BADLs but is agreeable to all therapies offered. Sit<>stand with close supervision and min verbal cues for hand placement. Functional transfers with CGA. Pt requires assistance with donning socks/shoes but completes other LB dressing tasks with CGA. Min A for UB dressing tasks. Bathing with min/mod A.   Patient continues to demonstrate the following deficits: muscle weakness, decreased cardiorespiratoy endurance, decreased memory and delayed processing, and decreased standing balance and decreased balance strategies and therefore will continue to benefit from skilled OT intervention to enhance overall performance with BADL and Reduce care partner burden.  Patient not progressing toward long term goals.  See goal revision..  Plan of care revisions: 12/24.  OT Short Term Goals Week 1:  OT Short Term Goal 1 (Week 1): Pt will complete sit > stand with supervision using LRAD OT Short Term Goal 1 - Progress (Week 1): Met OT Short Term Goal 2 (Week 1): Pt will complete LB dressing with CGA using AE PRN OT Short Term Goal 2 - Progress (Week 1): Progressing toward goal OT Short Term Goal 3 (Week 1): Pt will demonstrate improved initiation with ADL task with supervision needed for bathing and shirt at sinkside OT Short Term Goal 3 - Progress (Week 1): Met Week 2:  OT Short Term Goal 1 (Week 2): STG=LTG 2/2 ELOS  Rich Brave 05/28/2023, 6:41 AM   Lou Cal, OTR/L, MSOT

## 2023-05-28 NOTE — Plan of Care (Signed)
  Problem: RH SAFETY Goal: RH STG DECREASED RISK OF FALL WITH ASSISTANCE Description: STG Decreased Risk of Fall With Mod I Assistance. Outcome: Progressing   Problem: RH PAIN MANAGEMENT Goal: RH STG PAIN MANAGED AT OR BELOW PT'S PAIN GOAL Description: < 4 on a 0-10 pain scale, treat with prns. Outcome: Progressing   Problem: RH KNOWLEDGE DEFICIT GENERAL Goal: RH STG INCREASE KNOWLEDGE OF SELF CARE AFTER HOSPITALIZATION Description: Patient will demonstrate knowledge of pain management, skin/wound care management, safety awareness with educational materials and handouts provided by staff during rehabilitation admission.. Outcome: Progressing

## 2023-05-28 NOTE — Progress Notes (Signed)
Physical Therapy Session Note  Patient Details  Name: Jeremy [name] MRN: [id number] Date of Birth: Oct 03, 1935  Today's Date: 05/28/2023 PT Individual Time: 0800-0900,1300-1415 PT Individual Time Calculation (min): 60 min, 75 min   Short Term Goals: Week 1:  PT Short Term Goal 1 (Week 1): pt will navigate stairs x 2 with CGA PT Short Term Goal 2 (Week 1): Pt will ambulate x 100 ft with LRAD  Skilled Therapeutic Interventions/Progress Updates:   Pt seated in w/c on arrival and agreeable to therapy. Pt reports 7/10 pain, nsg provided pain medication at start of session. Provided education on benefit of premedicating for therapy.    Gait throughout session with RW and CGA to close supervision at times. Continue to note shuffling gait pattern, but no toe catch resulting in stumble in controlled environment.  Standing step taps with no UE support, pt intermittently placing arm against RW for support. No LOB. Provided with trekking pole for RLE support, improving balance and confidence with repetition. Gait with trekking pole x 100 ft for balance challenge and dynamic gait. During rest break, provided education on anatomy with diagrams of hip and femoral neck fractures.  Pt then participated in retro gait training with RW 2  x 30 ft, Cues for incr step length and min a for balance. Pt returned to room and to bed with supervision, was left with all needs in reach and alarm active.   Session 2: pt received in bed and agreeable to therapy. Pt reports pain "doing ok right now." Rest and positioning as needed for comfort.   Pt ambulated with RW and CGA-close supervision, same assist and gait pattern as above.  Session focused on stair training, first with R hand rail x 4 and the 2 x 8 using vertical bar to simulate post at home. Education on leading with the good to ascend and with the "bad" to descend, pt able to recall and perform with incr time to think about which foot to lead with.   Pt then  participated in curb training on 8" step x 3, requiring CGA and instructional cues for safety. Pt able to perform with minimal cues by last rep.  After this, pt reports stabbing pain in hip with gait, so utilized nustep for global strength and endurance, and R hip ROM. Level 4 x 11 min, level 6 x 4 min for incr strength training.   Returned to room and to bed with supervision, then participated in bed level therex as follows: -bridging 3 x 10, with 3 sec hold for glute strength. -supine marching 2 x 20 -small range clam shells, 2 x 10  Pt remained in bed at end of session, was left with all needs in reach and alarm active.    Therapy Documentation Precautions:  Precautions Precautions: Fall Restrictions Weight Bearing Restrictions Per Provider Order: Yes RLE Weight Bearing Per Provider Order: Weight bearing as tolerated General:       Therapy/Group: Individual Therapy  Juluis Rainier 05/28/2023, 8:37 AM

## 2023-05-28 NOTE — Progress Notes (Signed)
PROGRESS NOTE   Subjective/Complaints:   Pt reports sharp pain in R hip last night (per wife- pt doesn't remember this occurring)-  has resolved now, actually shortly afterwards finished therapy 15 minutes early, but per PT, pt isn't asking for pain meds before therapies, which is interfering with therapy.   3 Bms yesterday- wants to decrease bowel meds.  Too much per pt- will sto Colace- con't Miralax.      ROS:    Pt denies SOB, abd pain, CP, N/V/C/D, and vision changes  Negative except for HPI  Objective:   No results found.  Recent Labs    05/27/23 0550  WBC 8.4  HGB 8.7*  HCT 26.0*  PLT 290   Recent Labs    05/27/23 0550  NA 134*  K 4.4  CL 102  CO2 26  GLUCOSE 98  BUN 12  CREATININE 0.99  CALCIUM 8.6*    Intake/Output Summary (Last 24 hours) at 05/28/2023 0916 Last data filed at 05/28/2023 0730 Gross per 24 hour  Intake 598 ml  Output 200 ml  Net 398 ml        Physical Exam: Vital Signs Blood pressure (!) 118/58, pulse 80, temperature 98.2 F (36.8 C), temperature source Oral, resp. rate 18, height 5\' 6"  (1.676 m), weight 55.9 kg, SpO2 100%.      General: awake, alert, sitting up in w/c- wife at bedside;  NAD HENT: conjugate gaze; oropharynx moist CV: regular rate and rhythm; no JVD Pulmonary: CTA B/L; no W/R/R- good air movement GI: soft, NT, ND, (+)BS normoactive Psychiatric: pt irritable and frustrated Neurological: doesn't remember pain yesterday that stopped therapies? PRIOR EXAMS: Neurological: Ox3 Musculoskeletal:     Cervical back: Neck supple. No tenderness.     Right lower leg: No edema.     Left lower leg: No edema.     Comments: Ue's 5-/5 B/L RLE- HF 2/5; DF/PF 4+/5 LLE- 5-/5 throughout  Skin:    General: Skin is warm.     Comments: Moist; not hot to touch; surgical dressing dry and intact- no signs of drainage through dressing   Assessment/Plan: 1.  Functional deficits which require 3+ hours per day of interdisciplinary therapy in a comprehensive inpatient rehab setting. Physiatrist is providing close team supervision and 24 hour management of active medical problems listed below. Physiatrist and rehab team continue to assess barriers to discharge/monitor patient progress toward functional and medical goals  Care Tool:  Bathing    Body parts bathed by patient: Right arm, Left arm, Chest, Abdomen, Front perineal area, Buttocks, Right upper leg, Left upper leg, Face   Body parts bathed by helper: Right lower leg, Left lower leg     Bathing assist Assist Level: Minimal Assistance - Patient > 75%     Upper Body Dressing/Undressing Upper body dressing   What is the patient wearing?: Pull over shirt    Upper body assist Assist Level: Minimal Assistance - Patient > 75%    Lower Body Dressing/Undressing Lower body dressing      What is the patient wearing?: Underwear/pull up, Pants     Lower body assist Assist for lower body dressing: Minimal Assistance - Patient >  75%     Toileting Toileting    Toileting assist Assist for toileting: Moderate Assistance - Patient 50 - 74%     Transfers Chair/bed transfer  Transfers assist     Chair/bed transfer assist level: Contact Guard/Touching assist     Locomotion Ambulation   Ambulation assist      Assist level: Contact Guard/Touching assist Assistive device: Walker-rolling Max distance: 20   Walk 10 feet activity   Assist     Assist level: Contact Guard/Touching assist Assistive device: Walker-rolling   Walk 50 feet activity   Assist Walk 50 feet with 2 turns activity did not occur: Safety/medical concerns         Walk 150 feet activity   Assist Walk 150 feet activity did not occur: Safety/medical concerns         Walk 10 feet on uneven surface  activity   Assist Walk 10 feet on uneven surfaces activity did not occur: Safety/medical  concerns         Wheelchair     Assist Is the patient using a wheelchair?: Yes Type of Wheelchair: Manual    Wheelchair assist level: Supervision/Verbal cueing Max wheelchair distance: 10    Wheelchair 50 feet with 2 turns activity    Assist        Assist Level: Total Assistance - Patient < 25% (Patient propelled 10 ft (20%))   Wheelchair 150 feet activity     Assist      Assist Level: Total Assistance - Patient < 25% (Patient propelled 17ft)   Blood pressure (!) 118/58, pulse 80, temperature 98.2 F (36.8 C), temperature source Oral, resp. rate 18, height 5\' 6"  (1.676 m), weight 55.9 kg, SpO2 100%.    Medical Problem List and Plan: 1. Functional deficits secondary to R hip fx s/p THA WBAT             -patient may  shower- cover incision/dressing             -ELOS/Goals: 14-18 days supervision  cON'T cir pt AND ot  Team conference today to determine length of stay  Will add Scheduled pain meds for therapy since it's the most limiting issues Pt upset about alarm and inability to get up by himself, however he's not ready to get up by himself- he's CGA-min A with verbal cues- so not appropriate and at high risk for another fall- d/w pt- he's still upset about it 2.  Antithrombotics: -DVT/anticoagulation:  Pharmaceutical: Lovenox 40mg  daily             -antiplatelet therapy: plan aspirin 81 mg BID at discharge             -05/25/23 BLE venous duplex negative   3. Pain Management: Tylenol 650 mg q 6 hours -Robaxin as needed -oxycodone as needed -12/20- pain 7/10 this AM- asked nursing to give pain meds- pt feels meds work when takes them 12/23- pain "OK"- con't regimen 12/24- pt main limiter with therapy per PT- will schedule Oxy 10 mg with 6:30 am and noon to make sure before AM and PM therapies.  4. Mood/Behavior/Sleep: LCSW to evaluate and provide emotional support             -continue Abilify 2 mg q HS             -continue Cymbalta DR 120 mg daily              -continue Remeron 30 mg q HS             -  antipsychotic agents: n/a   12/20- no changes to depression meds- is chronic dosing 5. Neuropsych/cognition: This patient is capable of making decisions on his own behalf.   6. Skin/Wound Care: Routine skin care checks             -monitor surgical incision (has surgical staples)   12/20- has surgical dressing- con't to monitor  12/24- no drainage 7. Fluids/Electrolytes/Nutrition: Routine Is and Os and follow-up chemistries   12/20- labs look better- Na up to 134-  8: BPH: continue Proscar 5 mg q AM and Flomax 0.4 mg q 48 hours   12/23- voiding well per pt- intermittent incontinence-  9: Right hip displaced subcapital femoral neck fracture s/p right direct anterior total hip arthroplasty 05/21/2023 by Dr. Magnus Ivan             -WBAT             -follow-up in 2 weeks   10: ABLA/iron deficiency:              -consider oral iron supplementation             -follow-up CBC   12/23- Hb down to 8.7 from 9.3-9/6 after surgery since still dropping, will recheck Thursday 11: Hyponatremia: follow-up BMP   12/20- up to 134 12: Leukocytosis: febrile to 100.4 at ~4 am; currently afebrile             -monitor and follow-up CBC with diff             -FV/IS   -12/20- WBC down to 8.4- is afebrile 13. Hypotension 12/20- BP dropped to 93/51 at lowest last night- pt denies sx's of orthostasis, but was in bed- will monitor with therapy- might need TEDs/ and maybe Midodrine? -05/26/23 BPs stable, monitor 12/23- BP controlled- soft, but per nursing and therapy, no orthostasis  12/24- looking better- con't regimen Vitals:   05/24/23 1454 05/24/23 1926 05/25/23 0313 05/25/23 1327  BP: 107/61 117/67 (!) 145/65 (!) 141/66   05/25/23 1841 05/26/23 0413 05/26/23 1316 05/26/23 1837  BP: (!) 111/58 139/86 136/67 106/61   05/27/23 0409 05/27/23 1400 05/27/23 1920 05/28/23 0445  BP: 119/67 98/62 122/64 (!) 118/58    14. Constipation:  -05/25/23 unclear when  LBM was, maybe 1-2 days ago, on colace 100mg  BID, senna 8.6mg  daily, and miralax qM/W/F; monitor if no BM this weekend then may need to adjust meds.  -05/26/23 still no BM, will increase miralax to daily including today, monitor, may need sorbitol tomorrow if no BM  12/23- 3 small BM's in last 24 hours- con't miralax  12/24- 3 BM's yesterday- too loose- will stop Colace and con't Miralax. Esp since going to be taking pain meds scheduled.    I spent a total of 38   minutes on total care today- >50% coordination of care- due to  D/w pt and wife about pain, bowels- and BP; also d/w PT about pain limiting therapy and team conference to determine length fo stay- wife wants longer than shorter  LOS: 5 days A FACE TO FACE EVALUATION WAS PERFORMED  Jeremy [name] 05/28/2023, 9:16 AM

## 2023-05-29 DIAGNOSIS — S72001A Fracture of unspecified part of neck of right femur, initial encounter for closed fracture: Secondary | ICD-10-CM | POA: Diagnosis not present

## 2023-05-29 NOTE — Progress Notes (Signed)
PROGRESS NOTE   Subjective/Complaints: No new complaints this morning Wife would like to take him outside of room today for Christmas, grounds pass ordered No issues overnight    ROS:    Pt denies SOB, abd pain, CP, N/V/C/D, and vision changes  Negative except for HPI  Objective:   No results found.  Recent Labs    05/27/23 0550  WBC 8.4  HGB 8.7*  HCT 26.0*  PLT 290   Recent Labs    05/27/23 0550  NA 134*  K 4.4  CL 102  CO2 26  GLUCOSE 98  BUN 12  CREATININE 0.99  CALCIUM 8.6*    Intake/Output Summary (Last 24 hours) at 05/29/2023 1014 Last data filed at 05/29/2023 0900 Gross per 24 hour  Intake 477 ml  Output --  Net 477 ml        Physical Exam: Vital Signs Blood pressure (!) 103/48, pulse 70, temperature (!) 97.5 F (36.4 C), temperature source Oral, resp. rate 17, height 5\' 6"  (1.676 m), weight 55.9 kg, SpO2 97%.      General: awake, alert, sitting up in w/c- wife at bedside;  NAD HENT: conjugate gaze; oropharynx moist CV: regular rate and rhythm; no JVD Pulmonary: CTA B/L; no W/R/R- good air movement GI: soft, NT, ND, (+)BS normoactive Psychiatric: pt irritable and frustrated Neurological: doesn't remember pain yesterday that stopped therapies? Neurological: Ox3 Musculoskeletal:     Cervical back: Neck supple. No tenderness.     Right lower leg: No edema.     Left lower leg: No edema.     Comments: Ue's 5-/5 B/L RLE- HF 2/5; DF/PF 4+/5 LLE- 5-/5 throughout  Skin:    General: Skin is warm.     Comments: Moist; not hot to touch; surgical dressing dry and intact- no signs of drainage through dressing, exam stable 12/25  Assessment/Plan: 1. Functional deficits which require 3+ hours per day of interdisciplinary therapy in a comprehensive inpatient rehab setting. Physiatrist is providing close team supervision and 24 hour management of active medical problems listed  below. Physiatrist and rehab team continue to assess barriers to discharge/monitor patient progress toward functional and medical goals  Care Tool:  Bathing    Body parts bathed by patient: Right arm, Left arm, Chest, Abdomen, Front perineal area, Buttocks, Right upper leg, Left upper leg, Face   Body parts bathed by helper: Right lower leg, Left lower leg     Bathing assist Assist Level: Minimal Assistance - Patient > 75%     Upper Body Dressing/Undressing Upper body dressing   What is the patient wearing?: Pull over shirt    Upper body assist Assist Level: Minimal Assistance - Patient > 75%    Lower Body Dressing/Undressing Lower body dressing      What is the patient wearing?: Underwear/pull up, Pants     Lower body assist Assist for lower body dressing: Minimal Assistance - Patient > 75%     Toileting Toileting    Toileting assist Assist for toileting: Moderate Assistance - Patient 50 - 74%     Transfers Chair/bed transfer  Transfers assist     Chair/bed transfer assist level: Contact Guard/Touching assist  Locomotion Ambulation   Ambulation assist      Assist level: Contact Guard/Touching assist Assistive device: Walker-rolling Max distance: 20   Walk 10 feet activity   Assist     Assist level: Contact Guard/Touching assist Assistive device: Walker-rolling   Walk 50 feet activity   Assist Walk 50 feet with 2 turns activity did not occur: Safety/medical concerns         Walk 150 feet activity   Assist Walk 150 feet activity did not occur: Safety/medical concerns         Walk 10 feet on uneven surface  activity   Assist Walk 10 feet on uneven surfaces activity did not occur: Safety/medical concerns         Wheelchair     Assist Is the patient using a wheelchair?: Yes Type of Wheelchair: Manual    Wheelchair assist level: Supervision/Verbal cueing Max wheelchair distance: 10    Wheelchair 50 feet with 2  turns activity    Assist        Assist Level: Total Assistance - Patient < 25% (Patient propelled 10 ft (20%))   Wheelchair 150 feet activity     Assist      Assist Level: Total Assistance - Patient < 25% (Patient propelled 66ft)   Blood pressure (!) 103/48, pulse 70, temperature (!) 97.5 F (36.4 C), temperature source Oral, resp. rate 17, height 5\' 6"  (1.676 m), weight 55.9 kg, SpO2 97%.    Medical Problem List and Plan: 1. Functional deficits secondary to R hip fx s/p THA WBAT             -patient may  shower- cover incision/dressing             -ELOS/Goals: 14-18 days supervision  cON'T cir pt AND ot  Grounds pass ordered  Will add Scheduled pain meds for therapy since it's the most limiting issues Pt upset about alarm and inability to get up by himself, however he's not ready to get up by himself- he's CGA-min A with verbal cues- so not appropriate and at high risk for another fall- d/w pt- he's still upset about it 2.  Antithrombotics: -DVT/anticoagulation:  Pharmaceutical: continue Lovenox 40mg  daily             -antiplatelet therapy: plan aspirin 81 mg BID at discharge             -05/25/23 BLE venous duplex negative   3. Pain Management: Tylenol 650 mg q 6 hours -Robaxin as needed -oxycodone as needed -12/20- pain 7/10 this AM- asked nursing to give pain meds- pt feels meds work when takes them 12/23- pain "OK"- con't regimen 12/24- pt main limiter with therapy per PT- will schedule Oxy 10 mg with 6:30 am and noon to make sure before AM and PM therapies.  4. Mood/Behavior/Sleep: LCSW to evaluate and provide emotional support             -continue Abilify 2 mg q HS             -continue Cymbalta DR 120 mg daily             -continue Remeron 30 mg q HS             -antipsychotic agents: n/a   12/20- no changes to depression meds- is chronic dosing 5. Neuropsych/cognition: This patient is capable of making decisions on his own behalf.   6. Skin/Wound Care:  Routine skin care checks             -  monitor surgical incision (has surgical staples)   12/20- has surgical dressing- con't to monitor  12/24- no drainage 7. Fluids/Electrolytes/Nutrition: Routine Is and Os and follow-up chemistries   12/20- labs look better- Na up to 134-  8: BPH: continue Proscar 5 mg q AM and Flomax 0.4 mg q 48 hours   12/23- voiding well per pt- intermittent incontinence-  9: Right hip displaced subcapital femoral neck fracture s/p right direct anterior total hip arthroplasty 05/21/2023 by Dr. Magnus Ivan             -WBAT             -follow-up in 2 weeks   10: ABLA/iron deficiency:              -consider oral iron supplementation             -follow-up CBC   12/23- Hb down to 8.7 from 9.3-9/6 after surgery since still dropping, will recheck Thursday 11: Hyponatremia: follow-up BMP   12/20- up to 134 12: Leukocytosis: febrile to 100.4 at ~4 am; currently afebrile             -monitor and follow-up CBC with diff             -FV/IS   -12/20- WBC down to 8.4- is afebrile 13. Hypotension D/c proscar Vitals:   05/25/23 1327 05/25/23 1841 05/26/23 0413 05/26/23 1316  BP: (!) 141/66 (!) 111/58 139/86 136/67   05/26/23 1837 05/27/23 0409 05/27/23 1400 05/27/23 1920  BP: 106/61 119/67 98/62 122/64   05/28/23 0445 05/28/23 1420 05/28/23 1954 05/29/23 0413  BP: (!) 118/58 (!) 107/57 (!) 101/59 (!) 103/48    14. Constipation:  -05/25/23 unclear when LBM was, maybe 1-2 days ago, on colace 100mg  BID, senna 8.6mg  daily, and miralax qM/W/F; monitor if no BM this weekend then may need to adjust meds.  -05/26/23 still no BM, will increase miralax to daily including today, monitor, may need sorbitol tomorrow if no BM  12/23- 3 small BM's in last 24 hours- con't miralax  12/24- 3 BM's yesterday- too loose- will stop Colace and con't Miralax. Esp since going to be taking pain meds scheduled.   12/25: last BM 12/24, will d/c miralax   LOS: 6 days A FACE TO FACE EVALUATION WAS  PERFORMED  Kellis Mcadam P Coco Sharpnack 05/29/2023, 10:14 AM

## 2023-05-29 NOTE — Progress Notes (Signed)
Patient ID: Jeremy [name], male   DOB: 1936/03/29, 87 y.o.   MRN: [id number]  SW faxed outpatient PT/OT referral to [facility] (p:[phone]/[phone]).  Cecile Sheerer, MSW, LCSW Office: [phone] Cell: [phone] Fax: [phone]

## 2023-05-30 DIAGNOSIS — D62 Acute posthemorrhagic anemia: Secondary | ICD-10-CM

## 2023-05-30 DIAGNOSIS — S7291XS Unspecified fracture of right femur, sequela: Secondary | ICD-10-CM

## 2023-05-30 DIAGNOSIS — G8918 Other acute postprocedural pain: Secondary | ICD-10-CM | POA: Diagnosis not present

## 2023-05-30 DIAGNOSIS — Z96641 Presence of right artificial hip joint: Secondary | ICD-10-CM

## 2023-05-30 LAB — CBC WITH DIFFERENTIAL/PLATELET
Abs Immature Granulocytes: 0.25 10*3/uL — ABNORMAL HIGH (ref 0.00–0.07)
Basophils Absolute: 0.1 10*3/uL (ref 0.0–0.1)
Basophils Relative: 1 %
Eosinophils Absolute: 0.5 10*3/uL (ref 0.0–0.5)
Eosinophils Relative: 7 %
HCT: 25.5 % — ABNORMAL LOW (ref 39.0–52.0)
Hemoglobin: 8.4 g/dL — ABNORMAL LOW (ref 13.0–17.0)
Immature Granulocytes: 3 %
Lymphocytes Relative: 31 %
Lymphs Abs: 2.3 10*3/uL (ref 0.7–4.0)
MCH: 30.7 pg (ref 26.0–34.0)
MCHC: 32.9 g/dL (ref 30.0–36.0)
MCV: 93.1 fL (ref 80.0–100.0)
Monocytes Absolute: 0.8 10*3/uL (ref 0.1–1.0)
Monocytes Relative: 10 %
Neutro Abs: 3.6 10*3/uL (ref 1.7–7.7)
Neutrophils Relative %: 48 %
Platelets: 439 10*3/uL — ABNORMAL HIGH (ref 150–400)
RBC: 2.74 MIL/uL — ABNORMAL LOW (ref 4.22–5.81)
RDW: 13.1 % (ref 11.5–15.5)
WBC: 7.5 10*3/uL (ref 4.0–10.5)
nRBC: 0 % (ref 0.0–0.2)

## 2023-05-30 MED ORDER — FE FUM-VIT C-VIT B12-FA 460-60-0.01-1 MG PO CAPS
1.0000 | ORAL_CAPSULE | Freq: Every day | ORAL | Status: DC
  Administered 2023-05-30 – 2023-06-03 (×5): 1 via ORAL
  Filled 2023-05-30 (×6): qty 1

## 2023-05-30 NOTE — Plan of Care (Signed)
  Problem: Consults Goal: RH GENERAL PATIENT EDUCATION Description: See Patient Education module for education specifics. Outcome: Progressing Goal: Skin Care Protocol Initiated - if Braden Score 18 or less Description: If consults are not indicated, leave blank or document N/A Outcome: Progressing   Problem: RH BOWEL ELIMINATION Goal: RH STG MANAGE BOWEL WITH ASSISTANCE Description: STG Manage Bowel with Mod I Assistance. Outcome: Progressing Goal: RH STG MANAGE BOWEL W/MEDICATION W/ASSISTANCE Description: STG Manage Bowel with Medication with Mod I Assistance. Outcome: Progressing   Problem: RH SKIN INTEGRITY Goal: RH STG MAINTAIN SKIN INTEGRITY WITH ASSISTANCE Description: STG Maintain Skin Integrity With Mod I Assistance. Outcome: Progressing Goal: RH STG ABLE TO PERFORM INCISION/WOUND CARE W/ASSISTANCE Description: STG Able To Perform Incision/Wound Care With Mod I Assistance. Outcome: Progressing   Problem: RH SAFETY Goal: RH STG ADHERE TO SAFETY PRECAUTIONS W/ASSISTANCE/DEVICE Description: STG Adhere to Safety Precautions With Mod I Assistance/Device. Outcome: Progressing Goal: RH STG DECREASED RISK OF FALL WITH ASSISTANCE Description: STG Decreased Risk of Fall With Mod I Assistance. Outcome: Progressing   Problem: RH PAIN MANAGEMENT Goal: RH STG PAIN MANAGED AT OR BELOW PT'S PAIN GOAL Description: < 4 on a 0-10 pain scale, treat with prns. Outcome: Progressing   Problem: RH KNOWLEDGE DEFICIT GENERAL Goal: RH STG INCREASE KNOWLEDGE OF SELF CARE AFTER HOSPITALIZATION Description: Patient will demonstrate knowledge of pain management, skin/wound care management, safety awareness with educational materials and handouts provided by staff during rehabilitation admission.. Outcome: Progressing

## 2023-05-30 NOTE — Progress Notes (Signed)
PROGRESS NOTE   Subjective/Complaints: No major issues. Feels a little constipated. Pain controlled. Ready for therapy!  ROS: Patient denies fever, rash, sore throat, blurred vision, dizziness, nausea, vomiting, diarrhea, cough, shortness of breath or chest pain,  , headache, or mood change.   Objective:   No results found.  Recent Labs    05/30/23 0532  WBC 7.5  HGB 8.4*  HCT 25.5*  PLT 439*   No results for input(s): "NA", "K", "CL", "CO2", "GLUCOSE", "BUN", "CREATININE", "CALCIUM" in the last 72 hours.   Intake/Output Summary (Last 24 hours) at 05/30/2023 1118 Last data filed at 05/30/2023 0700 Gross per 24 hour  Intake 713 ml  Output --  Net 713 ml        Physical Exam: Vital Signs Blood pressure (!) 105/59, pulse 73, temperature 97.7 F (36.5 C), resp. rate 17, height 5\' 6"  (1.676 m), weight 55.9 kg, SpO2 99%.      Constitutional: No distress . Vital signs reviewed. HEENT: NCAT, EOMI, oral membranes moist Neck: supple Cardiovascular: RRR without murmur. No JVD    Respiratory/Chest: CTA Bilaterally without wheezes or rales. Normal effort    GI/Abdomen: BS +, non-tender, non-distended Ext: no clubbing, cyanosis, or edema Psych: pleasant and cooperative  Neurological: Ox3 Musculoskeletal:     Cervical back: Neck supple. No tenderness.     Right lower leg: sl thigh swelling. Hip tender to palpation     Left lower leg: No edema.     Comments: Ue's 5-/5 B/L RLE- HF 2/5; DF/PF 4+/5 LLE- 5-/5 throughout  Skin:    General: post-op dressing in place, old blood on dressing--no spread  Assessment/Plan: 1. Functional deficits which require 3+ hours per day of interdisciplinary therapy in a comprehensive inpatient rehab setting. Physiatrist is providing close team supervision and 24 hour management of active medical problems listed below. Physiatrist and rehab team continue to assess barriers to  discharge/monitor patient progress toward functional and medical goals  Care Tool:  Bathing    Body parts bathed by patient: Right arm, Left arm, Chest, Abdomen, Front perineal area, Buttocks, Right upper leg, Left upper leg, Face   Body parts bathed by helper: Right lower leg, Left lower leg     Bathing assist Assist Level: Minimal Assistance - Patient > 75%     Upper Body Dressing/Undressing Upper body dressing   What is the patient wearing?: Pull over shirt    Upper body assist Assist Level: Minimal Assistance - Patient > 75%    Lower Body Dressing/Undressing Lower body dressing      What is the patient wearing?: Underwear/pull up, Pants     Lower body assist Assist for lower body dressing: Minimal Assistance - Patient > 75%     Toileting Toileting    Toileting assist Assist for toileting: Moderate Assistance - Patient 50 - 74%     Transfers Chair/bed transfer  Transfers assist     Chair/bed transfer assist level: Contact Guard/Touching assist     Locomotion Ambulation   Ambulation assist      Assist level: Contact Guard/Touching assist Assistive device: Walker-rolling Max distance: 20   Walk 10 feet activity   Assist  Assist level: Contact Guard/Touching assist Assistive device: Walker-rolling   Walk 50 feet activity   Assist Walk 50 feet with 2 turns activity did not occur: Safety/medical concerns         Walk 150 feet activity   Assist Walk 150 feet activity did not occur: Safety/medical concerns         Walk 10 feet on uneven surface  activity   Assist Walk 10 feet on uneven surfaces activity did not occur: Safety/medical concerns         Wheelchair     Assist Is the patient using a wheelchair?: Yes Type of Wheelchair: Manual    Wheelchair assist level: Supervision/Verbal cueing Max wheelchair distance: 10    Wheelchair 50 feet with 2 turns activity    Assist        Assist Level: Total  Assistance - Patient < 25% (Patient propelled 10 ft (20%))   Wheelchair 150 feet activity     Assist      Assist Level: Total Assistance - Patient < 25% (Patient propelled 71ft)   Blood pressure (!) 105/59, pulse 73, temperature 97.7 F (36.5 C), resp. rate 17, height 5\' 6"  (1.676 m), weight 55.9 kg, SpO2 99%.    Medical Problem List and Plan: 1. Functional deficits secondary to R hip fx s/p THA WBAT             -patient may  shower- cover incision/dressing             -ELOS/Goals: 14-18 days supervision  cON'T cir pt AND ot  Grounds pass ordered  added Scheduled pain meds for therapy since it's the most limiting issues -pt in better spirits today 2.  Antithrombotics: -DVT/anticoagulation:  Pharmaceutical: continue Lovenox 40mg  daily             -antiplatelet therapy: plan aspirin 81 mg BID at discharge             -05/25/23 BLE venous duplex negative   3. Pain Management: Tylenol 650 mg q 6 hours -Robaxin as needed -oxycodone as needed -12/20- pain 7/10 this AM- asked nursing to give pain meds- pt feels meds work when takes them 12/23- pain "OK"- con't regimen 12/26- pt receiving scheduled Oxy 10 mg with 6:30 am and noon which seems to be helping with therapy tolerance 4. Mood/Behavior/Sleep: LCSW to evaluate and provide emotional support             -continue Abilify 2 mg q HS             -continue Cymbalta DR 120 mg daily             -continue Remeron 30 mg q HS             -antipsychotic agents: n/a   12/20- no changes to depression meds- is chronic dosing 5. Neuropsych/cognition: This patient is capable of making decisions on his own behalf.   6. Skin/Wound Care: Routine skin care checks             -monitor surgical incision (has surgical staples)   12/26 continue post-op dressing until time for staples to come out ~12/31 7. Fluids/Electrolytes/Nutrition: Routine Is and Os and follow-up chemistries   12/20- labs look better- Na up to 134-  8: BPH: continue  Proscar 5 mg q AM and Flomax 0.4 mg q 48 hours   12/23- voiding well per pt- intermittent incontinence-  9: Right hip displaced subcapital femoral neck fracture s/p right direct anterior total  hip arthroplasty 05/21/2023 by Dr. Magnus Ivan             -WBAT             -follow-up in 2 weeks   10: ABLA/iron deficiency:              -consider oral iron supplementation             -follow-up CBC   12/26-further drift but seems to be slowing. Will continue serial checks   -add iron supp   -check stool for OB 11: Hyponatremia: follow-up BMP   12/20- up to 134 12: Leukocytosis: resolved 13. Hypotension D/c proscar Vitals:   05/26/23 1316 05/26/23 1837 05/27/23 0409 05/27/23 1400  BP: 136/67 106/61 119/67 98/62   05/27/23 1920 05/28/23 0445 05/28/23 1420 05/28/23 1954  BP: 122/64 (!) 118/58 (!) 107/57 (!) 101/59   05/29/23 0413 05/29/23 1315 05/29/23 1922 05/30/23 0510  BP: (!) 103/48 120/65 (!) 102/59 (!) 105/59    14. Constipation:  -05/25/23 unclear when LBM was, maybe 1-2 days ago, on colace 100mg  BID, senna 8.6mg  daily, and miralax qM/W/F; monitor if no BM this weekend then may need to adjust meds.  -05/26/23 still no BM, will increase miralax to daily including today, monitor, may need sorbitol tomorrow if no BM  12/23- 3 small BM's in last 24 hours- con't miralax  12/24- 3 BM's yesterday- too loose- will stop Colace and con't Miralax. Esp since going to be taking pain meds scheduled.   12/26: last BM 12/24    -now off miralax   -stools seem to be slowing/normalizing--no changes today   LOS: 7 days A FACE TO FACE EVALUATION WAS PERFORMED  Ranelle Oyster 05/30/2023, 11:18 AM

## 2023-05-30 NOTE — Patient Care Conference (Signed)
Inpatient RehabilitationTeam Conference and Plan of Care Update Date: 05/28/2023   Time: 11:10 AM    Patient Name: Jeremy [name]      Medical Record Number: [id number]  Date of Birth: [birth year] Sex: Male         Room/Bed: 4M13C/4M13C-01 Payor Info: Payor: MEDICARE / Plan: MEDICARE PART A AND B / Product Type: *No Product type* /    Admit Date/Time:  05/23/2023  4:28 PM  Primary Diagnosis:  Closed right hip fracture, initial encounter [facility])  Hospital Problems: Principal Problem:   Closed right hip fracture, initial encounter [facility])    Expected Discharge Date: Expected Discharge Date: 06/03/23  Team Members Present: Physician leading conference: Dr. Genice Rouge Social Worker Present: Cecile Sheerer, LCSWA Nurse Present: Chana Bode, RN PT Present: Midge Minium, PT OT Present: Ardis Rowan, Joline Salt, OT SLP Present: Feliberto Gottron, SLP PPS Coordinator present : Fae Pippin, SLP     Current Status/Progress Goal Weekly Team Focus  Bowel/Bladder   continent of b/b   maintain continence   offer toileting qshift and PRN answer call bell promptly    Swallow/Nutrition/ Hydration               ADL's   bathing-min A; LB dressing-min A; UB dressing-min A; toileting-min A   mod I overall   BADL retraining, activity tolerance, safety awareness, education    Mobility   CGA for gait and transfers. limited by hip pain and low initiation, thinking 12/30 d/c   mod i transfers, supervision gait  endurance, strength, stair navigation    Communication                Safety/Cognition/ Behavioral Observations               Pain   patient complain of right hip pain from surgery usually twice a day 7/10 relieved with PRN OXY 10mg    keep pain <3/10   assess pain qshift and PRN encourage patient to ask for medication when in pain    Skin   right hip surgical incision   maintain infection free incision  assess qshift and PRN for s/s of infection      Discharge  Planning:  Pt to discharge to home with his wife will be his primary caregiver. SW will confirm there are no barriers to discharge.   Team Discussion: Patient post right hip fracture with pain in hip with movement; MD scheduled pain medication.  Limited by depression, flat affect and little initiation.  Patient on target to meet rehab goals: yes, currently needs assistance with toileting and putting on socks and shoes.  *See Care Plan and progress notes for long and short-term goals.   Revisions to Treatment Plan:  N/a   Teaching Needs: Safety, medications, transfers, toileting, etc.   Current Barriers to Discharge: Decreased caregiver support  Possible Resolutions to Barriers: Family education OP follow up services     Medical Summary Current Status: R hippain iwht movement- not really taking prn pain meds  Barriers to Discharge: Behavior/Mood;Inadequate Nutritional Intake;Medical stability;Uncontrolled Pain;Self-care education;Weight bearing restrictions  Barriers to Discharge Comments: limited by pain-and depression- has walked 24ft-- was getting A from wife prior to fall for ADL's- Possible Resolutions to [employer] Focus: will schedule Oxycodone at 6:30 and noon- family has unrealisitic expectations about what level of function pt needs to be at. - Monday 12/30   Continued Need for Acute Rehabilitation Level of Care: The patient requires daily medical management by a physician with specialized  training in physical medicine and rehabilitation for the following reasons: Direction of a multidisciplinary physical rehabilitation program to maximize functional independence : Yes Medical management of patient stability for increased activity during participation in an intensive rehabilitation regime.: Yes Analysis of laboratory values and/or radiology reports with any subsequent need for medication adjustment and/or medical intervention. : Yes   I attest that I was present,  lead the team conference, and concur with the assessment and plan of the team.   Chana Bode B 05/30/2023, 8:41 AM

## 2023-05-30 NOTE — Progress Notes (Signed)
Physical Therapy Discharge Summary  Patient Details  Name: Jeremy [name] MRN: [id number] Date of Birth: 23-Aug-1935  Date of Discharge from PT service:June 02, 2023  Today's Date: 06/02/2023 PT Individual Time: 4034-7425 PT Individual Time Calculation (min): 40 min    Patient has met 7 of 7 long term goals due to improved activity tolerance, improved balance, ability to compensate for deficits, improved attention, and improved awareness.  Patient to discharge at an ambulatory level Supervision.   Patient's care partner is independent to provide the necessary physical assistance at discharge. Pt to d/c home with his wife who is able to provide assist as needed.   Reasons goals not met: NA  Recommendation:  Patient will benefit from ongoing skilled PT services in outpatient setting to continue to advance safe functional mobility, address ongoing impairments in strength, balance, ROM, activity tolerance, and minimize fall risk.  Equipment: RW  Reasons for discharge: treatment goals met and discharge from hospital  Patient/family agrees with progress made and goals achieved: Yes  PT Discharge Precautions/Restrictions Precautions Precautions: Fall Restrictions Weight Bearing Restrictions Per Provider Order: Yes RLE Weight Bearing Per Provider Order: Weight bearing as tolerated Pain Pain Assessment Pain Scale: 0-10 Pain Score: 0-No pain Patients Stated Pain Goal: 0 Pain Interference Pain Interference Pain Effect on Sleep: 1. Rarely or not at all Pain Interference with Therapy Activities: 1. Rarely or not at all Pain Interference with Day-to-Day Activities: 1. Rarely or not at all Vision/Perception  Perception Perception: Within Functional Limits  Cognition Overall Cognitive Status: Within Functional Limits for tasks assessed Arousal/Alertness: Awake/alert Orientation Level: Oriented X4 Sensation Sensation Light Touch: Appears Intact Coordination Coordination and  Movement Description: limited by weakness R LE Motor  Motor Motor: Within Functional Limits  Mobility Bed Mobility Bed Mobility: Rolling Right;Supine to Sit;Sit to Supine Supine to Sit: Independent with assistive device Sit to Supine: Independent with assistive device Transfers Transfers: Sit to Stand;Stand to Sit;Stand Pivot Transfers Sit to Stand: Independent with assistive device Stand to Sit: Independent with assistive device Stand Pivot Transfers: Independent with assistive device Transfer (Assistive device): Rollator Locomotion  Gait Ambulation: Yes Gait Assistance: Supervision/Verbal cueing Gait Distance (Feet): 300 Feet Assistive device: Rollator Stairs / Additional Locomotion Stairs: Yes Stairs Assistance: Supervision/Verbal cueing Stair Management Technique: One rail Right Number of Stairs: 12 Height of Stairs: 6 Pick up small object from the floor assist level: Supervision/Verbal cueing Pick up small object from the floor assistive device: rollator and Engineer, manufacturing Wheelchair Mobility: No  Trunk/Postural Assessment  Cervical Assessment Cervical Assessment: Within Functional Limits Thoracic Assessment Thoracic Assessment: Within Functional Limits Lumbar Assessment Lumbar Assessment: Within Functional Limits Postural Control Postural Control: Within Functional Limits  Balance Balance Balance Assessed: Yes Static Sitting Balance Static Sitting - Balance Support: Feet supported Static Sitting - Level of Assistance: 7: Independent Static Standing Balance Static Standing - Balance Support: Bilateral upper extremity supported Static Standing - Level of Assistance: 6: Modified independent (Device/Increase time) Dynamic Standing Balance Dynamic Standing - Balance Support: During functional activity;Bilateral upper extremity supported Dynamic Standing - Level of Assistance: 5: Stand by assistance Extremity Assessment  B UEs as per OT discharge  summary RLE Assessment RLE Assessment: Exceptions to [facility] Passive Range of Motion (PROM) Comments: WFLs General Strength Comments: R hip 3-/5 to 3/5 otherwise 4/5 LLE Assessment LLE Assessment: Within Functional Limits  Treatment: Pt was seen bedside in the pm. Pt performed bed mobility independent with increased time. Pt performed all transfers with rollator and independent. Pt ambulated distances  for 150, 250, 500 and 400 feet with rollator and S. Pt ascended.descended 12 stairs with 1 rail and S. Pt returned to room following treatment and left sitting up in bed with all needs within reach and wife at bedside. Discussed with pt's wife at this time pt is I for transfers and S for gait with rollator and S for stairs. Pt requires 24/7 supervision at this time. Pt's wife verbalized understanding.    Rayford Halsted 06/02/2023, 12:39 PM

## 2023-05-30 NOTE — Progress Notes (Signed)
Physical Therapy Session Note  Patient Details  Name: Jeremy [name] MRN: [id number] Date of Birth: [birth year]  Today's Date: 05/30/2023 PT Individual Time: 1050-1204 PT Individual Time Calculation (min): 74 min   Short Term Goals: Week 1:  PT Short Term Goal 1 (Week 1): pt will navigate stairs x 2 with CGA PT Short Term Goal 2 (Week 1): Pt will ambulate x 100 ft with LRAD  Skilled Therapeutic Interventions/Progress Updates: Pt presented in bed with wife present agreeable to therapy. Pt c/o mild pain in distal portion of thigh. Upon discussion noted more ITB tightness, addressed later in session. Pt completed bed mobility with supervision and donned shoes with set up. Pt requesting to use bathroom prior to leaving room. Completed ambulatory transfer to bathroom and completed toilet transfer with supervision, per pt request wife assisted with peri-care. Pt then ambulated to main gym with RW and supervision. At mat completed sit to supine with supervision and participated in the following supine therex for general upper body and RLE strengthening.   UE therex (performed bilaterally)  Chest press with 4lb dowel 2 x 10 Shoulder extension with 4lb dowel 2 x 10 Triceps extension with 3lb barbell 2 x 10  While performing UE therex PTA Placed hot pack on distal portion of R thigh. PTA then completed STM and CFM to ITB for pain management. PT then completed the follow LE therex.   SAQ 2lb weight 2 x 10 SLR with R foot slightly elevated 2 x 10 Sidelying clamshells AROM 2 x 10 Sidelying hip abd 2 x 10  Pt then completed sidelying to supine to sit with supervision and ambulated back to room in same manner as prior. Pt returned to bed at end of session completing sit to supine with supervision. Pt left in bed at end of session with wife present and current needs met.       Therapy Documentation Precautions:  Precautions Precautions: Fall Restrictions Weight Bearing Restrictions Per Provider Order:  No RLE Weight Bearing Per Provider Order: Weight bearing as tolerated General:   Vital Signs:     Therapy/Group: Individual Therapy  Kayte Borchard 05/30/2023, 12:33 PM

## 2023-05-30 NOTE — Progress Notes (Signed)
Physical Therapy Session Note  Patient Details  Name: Jeremy [name] MRN: [id number] Date of Birth: May 21, 1936  Today's Date: 05/30/2023 PT Individual Time: 0900-1000 PT Individual Time Calculation (min): 60 min   Short Term Goals: Week 1:  PT Short Term Goal 1 (Week 1): pt will navigate stairs x 2 with CGA PT Short Term Goal 2 (Week 1): Pt will ambulate x 100 ft with LRAD  Skilled Therapeutic Interventions/Progress Updates:    pt received in bed and agreeable to therapy. Pt reports hip pain up to 9/10 pain, premedicated. Rest and positioning provided as needed.   Bed mobility nearing independent, no bed rail. Donned shoes with supervision. Pt ambulated throughout session with RW and supervision. Note decr stance time on RLE. Rest breaks for pain management as needed. Cued to stand/sit without RW to decr reliance with transfers.   Pt performed 3 way hip with cueing for technique and CGA for balance with RW. 4 x 5, pt reports pain decr after first set then stable.   Sit to stand on airex pad 4 x 10 for dynamic balance and stability. X 1 LOB posteriorly resulting in quick sit, but no assist required.   Pt then returned to room and to bed with supervision, was left with all needs in reach and alarm active.     Therapy Documentation Precautions:  Precautions Precautions: Fall Restrictions Weight Bearing Restrictions Per Provider Order: No RLE Weight Bearing Per Provider Order: Weight bearing as tolerated General:      Therapy/Group: Individual Therapy  Juluis Rainier 05/30/2023, 9:23 AM

## 2023-05-30 NOTE — Progress Notes (Signed)
Occupational Therapy Session Note  Patient Details  Name: Jeremy [name] MRN: [id number] Date of Birth: January 09, 1936  Today's Date: 05/30/2023 OT Individual Time: 1405-1500 OT Individual Time Calculation (min): 55 min    Short Term Goals: Week 2:  OT Short Term Goal 1 (Week 2): STG=LTG 2/2 ELOS  Skilled Therapeutic Interventions/Progress Updates:    Pt received supine with no c/o pain, agreeable to OT session, requesting to take a shower. He completed sit > stand x5 from EOB with CGA to doff clothing. He required min A to doff pants from RLE. Surgical dressing reinforced with tegaderm to ensure water occlusion. He completed functional mobility into the bathroom with CGA using the RW. First he completed toileting tasks with close (S), voiding urine, then transferred into the shower onto TTB with min cueing for sequencing. He completed UB/LB bathing sit <> stand from the TTB, requiring min A for washing feet, but (S)- CGA for the rest. He transferred back to the EOB with (S) using the RW. He completed dressing EOB with CGA, min A to don R sock. He completed 150 ft of functional mobility to the therapy gym with the RW with CGA. He sat EOM and completed BUE strengthening circuit holding a 4 lb dowel- reaching forward and overhead to strengthen shoulders for UE support on the RW during ADLs. 3x8 repetitions. Pt completed blocked practice sit <> stand, 3x5 repetitions with no UE support, to challenge generalized strengthening for ADL transfers, as well as increasing functional activity tolerance and cardiorespiratory endurance. Pt required CGA overall. Patient required increased time for initiation, cuing, rest breaks, and for completion of tasks throughout session. Utilized therapeutic use of self throughout to promote efficiency. He returned to his room and was left supine with all needs met, wife present.   Therapy Documentation Precautions:  Precautions Precautions: Fall Restrictions Weight Bearing  Restrictions Per Provider Order: No RLE Weight Bearing Per Provider Order: Weight bearing as tolerated  Therapy/Group: Individual Therapy  Crissie Reese 05/30/2023, 6:59 AM

## 2023-05-31 DIAGNOSIS — S7291XS Unspecified fracture of right femur, sequela: Secondary | ICD-10-CM | POA: Diagnosis not present

## 2023-05-31 DIAGNOSIS — Z96641 Presence of right artificial hip joint: Secondary | ICD-10-CM | POA: Diagnosis not present

## 2023-05-31 DIAGNOSIS — G8918 Other acute postprocedural pain: Secondary | ICD-10-CM | POA: Diagnosis not present

## 2023-05-31 DIAGNOSIS — D62 Acute posthemorrhagic anemia: Secondary | ICD-10-CM | POA: Diagnosis not present

## 2023-05-31 LAB — CBC
HCT: 26.2 % — ABNORMAL LOW (ref 39.0–52.0)
Hemoglobin: 8.6 g/dL — ABNORMAL LOW (ref 13.0–17.0)
MCH: 30.7 pg (ref 26.0–34.0)
MCHC: 32.8 g/dL (ref 30.0–36.0)
MCV: 93.6 fL (ref 80.0–100.0)
Platelets: 515 10*3/uL — ABNORMAL HIGH (ref 150–400)
RBC: 2.8 MIL/uL — ABNORMAL LOW (ref 4.22–5.81)
RDW: 12.9 % (ref 11.5–15.5)
WBC: 7.3 10*3/uL (ref 4.0–10.5)
nRBC: 0 % (ref 0.0–0.2)

## 2023-05-31 NOTE — Progress Notes (Signed)
PROGRESS NOTE   Subjective/Complaints: Pt in bed. Says he feels ok. Slept fairly well. In a bit of a stand-offish mood.  ROS: Patient denies fever, rash, sore throat, blurred vision, dizziness, nausea, vomiting, diarrhea, cough, shortness of breath or chest pain,   headache, or mood change. .   Objective:   No results found.  Recent Labs    05/30/23 0532 05/31/23 0742  WBC 7.5 7.3  HGB 8.4* 8.6*  HCT 25.5* 26.2*  PLT 439* 515*   No results for input(s): "NA", "K", "CL", "CO2", "GLUCOSE", "BUN", "CREATININE", "CALCIUM" in the last 72 hours.   Intake/Output Summary (Last 24 hours) at 05/31/2023 1046 Last data filed at 05/30/2023 1806 Gross per 24 hour  Intake 118 ml  Output --  Net 118 ml        Physical Exam: Vital Signs Blood pressure (!) 141/71, pulse 79, temperature 97.8 F (36.6 C), resp. rate 20, height 5\' 6"  (1.676 m), weight 54.1 kg, SpO2 99%.      Constitutional: No distress . Vital signs reviewed. HEENT: NCAT, EOMI, oral membranes moist Neck: supple Cardiovascular: RRR without murmur. No JVD    Respiratory/Chest: CTA Bilaterally without wheezes or rales. Normal effort    GI/Abdomen: BS +, non-tender, non-distended Ext: no clubbing, cyanosis, or edema Psych: flat but cooperative Neurological: Ox3 Musculoskeletal:     Cervical back: Neck supple. No tenderness.     Right lower leg: sl thigh swelling. Hip tender to palpation     Left lower leg: No edema.     Comments: Ue's 5-/5 B/L RLE- HF 2/5; DF/PF 4+/5 LLE- 5-/5 throughout  Skin:    General: hydrocolloid dressing in place, old blood on dressing--no spread  Assessment/Plan: 1. Functional deficits which require 3+ hours per day of interdisciplinary therapy in a comprehensive inpatient rehab setting. Physiatrist is providing close team supervision and 24 hour management of active medical problems listed below. Physiatrist and rehab team  continue to assess barriers to discharge/monitor patient progress toward functional and medical goals  Care Tool:  Bathing    Body parts bathed by patient: Right arm, Left arm, Chest, Abdomen, Front perineal area, Buttocks, Right upper leg, Left upper leg, Face   Body parts bathed by helper: Right lower leg, Left lower leg     Bathing assist Assist Level: Minimal Assistance - Patient > 75%     Upper Body Dressing/Undressing Upper body dressing   What is the patient wearing?: Pull over shirt    Upper body assist Assist Level: Minimal Assistance - Patient > 75%    Lower Body Dressing/Undressing Lower body dressing      What is the patient wearing?: Underwear/pull up, Pants     Lower body assist Assist for lower body dressing: Minimal Assistance - Patient > 75%     Toileting Toileting    Toileting assist Assist for toileting: Moderate Assistance - Patient 50 - 74%     Transfers Chair/bed transfer  Transfers assist     Chair/bed transfer assist level: Contact Guard/Touching assist     Locomotion Ambulation   Ambulation assist      Assist level: Contact Guard/Touching assist Assistive device: Walker-rolling Max distance: 20  Walk 10 feet activity   Assist     Assist level: Contact Guard/Touching assist Assistive device: Walker-rolling   Walk 50 feet activity   Assist Walk 50 feet with 2 turns activity did not occur: Safety/medical concerns         Walk 150 feet activity   Assist Walk 150 feet activity did not occur: Safety/medical concerns         Walk 10 feet on uneven surface  activity   Assist Walk 10 feet on uneven surfaces activity did not occur: Safety/medical concerns         Wheelchair     Assist Is the patient using a wheelchair?: Yes Type of Wheelchair: Manual    Wheelchair assist level: Supervision/Verbal cueing Max wheelchair distance: 10    Wheelchair 50 feet with 2 turns activity    Assist         Assist Level: Total Assistance - Patient < 25% (Patient propelled 10 ft (20%))   Wheelchair 150 feet activity     Assist      Assist Level: Total Assistance - Patient < 25% (Patient propelled 38ft)   Blood pressure (!) 141/71, pulse 79, temperature 97.8 F (36.6 C), resp. rate 20, height 5\' 6"  (1.676 m), weight 54.1 kg, SpO2 99%.    Medical Problem List and Plan: 1. Functional deficits secondary to R hip fx s/p THA WBAT             -patient may  shower- cover incision/dressing             -ELOS/Goals: 14-18 days supervision     Grounds pass ordered  added Scheduled pain meds for therapy since it's the most limiting issues -Continue CIR therapies including PT, OT  -discussed ortho f/u with wife 2.  Antithrombotics: -DVT/anticoagulation:  Pharmaceutical: continue Lovenox 40mg  daily             -antiplatelet therapy: plan aspirin 81 mg BID at discharge             -05/25/23 BLE venous duplex negative   3. Pain Management: Tylenol 650 mg q 6 hours -Robaxin as needed -oxycodone as needed -12/20- pain 7/10 this AM- asked nursing to give pain meds- pt feels meds work when takes them 12/23- pain "OK"- con't regimen 12/26-27- pt receiving scheduled Oxy 10 mg with 6:30 am and noon which seems to be helping with therapy tolerance 4. Mood/Behavior/Sleep: LCSW to evaluate and provide emotional support             -continue Abilify 2 mg q HS             -continue Cymbalta DR 120 mg daily             -continue Remeron 30 mg q HS             -antipsychotic agents: n/a   12/20- no changes to depression meds- is chronic dosing 5. Neuropsych/cognition: This patient is capable of making decisions on his own behalf.   6. Skin/Wound Care: Routine skin care checks             -monitor surgical incision (has surgical staples)   12/26 continue post-op dressing until time for staples to come out ~12/31 can remove day of dc 12/30 7. Fluids/Electrolytes/Nutrition: Routine Is and Os and  follow-up chemistries   12/20- labs look better- Na up to 134-  8: BPH: continue Proscar 5 mg q AM and Flomax 0.4 mg q 48 hours  12/23- voiding well per pt- intermittent incontinence-  9: Right hip displaced subcapital femoral neck fracture s/p right direct anterior total hip arthroplasty 05/21/2023 by Dr. Magnus Ivan             -WBAT             -follow-up in ~ 2 weeks   10: ABLA/iron deficiency:              -consider oral iron supplementation             -follow-up CBC   12/27 hgb up sl today to 8.5   -continue iron supp   -check stool for OB x 1   -recheck cbc Monday  11: Hyponatremia: follow-up BMP   12/20- up to 134 12: Leukocytosis: resolved 13. Hypotension D/c proscar Vitals:   05/27/23 1400 05/27/23 1920 05/28/23 0445 05/28/23 1420  BP: 98/62 122/64 (!) 118/58 (!) 107/57   05/28/23 1954 05/29/23 0413 05/29/23 1315 05/29/23 1922  BP: (!) 101/59 (!) 103/48 120/65 (!) 102/59   05/30/23 0510 05/30/23 1518 05/30/23 1902 05/31/23 0509  BP: (!) 105/59 (!) 99/56 (!) 108/53 (!) 141/71    14. Constipation:  -05/25/23 unclear when LBM was, maybe 1-2 days ago, on colace 100mg  BID, senna 8.6mg  daily, and miralax qM/W/F; monitor if no BM this weekend then may need to adjust meds.  -05/26/23 still no BM, will increase miralax to daily including today, monitor, may need sorbitol tomorrow if no BM  12/23- 3 small BM's in last 24 hours- con't miralax  12/24- 3 BM's yesterday- too loose- will stop Colace and con't Miralax. Esp since going to be taking pain meds scheduled.   12/27: had bm 12/26   -now off miralax   -stools seem to be slowing/normalizing--no changes today   LOS: 8 days A FACE TO FACE EVALUATION WAS PERFORMED  Ranelle Oyster 05/31/2023, 10:46 AM

## 2023-05-31 NOTE — Progress Notes (Signed)
Patient ID: Jeremy [name], male   DOB: 1936/02/06, 87 y.o.   MRN: [id number]  Per Pt, pt requests handicap placard. Pt will receive handicap placard prior to discharge.   Cecile Sheerer, MSW, LCSW Office: [phone] Cell: [phone] Fax: [phone]

## 2023-05-31 NOTE — Progress Notes (Signed)
Occupational Therapy Session Note  Patient Details  Name: Jeremy [name] MRN: [id number] Date of Birth: 24-Nov-1935  Today's Date: 05/31/2023 OT Individual Time: 0805-0900 & 4098-1191 OT Individual Time Calculation (min): 55 min & 40 min   Short Term Goals: Week 2:  OT Short Term Goal 1 (Week 2): STG=LTG 2/2 ELOS  Skilled Therapeutic Interventions/Progress Updates:  Session 1 Skilled OT intervention completed with focus on family education with wife present. Pt received semi supine in bed, agreeable to session. 6/10 pain reported in Rt hip; pre-medicated. OT offered rest breaks, repositioning throughout for pain reduction.  OT provided education on the following in prep for DC: -Recommended mod I for all mobility using RW (potentially rollator if pt demos improved sequencing with locking brakes) and mod I ADLs but supervision showers -Reviewed WBAT LLE precautions -Confirmed bathroom set up, with level walk in shower and tub/shower. Advised that tub bench or shower chair would be safest considering CLOF, environmental set up and pt's balance/endurance deficits -Discussed using lateral leans for peri-washing and use/installation of grab bars for balance  -Energy conservation strategies- lukewarm water, proper ventilation via fan or cracked door to reduce steam, minimizing stands in shower especially during hair washing with eyes closed, and planning ahead for shower tasks to be rested/have rest time following in case of fatigue -discussed option of installing grab bar or applying push up rails around toilet for standing from lower positions (as pt uses hand on rail or seat surface to sometimes stand himself)  OT assisted pt in demonstrating the following during session: -ambulation at supervision level using RW for max distances of 100 ft -trial of ambulation on unlevel surfaces with CGA (mulch texture) with RW to simulate walking in grass; verbal cues needed for body positioning -trial of  rollator (pt has at home, and often moves quicker than RW therefore introduced practice with rollator); able to ambulate up to 100 ft with supervision only, cues needed for first trial with locking breaks and backing rollator up to wall prior to sitting for stability -tub/shower transfer with supervision on TTB including lifting over threshold -multiple sit > stands with supervision from low chair, low BSC with/without grab bar for trial with standing from low surfaces to simulate home environment  Wife assisted with the following: -ambulation with RW with supervision for max distances of 100 ft -ensuring cleanliness after BM during toileting  Pt remained seated EOB with wife present, with bed alarm on/activated, and with all needs in reach at end of session.  Session 2 Skilled OT intervention completed with focus on functional mobility safety and endurance. Pt received supine in bed, agreeable to session. 7/10 pain reported in Rt hip; pre-medicated. OT offered rest breaks throughout for pain reduction.  Pt verbalized liking the rollator from earlier, with request to continue practicing the "steps for the handles." Transitioned to EOB with mod I, then completed all sit > stands and ambulatory transfers with supervision using rollator with intermittent cues provided for recall of locking prior to standing/sitting and for backing rollator up to surface prior to sit attempt.  Ambulated distances > 100 ft x4 during session. Pt with initial difficulty recalling the proper steps (lock by pulling down, and squeezing to temporarily pause). With pt's own terminology for increased recall, OT filled out visual sheet for reminder during mobility including "stand = lock" and "sit = lock" and "pause = squeeze." With this terminology, pt able to self direct himself with min fading to supervision for correct sequence. However would  benefit from continued practice.  Transitioned to pt ambulating with rollator around  gym to locate bean bags on chair and greater than head height levels to simulate retrieving food items in kitchen. No LOB, however pt did miss 2 bean bags in higher ranges with cues needed. Great recall of placing bean bags into box on rollator and discussed how this relates to IADL management. Ambulated back to room, transitioned mod I into bed, then pt remained semi supine, with bed alarm on/activated, and with all needs in reach at end of session.   Therapy Documentation Precautions:  Precautions Precautions: Fall Restrictions Weight Bearing Restrictions Per Provider Order: No RLE Weight Bearing Per Provider Order: Weight bearing as tolerated    Therapy/Group: Individual Therapy  Melvyn Novas, MS, OTR/L  05/31/2023, 3:08 PM

## 2023-05-31 NOTE — Progress Notes (Signed)
Physical Therapy Session Note  Patient Details  Name: Jeremy [name] MRN: [id number] Date of Birth: Apr 22, 1936  Today's Date: 05/31/2023 PT Individual Time: 0900-1000 and 1410-1445  PT Individual Time Calculation (min): 60 min and 35 min  Short Term Goals: Week 1:  PT Short Term Goal 1 (Week 1): pt will navigate stairs x 2 with CGA PT Short Term Goal 2 (Week 1): Pt will ambulate x 100 ft with LRAD  Skilled Therapeutic Interventions/Progress Updates: Pt presented in bed with wife present agreeable to therapy. Pt states unrated pain in L hip and distal thigh. Rest breaks provided as needed throughout session. Session focused on hands on family education with wife. Discussed pt's CLOF (supervision) and that he will not require any physical assist upon d/c. Per discussion with OT noted that pt had trialed rollator which he has at home and demonstrated fair safety with rollator in previous session. Pt completed bed mobility mod I without bed features and donned shoes with set up. Pt then stood with supervision and ambulated with rollator to ortho gym. Pt required verbal cues for managing brakes with rollator but demonstrated good safety with ambulating. In ortho gym pt was able to complete car transfer to sedan height with supervision and increased time then ambulated up ramp and managed rollator on mulch with CGA and verbal cues for managing rollator between surfaces. Pt then ambulated to main gym and practiced ascending/descending x 4 steps with use of post to simulate home set up. Pt able to complete with supervision and verbal cues for sequencing as pt initially wanted to perform reciprocally. Pt then practiced stepping up onto 4in block with rollator to simulate threshold step into home. Pt then ambulated throughout unit >647ft with rollator and supervision. Pt noted to have improved cadence and gait speed this session. Pt then returned to main gym and any questions wife had (handicap placard, endurance  deficits). Pt and wife unsure when OP eval will be set up therefore remaining session focused on development of HEP. Pt was able to complete 5-10 reps of each exercise as noted below with wife present throughout. HEP provided to wife and pt as noted below. Pt then ambulated back to room in same manner as prior with rollator and pt requested to use bathroom prior to returning to room. Pt's wife then signed off for ambulatory transfers to bathroom. Pt ambulated to bathroom and completed toilet transfers mod I with rollator (+ urinary void). Pt ambulated to bed and completed transfer to supine mod I. Pt left in bed at end of session with wife present and needs met.   Access Code: [email or web address] URL: [email or web address] Date: 05/31/2023 Prepared by: Cornell Barman Jaileen Janelle  Exercises - Supine Short Arc Quad  - 1 x daily - 7 x weekly - 3 sets - 10 reps - Small Range Straight Leg Raise  - 1 x daily - 7 x weekly - 3 sets - 10 reps - Sidelying Hip Abduction  - 1 x daily - 7 x weekly - 3 sets - 10 reps - Clamshell  - 1 x daily - 7 x weekly - 3 sets - 10 reps - Supine Bridge  - 1 x daily - 7 x weekly - 3 sets - 10 reps - Seated Long Arc Quad  - 1 x daily - 7 x weekly - 3 sets - 10 reps  Tx2: Pt presented in bed agreeable to therapy. Pt states unrated pain with activity. Session focused on general strengthening and conditioning. Pt performed bed  mobility mod I and donned shoes with set up. Performed Sit to stand with supervision and noted that wife had placed reminder on rollator for pt management. Pt ambulated to dayroom >246ft with supervision. After brief rest pt transferred to NuStep with supervision and completed x 10 min L3 for hip ROM and increased endurance. Pt urged to maintain >25SPM during activity. Pt then performed an additional x 3 min L3 BLE only for increased BLE ms recruitment. Once completed stood and ambulated back to room with rollator and supervision. Of note throughout session pt with  improved safety with rollator requiring decreased cues for locking/unlocking brakes. Upon return to room pt transferred to bed and transferred to supine mod I. Pt left in bed at end of session with call bell within reach, bed alarm on, and needs met.      Therapy Documentation Precautions:  Precautions Precautions: Fall Restrictions Weight Bearing Restrictions Per Provider Order: Yes RLE Weight Bearing Per Provider Order: Weight bearing as tolerated  Therapy/Group: Individual Therapy  Millenia Waldvogel 05/31/2023, 4:41 PM

## 2023-06-01 DIAGNOSIS — K5901 Slow transit constipation: Secondary | ICD-10-CM | POA: Diagnosis not present

## 2023-06-01 DIAGNOSIS — S72001A Fracture of unspecified part of neck of right femur, initial encounter for closed fracture: Secondary | ICD-10-CM | POA: Diagnosis not present

## 2023-06-01 NOTE — Progress Notes (Signed)
Occupational Therapy Session Note  Patient Details  Name: Jeremy [name] MRN: [id number] Date of Birth: February 07, 1936  Today's Date: 06/01/2023 OT Individual Time: 8469-6295 OT Individual Time Calculation (min): 30 min    Short Term Goals: Week 2:  OT Short Term Goal 1 (Week 2): STG=LTG 2/2 ELOS  Skilled Therapeutic Interventions/Progress Updates:   Pt seen for skilled OT session with wife present bedside. Pt resting supine in bed and open to all training. Wife reports she would like OT to educate pt and her on best strategies for retrieving ice cream from a drawer freezer safely. Pt reports this is one of his favorite foods and a meaningful task. OT observed pt move to EOB without assist, appreciate note on rollator and self check if brakes locked. Min cue needed for hand placement to sit to stand and then close S for amb to and from ortho gym for simulation of freezer item retrieval. OT training on use of counter or as pt described pantry door frame is solid next to freezer thus holding to it and reaching with R hand with rollator next to pt appeared safest option. Pt able to use rollator seat to transport to simulated island in kitchen and scoop ice cream and then replace in drawer with close s and min cues. Pt reports suffering from s/s of depression and has given up gardening but likes to sit on his porch. OT trained in red (med) resistance tband fro triceps press 10 reps x 3 sets with breathing integration to release endorphins and overall well being benefits. Pt then able to teach back strategies learned once back in room. Pt requested to return to supine bed level and left with needs and safety measures and wife present.   Pain: none at rest, un-rated thigh pain during amb with relief with distraction and rest  Therapy Documentation Precautions:  Precautions Precautions: Fall Restrictions Weight Bearing Restrictions Per Provider Order: Yes RLE Weight Bearing Per Provider Order: Weight  bearing as tolerated   Therapy/Group: Individual Therapy  Vicenta Dunning 06/01/2023, 7:36 AM

## 2023-06-01 NOTE — Progress Notes (Shared)
Occupational Therapy Discharge Summary  Patient Details  Name: Jeremy [name] MRN: [id number] Date of Birth: 1935-11-06  Date of Discharge from OT service:{Time; dates multiple:304500300}  {CHL IP REHAB OT TIME CALCULATIONS:304400400}   Patient has met {NUMBERS 0-12:18577} of {NUMBERS 0-12:18577} long term goals due to {due MV:7846962}.  Patient to discharge at overall {LOA:3049010} level.  Patient's care partner {care partner:3041650} to provide the necessary {assistance:3041652} assistance at discharge.    Reasons goals not met: ***  Recommendation:  Patient will benefit from ongoing skilled OT services in {setting:3041680} to continue to advance functional skills in the area of {ADL/iADL:3041649}.  Equipment: {equipment:3041657}  Reasons for discharge: {Reason for discharge:3049018}  Patient/family agrees with progress made and goals achieved: {Pt/Family agree with progress/goals:3049020}  OT Discharge ADL ADL Eating: Set up Where Assessed-Eating: Edge of bed Grooming: Setup Where Assessed-Grooming: Sitting at sink, Edge of bed Upper Body Bathing: Modified independent Where Assessed-Upper Body Bathing: Sitting at sink Lower Body Bathing: Minimal assistance Where Assessed-Lower Body Bathing: Sitting at sink, Standing at sink, Edge of bed Upper Body Dressing: Modified independent (Device) Where Assessed-Upper Body Dressing: Edge of bed Lower Body Dressing: Minimal assistance Where Assessed-Lower Body Dressing: Edge of bed Toileting: Modified independent Where Assessed-Toileting: Neurosurgeon Method: Proofreader: Bedside commode, Other (comment) (RW) Tub/Shower Transfer: Unable to assess Tub/Shower Transfer Method: Unable to assess Film/video editor: Close supervision Film/video editor Method: Designer, industrial/product: Sales promotion account executive Baseline Vision/History: 1 Wears  glasses (blurred vision, dry glaucoma) Patient Visual Report: No change from baseline Vision Assessment?: No apparent visual deficits Perception  Perception: Within Functional Limits Praxis Praxis: WFL Cognition Cognition Overall Cognitive Status: Within Functional Limits for tasks assessed Arousal/Alertness: Awake/alert Orientation Level: Person;Place;Situation Person: Oriented Place: Oriented Situation: Oriented Memory: Appears intact Awareness: Appears intact Problem Solving: Appears intact Safety/Judgment: Impaired Brief Interview for Mental Status (BIMS) Repetition of Three Words (First Attempt): 3 Temporal Orientation: Year: Correct Temporal Orientation: Month: Accurate within 5 days Temporal Orientation: Day: Correct Recall: "Sock": Yes, no cue required Recall: "Blue": Yes, no cue required Recall: "Bed": Yes, no cue required BIMS Summary Score: 15 Sensation Sensation Light Touch: Appears Intact Hot/Cold: Appears Intact Proprioception: Appears Intact Stereognosis: Not tested Motor  Motor Motor: Within Functional Limits Motor - Skilled Clinical Observations: delayed initiation at times, but largely [facility] and limited by hip pain    Trunk/Postural Assessment  Cervical Assessment Cervical Assessment: Within Functional Limits Thoracic Assessment Thoracic Assessment: Within Functional Limits Lumbar Assessment Lumbar Assessment: Within Functional Limits Postural Control Postural Control: Within Functional Limits  Balance Static Sitting Balance Static Sitting - Balance Support: Feet supported Static Sitting - Level of Assistance: 7: Independent Dynamic Sitting Balance Dynamic Sitting - Balance Support: During functional activity Dynamic Sitting - Level of Assistance: 6: Modified independent (Device/Increase time) Extremity/Trunk Assessment RUE Assessment RUE Assessment: Exceptions to [facility] Active Range of Motion (AROM) Comments: Shoulders > wrist WFL, 1-3 digits  with swan neck deformity General Strength Comments: 3+/5 grossly including grasp LUE Assessment LUE Assessment: Exceptions to [facility] Active Range of Motion (AROM) Comments: Shoulders > wrist WFL, 3-5 digits with swan neck deformity General Strength Comments: 3-/5 grossly including grasp   Rich Brave 06/01/2023, 7:38 AM

## 2023-06-01 NOTE — Plan of Care (Addendum)
PT still depressed and has trouble with interactions and talking about care after d/c , wife talking for pt at times. PT not motivated to get out of bed, needs lots of encouragement. Problem: Consults Goal: RH GENERAL PATIENT EDUCATION Description: See Patient Education module for education specifics. Outcome: Progressing Goal: Skin Care Protocol Initiated - if Braden Score 18 or less Description: If consults are not indicated, leave blank or document N/A Outcome: Progressing   Problem: RH SKIN INTEGRITY Goal: RH STG ABLE TO PERFORM INCISION/WOUND CARE W/ASSISTANCE Description: STG Able To Perform Incision/Wound Care With Mod I Assistance. Outcome: Progressing   Problem: RH SAFETY Goal: RH STG DECREASED RISK OF FALL WITH ASSISTANCE Description: STG Decreased Risk of Fall With Mod I Assistance. Outcome: Progressing   Problem: RH KNOWLEDGE DEFICIT GENERAL Goal: RH STG INCREASE KNOWLEDGE OF SELF CARE AFTER HOSPITALIZATION Description: Patient will demonstrate knowledge of pain management, skin/wound care management, safety awareness with educational materials and handouts provided by staff during rehabilitation admission.. Outcome: Progressing

## 2023-06-01 NOTE — Progress Notes (Signed)
Physical Therapy Session Note  Patient Details  Name: Jeremy [name] MRN: [id number] Date of Birth: 1936-02-16  Today's Date: 06/01/2023 PT Individual Time: 4401-0272 + 1340 - 1435 PT Individual Time Calculation (min): 40 min + 55 min  Short Term Goals: Week 1:  PT Short Term Goal 1 (Week 1): pt will navigate stairs x 2 with CGA PT Short Term Goal 2 (Week 1): Pt will ambulate x 100 ft with LRAD  Skilled Therapeutic Interventions/Progress Updates:    Session 1: Chart reviewed and pt agreeable to therapy. Pt received semi-reclined in bed with no c/o pain. Session focused on long amb, rollator safety, and balance to promote safe home access. Pt initiated session with amb for 15 mins for >1021ft with 2 seated rest breaks using S + rollator. Pt tasked with safe sitting using rollator which pt completed with maxA for VC on first attempt and minA for VC on second attempt. Pt then completed navigation around cones using S + rollator with pt displaying good control of rollator during turns. Pt then completed series of balance exercises including neutral stance, narrow stance, neutral stance with eyes closed, forward reaches, head turns, and staggered stance all with close S + no AD. Session education emphasized continuum of care and continued balance training across the lifespan. At end of session, pt requested toileting. Wife previously cleared to assist with toilet transfers and toileting per safety plan, so pt left on toilet in care of wife.   Session 2: Chart reviewed and pt agreeable to therapy. Pt received reclined in bed with no c/o pain. Session focused on long amb in community spaces and stairs to promote safe home access and balance to decrease fall risk. Pt initiated session with amb to toilet using S + rollator. Pt then completed amb of >1053ft for 20 mins using S + rollator with good rollator control and safe sitting technique for 2 seated breaks. Pt then completed stair navigation of 8 steps with  R ascending rail using close S. Pt then continued amb to first floor and navigated community space using S + rollator. Pt noted to continue safe sitting practice with rollator. Pt then returned to therapy gym with same assist level. In gym, pt completed series of balance exercises including B SLS with CGA, B SLS with ball hold using CGA for LLE and MinA for RLE, and B SLS with ball toss between hands using CGA for LLE and MinA for RLE. Pt then returned to room with same amb assist level. In room, pt provided education on balance training across the lifespan.  At end of session, pt was left semi-reclined in bed with alarm engaged, nurse call bell and all needs in reach.    Therapy Documentation Precautions:  Precautions Precautions: Fall Restrictions Weight Bearing Restrictions Per Provider Order: Yes RLE Weight Bearing Per Provider Order: Weight bearing as tolerated General:       Therapy/Group: Individual Therapy  Dionne Milo, PT, DPT 06/01/2023, 12:06 PM

## 2023-06-01 NOTE — Progress Notes (Signed)
Occupational Therapy Session Note  Patient Details  Name: Jeremy [name] MRN: [id number] Date of Birth: 05/24/1936  Today's Date: 06/01/2023 OT Individual Time: 0700-0739 OT Individual Time Calculation (min): 39 min  and Today's Date: 06/01/2023 OT Missed Time: 21 Minutes Missed Time Reason: Patient unwilling/refused to participate without medical reason   Short Term Goals: Week 2:  OT Short Term Goal 1 (Week 2): STG=LTG 2/2 ELOS  Skilled Therapeutic Interventions/Progress Updates:    Pt resting in bed upon arrival. Pt initially agreeable to sitting EOB to eat breakfast. Supine>sit EOB with supervision. Pt ate some of his fruit but did not eat or drink anything else. While eating, pt informed pt that he didn't want to do anything today. Pt denied pain but again stated he didn't want to do anything today. Pt admitted that he was anxious about discharging home on 12/30. Pt agreed that he was doing well from mobility and ADL standpoint but he was just anxious about "the situation." Pt agreeable to washing up and changing clothing with sit<>stand from EOB. Pt requires assistance with LB dressing and bathing tasks but otherwise completes all tasks at supervision/mod I level. AFter dressing, pt again states he wasn't gong to do anything today and returned to supine in bed. Bed alarm activated and all needs within reach. Pt missed 21 mins skilled OT services. Will attempt to see pt later in day as schedule allows.  Therapy Documentation Precautions:  Precautions Precautions: Fall Restrictions Weight Bearing Restrictions Per Provider Order: Yes RLE Weight Bearing Per Provider Order: Weight bearing as tolerated General: General OT Amount of Missed Time: 21 Minutes    Pain:  Pt denies pain this morning   Therapy/Group: Individual Therapy  Rich Brave 06/01/2023, 7:40 AM

## 2023-06-01 NOTE — Progress Notes (Signed)
PROGRESS NOTE   Subjective/Complaints:  Pt doing ok, says he slept ok but also states he's tired and doesn't really want to do much today. Pain doing ok. LBM a couple days ago but per documentation LBM was yesterday or maybe the day before (yesterday's was flushed before viewing). Urinating ok. Denies any other complaints or concerns today.   ROS: Patient denies CP, SOB, abd pain, n/v/d.  Objective:   No results found.  Recent Labs    05/30/23 0532 05/31/23 0742  WBC 7.5 7.3  HGB 8.4* 8.6*  HCT 25.5* 26.2*  PLT 439* 515*   No results for input(s): "NA", "K", "CL", "CO2", "GLUCOSE", "BUN", "CREATININE", "CALCIUM" in the last 72 hours.   Intake/Output Summary (Last 24 hours) at 06/01/2023 1058 Last data filed at 05/31/2023 1806 Gross per 24 hour  Intake 120 ml  Output --  Net 120 ml        Physical Exam: Vital Signs Blood pressure (!) 140/66, pulse 100, temperature 98.1 F (36.7 C), resp. rate 18, height 5\' 6"  (1.676 m), weight 54.1 kg, SpO2 98%.      Constitutional: No distress . Vital signs reviewed. Laying in bed.  HEENT: NCAT, EOMI, oral membranes moist Neck: supple Cardiovascular: RRR without murmur. No JVD    Respiratory/Chest: CTA Bilaterally without wheezes or rales. Normal effort    GI/Abdomen: soft BS +, non-tender, non-distended Ext: no clubbing, cyanosis, or edema Psych: flat but cooperative  PRIOR EXAMS: Neurological: Ox3 Musculoskeletal:     Cervical back: Neck supple. No tenderness.     Right lower leg: sl thigh swelling. Hip tender to palpation     Left lower leg: No edema.     Comments: Ue's 5-/5 B/L RLE- HF 2/5; DF/PF 4+/5 LLE- 5-/5 throughout  Skin:    General: hydrocolloid dressing in place, old blood on dressing--no spread  Assessment/Plan: 1. Functional deficits which require 3+ hours per day of interdisciplinary therapy in a comprehensive inpatient rehab  setting. Physiatrist is providing close team supervision and 24 hour management of active medical problems listed below. Physiatrist and rehab team continue to assess barriers to discharge/monitor patient progress toward functional and medical goals  Care Tool:  Bathing    Body parts bathed by patient: Right arm, Left arm, Chest, Abdomen, Front perineal area, Buttocks, Right upper leg, Left upper leg, Face   Body parts bathed by helper: Right lower leg, Left lower leg     Bathing assist Assist Level: Minimal Assistance - Patient > 75%     Upper Body Dressing/Undressing Upper body dressing   What is the patient wearing?: Pull over shirt    Upper body assist Assist Level: Independent with assistive device    Lower Body Dressing/Undressing Lower body dressing      What is the patient wearing?: Underwear/pull up, Pants     Lower body assist Assist for lower body dressing: Minimal Assistance - Patient > 75%     Toileting Toileting    Toileting assist Assist for toileting: Independent with assistive device     Transfers Chair/bed transfer  Transfers assist     Chair/bed transfer assist level: Contact Guard/Touching assist     Locomotion  Ambulation   Ambulation assist      Assist level: Contact Guard/Touching assist Assistive device: Walker-rolling Max distance: 20   Walk 10 feet activity   Assist     Assist level: Contact Guard/Touching assist Assistive device: Walker-rolling   Walk 50 feet activity   Assist Walk 50 feet with 2 turns activity did not occur: Safety/medical concerns         Walk 150 feet activity   Assist Walk 150 feet activity did not occur: Safety/medical concerns         Walk 10 feet on uneven surface  activity   Assist Walk 10 feet on uneven surfaces activity did not occur: Safety/medical concerns         Wheelchair     Assist Is the patient using a wheelchair?: Yes Type of Wheelchair: Manual     Wheelchair assist level: Supervision/Verbal cueing Max wheelchair distance: 10    Wheelchair 50 feet with 2 turns activity    Assist        Assist Level: Total Assistance - Patient < 25% (Patient propelled 10 ft (20%))   Wheelchair 150 feet activity     Assist      Assist Level: Total Assistance - Patient < 25% (Patient propelled 61ft)   Blood pressure (!) 140/66, pulse 100, temperature 98.1 F (36.7 C), resp. rate 18, height 5\' 6"  (1.676 m), weight 54.1 kg, SpO2 98%.    Medical Problem List and Plan: 1. Functional deficits secondary to R hip fx s/p THA WBAT             -patient may  shower- cover incision/dressing             -ELOS/Goals: 14-18 days supervision, goal 12/30     Grounds pass ordered  added Scheduled pain meds for therapy since it's the most limiting issues -Continue CIR therapies including PT, OT  -discussed ortho f/u with wife 2.  Antithrombotics: -DVT/anticoagulation:  Pharmaceutical: continue Lovenox 40mg  daily             -antiplatelet therapy: plan aspirin 81 mg BID at discharge             -05/25/23 BLE venous duplex negative   3. Pain Management: Tylenol 650 mg q 6 hours -Robaxin as needed -oxycodone as needed -12/20- pain 7/10 this AM- asked nursing to give pain meds- pt feels meds work when takes them 12/23- pain "OK"- con't regimen 12/26-27- pt receiving scheduled Oxy 10 mg with 6:30 am and noon which seems to be helping with therapy tolerance 4. Mood/Behavior/Sleep: LCSW to evaluate and provide emotional support             -continue Abilify 2 mg q HS             -continue Cymbalta DR 120 mg daily             -continue Remeron 30 mg q HS             -antipsychotic agents: n/a   12/20- no changes to depression meds- is chronic dosing 5. Neuropsych/cognition: This patient is capable of making decisions on his own behalf.   6. Skin/Wound Care: Routine skin care checks             -monitor surgical incision (has surgical staples)    12/26 continue post-op dressing until time for staples to come out ~12/31 can remove day of dc 12/30 7. Fluids/Electrolytes/Nutrition: Routine Is and Os and follow-up chemistries   12/20-  labs look better- Na up to 134-  8: BPH: continue Proscar 5 mg q AM and Flomax 0.4 mg q 48 hours   12/23- voiding well per pt- intermittent incontinence-  9: Right hip displaced subcapital femoral neck fracture s/p right direct anterior total hip arthroplasty 05/21/2023 by Dr. Magnus Ivan             -WBAT             -follow-up in ~ 2 weeks   10: ABLA/iron deficiency:              -consider oral iron supplementation             -follow-up CBC   12/27 hgb up sl today to 8.5   -continue iron supp   -check stool for OB x 1   -recheck cbc Monday  11: Hyponatremia: follow-up BMP   12/20- up to 134 12: Leukocytosis: resolved 13. Hypotension D/c proscar -06/01/23 BP doing better, monitor Vitals:   05/28/23 1420 05/28/23 1954 05/29/23 0413 05/29/23 1315  BP: (!) 107/57 (!) 101/59 (!) 103/48 120/65   05/29/23 1922 05/30/23 0510 05/30/23 1518 05/30/23 1902  BP: (!) 102/59 (!) 105/59 (!) 99/56 (!) 108/53   05/31/23 0509 05/31/23 1600 05/31/23 2027 06/01/23 0232  BP: (!) 141/71 135/75 106/61 (!) 140/66    14. Constipation:  -05/25/23 unclear when LBM was, maybe 1-2 days ago, on colace 100mg  BID, senna 8.6mg  daily, and miralax qM/W/F; monitor if no BM this weekend then may need to adjust meds.  -05/26/23 still no BM, will increase miralax to daily including today, monitor, may need sorbitol tomorrow if no BM  12/23- 3 small BM's in last 24 hours- con't miralax  12/24- 3 BM's yesterday- too loose- will stop Colace and con't Miralax. Esp since going to be taking pain meds scheduled.   12/27: had bm 12/26   -now off miralax   -stools seem to be slowing/normalizing--no changes today  -06/01/23 LBM either yesterday or the day before, yesterday's was flushed before staff viewed (so unclear if there was much);  monitor for now   LOS: 9 days A FACE TO [facility] EVALUATION WAS PERFORMED  [address] 06/01/2023, 10:58 AM

## 2023-06-02 DIAGNOSIS — K5901 Slow transit constipation: Secondary | ICD-10-CM | POA: Diagnosis not present

## 2023-06-02 DIAGNOSIS — S72001A Fracture of unspecified part of neck of right femur, initial encounter for closed fracture: Secondary | ICD-10-CM | POA: Diagnosis not present

## 2023-06-02 DIAGNOSIS — I10 Essential (primary) hypertension: Secondary | ICD-10-CM

## 2023-06-02 NOTE — Progress Notes (Signed)
Occupational Therapy Session Note  Patient Details  Name: Jeremy [name] MRN: [id number] Date of Birth: February 09, 1936  Today's Date: 06/02/2023 OT Individual Time: 0920-1000 OT Individual Time Calculation (min): 40 min   Today's Date: 06/02/2023 OT Individual Time: 8469-6295 OT Individual Time Calculation (min): 40 min   Short Term Goals: Week 2:  OT Short Term Goal 1 (Week 2): STG=LTG 2/2 ELOS  Skilled Therapeutic Interventions/Progress Updates:   Session 1: Pt greeted resting in bed for skilled OT session with focus on caregiver education and dynamic standing balance/activity tolerance.   Pain: Pt reported 7/10 pain in R thigh, stating ". . . It particularly hurts in the morning. . .". OT offering intermediate rest breaks and positioning suggestions throughout session to address pain/fatigue and maximize participation/safety in session.   Functional Transfers: Sit<>stands and ambulatory transfers with use of rollator + close supervision, VC for recall to lock brakes upon sitting/standing.   Self Care Tasks: Spouse requesting unofficial caregiver education in regards to walk-in shower transfer. Time dedicated to simulating home-environment and performing ambulatory transfer with rollator + close supervision. Education/handouts provided on shower chair and suction-cup grab bars.   Therapeutic Exercise/Activities: Pt performs 2x15 reps of step ups onto 6in step for dynamic standing balance and activity tolerance challenge. Pt progresses to no UE support during second repetition.    Education: Pt and OT review information about "Silver Sneakers" program for continued physical and social health.   Pt remained resting in bed with 4Ps assessed and immediate needs met. Pt continues to be appropriate for skilled OT intervention to promote further functional independence in ADLs/IADLs.   Session 2: Pt greeted resting in bed for skilled OT session with focus on BUE HEP and discharge planning.    Pain: Pt with no reports of pain,OT offering intermediate rest breaks and positioning suggestions throughout session to address pain/fatigue and maximize participation/safety in session.   Functional Transfers: Sit<>stands and ambulatory transfers with use of rollator + close supervision, less VC for recall to lock brakes upon sitting/standing required this session.   Therapeutic Exercise: In main therapy gym, pt participates in series of UE strengthening exercises:  -Overhead Ab/adduction -Chest-leve Ab/adduction -Shoulder Flexion/Extension -Diagonal Ab/adduction  -Tricep Extensions -Bicep curls -Punches   Pt completes 1 set/10 reps of each exercise with yellow theraband, requiring multi-modal cuing for correct form. HEP provided.   Pt remained resting in bed with 4Ps assessed and immediate needs met. Pt continues to be appropriate for skilled OT intervention to promote further functional independence in ADLs/IADLs.   Therapy Documentation Precautions:  Precautions Precautions: Fall Restrictions Weight Bearing Restrictions Per Provider Order: Yes RLE Weight Bearing Per Provider Order: Weight bearing as tolerated   Therapy/Group: Individual Therapy  Lou Cal, OTR/L, MSOT  06/02/2023, 6:24 AM

## 2023-06-02 NOTE — Progress Notes (Signed)
Physical Therapy Session Note  Patient Details  Name: Jeremy [name] MRN: [id number] Date of Birth: 04-06-1936  Today's Date: 06/02/2023 PT Individual Time: 1100-1155 PT Individual Time Calculation (min): 55 min   Short Term Goals: Week 1:  PT Short Term Goal 1 (Week 1): pt will navigate stairs x 2 with CGA PT Short Term Goal 2 (Week 1): Pt will ambulate x 100 ft with LRAD  Skilled Therapeutic Interventions/Progress Updates:  Pt was seen bedside in the am. Pt performed bed mobility I with increased time. Pt performed sit to stand and stand pivot transfers with rollator independently. Pt performed car transfers with S. Pt ascended/descended 12 stairs with 1 rail and S. Pt ambulated multiple distances with S and rollator. Pt rode recumbant bike at level 2 for 5 minutes x 2 for flexibility and endurance. Pt returned to room following treatment and left sitting up in bed with wife at bedside.   Therapy Documentation Precautions:  Precautions Precautions: Fall Restrictions Weight Bearing Restrictions Per Provider Order: Yes RLE Weight Bearing Per Provider Order: Weight bearing as tolerated General:   Pain: No c/o pain.     Therapy/Group: Individual Therapy  Rayford Halsted 06/02/2023, 12:22 PM

## 2023-06-02 NOTE — Progress Notes (Signed)
PROGRESS NOTE   Subjective/Complaints:  Pt doing well, better today. Denies any pain. LBM today. Urinating ok. Denies any other complaints or concerns today.   ROS: Patient denies CP, SOB, abd pain, n/v/d.  Objective:   No results found.  Recent Labs    05/31/23 0742  WBC 7.3  HGB 8.6*  HCT 26.2*  PLT 515*   No results for input(s): "NA", "K", "CL", "CO2", "GLUCOSE", "BUN", "CREATININE", "CALCIUM" in the last 72 hours.   Intake/Output Summary (Last 24 hours) at 06/02/2023 1115 Last data filed at 06/02/2023 0830 Gross per 24 hour  Intake 796 ml  Output --  Net 796 ml        Physical Exam: Vital Signs Blood pressure (!) 93/55, pulse 74, temperature 98 F (36.7 C), resp. rate 18, height 5\' 6"  (1.676 m), weight 54.1 kg, SpO2 99%.      Constitutional: No distress . Vital signs reviewed. Laying in bed with wife at bedside.  HEENT: NCAT, EOMI, oral membranes moist Neck: supple Cardiovascular: RRR without murmur. No JVD    Respiratory/Chest: CTA Bilaterally without wheezes or rales. Normal effort    GI/Abdomen: soft BS +, non-tender, non-distended Ext: no clubbing, cyanosis, or edema Psych: flat but cooperative Skin:    General: R hip hydrocolloid dressing in place, no bleeding surrounding dressing, c/d/i  PRIOR EXAMS: Neurological: Ox3 Musculoskeletal:     Cervical back: Neck supple. No tenderness.     Right lower leg: sl thigh swelling. Hip tender to palpation     Left lower leg: No edema.     Comments: Ue's 5-/5 B/L RLE- HF 2/5; DF/PF 4+/5 LLE- 5-/5 throughout    Assessment/Plan: 1. Functional deficits which require 3+ hours per day of interdisciplinary therapy in a comprehensive inpatient rehab setting. Physiatrist is providing close team supervision and 24 hour management of active medical problems listed below. Physiatrist and rehab team continue to assess barriers to discharge/monitor patient  progress toward functional and medical goals  Care Tool:  Bathing    Body parts bathed by patient: Right arm, Left arm, Chest, Abdomen, Front perineal area, Buttocks, Right upper leg, Left upper leg, Face   Body parts bathed by helper: Right lower leg, Left lower leg     Bathing assist Assist Level: Minimal Assistance - Patient > 75%     Upper Body Dressing/Undressing Upper body dressing   What is the patient wearing?: Pull over shirt    Upper body assist Assist Level: Independent with assistive device    Lower Body Dressing/Undressing Lower body dressing      What is the patient wearing?: Underwear/pull up, Pants     Lower body assist Assist for lower body dressing: Minimal Assistance - Patient > 75%     Toileting Toileting    Toileting assist Assist for toileting: Independent with assistive device     Transfers Chair/bed transfer  Transfers assist     Chair/bed transfer assist level: Contact Guard/Touching assist     Locomotion Ambulation   Ambulation assist      Assist level: Contact Guard/Touching assist Assistive device: Walker-rolling Max distance: 20   Walk 10 feet activity   Assist  Assist level: Contact Guard/Touching assist Assistive device: Walker-rolling   Walk 50 feet activity   Assist Walk 50 feet with 2 turns activity did not occur: Safety/medical concerns         Walk 150 feet activity   Assist Walk 150 feet activity did not occur: Safety/medical concerns         Walk 10 feet on uneven surface  activity   Assist Walk 10 feet on uneven surfaces activity did not occur: Safety/medical concerns         Wheelchair     Assist Is the patient using a wheelchair?: Yes Type of Wheelchair: Manual    Wheelchair assist level: Supervision/Verbal cueing Max wheelchair distance: 10    Wheelchair 50 feet with 2 turns activity    Assist        Assist Level: Total Assistance - Patient < 25% (Patient  propelled 10 ft (20%))   Wheelchair 150 feet activity     Assist      Assist Level: Total Assistance - Patient < 25% (Patient propelled 74ft)   Blood pressure (!) 93/55, pulse 74, temperature 98 F (36.7 C), resp. rate 18, height 5\' 6"  (1.676 m), weight 54.1 kg, SpO2 99%.    Medical Problem List and Plan: 1. Functional deficits secondary to R hip fx s/p THA WBAT             -patient may  shower- cover incision/dressing             -ELOS/Goals: 14-18 days supervision, goal 12/30     Grounds pass ordered  added Scheduled pain meds for therapy since it's the most limiting issues -Continue CIR therapies including PT, OT  -discussed ortho f/u with wife -06/02/23 pt and wife with many questions regarding d/c tomorrow; explained that weekday team will be by tomorrow morning to discuss details of outpatient f/up and discharge planning. They want to make sure his staples are removed and any dressing changes they need to do needs to be explained to them, please.  2.  Antithrombotics: -DVT/anticoagulation:  Pharmaceutical: continue Lovenox 40mg  daily             -antiplatelet therapy: plan aspirin 81 mg BID at discharge             -05/25/23 BLE venous duplex negative   3. Pain Management: Tylenol 650 mg q 6 hours -Robaxin as needed -oxycodone as needed -12/20- pain 7/10 this AM- asked nursing to give pain meds- pt feels meds work when takes them 12/23- pain "OK"- con't regimen 12/26-27- pt receiving scheduled Oxy 10 mg with 6:30 am and noon which seems to be helping with therapy tolerance 4. Mood/Behavior/Sleep: LCSW to evaluate and provide emotional support             -continue Abilify 2 mg q HS             -continue Cymbalta DR 120 mg daily             -continue Remeron 30 mg q HS             -antipsychotic agents: n/a   12/20- no changes to depression meds- is chronic dosing 5. Neuropsych/cognition: This patient is capable of making decisions on his own behalf.   6. Skin/Wound  Care: Routine skin care checks             -monitor surgical incision (has surgical staples) -12/26 continue post-op dressing until time for staples to come out ~12/31 can  remove day of dc 12/30-- reviewed this with them today 12/29, wife wants review of dressing changes if any are needed 7. Fluids/Electrolytes/Nutrition: Routine Is and Os and follow-up chemistries   12/20- labs look better- Na up to 134-  8: BPH: continue Proscar 5 mg q AM and Flomax 0.4 mg q 48 hours   12/23- voiding well per pt- intermittent incontinence-  9: Right hip displaced subcapital femoral neck fracture s/p right direct anterior total hip arthroplasty 05/21/2023 by Dr. Magnus Ivan             -WBAT             -follow-up in ~ 2 weeks   10: ABLA/iron deficiency:              -consider oral iron supplementation             -follow-up CBC   12/27 hgb up sl today to 8.5   -continue iron supp   -check stool for OB x 1   -recheck cbc Monday  11: Hyponatremia: follow-up BMP   12/20- up to 134 12: Leukocytosis: resolved 13. Hypotension D/c proscar -06/02/23 BP doing better mostly, monitor Vitals:   05/29/23 0413 05/29/23 1315 05/29/23 1922 05/30/23 0510  BP: (!) 103/48 120/65 (!) 102/59 (!) 105/59   05/30/23 1518 05/30/23 1902 05/31/23 0509 05/31/23 1600  BP: (!) 99/56 (!) 108/53 (!) 141/71 135/75   05/31/23 2027 06/01/23 0232 06/01/23 1535 06/01/23 1921  BP: 106/61 (!) 140/66 113/61 (!) 93/55    14. Constipation:  -05/25/23 unclear when LBM was, maybe 1-2 days ago, on colace 100mg  BID, senna 8.6mg  daily, and miralax qM/W/F; monitor if no BM this weekend then may need to adjust meds.  -05/26/23 still no BM, will increase miralax to daily including today, monitor, may need sorbitol tomorrow if no BM  12/23- 3 small BM's in last 24 hours- con't miralax  12/24- 3 BM's yesterday- too loose- will stop Colace and con't Miralax. Esp since going to be taking pain meds scheduled.   12/27: had bm 12/26   -now off  miralax   -stools seem to be slowing/normalizing--no changes today -06/01/23 LBM either yesterday or the day before, yesterday's was flushed before staff viewed (so unclear if there was much); monitor for now -06/02/23 LBM this morning, cont monitoring   LOS: 10 days A FACE TO FACE EVALUATION WAS PERFORMED  [address] 06/02/2023, 11:15 AM

## 2023-06-02 NOTE — Plan of Care (Signed)
°  Problem: RH Balance Goal: LTG Patient will maintain dynamic standing with ADLs (OT) Description: LTG:  Patient will maintain dynamic standing balance with assist during activities of daily living (OT)  Outcome: Adequate for Discharge Note: Supervision and verbal cuing required at times for safe rollator management.    Problem: Sit to Stand Goal: LTG:  Patient will perform sit to stand in prep for activites of daily living with assistance level (OT) Description: LTG:  Patient will perform sit to stand in prep for activites of daily living with assistance level (OT) Outcome: Completed/Met   Problem: RH Eating Goal: LTG Patient will perform eating w/assist, cues/equip (OT) Description: LTG: Patient will perform eating with assist, with/without cues using equipment (OT) Outcome: Completed/Met   Problem: RH Grooming Goal: LTG Patient will perform grooming w/assist,cues/equip (OT) Description: LTG: Patient will perform grooming with assist, with/without cues using equipment (OT) Outcome: Completed/Met   Problem: RH Bathing Goal: LTG Patient will bathe all body parts with assist levels (OT) Description: LTG: Patient will bathe all body parts with assist levels (OT) Outcome: Completed/Met   Problem: RH Dressing Goal: LTG Patient will perform upper body dressing (OT) Description: LTG Patient will perform upper body dressing with assist, with/without cues (OT). Outcome: Completed/Met Goal: LTG Patient will perform lower body dressing w/assist (OT) Description: LTG: Patient will perform lower body dressing with assist, with/without cues in positioning using equipment (OT) Outcome: Completed/Met   Problem: RH Toileting Goal: LTG Patient will perform toileting task (3/3 steps) with assistance level (OT) Description: LTG: Patient will perform toileting task (3/3 steps) with assistance level (OT)  Outcome: Completed/Met   Problem: RH Toilet Transfers Goal: LTG Patient will perform toilet  transfers w/assist (OT) Description: LTG: Patient will perform toilet transfers with assist, with/without cues using equipment (OT) Outcome: Completed/Met

## 2023-06-03 ENCOUNTER — Encounter: Payer: Self-pay | Admitting: Orthopaedic Surgery

## 2023-06-03 ENCOUNTER — Ambulatory Visit (INDEPENDENT_AMBULATORY_CARE_PROVIDER_SITE_OTHER): Payer: Medicare Other | Admitting: Orthopaedic Surgery

## 2023-06-03 ENCOUNTER — Telehealth: Payer: Self-pay | Admitting: Orthopaedic Surgery

## 2023-06-03 ENCOUNTER — Other Ambulatory Visit (HOSPITAL_COMMUNITY): Payer: Self-pay

## 2023-06-03 DIAGNOSIS — S72001A Fracture of unspecified part of neck of right femur, initial encounter for closed fracture: Secondary | ICD-10-CM | POA: Diagnosis not present

## 2023-06-03 DIAGNOSIS — Z96641 Presence of right artificial hip joint: Secondary | ICD-10-CM

## 2023-06-03 LAB — BASIC METABOLIC PANEL
Anion gap: 8 (ref 5–15)
BUN: 7 mg/dL — ABNORMAL LOW (ref 8–23)
CO2: 24 mmol/L (ref 22–32)
Calcium: 8.7 mg/dL — ABNORMAL LOW (ref 8.9–10.3)
Chloride: 106 mmol/L (ref 98–111)
Creatinine, Ser: 0.67 mg/dL (ref 0.61–1.24)
GFR, Estimated: >60 mL/min (ref >60)
Glucose, Bld: 95 mg/dL (ref 70–99)
Potassium: 3.4 mmol/L — ABNORMAL LOW (ref 3.5–5.1)
Sodium: 138 mmol/L (ref 135–145)

## 2023-06-03 LAB — CBC
HCT: 26.3 % — ABNORMAL LOW (ref 39.0–52.0)
Hemoglobin: 8.7 g/dL — ABNORMAL LOW (ref 13.0–17.0)
MCH: 30.6 pg (ref 26.0–34.0)
MCHC: 33.1 g/dL (ref 30.0–36.0)
MCV: 92.6 fL (ref 80.0–100.0)
Platelets: 539 10*3/uL — ABNORMAL HIGH (ref 150–400)
RBC: 2.84 MIL/uL — ABNORMAL LOW (ref 4.22–5.81)
RDW: 13.4 % (ref 11.5–15.5)
WBC: 6.8 10*3/uL (ref 4.0–10.5)
nRBC: 0 % (ref 0.0–0.2)

## 2023-06-03 MED ORDER — OXYCODONE HCL 10 MG PO TABS
10.0000 mg | ORAL_TABLET | Freq: Two times a day (BID) | ORAL | 0 refills | Status: AC
  Filled 2023-06-03: qty 14, 7d supply, fill #0

## 2023-06-03 MED ORDER — SENNA 8.6 MG PO TABS: 1.0000 | ORAL_TABLET | Freq: Every day | ORAL | Status: AC

## 2023-06-03 MED ORDER — TAMSULOSIN HCL 0.4 MG PO CAPS: 0.4000 mg | ORAL_CAPSULE | ORAL | Status: AC

## 2023-06-03 MED ORDER — ACETAMINOPHEN 325 MG PO TABS: 325.0000 mg | ORAL_TABLET | ORAL | Status: AC | PRN

## 2023-06-03 MED ORDER — POTASSIUM CHLORIDE CRYS ER 20 MEQ PO TBCR
40.0000 meq | EXTENDED_RELEASE_TABLET | Freq: Once | ORAL | Status: AC
  Administered 2023-06-03: 40 meq via ORAL
  Filled 2023-06-03: qty 2

## 2023-06-03 MED ORDER — FE FUM-VIT C-VIT B12-FA 460-60-0.01-1 MG PO CAPS
1.0000 | ORAL_CAPSULE | Freq: Every day | ORAL | 0 refills | Status: AC
  Filled 2023-06-03: qty 30, 30d supply, fill #0

## 2023-06-03 NOTE — Progress Notes (Signed)
Patient ID: Jeremy [name], male   DOB: 1935-10-30, 87 y.o.   MRN: [id number]  INPATIENT REHABILITATION DISCHARGE NOTE   Discharge instructions by: Wendi Maya, PA-C  Verbalized understanding: yes  Skin care/Wound care healing: staples removed from incision. No drainage noted.  Pain: none reported  IV's: n/a  Tubes/Drains: n/a  O2: n/a  Safety instructions: given  Patient belongings: returned  Discharged to: home  Discharged via: spouse

## 2023-06-03 NOTE — Progress Notes (Signed)
Inpatient Rehabilitation Discharge Medication Review by a Pharmacist  A complete drug regimen review was completed for this patient to identify any potential clinically significant medication issues.  High Risk Drug Classes Is patient taking? Indication by Medication  Antipsychotic Yes Aripiprazole - mood  Anticoagulant No    Antibiotic No   Opioid Yes Oxycodone - pain  Antiplatelet No   Hypoglycemics/insulin No   Vasoactive Medication No   Chemotherapy No   Other Yes Duloxetine, Mirtazapine - MDD Lorazepam PRN- anxiety  Methocarbamol prn spasms Senokot- constipation   Tamsulosin - BPH     Type of Medication Issue Identified Description of Issue Recommendation(s)  Drug Interaction(s) (clinically significant)     Duplicate Therapy     Allergy     No Medication Administration End Date     Incorrect Dose     Additional Drug Therapy Needed     Significant med changes from prior encounter (inform family/care partners about these prior to discharge). STOP: bASA BID, finasteride, risperdal     Other       Clinically significant medication issues were identified that warrant physician communication and completion of prescribed/recommended actions by midnight of the next day:  No  Pharmacist comments: None  Time spent performing this drug regimen review (minutes):  20 minutes  Jani Gravel, PharmD Clinical Pharmacist  06/03/2023 8:53 AM

## 2023-06-03 NOTE — Telephone Encounter (Signed)
Patient called and needs to be fit in for post op

## 2023-06-03 NOTE — Progress Notes (Signed)
Inpatient Rehabilitation Care Coordinator Discharge Note   Patient Details  Name: Jeremy [name] MRN: [id number] Date of Birth: [birth year]   Discharge location: D/c tohome with wife  Length of Stay: 10 days  Discharge activity level: Supervision  Home/community participation: Limited  Patient response WU:[id number] Literacy - How often do you need to have someone help you when you read instructions, pamphlets, or other written material from your doctor or pharmacy?: Never  Patient response YN:WGNFAO Isolation - How often do you feel lonely or isolated from those around you?: Rarely  Services provided included: MD, RD, PT, OT, RN, CM, Pharmacy, Neuropsych, SW, TR  Financial Services:  Financial Services Utilized: Medicare    Choices offered to/list presented to: patient and pt wife  Follow-up services arranged:  Outpatient, DME    Outpatient Servicies: [facility] for PT/OT DME : Wife to purchase shower chair    Patient response to transportation need: Is the patient able to respond to transportation needs?: Yes In the past 12 months, has lack of transportation kept you from medical appointments or from getting medications?: No In the past 12 months, has lack of transportation kept you from meetings, work, or from getting things needed for daily living?: No   Patient/Family verbalized understanding of follow-up arrangements:  Yes  Individual responsible for coordination of the follow-up plan: contact pt [phone]  or pt wife#[phone]  Confirmed correct DME delivered: Gretchen Short 06/03/2023    Comments (or additional information):fam edu completed  Summary of Stay    Date/Time Discharge Planning CSW  05/27/23 1520 Pt to discharge to home with his wife will be his primary caregiver. SW will confirm there are no barriers to discharge. AAC       Marthe Dant A Lula Olszewski

## 2023-06-03 NOTE — Progress Notes (Signed)
PROGRESS NOTE   Subjective/Complaints:  Pt reports he wants staples out before d/c today.  Had some dark stools yesterday, but nurse didn't feel were black and so didn't test for blood- also, Hb stable.   K+ 3.4 this AM   ROS:  Pt denies SOB, abd pain, CP, N/V/C/D, and vision changes  Objective:   No results found.  Recent Labs    06/03/23 0503  WBC 6.8  HGB 8.7*  HCT 26.3*  PLT 539*   Recent Labs    06/03/23 0503  NA 138  K 3.4*  CL 106  CO2 24  GLUCOSE 95  BUN 7*  CREATININE 0.67  CALCIUM 8.7*     Intake/Output Summary (Last 24 hours) at 06/03/2023 0818 Last data filed at 06/02/2023 1800 Gross per 24 hour  Intake 595 ml  Output --  Net 595 ml        Physical Exam: Vital Signs Blood pressure 136/73, pulse 74, temperature 97.9 F (36.6 C), resp. rate 18, height 5\' 6"  (1.676 m), weight 54.1 kg, SpO2 98%.     General: awake, alert, appropriate, flat affect; walked to bed and laid down with no issues; wife at bedside;  NAD HENT: conjugate gaze; oropharynx moist CV: regular rate and rhythm; no JVD Pulmonary: CTA B/L; no W/R/R- good air movement GI: soft, NT, ND, (+)BS Psychiatric: very flat affect Neurological: Ox3 Skin: staples look great- took off surgical dressing- incision no drainage, no erythema; very little swelling Ext: no clubbing, cyanosis, or edema Psych: flat but cooperative Skin:    General: R hip hydrocolloid dressing in place, no bleeding surrounding dressing, c/d/i  PRIOR EXAMS: Neurological: Ox3 Musculoskeletal:     Cervical back: Neck supple. No tenderness.     Right lower leg: sl thigh swelling. Hip tender to palpation     Left lower leg: No edema.     Comments: Ue's 5-/5 B/L RLE- HF 2/5; DF/PF 4+/5 LLE- 5-/5 throughout    Assessment/Plan: 1. Functional deficits which require 3+ hours per day of interdisciplinary therapy in a comprehensive inpatient rehab  setting. Physiatrist is providing close team supervision and 24 hour management of active medical problems listed below. Physiatrist and rehab team continue to assess barriers to discharge/monitor patient progress toward functional and medical goals  Care Tool:  Bathing    Body parts bathed by patient: Right arm, Left arm, Chest, Abdomen, Front perineal area, Buttocks, Right upper leg, Left upper leg, Face   Body parts bathed by helper: Right lower leg, Left lower leg     Bathing assist Assist Level: Minimal Assistance - Patient > 75%     Upper Body Dressing/Undressing Upper body dressing   What is the patient wearing?: Pull over shirt    Upper body assist Assist Level: Independent with assistive device    Lower Body Dressing/Undressing Lower body dressing      What is the patient wearing?: Underwear/pull up, Pants     Lower body assist Assist for lower body dressing: Minimal Assistance - Patient > 75%     Toileting Toileting    Toileting assist Assist for toileting: Independent with assistive device     Transfers Chair/bed transfer  Transfers assist     Chair/bed transfer assist level: Independent with assistive device Chair/bed transfer assistive device: Arboriculturist assist      Assist level: Supervision/Verbal cueing Assistive device: Rollator Max distance: 300   Walk 10 feet activity   Assist     Assist level: Supervision/Verbal cueing Assistive device: Rollator   Walk 50 feet activity   Assist Walk 50 feet with 2 turns activity did not occur: Safety/medical concerns  Assist level: Supervision/Verbal cueing Assistive device: Rollator    Walk 150 feet activity   Assist Walk 150 feet activity did not occur: Safety/medical concerns  Assist level: Supervision/Verbal cueing Assistive device: Rollator    Walk 10 feet on uneven surface  activity   Assist Walk 10 feet on uneven surfaces activity did  not occur: Safety/medical concerns   Assist level: Contact Guard/Touching assist Assistive device: Rollator   Wheelchair     Assist Is the patient using a wheelchair?: No Type of Wheelchair: Manual    Wheelchair assist level: Supervision/Verbal cueing Max wheelchair distance: 10    Wheelchair 50 feet with 2 turns activity    Assist        Assist Level: Total Assistance - Patient < 25% (Patient propelled 10 ft (20%))   Wheelchair 150 feet activity     Assist      Assist Level: Total Assistance - Patient < 25% (Patient propelled 92ft)   Blood pressure 136/73, pulse 74, temperature 97.9 F (36.6 C), resp. rate 18, height 5\' 6"  (1.676 m), weight 54.1 kg, SpO2 98%.    Medical Problem List and Plan: 1. Functional deficits secondary to R hip fx s/p THA WBAT             -patient may  shower- cover incision/dressing             -ELOS/Goals: 14-18 days supervision, goal 12/30     Grounds pass ordered  added Scheduled pain meds for therapy since it's the most limiting issues -Continue CIR therapies including PT, OT  -discussed ortho f/u with wife -06/02/23 pt and wife with many questions regarding d/c tomorrow; explained that weekday team will be by tomorrow morning to discuss details of outpatient f/up and discharge planning.  12/30- will d/c dressing and staples today- looks great- is day 13, - will place steristrips- d/c today  2.  Antithrombotics: -DVT/anticoagulation:  Pharmaceutical: continue Lovenox 40mg  daily             -antiplatelet therapy: plan aspirin 81 mg BID at discharge             -05/25/23 BLE venous duplex negative   12/30- pt has severe abd pain with ASA- will not start per his request/chart has it as allergy. Counselled to move 3. Pain Management: Tylenol 650 mg q 6 hours -Robaxin as needed -oxycodone as needed -12/20- pain 7/10 this AM- asked nursing to give pain meds- pt feels meds work when takes them 12/23- pain "OK"- con't  regimen 12/26-27- pt receiving scheduled Oxy 10 mg with 6:30 am and noon which seems to be helping with therapy tolerance 4. Mood/Behavior/Sleep: LCSW to evaluate and provide emotional support             -continue Abilify 2 mg q HS             -continue Cymbalta DR 120 mg daily             -continue Remeron 30 mg q HS             -  antipsychotic agents: n/a   12/20- no changes to depression meds- is chronic dosing 5. Neuropsych/cognition: This patient is capable of making decisions on his own behalf.   6. Skin/Wound Care: Routine skin care checks             -monitor surgical incision (has surgical staples) -12/26 continue post-op dressing until time for staples to come out ~12/31 can remove day of dc 12/30-- reviewed this with them today 12/29, wife wants review of dressing changes if any are needed 12/30- d/c dressing and will remove staples- and place steristrips 7. Fluids/Electrolytes/Nutrition: Routine Is and Os and follow-up chemistries   12/20- labs look better- Na up to 134-  8: BPH: continue Proscar 5 mg q AM and Flomax 0.4 mg q 48 hours   12/23- voiding well per pt- intermittent incontinence-  9: Right hip displaced subcapital femoral neck fracture s/p right direct anterior total hip arthroplasty 05/21/2023 by Dr. Magnus Ivan             -WBAT             -follow-up in ~ 2 weeks   10: ABLA/iron deficiency:              -consider oral iron supplementation             -follow-up CBC   12/27 hgb up sl today to 8.5   -continue iron supp   -check stool for OB x 1   -recheck cbc Monday  11: Hyponatremia: follow-up BMP   12/20- up to 134 12: Leukocytosis: resolved 13. Hypotension D/c proscar -06/02/23 BP doing better mostly, monitor Vitals:   05/30/23 0510 05/30/23 1518 05/30/23 1902 05/31/23 0509  BP: (!) 105/59 (!) 99/56 (!) 108/53 (!) 141/71   05/31/23 1600 05/31/23 2027 06/01/23 0232 06/01/23 1535  BP: 135/75 106/61 (!) 140/66 113/61   06/01/23 1921 06/02/23 1403 06/02/23  1933 06/03/23 0456  BP: (!) 93/55 (!) 115/56 108/67 136/73    14. Constipation:  -05/25/23 unclear when LBM was, maybe 1-2 days ago, on colace 100mg  BID, senna 8.6mg  daily, and miralax qM/W/F; monitor if no BM this weekend then may need to adjust meds.  -05/26/23 still no BM, will increase miralax to daily including today, monitor, may need sorbitol tomorrow if no BM  12/23- 3 small BM's in last 24 hours- con't miralax  12/24- 3 BM's yesterday- too loose- will stop Colace and con't Miralax. Esp since going to be taking pain meds scheduled.   12/27: had bm 12/26   -now off miralax   -stools seem to be slowing/normalizing--no changes today -06/01/23 LBM either yesterday or the day before, yesterday's was flushed before staff viewed (so unclear if there was much); monitor for now -06/02/23 LBM this morning, cont monitoring 12/30- LBM this AM Pt's wife mentioned "dark stools" yesterday, but per his nurse, didn't think were black- if needs to, would f/u outpatient for this.  15. Hypokalemia  12/30- will give 40 Meq x1- will need f/u with PCP to check on labs   The patient is medically ready for discharge to home and will not need follow-up with [name]. In addition, they will need to follow up with their PCP, Orthopedics- will remove staples before d/c and place steristrips- for pain meds, will get 7 days of meds; then need to f/u with PCP vs surgeon for pain meds after that. .     LOS: 11 days A FACE TO FACE EVALUATION WAS PERFORMED  Bernadetta Roell 06/03/2023,  8:18 AM

## 2023-06-03 NOTE — Progress Notes (Signed)
The patient is here today for his first postoperative visit status post a right total hip arthroplasty to treat an impacted acute right hip femoral neck fracture.  He is 87 years old.  His wife is with him today.  He does not drive.  He says his range of motion and strength are improving slowly.  He is ambulating with a walker.  He has upcoming inpatient and was released today and they did remove his staples today.  His right hip incision looks good.  His calf is soft.  He tolerates me putting his right hip the range of motion.  From my standpoint we will see him back in a month to see how he is doing overall.  I actually would like a standing AP pelvis at that visit.  If he looks good after that visit, we may not see him back for 6 months or even a year.

## 2023-06-06 ENCOUNTER — Encounter: Payer: Self-pay | Admitting: Physician Assistant

## 2023-06-06 ENCOUNTER — Ambulatory Visit (INDEPENDENT_AMBULATORY_CARE_PROVIDER_SITE_OTHER): Payer: Medicare Other | Admitting: Physician Assistant

## 2023-06-06 VITALS — Wt 121.4 lb

## 2023-06-06 DIAGNOSIS — Z9289 Personal history of other medical treatment: Secondary | ICD-10-CM

## 2023-06-06 DIAGNOSIS — F411 Generalized anxiety disorder: Secondary | ICD-10-CM

## 2023-06-06 DIAGNOSIS — F322 Major depressive disorder, single episode, severe without psychotic features: Secondary | ICD-10-CM | POA: Diagnosis not present

## 2023-06-06 DIAGNOSIS — R5383 Other fatigue: Secondary | ICD-10-CM

## 2023-06-06 DIAGNOSIS — R5381 Other malaise: Secondary | ICD-10-CM | POA: Diagnosis not present

## 2023-06-06 MED ORDER — MIRTAZAPINE 30 MG PO TABS: 30.0000 mg | ORAL_TABLET | Freq: Every day | ORAL | 1 refills | Status: DC

## 2023-06-06 MED ORDER — ARIPIPRAZOLE 2 MG PO TABS: 4.0000 mg | ORAL_TABLET | Freq: Every day | ORAL | 0 refills | Status: DC

## 2023-06-06 NOTE — Therapy (Addendum)
 OUTPATIENT OCCUPATIONAL THERAPY NEURO EVALUATION  Patient Name: Jeremy [name] MRN: [id number] DOB:Feb 13, 1936, 88 y.o., male Today's Date: 06/07/2023  PCP: Gerome Brunet, DO  REFERRING PROVIDER: Pegge Toribio PARAS, PA-C   END OF SESSION:  OT End of Session - 06/07/23 1438     Visit Number 1    Number of Visits 7   including eval   Date for OT Re-Evaluation 08/02/23    Authorization Type [employer] [id number]    OT Start Time 1350    OT Stop Time 1435    OT Time Calculation (min) 45 min    Activity Tolerance Patient tolerated treatment well    Behavior During Therapy Flat affect             Past Medical History:  Diagnosis Date   Aortic atherosclerosis (HCC)    BPH (benign prostatic hyperplasia)    Bronchiectasis (HCC)    Depression    Depression    GAD (generalized anxiety disorder)    Past Surgical History:  Procedure Laterality Date   APPENDECTOMY     HERNIA REPAIR     TOTAL HIP ARTHROPLASTY Right 05/21/2023   Procedure: RIGHT TOTAL HIP ARTHROPLASTY;  Surgeon: Vernetta Lonni GRADE, MD;  Location: [facility] OR;  Service: Orthopedics;  Laterality: Right;   Patient Active Problem List   Diagnosis Date Noted   Malnutrition of moderate degree 05/23/2023   Closed right hip fracture (HCC) 05/23/2023   Closed subcapital fracture of right femur (HCC) 05/21/2023   Closed right hip fracture, initial encounter (HCC) 05/20/2023   Normocytic anemia 05/20/2023   Bronchiectasis (HCC) 05/20/2023   First degree AV block 05/20/2023   Anxiety and depression 05/20/2023   BPH (benign prostatic hyperplasia) 05/20/2023   Severe depression (HCC) 06/09/2021   Generalized anxiety disorder 06/09/2021   History of psychosis 06/09/2021   Insomnia 09/11/2020    ONSET DATE: 05/30/2023  (referral date), 05/21/23 (date of R total hip arthroplasty)  REFERRING DIAG: S72.001A (ICD-10-CM) - Fracture of unspecified part of neck of right femur, initial encounter for closed fracture   THERAPY DIAG:  Muscle  weakness (generalized)  Unsteadiness on feet  Pain in right hip  Rationale for Evaluation and Treatment: Rehabilitation  SUBJECTIVE:   SUBJECTIVE STATEMENT: Pt reported being okay with mobility.  Pt reported requiring assistance for brushing teeth, shaving, getting dressed.  Pt accompanied by: self and significant other Carlotta)  PERTINENT HISTORY: s/p right total hip arthroplasty (05/21/23) to treat an impacted acute right hip femoral neck fracture after mechanical fall, chronic back pain, bronchiectasis, severe depression, anxiety, aortic atherosclerosis, iron deficiency, hyponatremia, leukocytosis  PRECAUTIONS: Fall  WEIGHT BEARING RESTRICTIONS: Yes    Per 05/23/23 PT Progress Notes: Post op he is WBAT on RLE with no hip precautions.   PAIN:  Are you having pain? Yes: NPRS scale: 6/10 Pain location: R hip and R side of leg Pain description: aching Aggravating factors: activity can sharpen the pain to stabbing pain Relieving factors: rest  FALLS: Has patient fallen in last 6 months? Yes. Number of falls 3 Pt reported foot slipping when trying to get dressed for 2-3 of the falls.  Per 06/03/23 PA-C D/C Summary: Pt sustained a mechanical fall at home on 05/20/2023. Imaging significant for displaced right femoral neck fracture. No other injuries. Per 06/03/23 MD Progress Notes: s/p right total hip arthroplasty (05/21/23) to treat an impacted acute right hip femoral neck fracture  LIVING ENVIRONMENT: Lives with: lives with their family Lives in: House/apartment Stairs: Yes: External: a couple  steps; none (uses post as railing) Has following equipment at home: Vannie - 4 wheeled, Grab bars, and shower chair without back, walk-in shower and tub/shower  PLOF:  requires assistance with some BADLs/IADLs , does not drive  PATIENT GOALS: to be as independent as possible.  OBJECTIVE:  Note: Objective measures were completed at Evaluation unless otherwise noted.  HAND  DOMINANCE: Left  ADLs: Overall ADLs: requires some assistance Eating: ind Grooming: requires assistance for brushing teeth, shaving d/t long-term depression and tremors of BUE UB Dressing: ind, assistance required for buttons/zippers LB Dressing: requires assistance for pants/socks/shoes Toileting: requires assistance Bathing: requires assistance, currently sponge bathing Tub Shower transfers: not completing d/t pt sponge bathing Equipment: Shower seat without back, Grab bars, Walk in shower, and tub/shower  IADLs: Shopping: dependent Light housekeeping: dependent Meal Prep: dependent Community mobility: not currently driving Medication management: dependent Handwriting:  Pt reported difficulty with handwriting d/t resting hand tremors. Pt wrote x1 simple sentence with approx. 60% legibility. OT noted wavering line quality d/t tremors of BUE and decreased spacing between words.  MOBILITY STATUS: Independent, uses rollator walker for home and community mobility  POSTURE COMMENTS:  Ind seated posture  ACTIVITY TOLERANCE: Activity tolerance: more fatigued since hip surgery though pt reported I had already been having trouble.  FUNCTIONAL OUTCOME MEASURES: PSFS : 2.0    UPPER EXTREMITY ROM:    Active ROM Right eval Left eval  Shoulder flexion 105* 115*  Shoulder abduction 118* 118*  Shoulder adduction    Shoulder extension    Shoulder internal rotation    Shoulder external rotation    Elbow flexion [facility] WFL  Elbow extension [facility] WFL  Wrist flexion [facility] WFL  Wrist extension [facility] WFL  Wrist ulnar deviation impaired impaired  Wrist radial deviation impaired impaired  Wrist pronation [facility] WFL  Wrist supination WFL WFL  (Blank rows = not tested)  LUE: Pointer finger on LUE  - PIP slight hyperext Enlarged MCP joints of digits 1-5 PIP joint hyperext severe for digit 3-5 Pt able to achieve neutral ext (0*) of PIP joints of LUE with gentle PROM. Pt c/o slight discomfort  if flexing digits past neutral ext (0* ext).  RUE: Pinky finger on RUE - PIP slight hyperext Enlarged MCP joints of digits 1-5 PIP joint hyperext severe for digit 2-4 Pt able to achieve neutral ext (0*) of PIP joints of LUE with gentle PROM. Pt c/o slight discomfort if flexing digits past neutral ext (0* ext).  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 Pt reported hx of arthritis. Pt unable to make full composite fist secondary to PIP joint hyperext of BUE.   Pt able to oppose digit 5 to thumb of BUE.  Pt able to pinch with thumb and pointer finger and thumb and middle finger of BUE.   UPPER EXTREMITY MMT:     MMT Right eval Left eval  Shoulder flexion 4+ 4+  Shoulder abduction    Shoulder adduction    Shoulder extension    Shoulder internal rotation    Shoulder external rotation    Middle trapezius    Lower trapezius    Elbow flexion    Elbow extension    Wrist flexion    Wrist extension    Wrist ulnar deviation    Wrist radial deviation    Wrist pronation    Wrist supination    (Blank rows = not tested)  HAND FUNCTION: Grip and pinch strength not tested d/t deformity of  B PIP joints.  COORDINATION: 9 Hole Peg test: Right: 59 sec; Left: 53 sec  SENSATION: Pt reported mild tingling of B hands.  EDEMA: None noted.  MUSCLE TONE: RUE: Within functional limits and LUE: Within functional limits  COGNITION: Overall cognitive status: Within functional limits for tasks assessed  PERCEPTION: Not tested  PRAXIS: Not tested  OBSERVATIONS:  OT noted pt demo'd resting tremor of BUE, more pronounced of LUE.  OT noted hyperextension of BUE PIP joints with pt reporting hx of swan neck deformity. Pt demo'd increased difficulty with functional grip/pinch d/t BUE deformity though able to pick up pegs for 9-hole peg test indicating functional pinch.  Pt reported previously receiving hand therapy to address swan neck deformity at [name]. Pt  reported also seeing rheumatologist. Pt reported there wasn't much progress as a result of therapy and pt reported previously trying splinting in the past without much success.  Pt ambulated with A/E.                                                                                                                            TREATMENT DATE:   Eval only  OT noted potential impact of BUE swan neck defomity on ADL/IADL participation. However, pt politely declined further discussing options to potentially address BUE swan neck deformity. Pt requested to focus primarily on general strengthening/endurance and other relevant factors related to recent hip surgery. OT educated pt on referral dx and reflected pt's requests in goals, please see below. Pt verbalized understanding.   PATIENT EDUCATION: Education details: OT role, POC, brief overview of A/E options for ADLs/IADLs Person educated: Patient and Spouse Education method: Explanation Education comprehension: verbalized understanding  HOME EXERCISE PROGRAM: TBD   GOALS: Goals reviewed with patient? Yes  SHORT TERM GOALS: Target date: 07/05/23  Pt will be ind with initial general strengthening/endurance HEP Baseline: new to outpt OT Goal status: INITIAL  2.  Pt will demo improved standing tolerance as evidenced by standing for at least 5 minutes while participating in functional task at tabletop with no more than supervision assist. Baseline: Pt reported decreased strength and endurance after R hip surgery. Goal status: INITIAL  3.  Pt will verbalize understanding of tremor reduction strategies to increase ind for handwriting and grooming ADL considerations. Baseline: new to outpt OT Goal status: INITIAL  4.  Pt will verbalize understanding of A/E options for bathing ADL tasks, including but not limited to transfer bench for tub shower, shower stool, long-handled sponge. Baseline: Pt currently participating in sponge-bathing with  assistance to complete bathing ADL. Goal status: INITIAL  5.  Pt will verbalize understanding of A/E options for toileting ADL tasks, including but not limited to bidet, toilet aid, reacher for clothing management. Baseline: Assistance required Goal status: INITIAL  6.  Pt will verbalize understanding of A/E options to improve ability to pick up items from floor.  Baseline: New to outpt OT. Pt reported moderate difficulty to pick up  items from floor and reported PSFS individual score for picking up items from floor as 1 out of 10. Goal status: INITIAL  LONG TERM GOALS: Target date: 08/02/23  Pt will be ind with general strengthening/endurance HEP Baseline: new to outpt OT Goal status: INITIAL  2.  Pt will demo understanding of safe LB dressing strategies using adaptive strategies and A/E PRN, including completion of LB dressing while seated. Baseline: Pt reported currently requiring assistance for LB dressing tasks. Pt reported hx of previous falls d/t attempting to dress while standing.  Goal status: INITIAL  3. Patient will report at least two-point increase in average PSFS score or at least three-point increase in a single activity score indicating functionally significant improvement given minimum detectable change. Baseline: PSFS: 2.0 total score (See above for individual activity scores)  Goal status: INITIAL  ASSESSMENT:  CLINICAL IMPRESSION: Patient is a 88 y.o. male who was seen today for occupational therapy evaluation for Fracture of unspecified part of neck of right femur. Hx includes s/p right total hip arthroplasty (05/21/23) to treat an impacted acute right hip femoral neck fracture after mechanical fall, chronic back pain, bronchiectasis, severe depression, anxiety, aortic atherosclerosis, iron deficiency, hyponatremia, leukocytosis. Patient currently presents at low level of functioning demonstrating functional deficits and impairments as noted below. Pt would benefit  from skilled OT services in the outpatient setting to work on impairments as noted below, to increase ind for ADL tasks, to improve standing tolerance/endurance, and to improve understanding of adaptive strategies and A/E for ADL/IADL tasks.   PERFORMANCE DEFICITS: in functional skills including ADLs, IADLs, coordination, dexterity, proprioception, sensation, ROM, strength, pain, flexibility, Fine motor control, Gross motor control, mobility, balance, body mechanics, endurance, decreased knowledge of use of DME, and UE functional use, cognitive skills including energy/drive, and psychosocial skills including environmental adaptation.   IMPAIRMENTS: are limiting patient from ADLs, IADLs, leisure, and social participation.   CO-MORBIDITIES: may have co-morbidities  that affects occupational performance. Patient will benefit from skilled OT to address above impairments and improve overall function.  MODIFICATION OR ASSISTANCE TO COMPLETE EVALUATION: Min-Moderate modification of tasks or assist with assess necessary to complete an evaluation.  OT OCCUPATIONAL PROFILE AND HISTORY: Problem focused assessment: Including review of records relating to presenting problem.  CLINICAL DECISION MAKING: LOW - limited treatment options, no task modification necessary  REHAB POTENTIAL: Good  EVALUATION COMPLEXITY: Low    PLAN:  OT FREQUENCY: 1x/week  OT DURATION: 6 weeks (dates extended to allow for scheduling)  PLANNED INTERVENTIONS: 97168 OT Re-evaluation, 97535 self care/ADL training, 02889 therapeutic exercise, 97530 therapeutic activity, 97112 neuromuscular re-education, 97140 manual therapy, 97760 Orthotics management and training, 02239 Splinting (initial encounter), [phone] Subsequent splinting/medication, passive range of motion, functional mobility training, energy conservation, patient/family education, and DME and/or AE instructions  RECOMMENDED OTHER SERVICES: PT eval completed  CONSULTED AND  AGREED WITH PLAN OF CARE: Patient and family member/caregiver  PLAN FOR NEXT SESSION:   Handout - tremor reduction strategies. Also show example of weighted writing utensil for handwriting  Educate on A/E options: transfer bench for tub shower, shower stool, long-handled sponge, bidet, toilet aid, reacher for clothing management and LB dressing  Educate on LB dressing adaptive strategies to increase ind  Initiate general strength/endurance HEP : trunk/BUE - avoid theraband and other resistive materials secondary to PIP hyperext of BUE digits   Geofm FORBES Coder, OT 06/07/2023, 3:07 PM

## 2023-06-06 NOTE — Progress Notes (Signed)
 [facility] Check  Patient ID: Jeremy [name],  MRN: [id number]  PCP: Gerome Brunet, DO  Date of Evaluation: 06/06/2023 Time spent:35 minutes  Chief Complaint:  Chief Complaint   Depression; Follow-up    HISTORY/CURRENT STATUS: HPI  For routine med check. Wife Jeremy [name] is with him.  See review of systems.  Jeremy [name] has only been out of rehab 3 days but he is starting to go back into old habits of not wanting to get out of bed or eat.  He was hospitalized for 3 days because of the hip fracture and replacement surgery and states the routine of hospitalization and rehab helped him, he had to eat when the meal was given and he had to do PT at the times he was told to do so.  So he was 'forced' to get out and bed. Was then in SNF for almost 2 weeks. States he felt better toward the end of the hospitalization, the routine was good for him.  He still does not want to get out of bed and do anything now that he is home.  He gained 11 pounds since our last visit in October which he attributes to having a more routine mealtime.  He realizes he is falling back into old habits.  But does not have the motivation to do anything like he did when he was hospitalized.  Personal hygiene is normal.  Energy is low.  He does not cry easily.  No feelings of hopelessness but feels like what is the point.  No reports of extreme anxiety.  No suicidal or homicidal thoughts.  Patient denies increased energy with decreased need for sleep, increased talkativeness, racing thoughts, impulsivity or risky behaviors, increased spending, increased libido, grandiosity, increased irritability or anger, paranoia, or hallucinations.  Review of Systems  Constitutional:  Positive for malaise/fatigue.  HENT: Negative.    Eyes: Negative.   Respiratory: Negative.    Cardiovascular: Negative.   Gastrointestinal: Negative.   Genitourinary: Negative.   Musculoskeletal:        See HPI and notes on chart  Skin: Negative.    Neurological: Negative.   Endo/Heme/Allergies: Negative.   Psychiatric/Behavioral:         See HPI   Individual Medical History/ Review of Systems: Changes? :Yes   fx right hip after a fall at home 05/20/2023.  Status post total hip replacement.  Anemic. Started on oral iron.  Had low Na, corrected before d/c.  He has been home from rehab since 06/03/2023.   Past medications for mental health diagnoses include: (None listed in old chart) Lithium  he took only for a few weeks for severe depression with suicidal thoughts but did not like it so he stopped it.  cymbalta , mirtazapine , Ativan ,   Was hospitalized many times since 1964, most recent was in 2005.   Allergies: Aspergillus allergy skin test, Aspirin , Sulfa antibiotics, and Theophyllines  Current Medications:  Current Outpatient Medications:    acetaminophen  (TYLENOL ) 325 MG tablet, Take 1-2 tablets (325-650 mg total) by mouth every 4 (four) hours as needed., Disp: , Rfl:    DULoxetine  (CYMBALTA ) 60 MG capsule, Take 2 capsules (120 mg total) by mouth daily., Disp: 180 capsule, Rfl: 1   Fe Fum-Vit C-Vit B12-FA (TRIGELS-F FORTE) CAPS capsule, Take 1 capsule by mouth daily after breakfast., Disp: 30 capsule, Rfl: 0   LORazepam  (ATIVAN ) 0.5 MG tablet, Take 1/2-1 tablet twice daily as needed for severe anxiety/panic, Disp: 60 tablet, Rfl: 1   senna (SENOKOT) 8.6 MG  TABS tablet, Take 1 tablet (8.6 mg total) by mouth daily. (Patient not taking: Reported on 06/07/2023), Disp: , Rfl:    tamsulosin  (FLOMAX ) 0.4 MG CAPS capsule, Take 1 capsule (0.4 mg total) by mouth every other day., Disp: , Rfl:    ARIPiprazole  (ABILIFY ) 2 MG tablet, Take 2 tablets (4 mg total) by mouth daily., Disp: 180 tablet, Rfl: 0   clotrimazole -betamethasone  (LOTRISONE ) cream, APPLY TO AFFECTED AREA TWICE A DAY (Patient not taking: Reported on 06/06/2023), Disp: 30 g, Rfl: 0   finasteride  (PROPECIA ) 1 MG tablet, Take 1 mg by mouth daily., Disp: , Rfl:    methocarbamol   (ROBAXIN ) 500 MG tablet, Take 1 tablet (500 mg total) by mouth every 6 (six) hours as needed for muscle spasms. (Patient not taking: Reported on 06/07/2023), Disp: 30 tablet, Rfl: 0   mirtazapine  (REMERON ) 30 MG tablet, Take 1 tablet (30 mg total) by mouth at bedtime., Disp: 90 tablet, Rfl: 1   Oxycodone  HCl 10 MG TABS, Take 1 tablet (10 mg total) by mouth 2 (two) times daily. (Patient not taking: Reported on 06/06/2023), Disp: 14 tablet, Rfl: 0 Medication Side Effects: none  Family Medical/ Social History: Changes? No  MENTAL HEALTH EXAM:  Weight 121 lb 6.4 oz (55.1 kg).Body mass index is 19.59 kg/m.  11# weight gain in 3.5 months  General Appearance: Casual, Well Groomed, and thin, arthritic changes of his hands bilaterally  Eye Contact:  Good  Speech:  Clear and Coherent and Normal Rate  Volume:  Decreased  Mood:  Depressed  Affect:   Looks sad but he does smile several times which is different for him  Thought Process:  Goal Directed and Descriptions of Associations: Circumstantial  Orientation:  Full (Time, Place, and Person)  Thought Content: Logical   Suicidal Thoughts:  No  Homicidal Thoughts:  No  Memory:  WNL  Judgement:  Good  Insight:  Good  Psychomotor Activity:   Walking slowly with a rolader walker  Concentration:  Concentration: Good and Attention Span: Good  Recall:  Good  Fund of Knowledge: Good  Language: Good  Assets:  Special Educational Needs Teacher  ADL's:  Intact  Cognition: WNL  Prognosis:  Poor   Notes from hospital 05/20/2023 up until discharge from rehab on 06/03/2023 were reviewed.  See notes on chart.   DIAGNOSES:    ICD-10-CM   1. Severe depression (HCC)  F32.2     2. Malaise and fatigue  R53.81    R53.83     3. Generalized anxiety disorder  F41.1     4. Hospitalization within last 30 days  Z92.89      Receiving Psychotherapy: Yes Dr. Jodie Mitchum   RECOMMENDATIONS:  PDMP was reviewed.  Given 14  oxycodone  on 06/03/2023. I provided 35 minutes of face to face time during this encounter, including time spent before and after the visit in records review, medical decision making, counseling pertinent to today's visit, and charting.   I'm sorry to hear about the fx, surgery, rehab. But we need to capitalize on knowing what a good routine will do for his physical and mental health. He's gained 11 # in 3 months which is great! He's quickly falling back into old/bad habits and I've encouraged him to stay out of bed most of the time, of course w/ direction from PT/OT as he continues to recover from surgery. Eating at least 2 meals a day is very important for mental and physical health, he's anemic and good  nutrition will help energy.   Recommend increasing the Abilify  to help lift mood, hopefully will increase hunger and he'll gain more weight.   Increase Abilify  2 mg, to 2 every day. Continue Cymbalta  60 mg, 2 p.o. daily. Continue mirtazapine  30 mg, 1 p.o. nightly. Recommend restarting multivitamin, vitamin D , B complex, and fish oil. Continue therapy with Dr. Jodie Kendall. Return in 6 weeks.  Verneita Cooks, PA-C

## 2023-06-07 ENCOUNTER — Other Ambulatory Visit: Payer: Self-pay

## 2023-06-07 ENCOUNTER — Ambulatory Visit: Payer: Medicare Other | Admitting: Occupational Therapy

## 2023-06-07 ENCOUNTER — Ambulatory Visit: Payer: Medicare Other | Attending: Physician Assistant | Admitting: Physical Therapy

## 2023-06-07 DIAGNOSIS — R2681 Unsteadiness on feet: Secondary | ICD-10-CM | POA: Insufficient documentation

## 2023-06-07 DIAGNOSIS — M25551 Pain in right hip: Secondary | ICD-10-CM | POA: Diagnosis not present

## 2023-06-07 DIAGNOSIS — M6281 Muscle weakness (generalized): Secondary | ICD-10-CM | POA: Diagnosis not present

## 2023-06-07 DIAGNOSIS — R2689 Other abnormalities of gait and mobility: Secondary | ICD-10-CM | POA: Diagnosis not present

## 2023-06-07 NOTE — Therapy (Signed)
 OUTPATIENT PHYSICAL THERAPY LOWER EXTREMITY EVALUATION   Patient Name: Jeremy [name] MRN: [id number] DOB:1936-02-07, 88 y.o., male Today's Date: 06/07/2023  END OF SESSION:  PT End of Session - 06/07/23 1319     Visit Number 1    Number of Visits 13   with eval   Date for PT Re-Evaluation 08/02/23    Authorization Type Medicare    Progress Note Due on Visit 10    PT Start Time 1317    PT Stop Time 1350   eval   PT Time Calculation (min) 33 min    Equipment Utilized During Treatment Gait belt    Activity Tolerance Patient tolerated treatment well    Behavior During Therapy Flat affect             Past Medical History:  Diagnosis Date   Aortic atherosclerosis (HCC)    BPH (benign prostatic hyperplasia)    Bronchiectasis (HCC)    Depression    Depression    GAD (generalized anxiety disorder)    Past Surgical History:  Procedure Laterality Date   APPENDECTOMY     HERNIA REPAIR     TOTAL HIP ARTHROPLASTY Right 05/21/2023   Procedure: RIGHT TOTAL HIP ARTHROPLASTY;  Surgeon: Vernetta Lonni GRADE, MD;  Location: [facility] OR;  Service: Orthopedics;  Laterality: Right;   Patient Active Problem List   Diagnosis Date Noted   Malnutrition of moderate degree 05/23/2023   Closed right hip fracture (HCC) 05/23/2023   Closed subcapital fracture of right femur (HCC) 05/21/2023   Closed right hip fracture, initial encounter (HCC) 05/20/2023   Normocytic anemia 05/20/2023   Bronchiectasis (HCC) 05/20/2023   First degree AV block 05/20/2023   Anxiety and depression 05/20/2023   BPH (benign prostatic hyperplasia) 05/20/2023   Severe depression (HCC) 06/09/2021   Generalized anxiety disorder 06/09/2021   History of psychosis 06/09/2021   Insomnia 09/11/2020    PCP: Gerome Brunet, DO  REFERRING PROVIDER: Pegge Toribio PARAS, PA-C  REFERRING DIAG: S72.001A (ICD-10-CM) - Fracture of unspecified part of neck of right femur, initial encounter for closed fracture  THERAPY DIAG:   Muscle weakness (generalized)  Other abnormalities of gait and mobility  Unsteadiness on feet  Pain in right hip  Rationale for Evaluation and Treatment: Rehabilitation  ONSET DATE: 05/30/2023 (referral date)  SUBJECTIVE:   SUBJECTIVE STATEMENT: Pt reports that he has has several falls in the past 6 months, one resulted in him needing stiches and then with his most recent fall he broke his R hip. Pt had a R hip replacement in the hospital and went to [facility], recently d/c home. Pt reports that prior to his hospital admission he didn't use an AD, now uses rollator. Pt wants to work on getting away from the rollator.  Prior to a decline in overall function over the past few years he enjoyed gardening and running.  Wife: Jeremy [name]  PERTINENT HISTORY: PMH: BPH, bronchiectasis, severe depression, anxiety and aortic atherosclerosis  PAIN:  Are you having pain? Yes: NPRS scale: 6/10 Pain location: R hip Pain description: soreness Aggravating factors: certain movements Relieving factors: resting  PRECAUTIONS: Fall  RED FLAGS: None   WEIGHT BEARING RESTRICTIONS: Yes WBAT RLE  FALLS:  Has patient fallen in last 6 months? Yes. Number of falls 3 in the past 6 months, feel like legs slip out from under him; usually needs help to get back up  LIVING ENVIRONMENT: Lives with: lives with their spouse Lives in: House/apartment Stairs: Yes: External: 3-4 steps; has  a post to hold onto Has following equipment at home: Vannie - 4 wheeled, shower chair, and Grab bars  OCCUPATION: retired  PLOF: Independent with gait, Independent with transfers, and Requires assistive device for independence  PATIENT GOALS: walk independently  NEXT MD VISIT: sees hip surgeon in 4 weeks (06/30/22)  OBJECTIVE:  Note: Objective measures were completed at Evaluation unless otherwise noted.  DIAGNOSTIC FINDINGS:  R hip xray prior to surgery 05/20/23 FINDINGS: SI joints are non widened. Pubic symphysis  and rami appear intact. Acute right femoral neck fracture. No femoral head dislocation   IMPRESSION: Acute right femoral neck fracture.  COGNITION: Overall cognitive status: Difficulty to assess due to: flat affect       POSTURE: rounded shoulders, forward head, and posterior pelvic tilt   LOWER EXTREMITY ROM:  Active ROM Right eval Left eval  Hip flexion Decreased due to pain   Hip extension    Hip abduction    Hip adduction    Hip internal rotation    Hip external rotation    Knee flexion    Knee extension    Ankle dorsiflexion    Ankle plantarflexion    Ankle inversion    Ankle eversion     (Blank rows = not tested)  LOWER EXTREMITY MMT:  MMT Right eval Left eval  Hip flexion 4 5  Hip extension    Hip abduction    Hip adduction    Hip internal rotation    Hip external rotation    Knee flexion 4 5  Knee extension 4 5  Ankle dorsiflexion 5 5  Ankle plantarflexion    Ankle inversion    Ankle eversion     (Blank rows = not tested)   FUNCTIONAL TESTS:    [facility] PT Assessment - 06/07/23 1329       Ambulation/Gait   Gait velocity 32.8 ft over 11.25 sec = 2.92 ft/sec      Standardized Balance Assessment   Standardized Balance Assessment Timed Up and Go Test;Five Times Sit to Stand;Berg Balance Test    Five times sit to stand comments  26.18 sec   no UE support     Berg Balance Test   Sit to Stand Able to stand  independently using hands    Standing Unsupported Able to stand 2 minutes with supervision    Sitting with Back Unsupported but Feet Supported on Floor or Stool Able to sit safely and securely 2 minutes    Stand to Sit Sits safely with minimal use of hands    Transfers Able to transfer safely, definite need of hands    Standing Unsupported with Eyes Closed Able to stand 10 seconds with supervision    Standing Unsupported with Feet Together Able to place feet together independently and stand for 1 minute with supervision    From Standing, Reach  Forward with Outstretched Arm Reaches forward but needs supervision    From Standing Position, Pick up Object from Floor Unable to try/needs assist to keep balance   needs min A but able to do it   From Standing Position, Turn to Look Behind Over each Shoulder Needs supervision when turning    Turn 360 Degrees Needs close supervision or verbal cueing    Standing Unsupported, Alternately Place Feet on Step/Stool Able to complete >2 steps/needs minimal assist    Standing Unsupported, One Foot in Front Able to take small step independently and hold 30 seconds    Standing on One Leg Able to  lift leg independently and hold > 10 seconds    Total Score 33    Berg comment: 33/56, high fall risk      Timed Up and Go Test   TUG Normal TUG    Normal TUG (seconds) 22.62   with rollator            GAIT: Distance walked: various clinic distances Assistive device utilized: Environmental Consultant - 4 wheeled and None Level of assistance: Modified independence and Min A Comments: mod I with rollator, min A with no AD                                                                                                                                TREATMENT: PT Evaluation    PATIENT EDUCATION:  Education details: Eval findings, results of OM and functional implications, PT POC Person educated: Patient and Spouse Education method: Explanation and Demonstration Education comprehension: verbalized understanding, returned demonstration, and needs further education  HOME EXERCISE PROGRAM: To be initiated  ASSESSMENT:  CLINICAL IMPRESSION: Patient is a 88 year old male referred to Neuro O[facility] for debility following a R hip fracture 2/2 fall.   Pt's PMH is significant for: BPH, bronchiectasis, severe depression, anxiety and aortic atherosclerosis. The following deficits were present during the exam: decreased RLE strength and ROM and impaired balance. Based on his fall history and gait speed, TUG score, and Berg score,  pt is an increased risk for falls. Pt would benefit from skilled PT to address these impairments and functional limitations to maximize functional mobility independence.   OBJECTIVE IMPAIRMENTS: Abnormal gait, decreased balance, decreased knowledge of use of DME, decreased mobility, decreased ROM, decreased strength, impaired UE functional use, postural dysfunction, and pain.   ACTIVITY LIMITATIONS: carrying, lifting, bending, standing, stairs, transfers, and bed mobility  PARTICIPATION LIMITATIONS: meal prep, cleaning, driving, and community activity  PERSONAL FACTORS: Age and 1-2 comorbidities:    BPH, bronchiectasis, severe depression, anxiety and aortic atherosclerosisare also affecting patient's functional outcome.   REHAB POTENTIAL: Good  CLINICAL DECISION MAKING: Stable/uncomplicated  EVALUATION COMPLEXITY: Low   GOALS: Goals reviewed with patient? Yes  SHORT TERM GOALS: Target date: 06/28/2023   Pt will be independent with initial HEP for improved strength, balance, transfers and gait. Baseline: Goal status: INITIAL  2.  Pt will improve 5 x STS to less than or equal to 22 seconds to demonstrate improved functional strength and transfer efficiency.  Baseline: 26.18 sec no UE (1/3) Goal status: INITIAL  3.  Pt will improve gait velocity to at least 3.25 ft/sec for improved gait efficiency and performance at mod I level  Baseline: 2.92 ft/sec with rollator (1/3) Goal status: INITIAL  4.  Pt will improve normal TUG to less than or equal to 19 seconds for improved functional mobility and decreased fall risk. Baseline: 22.62 sec with rollator (1/3) Goal status: INITIAL  5.  Pt will improve Berg score to 37/56 for decreased fall  risk Baseline: 33/56 (1/3) Goal status: INITIAL   LONG TERM GOALS: Target date: 07/19/2023    Pt will be independent with final HEP for improved strength, balance, transfers and gait. Baseline:  Goal status: INITIAL  2.  Pt will improve 5 x  STS to less than or equal to 18 seconds to demonstrate improved functional strength and transfer efficiency.  Baseline: 26.18 sec no UE (1/3) Goal status: INITIAL  3.  Pt will improve gait velocity to at least 3.5 ft/sec for improved gait efficiency and performance at mod I level  Baseline: 2.92 ft/sec with rollator (1/3) Goal status: INITIAL  4.  Pt will improve normal TUG to less than or equal to 16 seconds for improved functional mobility and decreased fall risk. Baseline: 22.62 sec with rollator (1/3) Goal status: INITIAL  5.  Pt will improve Berg score to 41/56 for decreased fall risk Baseline: 33/56 (1/3) Goal status: INITIAL     PLAN:  PT FREQUENCY: 2x/week  PT DURATION: 6 weeks  PLANNED INTERVENTIONS: 97164- PT Re-evaluation, 97110-Therapeutic exercises, 97530- Therapeutic activity, 97112- Neuromuscular re-education, 97535- Self Care, 02859- Manual therapy, [phone]- Gait training, Patient/Family education, Balance training, Stair training, Taping, Dry Needling, Scar mobilization, DME instructions, Cryotherapy, and Moist heat  PLAN FOR NEXT SESSION: pt goal is to get away from rollator; initiate HEP to work on functional strengthening and balance (sit to stands, balance with decreased UE support)   Waddell Southgate, PT Waddell Southgate, PT, DPT, CSRS  06/07/2023, 1:51 PM

## 2023-06-11 DIAGNOSIS — Z5181 Encounter for therapeutic drug level monitoring: Secondary | ICD-10-CM | POA: Diagnosis not present

## 2023-06-11 DIAGNOSIS — N400 Enlarged prostate without lower urinary tract symptoms: Secondary | ICD-10-CM | POA: Diagnosis not present

## 2023-06-11 DIAGNOSIS — R972 Elevated prostate specific antigen [PSA]: Secondary | ICD-10-CM | POA: Diagnosis not present

## 2023-06-11 DIAGNOSIS — E538 Deficiency of other specified B group vitamins: Secondary | ICD-10-CM | POA: Diagnosis not present

## 2023-06-11 DIAGNOSIS — E785 Hyperlipidemia, unspecified: Secondary | ICD-10-CM | POA: Diagnosis not present

## 2023-06-11 DIAGNOSIS — M8580 Other specified disorders of bone density and structure, unspecified site: Secondary | ICD-10-CM | POA: Diagnosis not present

## 2023-06-11 DIAGNOSIS — E559 Vitamin D deficiency, unspecified: Secondary | ICD-10-CM | POA: Diagnosis not present

## 2023-06-11 DIAGNOSIS — R946 Abnormal results of thyroid function studies: Secondary | ICD-10-CM | POA: Diagnosis not present

## 2023-06-11 DIAGNOSIS — R7309 Other abnormal glucose: Secondary | ICD-10-CM | POA: Diagnosis not present

## 2023-06-11 DIAGNOSIS — D649 Anemia, unspecified: Secondary | ICD-10-CM | POA: Diagnosis not present

## 2023-06-13 ENCOUNTER — Ambulatory Visit: Payer: Medicare Other | Admitting: Physical Therapy

## 2023-06-13 DIAGNOSIS — R2681 Unsteadiness on feet: Secondary | ICD-10-CM | POA: Diagnosis not present

## 2023-06-13 DIAGNOSIS — M25551 Pain in right hip: Secondary | ICD-10-CM | POA: Diagnosis not present

## 2023-06-13 DIAGNOSIS — R2689 Other abnormalities of gait and mobility: Secondary | ICD-10-CM | POA: Diagnosis not present

## 2023-06-13 DIAGNOSIS — M6281 Muscle weakness (generalized): Secondary | ICD-10-CM

## 2023-06-13 NOTE — Therapy (Signed)
 OUTPATIENT PHYSICAL THERAPY LOWER EXTREMITY TREATMENT   Patient Name: Jeremy [name] MRN: [id number] DOB:1936-02-07, 88 y.o., male Today's Date: 06/13/2023  END OF SESSION:  PT End of Session - 06/13/23 1536     Visit Number 2    Number of Visits 13   with eval   Date for PT Re-Evaluation 08/02/23    Authorization Type Medicare    Progress Note Due on Visit 10    PT Start Time 1535    PT Stop Time 1613    PT Time Calculation (min) 38 min    Equipment Utilized During Treatment Gait belt    Activity Tolerance Patient tolerated treatment well    Behavior During Therapy Flat affect              Past Medical History:  Diagnosis Date   Aortic atherosclerosis (HCC)    BPH (benign prostatic hyperplasia)    Bronchiectasis (HCC)    Depression    Depression    GAD (generalized anxiety disorder)    Past Surgical History:  Procedure Laterality Date   APPENDECTOMY     HERNIA REPAIR     TOTAL HIP ARTHROPLASTY Right 05/21/2023   Procedure: RIGHT TOTAL HIP ARTHROPLASTY;  Surgeon: Vernetta Lonni GRADE, MD;  Location: [facility] OR;  Service: Orthopedics;  Laterality: Right;   Patient Active Problem List   Diagnosis Date Noted   Malnutrition of moderate degree 05/23/2023   Closed right hip fracture (HCC) 05/23/2023   Closed subcapital fracture of right femur (HCC) 05/21/2023   Closed right hip fracture, initial encounter (HCC) 05/20/2023   Normocytic anemia 05/20/2023   Bronchiectasis (HCC) 05/20/2023   First degree AV block 05/20/2023   Anxiety and depression 05/20/2023   BPH (benign prostatic hyperplasia) 05/20/2023   Severe depression (HCC) 06/09/2021   Generalized anxiety disorder 06/09/2021   History of psychosis 06/09/2021   Insomnia 09/11/2020    PCP: Gerome Brunet, DO  REFERRING PROVIDER: Pegge Toribio PARAS, PA-C  REFERRING DIAG: S72.001A (ICD-10-CM) - Fracture of unspecified part of neck of right femur, initial encounter for closed fracture  THERAPY DIAG:  Muscle  weakness (generalized)  Unsteadiness on feet  Pain in right hip  Other abnormalities of gait and mobility  Rationale for Evaluation and Treatment: Rehabilitation  ONSET DATE: 05/30/2023 (referral date)  SUBJECTIVE:   SUBJECTIVE STATEMENT: Pt presents w/rollator. Denies acute changes. Pain is 5/10 today. Has not been doing his exercises from [facility].    Wife: [name] (in lobby)   PERTINENT HISTORY: PMH: BPH, bronchiectasis, severe depression, anxiety and aortic atherosclerosis  PAIN:  Are you having pain? Yes: NPRS scale: 5/10 Pain location: R hip Pain description: soreness Aggravating factors: certain movements Relieving factors: resting  PRECAUTIONS: Fall  RED FLAGS: None   WEIGHT BEARING RESTRICTIONS: Yes WBAT RLE  FALLS:  Has patient fallen in last 6 months? Yes. Number of falls 3 in the past 6 months, feel like legs slip out from under him; usually needs help to get back up  LIVING ENVIRONMENT: Lives with: lives with their spouse Lives in: House/apartment Stairs: Yes: External: 3-4 steps; has a post to hold onto Has following equipment at home: Vannie - 4 wheeled, shower chair, and Grab bars  OCCUPATION: retired  PLOF: Independent with gait, Independent with transfers, and Requires assistive device for independence  PATIENT GOALS: walk independently  NEXT MD VISIT: sees hip surgeon in 4 weeks (06/30/22)  OBJECTIVE:  Note: Objective measures were completed at Evaluation unless otherwise noted.  DIAGNOSTIC FINDINGS:  R hip xray prior to surgery 05/20/23 FINDINGS: SI joints are non widened. Pubic symphysis and rami appear intact. Acute right femoral neck fracture. No femoral head dislocation   IMPRESSION: Acute right femoral neck fracture.  COGNITION: Overall cognitive status: Difficulty to assess due to: flat affect       POSTURE: rounded shoulders, forward head, and posterior pelvic tilt   LOWER EXTREMITY ROM:  Active ROM Right eval  Left eval  Hip flexion Decreased due to pain   Hip extension    Hip abduction    Hip adduction    Hip internal rotation    Hip external rotation    Knee flexion    Knee extension    Ankle dorsiflexion    Ankle plantarflexion    Ankle inversion    Ankle eversion     (Blank rows = not tested)  LOWER EXTREMITY MMT:  MMT Right eval Left eval  Hip flexion 4 5  Hip extension    Hip abduction    Hip adduction    Hip internal rotation    Hip external rotation    Knee flexion 4 5  Knee extension 4 5  Ankle dorsiflexion 5 5  Ankle plantarflexion    Ankle inversion    Ankle eversion     (Blank rows = not tested)                                                                                                                                 TREATMENT: Ther Ex  SciFit multi-peaks level 5 for 8 minutes using BUE/BLEs for neural priming for reciprocal movement, dynamic cardiovascular warmup and improved ROM of RLE. Min cues to maintain steps/min >65 and for full knee extension of RLE. RPE of 6/10 following activity  Established HEP (see bolded below) for improved functional hip strength, step clearance and posterior chain strength:  Fwd/retro/lateral monster walks w/red theraband around distal quads, x20' each direction w/intermittent UE support. Min cues to pick up feet rather than slide on the ground, which pt able to do well. No instability noted, pt denied pain w/activity. Practiced removing band from legs and pt required min A due to inability to fully reach down to feet. Pt reports wife can help him with this at home Quadruped bird dogs, x8 per side. Pt more challenged lifting RLE, but was able to stabilize well. No discomfort reported.  Goblet squats w/4# DB, x10 reps. Pt performed well w/equal weight shift. No increase in pain reported   Gait pattern: step through pattern, decreased stride length, decreased ankle dorsiflexion- Right, decreased ankle dorsiflexion- Left,  scissoring, lateral hip instability, narrow BOS, poor foot clearance- Right, and poor foot clearance- Left Distance walked: Various clinic distances  Assistive device utilized: Walker - 4 wheeled Level of assistance: Modified independence Comments: Noted scissoring of RLE and decreased eccentric control w/IC and FF on LLE. No LOB noted    PATIENT EDUCATION:  Education  details: Initial HEP  Person educated: Patient and Spouse Education method: Explanation and Demonstration Education comprehension: verbalized understanding, returned demonstration, and needs further education  HOME EXERCISE PROGRAM: Access Code: [email or web address] URL: [email or web address] Date: 06/13/2023 Prepared by: Marlon Paeton Latouche  Exercises - Side Stepping with Resistance at Thighs and Counter Support  - 1 x daily - 7 x weekly - 3 sets - 10 reps - Forward Backward Monster Walk with Band at [employer] and Counter Support  - 1 x daily - 7 x weekly - 3 sets - 10 reps - Bird Dog  - 1 x daily - 7 x weekly - 2 sets - 10 reps - Squat with Chair Touch  - 1 x daily - 7 x weekly - 2 sets - 10 reps  ASSESSMENT:  CLINICAL IMPRESSION: Emphasis of skilled PT session on establishing initial HEP for improved posterior chain strength, hip and core stability. Pt tolerated session well w/no increase in pain. Pt demonstrates decreased step clearance of LLE > RLE due to decreased weight shift to R. Continue POC.    OBJECTIVE IMPAIRMENTS: Abnormal gait, decreased balance, decreased knowledge of use of DME, decreased mobility, decreased ROM, decreased strength, impaired UE functional use, postural dysfunction, and pain.   ACTIVITY LIMITATIONS: carrying, lifting, bending, standing, stairs, transfers, and bed mobility  PARTICIPATION LIMITATIONS: meal prep, cleaning, driving, and community activity  PERSONAL FACTORS: Age and 1-2 comorbidities:    BPH, bronchiectasis, severe depression, anxiety and aortic atherosclerosisare also  affecting patient's functional outcome.   REHAB POTENTIAL: Good  CLINICAL DECISION MAKING: Stable/uncomplicated  EVALUATION COMPLEXITY: Low   GOALS: Goals reviewed with patient? Yes  SHORT TERM GOALS: Target date: 06/28/2023   Pt will be independent with initial HEP for improved strength, balance, transfers and gait. Baseline: Goal status: INITIAL  2.  Pt will improve 5 x STS to less than or equal to 22 seconds to demonstrate improved functional strength and transfer efficiency.  Baseline: 26.18 sec no UE (1/3) Goal status: INITIAL  3.  Pt will improve gait velocity to at least 3.25 ft/sec for improved gait efficiency and performance at mod I level  Baseline: 2.92 ft/sec with rollator (1/3) Goal status: INITIAL  4.  Pt will improve normal TUG to less than or equal to 19 seconds for improved functional mobility and decreased fall risk. Baseline: 22.62 sec with rollator (1/3) Goal status: INITIAL  5.  Pt will improve Berg score to 37/56 for decreased fall risk Baseline: 33/56 (1/3) Goal status: INITIAL   LONG TERM GOALS: Target date: 07/19/2023    Pt will be independent with final HEP for improved strength, balance, transfers and gait. Baseline:  Goal status: INITIAL  2.  Pt will improve 5 x STS to less than or equal to 18 seconds to demonstrate improved functional strength and transfer efficiency.  Baseline: 26.18 sec no UE (1/3) Goal status: INITIAL  3.  Pt will improve gait velocity to at least 3.5 ft/sec for improved gait efficiency and performance at mod I level  Baseline: 2.92 ft/sec with rollator (1/3) Goal status: INITIAL  4.  Pt will improve normal TUG to less than or equal to 16 seconds for improved functional mobility and decreased fall risk. Baseline: 22.62 sec with rollator (1/3) Goal status: INITIAL  5.  Pt will improve Berg score to 41/56 for decreased fall risk Baseline: 33/56 (1/3) Goal status: INITIAL     PLAN:  PT FREQUENCY:  2x/week  PT DURATION: 6 weeks  PLANNED INTERVENTIONS: 02835- PT Re-evaluation, 97110-Therapeutic exercises,  02469- Therapeutic activity, [id number]- Neuromuscular re-education, [phone]- Self Care, 02859- Manual therapy, [phone]- Gait training, Patient/Family education, Balance training, Stair training, Taping, Dry Needling, Scar mobilization, DME instructions, Cryotherapy, and Moist heat  PLAN FOR NEXT SESSION: pt goal is to get away from rollator; add to HEP to work on functional strengthening and balance (sit to stands, balance with decreased UE support), lifting and functional strength    Bari Handshoe E Iwalani Templeton, PT, DPT  06/13/2023, 4:17 PM

## 2023-06-17 ENCOUNTER — Ambulatory Visit: Payer: Medicare Other | Admitting: Physical Therapy

## 2023-06-17 ENCOUNTER — Ambulatory Visit: Payer: Medicare Other | Admitting: Occupational Therapy

## 2023-06-17 ENCOUNTER — Encounter: Payer: Self-pay | Admitting: Physical Therapy

## 2023-06-17 DIAGNOSIS — R2681 Unsteadiness on feet: Secondary | ICD-10-CM

## 2023-06-17 DIAGNOSIS — M6281 Muscle weakness (generalized): Secondary | ICD-10-CM | POA: Diagnosis not present

## 2023-06-17 DIAGNOSIS — M25551 Pain in right hip: Secondary | ICD-10-CM

## 2023-06-17 DIAGNOSIS — R2689 Other abnormalities of gait and mobility: Secondary | ICD-10-CM

## 2023-06-17 NOTE — Therapy (Addendum)
 OUTPATIENT OCCUPATIONAL THERAPY NEURO Treatment  Patient Name: Jeremy [name] MRN: [id number] DOB:[birth year], 88 y.o., male Today's Date: 06/17/2023  PCP: Gerome Brunet, DO  REFERRING PROVIDER: Pegge Toribio PARAS, PA-C   END OF SESSION:  OT End of Session - 06/17/23 1636     Visit Number 2    Number of Visits 7   including eval   Date for OT Re-Evaluation 08/02/23    Authorization Type [employer] [id number]    OT Start Time 1530    OT Stop Time 1610    OT Time Calculation (min) 40 min    Activity Tolerance Patient tolerated treatment well    Behavior During Therapy Flat affect              Past Medical History:  Diagnosis Date   Aortic atherosclerosis (HCC)    BPH (benign prostatic hyperplasia)    Bronchiectasis (HCC)    Depression    Depression    GAD (generalized anxiety disorder)    Past Surgical History:  Procedure Laterality Date   APPENDECTOMY     HERNIA REPAIR     TOTAL HIP ARTHROPLASTY Right 05/21/2023   Procedure: RIGHT TOTAL HIP ARTHROPLASTY;  Surgeon: Vernetta Lonni GRADE, MD;  Location: [facility] OR;  Service: Orthopedics;  Laterality: Right;   Patient Active Problem List   Diagnosis Date Noted   Malnutrition of moderate degree 05/23/2023   Closed right hip fracture (HCC) 05/23/2023   Closed subcapital fracture of right femur (HCC) 05/21/2023   Closed right hip fracture, initial encounter (HCC) 05/20/2023   Normocytic anemia 05/20/2023   Bronchiectasis (HCC) 05/20/2023   First degree AV block 05/20/2023   Anxiety and depression 05/20/2023   BPH (benign prostatic hyperplasia) 05/20/2023   Severe depression (HCC) 06/09/2021   Generalized anxiety disorder 06/09/2021   History of psychosis 06/09/2021   Insomnia 09/11/2020    ONSET DATE: 05/30/2023  (referral date), 05/21/23 (date of R total hip arthroplasty)  REFERRING DIAG: S72.001A (ICD-10-CM) - Fracture of unspecified part of neck of right femur, initial encounter for closed fracture   THERAPY DIAG:   Muscle weakness (generalized)  Unsteadiness on feet  Pain in right hip  Rationale for Evaluation and Treatment: Rehabilitation  SUBJECTIVE:   SUBJECTIVE STATEMENT: Pt no changes to medication and no recent falls.  Pt accompanied by: self   PERTINENT HISTORY: s/p right total hip arthroplasty (05/21/23) to treat an impacted acute right hip femoral neck fracture after mechanical fall, chronic back pain, bronchiectasis, severe depression, anxiety, aortic atherosclerosis, iron deficiency, hyponatremia, leukocytosis  PRECAUTIONS: Fall  WEIGHT BEARING RESTRICTIONS: Yes    Per 05/23/23 PT Progress Notes: Post op he is WBAT on RLE with no hip precautions.   PAIN:  Are you having pain? Yes: NPRS scale: 3-4/10 Pain location: R lateral thigh Pain description: aching Aggravating factors: not completing exercises Relieving factors: rest  FALLS: Has patient fallen in last 6 months? Yes. Number of falls 3 Pt reported foot slipping when trying to get dressed for 2-3 of the falls.  Per 06/03/23 PA-C D/C Summary: Pt sustained a mechanical fall at home on 05/20/2023. Imaging significant for displaced right femoral neck fracture. No other injuries. Per 06/03/23 MD Progress Notes: s/p right total hip arthroplasty (05/21/23) to treat an impacted acute right hip femoral neck fracture  LIVING ENVIRONMENT: Lives with: lives with their family Lives in: House/apartment Stairs: Yes: External: a couple steps; none (uses post as railing) Has following equipment at home: Vannie - 4 wheeled, Grab bars, and shower  chair without back, walk-in shower and tub/shower  PLOF:  requires assistance with some BADLs/IADLs , does not drive  PATIENT GOALS: to be as independent as possible.  OBJECTIVE:  Note: Objective measures were completed at Evaluation unless otherwise noted.  HAND DOMINANCE: Left  ADLs: Overall ADLs: requires some assistance Eating: ind Grooming: requires assistance for brushing  teeth, shaving d/t long-term depression and tremors of BUE UB Dressing: ind, assistance required for buttons/zippers LB Dressing: requires assistance for pants/socks/shoes Toileting: requires assistance Bathing: requires assistance, currently sponge bathing Tub Shower transfers: not completing d/t pt sponge bathing Equipment: Shower seat without back, Grab bars, Walk in shower, and tub/shower  IADLs: Shopping: dependent Light housekeeping: dependent Meal Prep: dependent Community mobility: not currently driving Medication management: dependent Handwriting:  Pt reported difficulty with handwriting d/t resting hand tremors. Pt wrote x1 simple sentence with approx. 60% legibility. OT noted wavering line quality d/t tremors of BUE and decreased spacing between words.  MOBILITY STATUS: Independent, uses rollator walker for home and community mobility  POSTURE COMMENTS:  Ind seated posture  ACTIVITY TOLERANCE: Activity tolerance: more fatigued since hip surgery though pt reported I had already been having trouble.  FUNCTIONAL OUTCOME MEASURES: PSFS : 2.0    UPPER EXTREMITY ROM:    Active ROM Right eval Left eval  Shoulder flexion 105* 115*  Shoulder abduction 118* 118*  Shoulder adduction    Shoulder extension    Shoulder internal rotation    Shoulder external rotation    Elbow flexion [facility] WFL  Elbow extension [facility] WFL  Wrist flexion [facility] WFL  Wrist extension [facility] WFL  Wrist ulnar deviation impaired impaired  Wrist radial deviation impaired impaired  Wrist pronation [facility] WFL  Wrist supination WFL WFL  (Blank rows = not tested)  LUE: Pointer finger on LUE  - PIP slight hyperext Enlarged MCP joints of digits 1-5 PIP joint hyperext severe for digit 3-5 Pt able to achieve neutral ext (0*) of PIP joints of LUE with gentle PROM. Pt c/o slight discomfort if flexing digits past neutral ext (0* ext).  RUE: Pinky finger on RUE - PIP slight hyperext Enlarged MCP joints of  digits 1-5 PIP joint hyperext severe for digit 2-4 Pt able to achieve neutral ext (0*) of PIP joints of LUE with gentle PROM. Pt c/o slight discomfort if flexing digits past neutral ext (0* ext).  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 Pt reported hx of arthritis. Pt unable to make full composite fist secondary to PIP joint hyperext of BUE.   Pt able to oppose digit 5 to thumb of BUE.  Pt able to pinch with thumb and pointer finger and thumb and middle finger of BUE.   UPPER EXTREMITY MMT:     MMT Right eval Left eval  Shoulder flexion 4+ 4+  Shoulder abduction    Shoulder adduction    Shoulder extension    Shoulder internal rotation    Shoulder external rotation    Middle trapezius    Lower trapezius    Elbow flexion    Elbow extension    Wrist flexion    Wrist extension    Wrist ulnar deviation    Wrist radial deviation    Wrist pronation    Wrist supination    (Blank rows = not tested)  HAND FUNCTION: Grip and pinch strength not tested d/t deformity of B PIP joints.  COORDINATION: 9 Hole Peg test: Right: 59 sec; Left: 53 sec  SENSATION: Pt reported  mild tingling of B hands.  EDEMA: None noted.  MUSCLE TONE: RUE: Within functional limits and LUE: Within functional limits  COGNITION: Overall cognitive status: Within functional limits for tasks assessed  PERCEPTION: Not tested  PRAXIS: Not tested  OBSERVATIONS:  OT noted pt demo'd resting tremor of BUE, more pronounced of LUE.  OT noted hyperextension of BUE PIP joints with pt reporting hx of swan neck deformity. Pt demo'd increased difficulty with functional grip/pinch d/t BUE deformity though able to pick up pegs for 9-hole peg test indicating functional pinch.  Pt reported previously receiving hand therapy to address swan neck deformity at [name]. Pt reported also seeing rheumatologist. Pt reported there wasn't much progress as a result of therapy and pt reported  previously trying splinting in the past without much success.  Pt ambulated with A/E.                                                                                                                            TREATMENT DATE:   Self Care Pt reported feeling hesitant about OT and reiterated hope that sessions would focus on addressing the recent hip surgery. Therefore OT reviewed specific goals with pt and educated pt on OT role. Pt verbalized understanding and agreed with goals.  OT educated pt on tremor reduction strategies for ADLs/IADLs, Handout provided, see pt instructions. OT showed example of weighted writing utensil option and educated on separating letters with large letters to improve legibility for handwriting. Pt verbalized understanding and trialed using built-up handle weighted writing utensils.  LB dressing tasks, seated: don/doff shoes, simulated don/doff pants using yellow Theraband loop - OT educated pt on strategies and A/E options of reacher, dressing stick, and 6-inch step stool to complete LB dressing tasks. Pt returned demonstration of using each A/E and adaptive strategies to complete LB dressing tasks with fading v/c, increased ind and improved efficiency with each subsequent trial. Pt verbalized preference for using reacher instead of dressing stick and demo'd more efficient movements with reacher compared to dressing stick.  OT educated pt on strategies to reduce fall risk. Pt verbalized understanding.  At end of OT session, pt reported feeling more reassured about role of OT to address areas of need related to recent hip surgery.   PATIENT EDUCATION: Education details: see today's treatment above Person educated: Patient and Spouse Education method: Explanation Education comprehension: verbalized understanding  HOME EXERCISE PROGRAM: 06/17/23 - Practice LB dressing with yellow theraband loop and actual pants/shoes using reacher and step stool   GOALS: Goals  reviewed with patient? Yes  SHORT TERM GOALS: Target date: 07/05/23  Pt will be ind with initial general strengthening/endurance HEP Baseline: new to outpt OT Goal status: INITIAL  2.  Pt will demo improved standing tolerance as evidenced by standing for at least 5 minutes while participating in functional task at tabletop with no more than supervision assist. Baseline: Pt reported decreased strength and endurance after R hip surgery. Goal  status: INITIAL  3.  Pt will verbalize understanding of tremor reduction strategies to increase ind for handwriting and grooming ADL considerations. Baseline: new to outpt OT Goal status: INITIAL  4.  Pt will verbalize understanding of A/E options for bathing ADL tasks, including but not limited to transfer bench for tub shower, shower stool, long-handled sponge. Baseline: Pt currently participating in sponge-bathing with assistance to complete bathing ADL. Goal status: INITIAL  5.  Pt will verbalize understanding of A/E options for toileting ADL tasks, including but not limited to bidet, toilet aid, reacher for clothing management. Baseline: Assistance required Goal status: INITIAL  6.  Pt will verbalize understanding of A/E options to improve ability to pick up items from floor.  Baseline: New to outpt OT. Pt reported moderate difficulty to pick up items from floor and reported PSFS individual score for picking up items from floor as 1 out of 10. Goal status: INITIAL  LONG TERM GOALS: Target date: 08/02/23  Pt will be ind with general strengthening/endurance HEP Baseline: new to outpt OT Goal status: INITIAL  2.  Pt will demo understanding of safe LB dressing strategies using adaptive strategies and A/E PRN, including completion of LB dressing while seated. Baseline: Pt reported currently requiring assistance for LB dressing tasks. Pt reported hx of previous falls d/t attempting to dress while standing.  Goal status: INITIAL  3. Patient will  report at least two-point increase in average PSFS score or at least three-point increase in a single activity score indicating functionally significant improvement given minimum detectable change. Baseline: PSFS: 2.0 total score (See above for individual activity scores)  Goal status: INITIAL  ASSESSMENT:  CLINICAL IMPRESSION: Pt tolerated tasks well and demo'd improved efficiency for LB dressing tasks using adaptive strategies and A/E, including use of reacher and step stool. Pt would benefit from skilled OT services in the outpatient setting to work on impairments as noted below, to increase ind for ADL tasks, to improve standing tolerance/endurance, and to improve understanding of adaptive strategies and A/E for ADL/IADL tasks.   PERFORMANCE DEFICITS: in functional skills including ADLs, IADLs, coordination, dexterity, proprioception, sensation, ROM, strength, pain, flexibility, Fine motor control, Gross motor control, mobility, balance, body mechanics, endurance, decreased knowledge of use of DME, and UE functional use, cognitive skills including energy/drive, and psychosocial skills including environmental adaptation.   IMPAIRMENTS: are limiting patient from ADLs, IADLs, leisure, and social participation.   CO-MORBIDITIES: may have co-morbidities  that affects occupational performance. Patient will benefit from skilled OT to address above impairments and improve overall function.  MODIFICATION OR ASSISTANCE TO COMPLETE EVALUATION: Min-Moderate modification of tasks or assist with assess necessary to complete an evaluation.  OT OCCUPATIONAL PROFILE AND HISTORY: Problem focused assessment: Including review of records relating to presenting problem.  CLINICAL DECISION MAKING: LOW - limited treatment options, no task modification necessary  REHAB POTENTIAL: Good  EVALUATION COMPLEXITY: Low    PLAN:  OT FREQUENCY: 1x/week  OT DURATION: 6 weeks (dates extended to allow for  scheduling)  PLANNED INTERVENTIONS: 97168 OT Re-evaluation, 97535 self care/ADL training, 02889 therapeutic exercise, 97530 therapeutic activity, 97112 neuromuscular re-education, 97140 manual therapy, 97760 Orthotics management and training, [id number] Splinting (initial encounter), [phone] Subsequent splinting/medication, passive range of motion, functional mobility training, energy conservation, patient/family education, and DME and/or AE instructions  RECOMMENDED OTHER SERVICES: PT eval completed  CONSULTED AND AGREED WITH PLAN OF CARE: Patient and family member/caregiver  PLAN FOR NEXT SESSION:   Educate on A/E options: transfer bench for tub shower,  shower stool, long-handled sponge, bidet, toilet aid   Review LB dressing adaptive strategies to increase ind PRN - reacher and step stool  Initiate general strength/endurance HEP : trunk/BUE - avoid theraband and other resistive materials secondary to PIP hyperext of BUE digits   Geofm FORBES Coder, OT 06/17/2023, 4:50 PM

## 2023-06-17 NOTE — Therapy (Signed)
 OUTPATIENT PHYSICAL THERAPY LOWER EXTREMITY TREATMENT   Patient Name: Jeremy [name] MRN: [id number] DOB:12/01/1935, 88 y.o., male Today's Date: 06/17/2023  END OF SESSION:  PT End of Session - 06/17/23 1453     Visit Number 3    Number of Visits 13   with eval   Date for PT Re-Evaluation 08/02/23    Authorization Type Medicare    Progress Note Due on Visit 10    PT Start Time 1449    PT Stop Time 1527    PT Time Calculation (min) 38 min    Equipment Utilized During Treatment Gait belt    Activity Tolerance Patient tolerated treatment well    Behavior During Therapy Flat affect              Past Medical History:  Diagnosis Date   Aortic atherosclerosis (HCC)    BPH (benign prostatic hyperplasia)    Bronchiectasis (HCC)    Depression    Depression    GAD (generalized anxiety disorder)    Past Surgical History:  Procedure Laterality Date   APPENDECTOMY     HERNIA REPAIR     TOTAL HIP ARTHROPLASTY Right 05/21/2023   Procedure: RIGHT TOTAL HIP ARTHROPLASTY;  Surgeon: Vernetta Lonni GRADE, MD;  Location: [facility] OR;  Service: Orthopedics;  Laterality: Right;   Patient Active Problem List   Diagnosis Date Noted   Malnutrition of moderate degree 05/23/2023   Closed right hip fracture (HCC) 05/23/2023   Closed subcapital fracture of right femur (HCC) 05/21/2023   Closed right hip fracture, initial encounter (HCC) 05/20/2023   Normocytic anemia 05/20/2023   Bronchiectasis (HCC) 05/20/2023   First degree AV block 05/20/2023   Anxiety and depression 05/20/2023   BPH (benign prostatic hyperplasia) 05/20/2023   Severe depression (HCC) 06/09/2021   Generalized anxiety disorder 06/09/2021   History of psychosis 06/09/2021   Insomnia 09/11/2020    PCP: Gerome Brunet, DO  REFERRING PROVIDER: Pegge Toribio PARAS, PA-C  REFERRING DIAG: S72.001A (ICD-10-CM) - Fracture of unspecified part of neck of right femur, initial encounter for closed fracture  THERAPY DIAG:  Muscle  weakness (generalized)  Unsteadiness on feet  Pain in right hip  Other abnormalities of gait and mobility  Rationale for Evaluation and Treatment: Rehabilitation  ONSET DATE: 05/30/2023 (referral date)  SUBJECTIVE:   SUBJECTIVE STATEMENT: Pt presents w/rollator. Denies acute changes. Pain is 4/10 today.  Pt reports he has not tried HEP at home yet.   Wife: [name] (in lobby)   PERTINENT HISTORY: PMH: BPH, bronchiectasis, severe depression, anxiety and aortic atherosclerosis  PAIN:  Are you having pain? Yes: NPRS scale: 4/10 Pain location: R mid-thigh to upper anterior hip Pain description: soreness Aggravating factors: certain movements Relieving factors: resting  PRECAUTIONS: Fall  RED FLAGS: None   WEIGHT BEARING RESTRICTIONS: Yes WBAT RLE  FALLS:  Has patient fallen in last 6 months? Yes. Number of falls 3 in the past 6 months, feel like legs slip out from under him; usually needs help to get back up  LIVING ENVIRONMENT: Lives with: lives with their spouse Lives in: House/apartment Stairs: Yes: External: 3-4 steps; has a post to hold onto Has following equipment at home: Vannie - 4 wheeled, shower chair, and Grab bars  OCCUPATION: retired  PLOF: Independent with gait, Independent with transfers, and Requires assistive device for independence  PATIENT GOALS: walk independently  NEXT MD VISIT: sees hip surgeon in 4 weeks (06/30/22)  OBJECTIVE:  Note: Objective measures were completed at Evaluation unless  otherwise noted.  DIAGNOSTIC FINDINGS:  R hip xray prior to surgery 05/20/23 FINDINGS: SI joints are non widened. Pubic symphysis and rami appear intact. Acute right femoral neck fracture. No femoral head dislocation   IMPRESSION: Acute right femoral neck fracture.  COGNITION: Overall cognitive status: Difficulty to assess due to: flat affect       POSTURE: rounded shoulders, forward head, and posterior pelvic tilt   LOWER EXTREMITY  ROM:  Active ROM Right eval Left eval  Hip flexion Decreased due to pain   Hip extension    Hip abduction    Hip adduction    Hip internal rotation    Hip external rotation    Knee flexion    Knee extension    Ankle dorsiflexion    Ankle plantarflexion    Ankle inversion    Ankle eversion     (Blank rows = not tested)  LOWER EXTREMITY MMT:  MMT Right eval Left eval  Hip flexion 4 5  Hip extension    Hip abduction    Hip adduction    Hip internal rotation    Hip external rotation    Knee flexion 4 5  Knee extension 4 5  Ankle dorsiflexion 5 5  Ankle plantarflexion    Ankle inversion    Ankle eversion     (Blank rows = not tested)                                                                                                                                 TREATMENT: Ther Ex  SciFit multi-peaks level 5.0 for 8 minutes using BUE/BLEs for neural priming for reciprocal movement, dynamic cardiovascular warmup and improved ROM of RLE (large amplitude). Pt is self-aware of consistent RLE knee extension.  RPE of 3-4/10 following activity. STS w/ stride sit relying on RLE in rear 2x12 Attempted forward T w/ BUE support in // bars, but pt too weak for this high level task > regressed to kickstand mini SL squat w/ BUE support x10 each LE 2lb dowel deadlift to 12 box 2x10 > attempted 8 w/ pt lacking flexibility and control to safety get to this height Pt needs rest and water breaks throughout.  Gait pattern: step through pattern, decreased stride length, decreased ankle dorsiflexion- Right, decreased ankle dorsiflexion- Left, scissoring, lateral hip instability, narrow BOS, poor foot clearance- Right, and poor foot clearance- Left Distance walked: Various clinic distances  Assistive device utilized: Walker - 4 wheeled Level of assistance: Modified independence Comments: Noted scissoring of RLE and decreased eccentric control w/IC and FF on LLE. No LOB noted    PATIENT  EDUCATION:  Education details: Continue HEP.  Rollator safety especially when approaching sitting - brake management. Person educated: Patient and Spouse Education method: Medical Illustrator Education comprehension: verbalized understanding, returned demonstration, and needs further education  HOME EXERCISE PROGRAM: Access Code: [email or web address] URL: [email or web address] Date: 06/13/2023 Prepared by: Marlon Plaster  Exercises -  Side Stepping with Resistance at Thighs and Counter Support  - 1 x daily - 7 x weekly - 3 sets - 10 reps - Forward Backward Monster Walk with Band at Thighs and Counter Support  - 1 x daily - 7 x weekly - 3 sets - 10 reps - Bird Dog  - 1 x daily - 7 x weekly - 2 sets - 10 reps - Squat with Chair Touch  - 1 x daily - 7 x weekly - 2 sets - 10 reps  ASSESSMENT:  CLINICAL IMPRESSION: Focus of skilled session today on functional strengthening.  PT did not add to HEP to allow pt time to commit to exercises already established.  He continues to be mildly limited by fatigue, but tolerates all high level challenges well.  He would benefit from deadlift progression for physical load challenge as well as flexibility and functional strength.  He was limited in depth of deadlift this visit.  Will continue per POC.   OBJECTIVE IMPAIRMENTS: Abnormal gait, decreased balance, decreased knowledge of use of DME, decreased mobility, decreased ROM, decreased strength, impaired UE functional use, postural dysfunction, and pain.   ACTIVITY LIMITATIONS: carrying, lifting, bending, standing, stairs, transfers, and bed mobility  PARTICIPATION LIMITATIONS: meal prep, cleaning, driving, and community activity  PERSONAL FACTORS: Age and 1-2 comorbidities:    BPH, bronchiectasis, severe depression, anxiety and aortic atherosclerosisare also affecting patient's functional outcome.   REHAB POTENTIAL: Good  CLINICAL DECISION MAKING: Stable/uncomplicated  EVALUATION  COMPLEXITY: Low   GOALS: Goals reviewed with patient? Yes  SHORT TERM GOALS: Target date: 06/28/2023   Pt will be independent with initial HEP for improved strength, balance, transfers and gait. Baseline: Goal status: INITIAL  2.  Pt will improve 5 x STS to less than or equal to 22 seconds to demonstrate improved functional strength and transfer efficiency.  Baseline: 26.18 sec no UE (1/3) Goal status: INITIAL  3.  Pt will improve gait velocity to at least 3.25 ft/sec for improved gait efficiency and performance at mod I level  Baseline: 2.92 ft/sec with rollator (1/3) Goal status: INITIAL  4.  Pt will improve normal TUG to less than or equal to 19 seconds for improved functional mobility and decreased fall risk. Baseline: 22.62 sec with rollator (1/3) Goal status: INITIAL  5.  Pt will improve Berg score to 37/56 for decreased fall risk Baseline: 33/56 (1/3) Goal status: INITIAL   LONG TERM GOALS: Target date: 07/19/2023    Pt will be independent with final HEP for improved strength, balance, transfers and gait. Baseline:  Goal status: INITIAL  2.  Pt will improve 5 x STS to less than or equal to 18 seconds to demonstrate improved functional strength and transfer efficiency.  Baseline: 26.18 sec no UE (1/3) Goal status: INITIAL  3.  Pt will improve gait velocity to at least 3.5 ft/sec for improved gait efficiency and performance at mod I level  Baseline: 2.92 ft/sec with rollator (1/3) Goal status: INITIAL  4.  Pt will improve normal TUG to less than or equal to 16 seconds for improved functional mobility and decreased fall risk. Baseline: 22.62 sec with rollator (1/3) Goal status: INITIAL  5.  Pt will improve Berg score to 41/56 for decreased fall risk Baseline: 33/56 (1/3) Goal status: INITIAL     PLAN:  PT FREQUENCY: 2x/week  PT DURATION: 6 weeks  PLANNED INTERVENTIONS: [id number]- PT Re-evaluation, 97110-Therapeutic exercises, 97530- Therapeutic activity,  [id number]- Neuromuscular re-education, 97535- Self Care, [id number]- Manual therapy, [id number]-  Gait training, Patient/Family education, Balance training, Stair training, Taping, Dry Needling, Scar mobilization, DME instructions, Cryotherapy, and Moist heat  PLAN FOR NEXT SESSION: pt goal is to get away from rollator; add to HEP to work on functional strengthening and balance (sit to stands, balance with decreased UE support), lifting and functional strength , progress deadlift height, standing to taps, tilt board, forward bend to cone tap, step out slam balls, leg press?   Daved KATHEE Bull, PT, DPT  06/17/2023, 3:26 PM

## 2023-06-18 DIAGNOSIS — E559 Vitamin D deficiency, unspecified: Secondary | ICD-10-CM | POA: Diagnosis not present

## 2023-06-18 DIAGNOSIS — R7309 Other abnormal glucose: Secondary | ICD-10-CM | POA: Diagnosis not present

## 2023-06-18 DIAGNOSIS — H6123 Impacted cerumen, bilateral: Secondary | ICD-10-CM | POA: Diagnosis not present

## 2023-06-18 DIAGNOSIS — M810 Age-related osteoporosis without current pathological fracture: Secondary | ICD-10-CM | POA: Diagnosis not present

## 2023-06-18 DIAGNOSIS — N400 Enlarged prostate without lower urinary tract symptoms: Secondary | ICD-10-CM | POA: Diagnosis not present

## 2023-06-18 DIAGNOSIS — Z79899 Other long term (current) drug therapy: Secondary | ICD-10-CM | POA: Diagnosis not present

## 2023-06-18 DIAGNOSIS — Z0000 Encounter for general adult medical examination without abnormal findings: Secondary | ICD-10-CM | POA: Diagnosis not present

## 2023-06-18 DIAGNOSIS — F329 Major depressive disorder, single episode, unspecified: Secondary | ICD-10-CM | POA: Diagnosis not present

## 2023-06-18 DIAGNOSIS — E538 Deficiency of other specified B group vitamins: Secondary | ICD-10-CM | POA: Diagnosis not present

## 2023-06-18 DIAGNOSIS — R972 Elevated prostate specific antigen [PSA]: Secondary | ICD-10-CM | POA: Diagnosis not present

## 2023-06-18 DIAGNOSIS — Z9289 Personal history of other medical treatment: Secondary | ICD-10-CM | POA: Diagnosis not present

## 2023-06-18 DIAGNOSIS — R946 Abnormal results of thyroid function studies: Secondary | ICD-10-CM | POA: Diagnosis not present

## 2023-06-19 ENCOUNTER — Encounter: Payer: Self-pay | Admitting: Physical Therapy

## 2023-06-19 ENCOUNTER — Ambulatory Visit: Payer: Medicare Other | Admitting: Physical Therapy

## 2023-06-19 VITALS — BP 134/57 | HR 73

## 2023-06-19 DIAGNOSIS — R2689 Other abnormalities of gait and mobility: Secondary | ICD-10-CM | POA: Diagnosis not present

## 2023-06-19 DIAGNOSIS — M6281 Muscle weakness (generalized): Secondary | ICD-10-CM | POA: Diagnosis not present

## 2023-06-19 DIAGNOSIS — M25551 Pain in right hip: Secondary | ICD-10-CM | POA: Diagnosis not present

## 2023-06-19 DIAGNOSIS — R2681 Unsteadiness on feet: Secondary | ICD-10-CM | POA: Diagnosis not present

## 2023-06-19 NOTE — Therapy (Signed)
 OUTPATIENT PHYSICAL THERAPY LOWER EXTREMITY TREATMENT   Patient Name: Jeremy [name] MRN: [id number] DOB:11-25-1935, 88 y.o., male Today's Date: 06/19/2023  END OF SESSION:  PT End of Session - 06/19/23 1413     Visit Number 4    Number of Visits 13   with eval   Date for PT Re-Evaluation 08/02/23    Authorization Type Medicare    Progress Note Due on Visit 10    PT Start Time 1405   pt arrived late   PT Stop Time 1445    PT Time Calculation (min) 40 min    Equipment Utilized During Treatment Gait belt    Activity Tolerance Patient tolerated treatment well;Patient limited by fatigue    Behavior During Therapy Flat affect              Past Medical History:  Diagnosis Date   Aortic atherosclerosis (HCC)    BPH (benign prostatic hyperplasia)    Bronchiectasis (HCC)    Depression    Depression    GAD (generalized anxiety disorder)    Past Surgical History:  Procedure Laterality Date   APPENDECTOMY     HERNIA REPAIR     TOTAL HIP ARTHROPLASTY Right 05/21/2023   Procedure: RIGHT TOTAL HIP ARTHROPLASTY;  Surgeon: Jeremy Lao, MD;  Location: [facility] OR;  Service: Orthopedics;  Laterality: Right;   Patient Active Problem List   Diagnosis Date Noted   Malnutrition of moderate degree 05/23/2023   Closed right hip fracture (HCC) 05/23/2023   Closed subcapital fracture of right femur (HCC) 05/21/2023   Closed right hip fracture, initial encounter (HCC) 05/20/2023   Normocytic anemia 05/20/2023   Bronchiectasis (HCC) 05/20/2023   First degree AV block 05/20/2023   Anxiety and depression 05/20/2023   BPH (benign prostatic hyperplasia) 05/20/2023   Severe depression (HCC) 06/09/2021   Generalized anxiety disorder 06/09/2021   History of psychosis 06/09/2021   Insomnia 09/11/2020    PCP: Jeremy Brand, DO  REFERRING PROVIDER: Sterling Eisenmenger, PA-C  REFERRING DIAG: S72.001A (ICD-10-CM) - Fracture of unspecified part of neck of right femur, initial encounter for  closed fracture  THERAPY DIAG:  Muscle weakness (generalized)  Unsteadiness on feet  Pain in right hip  Other abnormalities of gait and mobility  Rationale for Evaluation and Treatment: Rehabilitation  ONSET DATE: 05/30/2023 (referral date)  SUBJECTIVE:   SUBJECTIVE STATEMENT: Pt presents w/rollator. Denies acute changes. Pain is 4/10 today - more constant than before and feels like soreness.  Pt replies "no" when asked if he has done any of his HEP.  He further states "my wife tries".  He reports he just doesn't feel like it.  He reports he feels mildly light-headed today and would like some water at onset of session.   Wife: Jeremy [name] (in lobby)   PERTINENT HISTORY: PMH: BPH, bronchiectasis, severe depression, anxiety and aortic atherosclerosis  PAIN:  Are you having pain? Yes: NPRS scale: 4/10 Pain location: R mid-thigh to upper anterior hip Pain description: soreness Aggravating factors: certain movements Relieving factors: resting  PRECAUTIONS: Fall  RED FLAGS: None   WEIGHT BEARING RESTRICTIONS: Yes WBAT RLE  FALLS:  Has patient fallen in last 6 months? Yes. Number of falls 3 in the past 6 months, feel like legs slip out from under him; usually needs help to get back up  LIVING ENVIRONMENT: Lives with: lives with their spouse Lives in: House/apartment Stairs: Yes: External: 3-4 steps; has a post to hold onto Has following equipment at home: Otho Blitz -  4 wheeled, shower chair, and Grab bars  OCCUPATION: retired  PLOF: Independent with gait, Independent with transfers, and Requires assistive device for independence  PATIENT GOALS: "walk independently"  NEXT MD VISIT: sees hip surgeon in 4 weeks (06/30/22)  OBJECTIVE:  Note: Objective measures were completed at Evaluation unless otherwise noted.  DIAGNOSTIC FINDINGS:  R hip xray prior to surgery 05/20/23 FINDINGS: SI joints are non widened. Pubic symphysis and rami appear intact. Acute right femoral neck  fracture. No femoral head dislocation   IMPRESSION: Acute right femoral neck fracture.  COGNITION: Overall cognitive status: Difficulty to assess due to: flat affect       POSTURE: rounded shoulders, forward head, and posterior pelvic tilt   LOWER EXTREMITY ROM:  Active ROM Right eval Left eval  Hip flexion Decreased due to pain   Hip extension    Hip abduction    Hip adduction    Hip internal rotation    Hip external rotation    Knee flexion    Knee extension    Ankle dorsiflexion    Ankle plantarflexion    Ankle inversion    Ankle eversion     (Blank rows = not tested)  LOWER EXTREMITY MMT:  MMT Right eval Left eval  Hip flexion 4 5  Hip extension    Hip abduction    Hip adduction    Hip internal rotation    Hip external rotation    Knee flexion 4 5  Knee extension 4 5  Ankle dorsiflexion 5 5  Ankle plantarflexion    Ankle inversion    Ankle eversion     (Blank rows = not tested)                                                                                                                                 TREATMENT: LUE in sitting prior to interventions: Vitals:   06/19/23 1409  BP: (!) 134/57  Pulse: 73  Time allotted for pt to drink water, reporting he would like to start with the bike as he thinks it will make him feel better.  Ther Ex  SciFit multi-peaks level 5.0 for 8 minutes using BUE/BLEs for neural priming for reciprocal movement, dynamic cardiovascular warmup and improved ROM of RLE (large amplitude).  RPE of 5-6/10 following activity. Deadlift into progressively lowered height 18.5" > 14.5" > 8" w/ facilitation of hip hinge and reduced kyphotic posture x8 each height (pt has increased difficulty reaching lowest height without some upper back rounding)  NMR: Alternating midline 4" > 8" toe taps unsupported w/ intermittent CGA 3x20; cues to decrease pace due to forward drift and toe catch x1 Pt needs rest and water breaks  throughout.  Gait pattern: step through pattern, decreased stride length, decreased ankle dorsiflexion- Right, decreased ankle dorsiflexion- Left, scissoring, lateral hip instability, narrow BOS, poor foot clearance- Right, and poor foot clearance- Left Distance walked: Various clinic distances  Assistive device  utilized: Environmental consultant - 4 wheeled Level of assistance: Modified independence Comments: Noted scissoring of RLE and decreased eccentric control w/IC and FF on LLE. No LOB noted    PATIENT EDUCATION:  Education details: Continue HEP.  Rollator safety especially when approaching sitting - brake management. Person educated: Patient and Spouse Education method: Medical illustrator Education comprehension: verbalized understanding, returned demonstration, and needs further education  HOME EXERCISE PROGRAM: Access Code: [email or web address] URL: [email or web address] Date: 06/13/2023 Prepared by: Burleigh Carp Plaster  Exercises - Side Stepping with Resistance at Thighs and Counter Support  - 1 x daily - 7 x weekly - 3 sets - 10 reps - Forward Backward Monster Walk with Band at [employer] and Counter Support  - 1 x daily - 7 x weekly - 3 sets - 10 reps - Bird Dog  - 1 x daily - 7 x weekly - 2 sets - 10 reps - Squat with Chair Touch  - 1 x daily - 7 x weekly - 2 sets - 10 reps  ASSESSMENT:  CLINICAL IMPRESSION: Ongoing focus on functional strengthening of the gluts in bilateral and SLS.  Pt has mild improvement in dead lift form this visit, but is challenged by lowered target.  He did well in SLS with low step, but has to slow pace significantly with 8" step in order to maintain safe upright.  He continues to benefit from skilled PT to improve his overall activity tolerance and stability in order to optimize functional outcomes and determine safe progression to LRAD.  Will continue per POC.   OBJECTIVE IMPAIRMENTS: Abnormal gait, decreased balance, decreased knowledge of use of DME,  decreased mobility, decreased ROM, decreased strength, impaired UE functional use, postural dysfunction, and pain.   ACTIVITY LIMITATIONS: carrying, lifting, bending, standing, stairs, transfers, and bed mobility  PARTICIPATION LIMITATIONS: meal prep, cleaning, driving, and community activity  PERSONAL FACTORS: Age and 1-2 comorbidities:    BPH, bronchiectasis, severe depression, anxiety and aortic atherosclerosisare also affecting patient's functional outcome.   REHAB POTENTIAL: Good  CLINICAL DECISION MAKING: Stable/uncomplicated  EVALUATION COMPLEXITY: Low   GOALS: Goals reviewed with patient? Yes  SHORT TERM GOALS: Target date: 06/28/2023   Pt will be independent with initial HEP for improved strength, balance, transfers and gait. Baseline: Goal status: INITIAL  2.  Pt will improve 5 x STS to less than or equal to 22 seconds to demonstrate improved functional strength and transfer efficiency.  Baseline: 26.18 sec no UE (1/3) Goal status: INITIAL  3.  Pt will improve gait velocity to at least 3.25 ft/sec for improved gait efficiency and performance at mod I level  Baseline: 2.92 ft/sec with rollator (1/3) Goal status: INITIAL  4.  Pt will improve normal TUG to less than or equal to 19 seconds for improved functional mobility and decreased fall risk. Baseline: 22.62 sec with rollator (1/3) Goal status: INITIAL  5.  Pt will improve Berg score to 37/56 for decreased fall risk Baseline: 33/56 (1/3) Goal status: INITIAL   LONG TERM GOALS: Target date: 07/19/2023    Pt will be independent with final HEP for improved strength, balance, transfers and gait. Baseline:  Goal status: INITIAL  2.  Pt will improve 5 x STS to less than or equal to 18 seconds to demonstrate improved functional strength and transfer efficiency.  Baseline: 26.18 sec no UE (1/3) Goal status: INITIAL  3.  Pt will improve gait velocity to at least 3.5 ft/sec for improved gait efficiency and  performance at mod I level  Baseline: 2.92 ft/sec with rollator (1/3) Goal status: INITIAL  4.  Pt will improve normal TUG to less than or equal to 16 seconds for improved functional mobility and decreased fall risk. Baseline: 22.62 sec with rollator (1/3) Goal status: INITIAL  5.  Pt will improve Berg score to 41/56 for decreased fall risk Baseline: 33/56 (1/3) Goal status: INITIAL     PLAN:  PT FREQUENCY: 2x/week  PT DURATION: 6 weeks  PLANNED INTERVENTIONS: 40981- PT Re-evaluation, 97110-Therapeutic exercises, 97530- Therapeutic activity, 97112- Neuromuscular re-education, 97535- Self Care, 19147- Manual therapy, [phone]- Gait training, Patient/Family education, Balance training, Stair training, Taping, Dry Needling, Scar mobilization, DME instructions, Cryotherapy, and Moist heat  PLAN FOR NEXT SESSION: pt goal is to get away from rollator; add to HEP to work on functional strengthening and balance (sit to stands, balance with decreased UE support), lifting and functional strength , progress deadlift height, standing to taps, tilt board, forward bend to cone tap, step out slam balls, leg press?  Foam beam - ball toss, tandem, heel raise; reactive balance - perturbations in // bars, resisted STS > slam ball   Earlean Glaze, PT, DPT  06/19/2023, 4:20 PM

## 2023-06-24 ENCOUNTER — Ambulatory Visit: Payer: Medicare Other | Admitting: Physical Therapy

## 2023-06-24 ENCOUNTER — Ambulatory Visit: Payer: Medicare Other | Admitting: Occupational Therapy

## 2023-06-24 VITALS — BP 113/62 | HR 73

## 2023-06-24 DIAGNOSIS — M6281 Muscle weakness (generalized): Secondary | ICD-10-CM | POA: Diagnosis not present

## 2023-06-24 DIAGNOSIS — R2689 Other abnormalities of gait and mobility: Secondary | ICD-10-CM | POA: Diagnosis not present

## 2023-06-24 DIAGNOSIS — R2681 Unsteadiness on feet: Secondary | ICD-10-CM | POA: Diagnosis not present

## 2023-06-24 DIAGNOSIS — M25551 Pain in right hip: Secondary | ICD-10-CM

## 2023-06-24 NOTE — Therapy (Signed)
OUTPATIENT OCCUPATIONAL THERAPY NEURO Treatment  Patient Name: Jeremy [name] MRN: [id number] DOB:11-Nov-1935, 88 y.o., male Today's Date: 06/24/2023  PCP: Irena Reichmann, DO  REFERRING PROVIDER: Charlton Amor, PA-C   END OF SESSION:  OT End of Session - 06/24/23 1448     Visit Number 3    Number of Visits 7   including eval   Date for OT Re-Evaluation 08/02/23    Authorization Type [id number]    OT Start Time 1406    OT Stop Time 1433    OT Time Calculation (min) 27 min    Activity Tolerance Other (comment)   pt reported symptoms of nausea   Behavior During Therapy Flat affect               Past Medical History:  Diagnosis Date   Aortic atherosclerosis (HCC)    BPH (benign prostatic hyperplasia)    Bronchiectasis (HCC)    Depression    Depression    GAD (generalized anxiety disorder)    Past Surgical History:  Procedure Laterality Date   APPENDECTOMY     HERNIA REPAIR     TOTAL HIP ARTHROPLASTY Right 05/21/2023   Procedure: RIGHT TOTAL HIP ARTHROPLASTY;  Surgeon: Kathryne Hitch, MD;  Location: [facility] OR;  Service: Orthopedics;  Laterality: Right;   Patient Active Problem List   Diagnosis Date Noted   Malnutrition of moderate degree 05/23/2023   Closed right hip fracture (HCC) 05/23/2023   Closed subcapital fracture of right femur (HCC) 05/21/2023   Closed right hip fracture, initial encounter (HCC) 05/20/2023   Normocytic anemia 05/20/2023   Bronchiectasis (HCC) 05/20/2023   First degree AV block 05/20/2023   Anxiety and depression 05/20/2023   BPH (benign prostatic hyperplasia) 05/20/2023   Severe depression (HCC) 06/09/2021   Generalized anxiety disorder 06/09/2021   History of psychosis 06/09/2021   Insomnia 09/11/2020    ONSET DATE: 05/30/2023  (referral date), 05/21/23 (date of R total hip arthroplasty)  REFERRING DIAG: S72.001A (ICD-10-CM) - Fracture of unspecified part of neck of right femur, initial encounter for closed fracture    THERAPY DIAG:  Muscle weakness (generalized)  Unsteadiness on feet  Pain in right hip  Rationale for Evaluation and Treatment: Rehabilitation  SUBJECTIVE:   SUBJECTIVE STATEMENT: Pt reported "feeling grumpy" today. Pt reported feeling "okay" in general.  Pt reported he has not yet used recommended devices for LB dressing though reported improved ability to dress after review of strategies at previous OT session. Pt reported completing all LB dressing tasks seated.   Pt reported taking new Vitamin D medication. OT recommended to pt to bring medication list with name/dosage to add to medication list. Pt verbalized understanding.  Pt accompanied by: self   PERTINENT HISTORY: s/p right total hip arthroplasty (05/21/23) to treat an impacted acute right hip femoral neck fracture after mechanical fall, chronic back pain, bronchiectasis, severe depression, anxiety, aortic atherosclerosis, iron deficiency, hyponatremia, leukocytosis  PRECAUTIONS: Fall  WEIGHT BEARING RESTRICTIONS: Yes    Per 05/23/23 PT Progress Notes: Post op he is WBAT on RLE with no hip precautions.   PAIN:  Are you having pain? Yes: NPRS scale: 4/10 Pain location: R lateral thigh Pain description: aching Aggravating factors: use Relieving factors: rest  FALLS: Has patient fallen in last 6 months? Yes. Number of falls 3 Pt reported foot slipping when trying to get dressed for 2-3 of the falls.  Per 06/03/23 PA-C D/C Summary: Pt "sustained a mechanical fall at home on 05/20/2023. Imaging  significant for displaced right femoral neck fracture. No other injuries." Per 06/03/23 MD Progress Notes: s/p right total hip arthroplasty (05/21/23) to treat an impacted acute right hip femoral neck fracture  LIVING ENVIRONMENT: Lives with: lives with their family Lives in: House/apartment Stairs: Yes: External: "a couple" steps; none (uses "post" as railing) Has following equipment at home: Walker - 4 wheeled, Grab bars, and  shower chair without back, walk-in shower and tub/shower  PLOF:  requires assistance with some BADLs/IADLs , does not drive  PATIENT GOALS: "to be as independent as possible."  OBJECTIVE:  Note: Objective measures were completed at Evaluation unless otherwise noted.  HAND DOMINANCE: Left  ADLs: Overall ADLs: requires some assistance Eating: ind Grooming: requires assistance for brushing teeth, shaving d/t "long-term depression" and "tremors" of BUE UB Dressing: ind, assistance required for buttons/zippers LB Dressing: requires assistance for pants/socks/shoes Toileting: requires assistance Bathing: requires assistance, currently sponge bathing Tub Shower transfers: not completing d/t pt sponge bathing Equipment: Shower seat without back, Grab bars, Walk in shower, and tub/shower  IADLs: Shopping: dependent Light housekeeping: dependent Meal Prep: dependent Community mobility: not currently driving Medication management: dependent Handwriting:  Pt reported difficulty with handwriting d/t resting hand tremors. Pt wrote x1 simple sentence with approx. 60% legibility. OT noted wavering line quality d/t tremors of BUE and decreased spacing between words.  MOBILITY STATUS: Independent, uses rollator walker for home and community mobility  POSTURE COMMENTS:  Ind seated posture  ACTIVITY TOLERANCE: Activity tolerance: more fatigued since hip surgery though pt reported "I had already been having trouble."  FUNCTIONAL OUTCOME MEASURES: PSFS : 2.0    UPPER EXTREMITY ROM:    Active ROM Right eval Left eval  Shoulder flexion 105* 115*  Shoulder abduction 118* 118*  Shoulder adduction    Shoulder extension    Shoulder internal rotation    Shoulder external rotation    Elbow flexion [facility] WFL  Elbow extension [facility] WFL  Wrist flexion [facility] WFL  Wrist extension [facility] WFL  Wrist ulnar deviation impaired impaired  Wrist radial deviation impaired impaired  Wrist pronation [facility] WFL   Wrist supination WFL WFL  (Blank rows = not tested)  LUE: Pointer finger on LUE  - PIP slight hyperext Enlarged MCP joints of digits 1-5 PIP joint hyperext severe for digit 3-5 Pt able to achieve neutral ext (0*) of PIP joints of LUE with gentle PROM. Pt c/o slight discomfort if flexing digits past neutral ext (0* ext).  RUE: Pinky finger on RUE - PIP slight hyperext Enlarged MCP joints of digits 1-5 PIP joint hyperext severe for digit 2-4 Pt able to achieve neutral ext (0*) of PIP joints of LUE with gentle PROM. Pt c/o slight discomfort if flexing digits past neutral ext (0* ext).  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 1.5 years ago. Pt reported hx of arthritis. Pt unable to make full composite fist secondary to PIP joint hyperext of BUE.   Pt able to oppose digit 5 to thumb of BUE.  Pt able to pinch with thumb and pointer finger and thumb and middle finger of BUE.   UPPER EXTREMITY MMT:     MMT Right eval Left eval  Shoulder flexion 4+ 4+  Shoulder abduction    Shoulder adduction    Shoulder extension    Shoulder internal rotation    Shoulder external rotation    Middle trapezius    Lower trapezius    Elbow flexion    Elbow extension    Wrist  flexion    Wrist extension    Wrist ulnar deviation    Wrist radial deviation    Wrist pronation    Wrist supination    (Blank rows = not tested)  HAND FUNCTION: Grip and pinch strength not tested d/t deformity of B PIP joints.  COORDINATION: 9 Hole Peg test: Right: 59 sec; Left: 53 sec  SENSATION: Pt reported mild tingling of B hands.  EDEMA: None noted.  MUSCLE TONE: RUE: Within functional limits and LUE: Within functional limits  COGNITION: Overall cognitive status: Within functional limits for tasks assessed  PERCEPTION: Not tested  PRAXIS: Not tested  OBSERVATIONS:  OT noted pt demo'd resting tremor of BUE, more pronounced of LUE.  OT noted hyperextension of BUE PIP joints with pt  reporting hx of "swan neck deformity." Pt demo'd increased difficulty with functional grip/pinch d/t BUE deformity though able to pick up pegs for 9-hole peg test indicating functional pinch.  Pt reported previously receiving hand therapy to address "swan neck deformity" at [name]. Pt reported also seeing rheumatologist. Pt reported there wasn't much progress as a result of therapy and pt reported previously trying splinting in the past without much success.  Pt ambulated with A/E.                                                                                                                            TREATMENT DATE:   [facility]Act After OT gathered subjective information, pt requested vomit bag. Pt reported "not eating well" earlier today. Pt reported previously experiencing similar symptoms of nausea after eating though typically just experiences "dry heaves." Pt denied feeling ill d/t potential viral/bacterial illness and reiterated that symptoms were likely d/t food choices before coming to therapy.   OT educated pt on option to reschedule therapy sessions today. However, pt requested to continue with therapy session.   TherEx OT initiated BUE strengthening exercises for potential addition to HEP. - to increase BUE strengthening, to improve general conditioning, to improve attention to upright seated posture and core stability - OT provided wrist weights for BUE exercises to decrease strain to B hands secondary to "swan neck deformity" of B hands.  Pt returned demonstration of the following exercises with min v/c for upright seated posture.  With 2 lb wrist weight at B wrists, BUE:  Elbow flex - 10 reps, 1 set Elbow ext - 10 reps, 1 set Shoulder flex - 10 reps, 1 set Shoulder ABD - 10 reps, 1 set  TherAct Pt reported increase in nausea symptoms after exercises listed above, therefore OT provided water to pt and educated pt on deep breathing strategies. Pt returned demonstration of deep  breathing.  OT additionally assessed BP and spO2. See pt vital signs. Pt reported BP was typical for pt. OT noted vital signs within therapeutic parameters and educated pt on vital signs and therapeutic parameters. Pt verbalized understanding. However, pt requested to end therapy early d/t nausea symptoms. OT updated  PT d/t pt's upcoming scheduled PT appointment.   PATIENT EDUCATION: Education details: see today's treatment above Person educated: Patient and Spouse Education method: Explanation Education comprehension: verbalized understanding  HOME EXERCISE PROGRAM: 06/17/23 - Practice LB dressing with yellow theraband loop and actual pants/shoes using reacher and step stool   GOALS: Goals reviewed with patient? Yes  SHORT TERM GOALS: Target date: 07/05/23  Pt will be ind with initial general strengthening/endurance HEP Baseline: new to outpt OT Goal status: INITIAL  2.  Pt will demo improved standing tolerance as evidenced by standing for at least 5 minutes while participating in functional task at tabletop with no more than supervision assist. Baseline: Pt reported decreased strength and endurance after R hip surgery. Goal status: INITIAL  3.  Pt will verbalize understanding of tremor reduction strategies to increase ind for handwriting and grooming ADL considerations. Baseline: new to outpt OT Goal status: INITIAL  4.  Pt will verbalize understanding of A/E options for bathing ADL tasks, including but not limited to transfer bench for tub shower, shower stool, long-handled sponge. Baseline: Pt currently participating in sponge-bathing with assistance to complete bathing ADL. Goal status: INITIAL  5.  Pt will verbalize understanding of A/E options for toileting ADL tasks, including but not limited to bidet, toilet aid, reacher for clothing management. Baseline: Assistance required Goal status: INITIAL  6.  Pt will verbalize understanding of A/E options to improve ability to  pick up items from floor.  Baseline: New to outpt OT. Pt reported "moderate difficulty" to pick up items from floor and reported PSFS individual score for picking up items from floor as 1 out of 10. Goal status: INITIAL  LONG TERM GOALS: Target date: 08/02/23  Pt will be ind with general strengthening/endurance HEP Baseline: new to outpt OT Goal status: INITIAL  2.  Pt will demo understanding of safe LB dressing strategies using adaptive strategies and A/E PRN, including completion of LB dressing while seated. Baseline: Pt reported currently requiring assistance for LB dressing tasks. Pt reported hx of previous falls d/t attempting to dress while standing.  Goal status: INITIAL  3. Patient will report at least two-point increase in average PSFS score or at least three-point increase in a single activity score indicating functionally significant improvement given minimum detectable change. Baseline: PSFS: 2.0 total score (See above for individual activity scores)  Goal status: INITIAL  ASSESSMENT:  CLINICAL IMPRESSION: Pt reported nausea symptoms today which limited participation in therapy. OT session ended early per pt request secondary to nausea symptoms. Pt would benefit from skilled OT services in the outpatient setting to work on impairments as noted below, to increase ind for ADL tasks, to improve standing tolerance/endurance, and to improve understanding of adaptive strategies and A/E for ADL/IADL tasks.   PERFORMANCE DEFICITS: in functional skills including ADLs, IADLs, coordination, dexterity, proprioception, sensation, ROM, strength, pain, flexibility, Fine motor control, Gross motor control, mobility, balance, body mechanics, endurance, decreased knowledge of use of DME, and UE functional use, cognitive skills including energy/drive, and psychosocial skills including environmental adaptation.   IMPAIRMENTS: are limiting patient from ADLs, IADLs, leisure, and social participation.    CO-MORBIDITIES: may have co-morbidities  that affects occupational performance. Patient will benefit from skilled OT to address above impairments and improve overall function.  MODIFICATION OR ASSISTANCE TO COMPLETE EVALUATION: Min-Moderate modification of tasks or assist with assess necessary to complete an evaluation.  OT OCCUPATIONAL PROFILE AND HISTORY: Problem focused assessment: Including review of records relating to presenting problem.  CLINICAL  DECISION MAKING: LOW - limited treatment options, no task modification necessary  REHAB POTENTIAL: Good  EVALUATION COMPLEXITY: Low    PLAN:  OT FREQUENCY: 1x/week  OT DURATION: 6 weeks (dates extended to allow for scheduling)  PLANNED INTERVENTIONS: 97168 OT Re-evaluation, 97535 self care/ADL training, 97110 therapeutic exercise, 97530 therapeutic activity, 97112 neuromuscular re-education, 97140 manual therapy, 97760 Orthotics management and training, 97760 Splinting (initial encounter), [phone] Subsequent splinting/medication, passive range of motion, functional mobility training, energy conservation, patient/family education, and DME and/or AE instructions  RECOMMENDED OTHER SERVICES: PT eval completed  CONSULTED AND AGREED WITH PLAN OF CARE: Patient and family member/caregiver  PLAN FOR NEXT SESSION:   Initiate general strength/endurance HEP (elbow flex/ext, shoulder flex/ext, additional exercises) : trunk/BUE - wrist weights or theraband loops around B wrists secondary to PIP hyperext of BUE digits  Educate on A/E options: transfer bench for tub shower, shower stool, long-handled sponge, bidet, toilet aid   Review LB dressing adaptive strategies to increase ind PRN - reacher and step stool   Wynetta Emery, OT 06/24/2023, 3:05 PM

## 2023-06-24 NOTE — Therapy (Incomplete)
OUTPATIENT PHYSICAL THERAPY LOWER EXTREMITY TREATMENT   Patient Name: Jeremy [name] MRN: [id number] DOB:10/22/1935, 88 y.o., male Today's Date: 06/24/2023  END OF SESSION:     Past Medical History:  Diagnosis Date   Aortic atherosclerosis (HCC)    BPH (benign prostatic hyperplasia)    Bronchiectasis (HCC)    Depression    Depression    GAD (generalized anxiety disorder)    Past Surgical History:  Procedure Laterality Date   APPENDECTOMY     HERNIA REPAIR     TOTAL HIP ARTHROPLASTY Right 05/21/2023   Procedure: RIGHT TOTAL HIP ARTHROPLASTY;  Surgeon: Kathryne Hitch, MD;  Location: [facility] OR;  Service: Orthopedics;  Laterality: Right;   Patient Active Problem List   Diagnosis Date Noted   Malnutrition of moderate degree 05/23/2023   Closed right hip fracture (HCC) 05/23/2023   Closed subcapital fracture of right femur (HCC) 05/21/2023   Closed right hip fracture, initial encounter (HCC) 05/20/2023   Normocytic anemia 05/20/2023   Bronchiectasis (HCC) 05/20/2023   First degree AV block 05/20/2023   Anxiety and depression 05/20/2023   BPH (benign prostatic hyperplasia) 05/20/2023   Severe depression (HCC) 06/09/2021   Generalized anxiety disorder 06/09/2021   History of psychosis 06/09/2021   Insomnia 09/11/2020    PCP: Irena Reichmann, DO  REFERRING PROVIDER: Charlton Amor, PA-C  REFERRING DIAG: S72.001A (ICD-10-CM) - Fracture of unspecified part of neck of right femur, initial encounter for closed fracture  THERAPY DIAG:  No diagnosis found.  Rationale for Evaluation and Treatment: Rehabilitation  ONSET DATE: 05/30/2023 (referral date)  SUBJECTIVE:   SUBJECTIVE STATEMENT: ***  Wife: [name] (in lobby)   PERTINENT HISTORY: PMH: BPH, bronchiectasis, severe depression, anxiety and aortic atherosclerosis  PAIN:  Are you having pain? Yes: NPRS scale: 4/10 Pain location: R mid-thigh to upper anterior hip Pain description: soreness Aggravating  factors: certain movements Relieving factors: resting  PRECAUTIONS: Fall  RED FLAGS: None   WEIGHT BEARING RESTRICTIONS: Yes WBAT RLE  FALLS:  Has patient fallen in last 6 months? Yes. Number of falls 3 in the past 6 months, feel like legs slip out from under him; usually needs help to get back up  LIVING ENVIRONMENT: Lives with: lives with their spouse Lives in: House/apartment Stairs: Yes: External: 3-4 steps; has a post to hold onto Has following equipment at home: Dan Humphreys - 4 wheeled, shower chair, and Grab bars  OCCUPATION: retired  PLOF: Independent with gait, Independent with transfers, and Requires assistive device for independence  PATIENT GOALS: "walk independently"  NEXT MD VISIT: sees hip surgeon in 4 weeks (06/30/22)  OBJECTIVE:  Note: Objective measures were completed at Evaluation unless otherwise noted.  DIAGNOSTIC FINDINGS:  R hip xray prior to surgery 05/20/23 FINDINGS: SI joints are non widened. Pubic symphysis and rami appear intact. Acute right femoral neck fracture. No femoral head dislocation   IMPRESSION: Acute right femoral neck fracture.  COGNITION: Overall cognitive status: Difficulty to assess due to: flat affect       POSTURE: rounded shoulders, forward head, and posterior pelvic tilt   LOWER EXTREMITY ROM:  Active ROM Right eval Left eval  Hip flexion Decreased due to pain   Hip extension    Hip abduction    Hip adduction    Hip internal rotation    Hip external rotation    Knee flexion    Knee extension    Ankle dorsiflexion    Ankle plantarflexion    Ankle inversion    Ankle  eversion     (Blank rows = not tested)  LOWER EXTREMITY MMT:  MMT Right eval Left eval  Hip flexion 4 5  Hip extension    Hip abduction    Hip adduction    Hip internal rotation    Hip external rotation    Knee flexion 4 5  Knee extension 4 5  Ankle dorsiflexion 5 5  Ankle plantarflexion    Ankle inversion    Ankle eversion     (Blank  rows = not tested)                                                                                                                                 TREATMENT: LUE in sitting prior to interventions: There were no vitals filed for this visit. Time allotted for pt to drink water, reporting he would like to start with the bike as he thinks it will make him feel better.  Ther Ex  SciFit multi-peaks level 5.0 for 8 minutes using BUE/BLEs for neural priming for reciprocal movement, dynamic cardiovascular warmup and improved ROM of RLE (large amplitude).  RPE of 5-6/10 following activity. Deadlift into progressively lowered height 18.5" > 14.5" > 8" w/ facilitation of hip hinge and reduced kyphotic posture x8 each height (pt has increased difficulty reaching lowest height without some upper back rounding)***  NMR: ***  Gait pattern: step through pattern, decreased stride length, decreased ankle dorsiflexion- Right, decreased ankle dorsiflexion- Left, scissoring, lateral hip instability, narrow BOS, poor foot clearance- Right, and poor foot clearance- Left Distance walked: Various clinic distances  Assistive device utilized: Walker - 4 wheeled Level of assistance: Modified independence Comments: Noted scissoring of RLE and decreased eccentric control w/IC and FF on LLE. No LOB noted    PATIENT EDUCATION:  Education details: Continue HEP.  Rollator safety especially when approaching sitting - brake management.*** Person educated: Patient and Spouse Education method: Explanation and Demonstration Education comprehension: verbalized understanding, returned demonstration, and needs further education  HOME EXERCISE PROGRAM: Access Code: [email or web address] URL: [email or web address] Date: 06/13/2023 Prepared by: Alethia Berthold Plaster  Exercises - Side Stepping with Resistance at Thighs and Counter Support  - 1 x daily - 7 x weekly - 3 sets - 10 reps - Forward Backward Monster Walk with Band at  [employer] and Counter Support  - 1 x daily - 7 x weekly - 3 sets - 10 reps - Bird Dog  - 1 x daily - 7 x weekly - 2 sets - 10 reps - Squat with Chair Touch  - 1 x daily - 7 x weekly - 2 sets - 10 reps  ASSESSMENT:  CLINICAL IMPRESSION: Emphasis of skilled PT session*** Continue POC.    OBJECTIVE IMPAIRMENTS: Abnormal gait, decreased balance, decreased knowledge of use of DME, decreased mobility, decreased ROM, decreased strength, impaired UE functional use, postural dysfunction, and pain.   ACTIVITY LIMITATIONS: carrying, lifting, bending, standing, stairs, transfers, and bed mobility  PARTICIPATION LIMITATIONS: meal prep, cleaning, driving, and community activity  PERSONAL FACTORS: Age and 1-2 comorbidities:    BPH, bronchiectasis, severe depression, anxiety and aortic atherosclerosisare also affecting patient's functional outcome.   REHAB POTENTIAL: Good  CLINICAL DECISION MAKING: Stable/uncomplicated  EVALUATION COMPLEXITY: Low   GOALS: Goals reviewed with patient? Yes  SHORT TERM GOALS: Target date: 06/28/2023   Pt will be independent with initial HEP for improved strength, balance, transfers and gait. Baseline: Goal status: INITIAL  2.  Pt will improve 5 x STS to less than or equal to 22 seconds to demonstrate improved functional strength and transfer efficiency.  Baseline: 26.18 sec no UE (1/3) Goal status: INITIAL  3.  Pt will improve gait velocity to at least 3.25 ft/sec for improved gait efficiency and performance at mod I level  Baseline: 2.92 ft/sec with rollator (1/3) Goal status: INITIAL  4.  Pt will improve normal TUG to less than or equal to 19 seconds for improved functional mobility and decreased fall risk. Baseline: 22.62 sec with rollator (1/3) Goal status: INITIAL  5.  Pt will improve Berg score to 37/56 for decreased fall risk Baseline: 33/56 (1/3) Goal status: INITIAL   LONG TERM GOALS: Target date: 07/19/2023    Pt will be independent with  final HEP for improved strength, balance, transfers and gait. Baseline:  Goal status: INITIAL  2.  Pt will improve 5 x STS to less than or equal to 18 seconds to demonstrate improved functional strength and transfer efficiency.  Baseline: 26.18 sec no UE (1/3) Goal status: INITIAL  3.  Pt will improve gait velocity to at least 3.5 ft/sec for improved gait efficiency and performance at mod I level  Baseline: 2.92 ft/sec with rollator (1/3) Goal status: INITIAL  4.  Pt will improve normal TUG to less than or equal to 16 seconds for improved functional mobility and decreased fall risk. Baseline: 22.62 sec with rollator (1/3) Goal status: INITIAL  5.  Pt will improve Berg score to 41/56 for decreased fall risk Baseline: 33/56 (1/3) Goal status: INITIAL     PLAN:  PT FREQUENCY: 2x/week  PT DURATION: 6 weeks  PLANNED INTERVENTIONS: 40981- PT Re-evaluation, 97110-Therapeutic exercises, 97530- Therapeutic activity, 97112- Neuromuscular re-education, 97535- Self Care, 19147- Manual therapy, [phone]- Gait training, Patient/Family education, Balance training, Stair training, Taping, Dry Needling, Scar mobilization, DME instructions, Cryotherapy, and Moist heat  PLAN FOR NEXT SESSION: pt goal is to get away from rollator; add to HEP to work on functional strengthening and balance (sit to stands, balance with decreased UE support), lifting and functional strength , progress deadlift height, standing to taps, tilt board, forward bend to cone tap, step out slam balls, leg press?  Foam beam - ball toss, tandem, heel raise; reactive balance - perturbations in // bars, resisted STS > slam ball***   Peter Congo, PT Peter Congo, PT, DPT, CSRS   06/24/2023, 8:36 AM

## 2023-06-26 DIAGNOSIS — N401 Enlarged prostate with lower urinary tract symptoms: Secondary | ICD-10-CM | POA: Diagnosis not present

## 2023-06-26 DIAGNOSIS — R3912 Poor urinary stream: Secondary | ICD-10-CM | POA: Diagnosis not present

## 2023-06-27 ENCOUNTER — Ambulatory Visit: Payer: Medicare Other | Admitting: Physical Therapy

## 2023-06-27 VITALS — BP 127/65 | HR 74

## 2023-06-27 DIAGNOSIS — R2689 Other abnormalities of gait and mobility: Secondary | ICD-10-CM

## 2023-06-27 DIAGNOSIS — M6281 Muscle weakness (generalized): Secondary | ICD-10-CM | POA: Diagnosis not present

## 2023-06-27 DIAGNOSIS — M25551 Pain in right hip: Secondary | ICD-10-CM | POA: Diagnosis not present

## 2023-06-27 DIAGNOSIS — R2681 Unsteadiness on feet: Secondary | ICD-10-CM | POA: Diagnosis not present

## 2023-06-27 NOTE — Therapy (Signed)
OUTPATIENT PHYSICAL THERAPY LOWER EXTREMITY TREATMENT   Patient Name: Jeremy [name] MRN: [id number] DOB:[birth year], 88 y.o., male Today's Date: 06/27/2023  END OF SESSION:  PT End of Session - 06/27/23 1535     Visit Number 5    Number of Visits 13   with eval   Date for PT Re-Evaluation 08/02/23    Authorization Type Medicare    Progress Note Due on Visit 10    PT Start Time 1535   pt arrived late   PT Stop Time 1614    PT Time Calculation (min) 39 min    Equipment Utilized During Treatment Gait belt    Activity Tolerance Patient tolerated treatment well    Behavior During Therapy Flat affect               Past Medical History:  Diagnosis Date   Aortic atherosclerosis (HCC)    BPH (benign prostatic hyperplasia)    Bronchiectasis (HCC)    Depression    Depression    GAD (generalized anxiety disorder)    Past Surgical History:  Procedure Laterality Date   APPENDECTOMY     HERNIA REPAIR     TOTAL HIP ARTHROPLASTY Right 05/21/2023   Procedure: RIGHT TOTAL HIP ARTHROPLASTY;  Surgeon: Kathryne Hitch, MD;  Location: [facility] OR;  Service: Orthopedics;  Laterality: Right;   Patient Active Problem List   Diagnosis Date Noted   Malnutrition of moderate degree 05/23/2023   Closed right hip fracture (HCC) 05/23/2023   Closed subcapital fracture of right femur (HCC) 05/21/2023   Closed right hip fracture, initial encounter (HCC) 05/20/2023   Normocytic anemia 05/20/2023   Bronchiectasis (HCC) 05/20/2023   First degree AV block 05/20/2023   Anxiety and depression 05/20/2023   BPH (benign prostatic hyperplasia) 05/20/2023   Severe depression (HCC) 06/09/2021   Generalized anxiety disorder 06/09/2021   History of psychosis 06/09/2021   Insomnia 09/11/2020    PCP: Irena Reichmann, DO  REFERRING PROVIDER: Charlton Amor, PA-C  REFERRING DIAG: S72.001A (ICD-10-CM) - Fracture of unspecified part of neck of right femur, initial encounter for closed  fracture  THERAPY DIAG:  Muscle weakness (generalized)  Unsteadiness on feet  Other abnormalities of gait and mobility  Rationale for Evaluation and Treatment: Rehabilitation  ONSET DATE: 05/30/2023 (referral date)  SUBJECTIVE:   SUBJECTIVE STATEMENT: Pt denies any falls or acute changes since last visit, no real pain today.  Wife: [name] (in lobby)   PERTINENT HISTORY: PMH: BPH, bronchiectasis, severe depression, anxiety and aortic atherosclerosis  PAIN:  Are you having pain? No  PRECAUTIONS: Fall  RED FLAGS: None   WEIGHT BEARING RESTRICTIONS: Yes WBAT RLE  FALLS:  Has patient fallen in last 6 months? Yes. Number of falls 3 in the past 6 months, feel like legs slip out from under him; usually needs help to get back up  LIVING ENVIRONMENT: Lives with: lives with their spouse Lives in: House/apartment Stairs: Yes: External: 3-4 steps; has a post to hold onto Has following equipment at home: Dan Humphreys - 4 wheeled, shower chair, and Grab bars  OCCUPATION: retired  PLOF: Independent with gait, Independent with transfers, and Requires assistive device for independence  PATIENT GOALS: "walk independently"  NEXT MD VISIT: sees hip surgeon in 4 weeks (06/30/22)  OBJECTIVE:  Note: Objective measures were completed at Evaluation unless otherwise noted.  DIAGNOSTIC FINDINGS:  R hip xray prior to surgery 05/20/23 FINDINGS: SI joints are non widened. Pubic symphysis and rami appear intact. Acute right femoral neck  fracture. No femoral head dislocation   IMPRESSION: Acute right femoral neck fracture.  COGNITION: Overall cognitive status: Difficulty to assess due to: flat affect       POSTURE: rounded shoulders, forward head, and posterior pelvic tilt   LOWER EXTREMITY ROM:  Active ROM Right eval Left eval  Hip flexion Decreased due to pain   Hip extension    Hip abduction    Hip adduction    Hip internal rotation    Hip external rotation    Knee flexion     Knee extension    Ankle dorsiflexion    Ankle plantarflexion    Ankle inversion    Ankle eversion     (Blank rows = not tested)  LOWER EXTREMITY MMT:  MMT Right eval Left eval  Hip flexion 4 5  Hip extension    Hip abduction    Hip adduction    Hip internal rotation    Hip external rotation    Knee flexion 4 5  Knee extension 4 5  Ankle dorsiflexion 5 5  Ankle plantarflexion    Ankle inversion    Ankle eversion     (Blank rows = not tested)                                                                                                                                 TREATMENT: LUE in sitting prior to interventions: Vitals:   06/27/23 1537  BP: 127/65  Pulse: 74    Ther Ex  NuStep level 4 x 8 min with use of B UE/LE for neural priming for reciprocal movement, dynamic cardiovascular warmup, and improved ROM of RLE. RPE 4/10 following activity. Weighted sit to stand with 6# slam ball followed by ball slam to the ground, 2 x 10 reps Progression from bracing against mat table with sit to stand to not bracing   TherAct In // bars to work on dynamic standing balance, hip strengthening, and SLS stability: Foam beam sidesteps with BUE 2 x 10 ft R/L Foam beam sidesteps without BUE 2 x 10 ft R/L Added in unweighted ball punch-outs 2 x 10 ft R/L Added in alt L/R gumdrop taps 3 x 10 ft R/L    PATIENT EDUCATION:  Education details: Continue HEP.  Rollator safety especially when approaching sitting - brake management. Person educated: Patient Education method: Medical illustrator Education comprehension: verbalized understanding, returned demonstration, and needs further education  HOME EXERCISE PROGRAM: Access Code: [email or web address] URL: [email or web address] Date: 06/13/2023 Prepared by: Alethia Berthold Plaster  Exercises - Side Stepping with Resistance at Thighs and Counter Support  - 1 x daily - 7 x weekly - 3 sets - 10 reps - Forward Backward Monster  Walk with Band at [employer] and Counter Support  - 1 x daily - 7 x weekly - 3 sets - 10 reps - Bird Dog  - 1 x daily - 7 x weekly - 2 sets - 10  reps - Squat with Chair Touch  - 1 x daily - 7 x weekly - 2 sets - 10 reps  ASSESSMENT:  CLINICAL IMPRESSION: Emphasis of skilled PT session on continuing to work on functional strengthening and dynamic balance in order to decrease fall risk. Pt with improved tolerance for therapy this date with decreased rest breaks needed, does need ongoing education regarding safe management of rollator brakes and bringing rollator all the way back with him when sitting. Pt continues to benefit from skilled PT services to work on improving his strength, endurance, and balance and decreasing his risk for falls. Continue POC.    OBJECTIVE IMPAIRMENTS: Abnormal gait, decreased balance, decreased knowledge of use of DME, decreased mobility, decreased ROM, decreased strength, impaired UE functional use, postural dysfunction, and pain.   ACTIVITY LIMITATIONS: carrying, lifting, bending, standing, stairs, transfers, and bed mobility  PARTICIPATION LIMITATIONS: meal prep, cleaning, driving, and community activity  PERSONAL FACTORS: Age and 1-2 comorbidities:    BPH, bronchiectasis, severe depression, anxiety and aortic atherosclerosisare also affecting patient's functional outcome.   REHAB POTENTIAL: Good  CLINICAL DECISION MAKING: Stable/uncomplicated  EVALUATION COMPLEXITY: Low   GOALS: Goals reviewed with patient? Yes  SHORT TERM GOALS: Target date: 06/28/2023   Pt will be independent with initial HEP for improved strength, balance, transfers and gait. Baseline: Goal status: INITIAL  2.  Pt will improve 5 x STS to less than or equal to 22 seconds to demonstrate improved functional strength and transfer efficiency.  Baseline: 26.18 sec no UE (1/3) Goal status: INITIAL  3.  Pt will improve gait velocity to at least 3.25 ft/sec for improved gait efficiency and  performance at mod I level  Baseline: 2.92 ft/sec with rollator (1/3) Goal status: INITIAL  4.  Pt will improve normal TUG to less than or equal to 19 seconds for improved functional mobility and decreased fall risk. Baseline: 22.62 sec with rollator (1/3) Goal status: INITIAL  5.  Pt will improve Berg score to 37/56 for decreased fall risk Baseline: 33/56 (1/3) Goal status: INITIAL   LONG TERM GOALS: Target date: 07/19/2023    Pt will be independent with final HEP for improved strength, balance, transfers and gait. Baseline:  Goal status: INITIAL  2.  Pt will improve 5 x STS to less than or equal to 18 seconds to demonstrate improved functional strength and transfer efficiency.  Baseline: 26.18 sec no UE (1/3) Goal status: INITIAL  3.  Pt will improve gait velocity to at least 3.5 ft/sec for improved gait efficiency and performance at mod I level  Baseline: 2.92 ft/sec with rollator (1/3) Goal status: INITIAL  4.  Pt will improve normal TUG to less than or equal to 16 seconds for improved functional mobility and decreased fall risk. Baseline: 22.62 sec with rollator (1/3) Goal status: INITIAL  5.  Pt will improve Berg score to 41/56 for decreased fall risk Baseline: 33/56 (1/3) Goal status: INITIAL     PLAN:  PT FREQUENCY: 2x/week  PT DURATION: 6 weeks  PLANNED INTERVENTIONS: 97164- PT Re-evaluation, 97110-Therapeutic exercises, 97530- Therapeutic activity, 97112- Neuromuscular re-education, 97535- Self Care, 21308- Manual therapy, [phone]- Gait training, Patient/Family education, Balance training, Stair training, Taping, Dry Needling, Scar mobilization, DME instructions, Cryotherapy, and Moist heat  PLAN FOR NEXT SESSION: assess STG, pt goal is to get away from rollator; add to HEP to work on functional strengthening and balance (sit to stands, balance with decreased UE support), lifting and functional strength , progress deadlift height, standing to  taps, tilt board,  forward bend to cone tap, step out slam balls, leg press?  Foam beam - ball toss, tandem, heel raise; reactive balance - perturbations in // bars, resisted STS > slam ball   Peter Congo, PT Peter Congo, PT, DPT, CSRS   06/27/2023, 4:14 PM

## 2023-06-30 ENCOUNTER — Other Ambulatory Visit: Payer: Self-pay | Admitting: Physician Assistant

## 2023-07-01 ENCOUNTER — Encounter: Payer: Self-pay | Admitting: Orthopaedic Surgery

## 2023-07-01 ENCOUNTER — Ambulatory Visit (INDEPENDENT_AMBULATORY_CARE_PROVIDER_SITE_OTHER): Payer: Medicare Other | Admitting: Orthopaedic Surgery

## 2023-07-01 ENCOUNTER — Other Ambulatory Visit (INDEPENDENT_AMBULATORY_CARE_PROVIDER_SITE_OTHER): Payer: Medicare Other

## 2023-07-01 ENCOUNTER — Other Ambulatory Visit: Payer: Self-pay | Admitting: Physician Assistant

## 2023-07-01 DIAGNOSIS — Z96641 Presence of right artificial hip joint: Secondary | ICD-10-CM

## 2023-07-01 NOTE — Progress Notes (Signed)
The patient is an 88 year old gentleman who is now 6 weeks out from a right total hip arthroplasty to treat an acute right hip femoral neck fracture.  He does ambulate with a rolling walker.  I believe he has Parkinson disease and he is still going through therapy including OT.  He reports that the hip is coming along and he has no issues with it.  He lets me put his right hip through internal and external rotation with just a little bit of discomfort.  An AP pelvis shows a well-seated right total hip arthroplasty with no complicating features.  He understands that there are no restrictions as a relates to his right hip.  He should slowly increase his activities as he feels comfortable but continue his walker given his Parkinson's and knees.  We will see him back in 6 months with a repeat standing AP pelvis.  We can always see him sooner if there are issues.

## 2023-07-02 ENCOUNTER — Ambulatory Visit: Payer: Medicare Other | Admitting: Physical Therapy

## 2023-07-02 ENCOUNTER — Encounter: Payer: Self-pay | Admitting: Physical Therapy

## 2023-07-02 ENCOUNTER — Ambulatory Visit: Payer: Medicare Other | Admitting: Occupational Therapy

## 2023-07-02 DIAGNOSIS — M25551 Pain in right hip: Secondary | ICD-10-CM | POA: Diagnosis not present

## 2023-07-02 DIAGNOSIS — R2681 Unsteadiness on feet: Secondary | ICD-10-CM | POA: Diagnosis not present

## 2023-07-02 DIAGNOSIS — M6281 Muscle weakness (generalized): Secondary | ICD-10-CM

## 2023-07-02 DIAGNOSIS — R2689 Other abnormalities of gait and mobility: Secondary | ICD-10-CM | POA: Diagnosis not present

## 2023-07-02 NOTE — Patient Instructions (Signed)
Home exercises with red theraband  To  Loop Theraband around wrists: Wrap 1 therband loop around each wrist. Put all 5 fingers in loop with one hand then use opposite thumb to "hook" therband to the webspace. (Avoid strain to fingers.) For each exercise: 10 reps, 2 sets, 1x per day Complete each exercise with Right hand: Elbow Flexion: Left hand resting in lap, keep right elbow tucked in, straighten and bend right elbow Elbow Extension: Left hand at heart, keep right elbow tucked in, straighten and bend right elbow Shoulder Flexion: Left hand resting in lap, keep right elbow straight, right arm straight out in front of you, move arm up and down at shoulder (keep thumb facing towards the sky)  Repeat #1-3 with Left arm completing the movements   Shoulder Abduction: Both arms straight out in front of you at shoulder level, keep both elbows straight, open and close arms.

## 2023-07-02 NOTE — Therapy (Addendum)
OUTPATIENT OCCUPATIONAL THERAPY NEURO Treatment  Patient Name: Jeremy [name] MRN: [id number] DOB:1935/07/15, 88 y.o., male Today's Date: 07/02/2023  PCP: Irena Reichmann, DO  REFERRING PROVIDER: Charlton Amor, PA-C   END OF SESSION:  OT End of Session - 07/02/23 1505     Visit Number 4    Number of Visits 7   including eval   Date for OT Re-Evaluation 08/02/23    Authorization Type [id number]    OT Start Time 1319    OT Stop Time 1400    OT Time Calculation (min) 41 min    Activity Tolerance Patient tolerated treatment well    Behavior During Therapy Flat affect                Past Medical History:  Diagnosis Date   Aortic atherosclerosis (HCC)    BPH (benign prostatic hyperplasia)    Bronchiectasis (HCC)    Depression    Depression    GAD (generalized anxiety disorder)    Past Surgical History:  Procedure Laterality Date   APPENDECTOMY     HERNIA REPAIR     TOTAL HIP ARTHROPLASTY Right 05/21/2023   Procedure: RIGHT TOTAL HIP ARTHROPLASTY;  Surgeon: Kathryne Hitch, MD;  Location: [facility] OR;  Service: Orthopedics;  Laterality: Right;   Patient Active Problem List   Diagnosis Date Noted   Malnutrition of moderate degree 05/23/2023   Closed right hip fracture (HCC) 05/23/2023   Closed subcapital fracture of right femur (HCC) 05/21/2023   Closed right hip fracture, initial encounter (HCC) 05/20/2023   Normocytic anemia 05/20/2023   Bronchiectasis (HCC) 05/20/2023   First degree AV block 05/20/2023   Anxiety and depression 05/20/2023   BPH (benign prostatic hyperplasia) 05/20/2023   Severe depression (HCC) 06/09/2021   Generalized anxiety disorder 06/09/2021   History of psychosis 06/09/2021   Insomnia 09/11/2020    ONSET DATE: 05/30/2023  (referral date), 05/21/23 (date of R total hip arthroplasty)  REFERRING DIAG: S72.001A (ICD-10-CM) - Fracture of unspecified part of neck of right femur, initial encounter for closed fracture   THERAPY DIAG:   Muscle weakness (generalized)  Unsteadiness on feet  Pain in right hip  Rationale for Evaluation and Treatment: Rehabilitation  SUBJECTIVE:   SUBJECTIVE STATEMENT: Pt reported feeling better today.  Pt reported day-to-day activities "are going okay." Pt reported noticing pain in hip more today.  Pt accompanied by: self   PERTINENT HISTORY: s/p right total hip arthroplasty (05/21/23) to treat an impacted acute right hip femoral neck fracture after mechanical fall, chronic back pain, bronchiectasis, severe depression, anxiety, aortic atherosclerosis, iron deficiency, hyponatremia, leukocytosis  PRECAUTIONS: Fall  WEIGHT BEARING RESTRICTIONS: Yes    Per 05/23/23 PT Progress Notes: Post op he is WBAT on RLE with no hip precautions.   PAIN:  Are you having pain? Yes: NPRS scale: 4/10 Pain location: R hip Pain description: sore Aggravating factors: activity Relieving factors: rest  FALLS: Has patient fallen in last 6 months? Yes. Number of falls 3 Pt reported foot slipping when trying to get dressed for 2-3 of the falls.  Per 06/03/23 PA-C D/C Summary: Pt "sustained a mechanical fall at home on 05/20/2023. Imaging significant for displaced right femoral neck fracture. No other injuries." Per 06/03/23 MD Progress Notes: s/p right total hip arthroplasty (05/21/23) to treat an impacted acute right hip femoral neck fracture  LIVING ENVIRONMENT: Lives with: lives with their family Lives in: House/apartment Stairs: Yes: External: "a couple" steps; none (uses "post" as railing) Has following  equipment at home: Dan Humphreys - 4 wheeled, Grab bars, and shower chair without back, walk-in shower and tub/shower  PLOF:  requires assistance with some BADLs/IADLs , does not drive  PATIENT GOALS: "to be as independent as possible."  OBJECTIVE:  Note: Objective measures were completed at Evaluation unless otherwise noted.  HAND DOMINANCE: Left  ADLs: Overall ADLs: requires some  assistance Eating: ind Grooming: requires assistance for brushing teeth, shaving d/t "long-term depression" and "tremors" of BUE UB Dressing: ind, assistance required for buttons/zippers LB Dressing: requires assistance for pants/socks/shoes Toileting: requires assistance Bathing: requires assistance, currently sponge bathing Tub Shower transfers: not completing d/t pt sponge bathing Equipment: Shower seat without back, Grab bars, Walk in shower, and tub/shower  IADLs: Shopping: dependent Light housekeeping: dependent Meal Prep: dependent Community mobility: not currently driving Medication management: dependent Handwriting:  Pt reported difficulty with handwriting d/t resting hand tremors. Pt wrote x1 simple sentence with approx. 60% legibility. OT noted wavering line quality d/t tremors of BUE and decreased spacing between words.  MOBILITY STATUS: Independent, uses rollator walker for home and community mobility  POSTURE COMMENTS:  Ind seated posture  ACTIVITY TOLERANCE: Activity tolerance: more fatigued since hip surgery though pt reported "I had already been having trouble."  FUNCTIONAL OUTCOME MEASURES: PSFS : 2.0    UPPER EXTREMITY ROM:    Active ROM Right eval Left eval  Shoulder flexion 105* 115*  Shoulder abduction 118* 118*  Shoulder adduction    Shoulder extension    Shoulder internal rotation    Shoulder external rotation    Elbow flexion [facility] WFL  Elbow extension [facility] WFL  Wrist flexion [facility] WFL  Wrist extension [facility] WFL  Wrist ulnar deviation impaired impaired  Wrist radial deviation impaired impaired  Wrist pronation [facility] WFL  Wrist supination WFL WFL  (Blank rows = not tested)  LUE: Pointer finger on LUE  - PIP slight hyperext Enlarged MCP joints of digits 1-5 PIP joint hyperext severe for digit 3-5 Pt able to achieve neutral ext (0*) of PIP joints of LUE with gentle PROM. Pt c/o slight discomfort if flexing digits past neutral ext (0*  ext).  RUE: Pinky finger on RUE - PIP slight hyperext Enlarged MCP joints of digits 1-5 PIP joint hyperext severe for digit 2-4 Pt able to achieve neutral ext (0*) of PIP joints of LUE with gentle PROM. Pt c/o slight discomfort if flexing digits past neutral ext (0* ext).  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 1.5 years ago. Pt reported hx of arthritis. Pt unable to make full composite fist secondary to PIP joint hyperext of BUE.   Pt able to oppose digit 5 to thumb of BUE.  Pt able to pinch with thumb and pointer finger and thumb and middle finger of BUE.   UPPER EXTREMITY MMT:     MMT Right eval Left eval  Shoulder flexion 4+ 4+  Shoulder abduction    Shoulder adduction    Shoulder extension    Shoulder internal rotation    Shoulder external rotation    Middle trapezius    Lower trapezius    Elbow flexion    Elbow extension    Wrist flexion    Wrist extension    Wrist ulnar deviation    Wrist radial deviation    Wrist pronation    Wrist supination    (Blank rows = not tested)  HAND FUNCTION: Grip and pinch strength not tested d/t deformity of B PIP joints.  COORDINATION: 9 Hole Peg  test: Right: 59 sec; Left: 53 sec  SENSATION: Pt reported mild tingling of B hands.  EDEMA: None noted.  MUSCLE TONE: RUE: Within functional limits and LUE: Within functional limits  COGNITION: Overall cognitive status: Within functional limits for tasks assessed  PERCEPTION: Not tested  PRAXIS: Not tested  OBSERVATIONS:  OT noted pt demo'd resting tremor of BUE, more pronounced of LUE.  OT noted hyperextension of BUE PIP joints with pt reporting hx of "swan neck deformity." Pt demo'd increased difficulty with functional grip/pinch d/t BUE deformity though able to pick up pegs for 9-hole peg test indicating functional pinch.  Pt reported previously receiving hand therapy to address "swan neck deformity" at [name]. Pt reported also seeing rheumatologist. Pt  reported there wasn't much progress as a result of therapy and pt reported previously trying splinting in the past without much success.  Pt ambulated with A/E.                                                                                                                            TREATMENT DATE:   [facility] OT initiated BUE strengthening HEP - to increase BUE strengthening, to improve general conditioning, to increase core/trunk strength and stability, to improve attention to upright seated posture. - OT provided pt with red theraband with loops at both ends to decrease strain and improve joint protection of B hands secondary to "swan neck deformity" of B hands. OT educated pt on donning/doffing theraband to avoid strain to B digits. Pt returned demonstration with mod to max v/c and therapist modeling.  Pt returned demonstration of red theraband HEP (3 sets, 10 reps each exercise) with mod to max v/c for upright seated posture and BUE positioning. See pt instructions for specific exercises.  Pt demo'd increased difficulty with cognitive components of tasks today and with sequencing unfamiliar motor movements. Therefore, pt benefited from mod to max v/c, tactile cues, therapist modeling, repeated verbal instructions, and extra time to improve understanding of tasks.   To improve carryover of HEP, OT also educated pt's spouse on HEP and strategies to don/doff theraband loops for joint protection. Pt's spouse verbalized understanding and returned demonstration.    PATIENT EDUCATION: Education details: see today's treatment above, strategies to reduce fall risk Person educated: Patient and Spouse Education method: Explanation, Demonstration, Tactile cues, Verbal cues, and Handouts Education comprehension: verbalized understanding, returned demonstration, and needs further education  HOME EXERCISE PROGRAM: 06/17/23 - Practice LB dressing with yellow theraband loop and actual pants/shoes using  reacher and step stool 07/02/23 - red theraband, see pt instructions.   GOALS: Goals reviewed with patient? Yes  SHORT TERM GOALS: Target date: 07/05/23  Pt will be ind with initial general strengthening/endurance HEP Baseline: new to outpt OT Goal status: INITIAL  2.  Pt will demo improved standing tolerance as evidenced by standing for at least 5 minutes while participating in functional task at tabletop with no more than supervision assist. Baseline: Pt  reported decreased strength and endurance after R hip surgery. Goal status: INITIAL  3.  Pt will verbalize understanding of tremor reduction strategies to increase ind for handwriting and grooming ADL considerations. Baseline: new to outpt OT Goal status: INITIAL  4.  Pt will verbalize understanding of A/E options for bathing ADL tasks, including but not limited to transfer bench for tub shower, shower stool, long-handled sponge. Baseline: Pt currently participating in sponge-bathing with assistance to complete bathing ADL. Goal status: INITIAL  5.  Pt will verbalize understanding of A/E options for toileting ADL tasks, including but not limited to bidet, toilet aid, reacher for clothing management. Baseline: Assistance required Goal status: INITIAL  6.  Pt will verbalize understanding of A/E options to improve ability to pick up items from floor.  Baseline: New to outpt OT. Pt reported "moderate difficulty" to pick up items from floor and reported PSFS individual score for picking up items from floor as 1 out of 10. Goal status: INITIAL  LONG TERM GOALS: Target date: 08/02/23  Pt will be ind with general strengthening/endurance HEP Baseline: new to outpt OT Goal status: INITIAL  2.  Pt will demo understanding of safe LB dressing strategies using adaptive strategies and A/E PRN, including completion of LB dressing while seated. Baseline: Pt reported currently requiring assistance for LB dressing tasks. Pt reported hx of  previous falls d/t attempting to dress while standing.  Goal status: INITIAL  3. Patient will report at least two-point increase in average PSFS score or at least three-point increase in a single activity score indicating functionally significant improvement given minimum detectable change. Baseline: PSFS: 2.0 total score (See above for individual activity scores)  Goal status: INITIAL  ASSESSMENT:  CLINICAL IMPRESSION: Pt demo'd increased difficulty with cognitive components of tasks today and with sequencing unfamiliar motor movements. Therefore, pt benefited from mod to max v/c, tactile cues, therapist modeling, repeated verbal instructions, and extra time to improve understanding of tasks. Overall, pt tolerated updated HEP fairly well today. Pt would benefit from skilled OT services in the outpatient setting to work on impairments as noted below, to increase ind for ADL tasks, to improve standing tolerance/endurance, and to improve understanding of adaptive strategies and A/E for ADL/IADL tasks.   PERFORMANCE DEFICITS: in functional skills including ADLs, IADLs, coordination, dexterity, proprioception, sensation, ROM, strength, pain, flexibility, Fine motor control, Gross motor control, mobility, balance, body mechanics, endurance, decreased knowledge of use of DME, and UE functional use, cognitive skills including energy/drive, and psychosocial skills including environmental adaptation.   IMPAIRMENTS: are limiting patient from ADLs, IADLs, leisure, and social participation.   CO-MORBIDITIES: may have co-morbidities  that affects occupational performance. Patient will benefit from skilled OT to address above impairments and improve overall function.  MODIFICATION OR ASSISTANCE TO COMPLETE EVALUATION: Min-Moderate modification of tasks or assist with assess necessary to complete an evaluation.  OT OCCUPATIONAL PROFILE AND HISTORY: Problem focused assessment: Including review of records  relating to presenting problem.  CLINICAL DECISION MAKING: LOW - limited treatment options, no task modification necessary  REHAB POTENTIAL: Good  EVALUATION COMPLEXITY: Low    PLAN:  OT FREQUENCY: 1x/week  OT DURATION: 6 weeks (dates extended to allow for scheduling)  PLANNED INTERVENTIONS: 97168 OT Re-evaluation, 97535 self care/ADL training, 46962 therapeutic exercise, 97530 therapeutic activity, 97112 neuromuscular re-education, 97140 manual therapy, 97760 Orthotics management and training, 95284 Splinting (initial encounter), [id number] Subsequent splinting/medication, passive range of motion, functional mobility training, energy conservation, patient/family education, and DME and/or AE instructions  RECOMMENDED  OTHER SERVICES: PT eval completed  CONSULTED AND AGREED WITH PLAN OF CARE: Patient and family member/caregiver  PLAN FOR NEXT SESSION:   Review HEP PRN  Educate on A/E options: transfer bench for tub shower, shower stool, long-handled sponge, bidet, toilet aid   Assess goals  Review LB dressing adaptive strategies to increase ind PRN - reacher and step stool   Wynetta Emery, OT 07/02/2023, 3:24 PM

## 2023-07-02 NOTE — Therapy (Signed)
OUTPATIENT PHYSICAL THERAPY LOWER EXTREMITY TREATMENT   Patient Name: Jeremy [name] MRN: [id number] DOB:[birth year], 88 y.o., male Today's Date: 07/02/2023  END OF SESSION:  PT End of Session - 07/02/23 1405     Visit Number 6    Number of Visits 13   with eval   Date for PT Re-Evaluation 08/02/23    Authorization Type Medicare    Progress Note Due on Visit 10    PT Start Time 1403    PT Stop Time 1446    PT Time Calculation (min) 43 min    Equipment Utilized During Treatment Gait belt    Activity Tolerance Patient tolerated treatment well    Behavior During Therapy Flat affect               Past Medical History:  Diagnosis Date   Aortic atherosclerosis (HCC)    BPH (benign prostatic hyperplasia)    Bronchiectasis (HCC)    Depression    Depression    GAD (generalized anxiety disorder)    Past Surgical History:  Procedure Laterality Date   APPENDECTOMY     HERNIA REPAIR     TOTAL HIP ARTHROPLASTY Right 05/21/2023   Procedure: RIGHT TOTAL HIP ARTHROPLASTY;  Surgeon: Kathryne Hitch, MD;  Location: [facility] OR;  Service: Orthopedics;  Laterality: Right;   Patient Active Problem List   Diagnosis Date Noted   Malnutrition of moderate degree 05/23/2023   Closed right hip fracture (HCC) 05/23/2023   Closed subcapital fracture of right femur (HCC) 05/21/2023   Closed right hip fracture, initial encounter (HCC) 05/20/2023   Normocytic anemia 05/20/2023   Bronchiectasis (HCC) 05/20/2023   First degree AV block 05/20/2023   Anxiety and depression 05/20/2023   BPH (benign prostatic hyperplasia) 05/20/2023   Severe depression (HCC) 06/09/2021   Generalized anxiety disorder 06/09/2021   History of psychosis 06/09/2021   Insomnia 09/11/2020    PCP: Irena Reichmann, DO  REFERRING PROVIDER: Charlton Amor, PA-C  REFERRING DIAG: S72.001A (ICD-10-CM) - Fracture of unspecified part of neck of right femur, initial encounter for closed fracture  THERAPY DIAG:  Muscle  weakness (generalized)  Unsteadiness on feet  Other abnormalities of gait and mobility  Pain in right hip  Rationale for Evaluation and Treatment: Rehabilitation  ONSET DATE: 05/30/2023 (referral date)  SUBJECTIVE:   SUBJECTIVE STATEMENT: Pt denies any falls or acute changes since last visit, pt has some right thigh soreness today.  Wife: [name] (in lobby)   PERTINENT HISTORY: PMH: BPH, bronchiectasis, severe depression, anxiety and aortic atherosclerosis  PAIN:  Are you having pain? Yes: NPRS scale: 3-4 Pain location: right thigh Pain description: soreness; constant Aggravating factors: none Relieving factors: none  PRECAUTIONS: Fall  RED FLAGS: None   WEIGHT BEARING RESTRICTIONS: Yes WBAT RLE  FALLS:  Has patient fallen in last 6 months? Yes. Number of falls 3 in the past 6 months, feel like legs slip out from under him; usually needs help to get back up  LIVING ENVIRONMENT: Lives with: lives with their spouse Lives in: House/apartment Stairs: Yes: External: 3-4 steps; has a post to hold onto Has following equipment at home: Dan Humphreys - 4 wheeled, shower chair, and Grab bars  OCCUPATION: retired  PLOF: Independent with gait, Independent with transfers, and Requires assistive device for independence  PATIENT GOALS: "walk independently"  NEXT MD VISIT: sees hip surgeon in 4 weeks (06/30/22)  OBJECTIVE:  Note: Objective measures were completed at Evaluation unless otherwise noted.  DIAGNOSTIC FINDINGS:  R hip  xray prior to surgery 05/20/23 FINDINGS: SI joints are non widened. Pubic symphysis and rami appear intact. Acute right femoral neck fracture. No femoral head dislocation   IMPRESSION: Acute right femoral neck fracture.  COGNITION: Overall cognitive status: Difficulty to assess due to: flat affect       POSTURE: rounded shoulders, forward head, and posterior pelvic tilt   LOWER EXTREMITY ROM:  Active ROM Right eval Left eval  Hip flexion  Decreased due to pain   Hip extension    Hip abduction    Hip adduction    Hip internal rotation    Hip external rotation    Knee flexion    Knee extension    Ankle dorsiflexion    Ankle plantarflexion    Ankle inversion    Ankle eversion     (Blank rows = not tested)  LOWER EXTREMITY MMT:  MMT Right eval Left eval  Hip flexion 4 5  Hip extension    Hip abduction    Hip adduction    Hip internal rotation    Hip external rotation    Knee flexion 4 5  Knee extension 4 5  Ankle dorsiflexion 5 5  Ankle plantarflexion    Ankle inversion    Ankle eversion     (Blank rows = not tested)                                                                                                                                 TREATMENT: LUE in sitting prior to interventions: There were no vitals filed for this visit.   Ther Ex  NuStep level 4 to 6 x 8 min with use of BUE/LE for neural priming for reciprocal movement, dynamic cardiovascular warmup, and improved ROM of RLE. RPE 6/10 following activity. There Act Verbally reviewed and provided requested reprint of HEP - pt states he has not been doing his HEP and he would like to review these in clinic today.  8 reps or 2x8 ft of each as written in HEP section below- pt reports he feels very challenged by these "like I need to be doing these!"  He declines new band as he reports he has his. 5xSTS no UE support:  18.56 sec, pt pauses between reps w/ rollator:  9.31 sec = 1.07 m/sec OR 3.54 ft/sec TUG w/ rollator:  16.09 sec BERG:  [facility] PT Assessment - 07/02/23 1436       Berg Balance Test   Sit to Stand Able to stand without using hands and stabilize independently    Standing Unsupported Able to stand safely 2 minutes    Sitting with Back Unsupported but Feet Supported on Floor or Stool Able to sit safely and securely 2 minutes    Stand to Sit Sits safely with minimal use of hands    Transfers Able to transfer safely, minor use  of hands    Standing Unsupported with Eyes Closed  Able to stand 10 seconds safely    Standing Unsupported with Feet Together Able to place feet together independently and stand 1 minute safely    From Standing, Reach Forward with Outstretched Arm Can reach confidently >25 cm (10")              PATIENT EDUCATION:  Education details: Please begin HEP- work towards 2 days a week.  Rollator safety especially when approaching sitting - brake management. Person educated: Patient Education method: Medical illustrator Education comprehension: verbalized understanding, returned demonstration, and needs further education  HOME EXERCISE PROGRAM: Access Code: [email or web address] URL: [email or web address] Date: 06/13/2023 Prepared by: Alethia Berthold Plaster  Exercises - Side Stepping with Resistance at Thighs and Counter Support  - 1 x daily - 7 x weekly - 3 sets - 10 reps - Forward Backward Monster Walk with Band at [employer] and Counter Support  - 1 x daily - 7 x weekly - 3 sets - 10 reps - Bird Dog  - 1 x daily - 7 x weekly - 2 sets - 10 reps - Squat with Chair Touch  - 1 x daily - 7 x weekly - 2 sets - 10 reps  ASSESSMENT:  CLINICAL IMPRESSION: Assessed STGs this visit with pt meeting 3 of 4 assessed.  BERG to be completed next visit.  He did not meet the one goal as pt is not compliant with his HEP.  This was reviewed today and current copy provided to pt and wife.  He was encouraged to complete this at least twice a week at a minimum.  He is making progress in gait speed, lower body strength, and generally lowering his fall risk.  Pt had mild trouble path finding during assessments today with more cuing required, but overall is doing very well and would likely benefit from transitioning to a cane.  Will continue per POC.  OBJECTIVE IMPAIRMENTS: Abnormal gait, decreased balance, decreased knowledge of use of DME, decreased mobility, decreased ROM, decreased strength, impaired UE  functional use, postural dysfunction, and pain.   ACTIVITY LIMITATIONS: carrying, lifting, bending, standing, stairs, transfers, and bed mobility  PARTICIPATION LIMITATIONS: meal prep, cleaning, driving, and community activity  PERSONAL FACTORS: Age and 1-2 comorbidities:    BPH, bronchiectasis, severe depression, anxiety and aortic atherosclerosisare also affecting patient's functional outcome.   REHAB POTENTIAL: Good  CLINICAL DECISION MAKING: Stable/uncomplicated  EVALUATION COMPLEXITY: Low   GOALS: Goals reviewed with patient? Yes  SHORT TERM GOALS: Target date: 06/28/2023   Pt will be independent with initial HEP for improved strength, balance, transfers and gait. Baseline: not compliant (1/28) Goal status: NOT MET  2.  Pt will improve 5 x STS to less than or equal to 22 seconds to demonstrate improved functional strength and transfer efficiency.  Baseline: 26.18 sec no UE (1/3); 18.56 sec no UE (1/28) Goal status: MET  3.  Pt will improve gait velocity to at least 3.25 ft/sec for improved gait efficiency and performance at mod I level  Baseline: 2.92 ft/sec with rollator (1/3); 3.54 ft/sec w/ rollator (1/28) Goal status: MET  4.  Pt will improve normal TUG to less than or equal to 19 seconds for improved functional mobility and decreased fall risk. Baseline: 22.62 sec with rollator (1/3); 16.09 sec w/ rollator (1/28) Goal status: MET  5.  Pt will improve Berg score to 37/56 for decreased fall risk Baseline: 33/56 (1/3) Goal status: INITIAL   LONG TERM GOALS: Target date: 07/19/2023    Pt will be independent with  final HEP for improved strength, balance, transfers and gait. Baseline:  Goal status: INITIAL  2.  Pt will improve 5 x STS to less than or equal to 18 seconds to demonstrate improved functional strength and transfer efficiency.  Baseline: 26.18 sec no UE (1/3) Goal status: INITIAL  3.  Pt will improve gait velocity to at least 3.75 ft/sec for improved  gait efficiency and performance at mod I level  Baseline: 2.92 ft/sec with rollator (1/3); 3.54 ft/sec w/ rollator (1/28) Goal status: REVISED  4.  Pt will improve normal TUG to less than or equal to 13 seconds for improved functional mobility and decreased fall risk. Baseline: 22.62 sec with rollator (1/3); 16.09 sec w/ rollator (1/28) Goal status: REVISED  5.  Pt will improve Berg score to 41/56 for decreased fall risk Baseline: 33/56 (1/3) Goal status: INITIAL     PLAN:  PT FREQUENCY: 2x/week  PT DURATION: 6 weeks  PLANNED INTERVENTIONS: 13086- PT Re-evaluation, 97110-Therapeutic exercises, 97530- Therapeutic activity, 97112- Neuromuscular re-education, 97535- Self Care, [id number]- Manual therapy, [phone]- Gait training, Patient/Family education, Balance training, Stair training, Taping, Dry Needling, Scar mobilization, DME instructions, Cryotherapy, and Moist heat  PLAN FOR NEXT SESSION: pt goal is to get away from rollator; add to HEP to work on functional strengthening and balance (sit to stands, balance with decreased UE support), lifting and functional strength , progress deadlift height, standing to taps, tilt board, forward bend to cone tap, step out slam balls, leg press?  Foam beam - ball toss, tandem, heel raise; reactive balance - perturbations in // bars, resisted STS > slam ball; FINISH BERG AND UPDATE STG #5!  Transition to cane?   Sadie Haber, PT, DPT   07/02/2023, 3:20 PM

## 2023-07-04 ENCOUNTER — Ambulatory Visit: Payer: Medicare Other | Admitting: Physical Therapy

## 2023-07-04 VITALS — BP 127/72 | HR 77

## 2023-07-04 DIAGNOSIS — M6281 Muscle weakness (generalized): Secondary | ICD-10-CM | POA: Diagnosis not present

## 2023-07-04 DIAGNOSIS — R2681 Unsteadiness on feet: Secondary | ICD-10-CM

## 2023-07-04 DIAGNOSIS — M25551 Pain in right hip: Secondary | ICD-10-CM

## 2023-07-04 DIAGNOSIS — R2689 Other abnormalities of gait and mobility: Secondary | ICD-10-CM

## 2023-07-04 NOTE — Therapy (Signed)
 OUTPATIENT PHYSICAL THERAPY LOWER EXTREMITY TREATMENT   Patient Name: Jeremy [name] MRN: [id number] DOB:22-Jul-1935, 88 y.o., male Today's Date: 07/04/2023  END OF SESSION:  PT End of Session - 07/04/23 1455     Visit Number 7    Number of Visits 13   with eval   Date for PT Re-Evaluation 08/02/23    Authorization Type Medicare    Progress Note Due on Visit 10    PT Start Time 1455   pt arrived late   PT Stop Time 1527    PT Time Calculation (min) 32 min    Equipment Utilized During Treatment Gait belt    Activity Tolerance Patient tolerated treatment well    Behavior During Therapy Flat affect                Past Medical History:  Diagnosis Date   Aortic atherosclerosis (HCC)    BPH (benign prostatic hyperplasia)    Bronchiectasis (HCC)    Depression    Depression    GAD (generalized anxiety disorder)    Past Surgical History:  Procedure Laterality Date   APPENDECTOMY     HERNIA REPAIR     TOTAL HIP ARTHROPLASTY Right 05/21/2023   Procedure: RIGHT TOTAL HIP ARTHROPLASTY;  Surgeon: Kathryne Hitch, MD;  Location: [facility] OR;  Service: Orthopedics;  Laterality: Right;   Patient Active Problem List   Diagnosis Date Noted   Malnutrition of moderate degree 05/23/2023   Closed right hip fracture (HCC) 05/23/2023   Closed subcapital fracture of right femur (HCC) 05/21/2023   Closed right hip fracture, initial encounter (HCC) 05/20/2023   Normocytic anemia 05/20/2023   Bronchiectasis (HCC) 05/20/2023   First degree AV block 05/20/2023   Anxiety and depression 05/20/2023   BPH (benign prostatic hyperplasia) 05/20/2023   Severe depression (HCC) 06/09/2021   Generalized anxiety disorder 06/09/2021   History of psychosis 06/09/2021   Insomnia 09/11/2020    PCP: Irena Reichmann, DO  REFERRING PROVIDER: Charlton Amor, PA-C  REFERRING DIAG: S72.001A (ICD-10-CM) - Fracture of unspecified part of neck of right femur, initial encounter for closed  fracture  THERAPY DIAG:  Muscle weakness (generalized)  Unsteadiness on feet  Pain in right hip  Other abnormalities of gait and mobility  Rationale for Evaluation and Treatment: Rehabilitation  ONSET DATE: 05/30/2023 (referral date)  SUBJECTIVE:   SUBJECTIVE STATEMENT: Pt denies any pain of significance today, denies any acute changes since last visit. Pt feeling "eh" today, just generally not great.  Wife: [name] (in lobby)   PERTINENT HISTORY: PMH: BPH, bronchiectasis, severe depression, anxiety and aortic atherosclerosis  PAIN:  Are you having pain? Yes: NPRS scale: 3-4 Pain location: right thigh Pain description: soreness; constant Aggravating factors: none Relieving factors: none  PRECAUTIONS: Fall  RED FLAGS: None   WEIGHT BEARING RESTRICTIONS: Yes WBAT RLE  FALLS:  Has patient fallen in last 6 months? Yes. Number of falls 3 in the past 6 months, feel like legs slip out from under him; usually needs help to get back up  LIVING ENVIRONMENT: Lives with: lives with their spouse Lives in: House/apartment Stairs: Yes: External: 3-4 steps; has a post to hold onto Has following equipment at home: Dan Humphreys - 4 wheeled, shower chair, and Grab bars  OCCUPATION: retired  PLOF: Independent with gait, Independent with transfers, and Requires assistive device for independence  PATIENT GOALS: "walk independently"  NEXT MD VISIT: sees hip surgeon in 4 weeks (06/30/22)  OBJECTIVE:  Note: Objective measures were completed at  Evaluation unless otherwise noted.  DIAGNOSTIC FINDINGS:  R hip xray prior to surgery 05/20/23 FINDINGS: SI joints are non widened. Pubic symphysis and rami appear intact. Acute right femoral neck fracture. No femoral head dislocation   IMPRESSION: Acute right femoral neck fracture.  COGNITION: Overall cognitive status: Difficulty to assess due to: flat affect       POSTURE: rounded shoulders, forward head, and posterior pelvic  tilt   LOWER EXTREMITY ROM:  Active ROM Right eval Left eval  Hip flexion Decreased due to pain   Hip extension    Hip abduction    Hip adduction    Hip internal rotation    Hip external rotation    Knee flexion    Knee extension    Ankle dorsiflexion    Ankle plantarflexion    Ankle inversion    Ankle eversion     (Blank rows = not tested)  LOWER EXTREMITY MMT:  MMT Right eval Left eval  Hip flexion 4 5  Hip extension    Hip abduction    Hip adduction    Hip internal rotation    Hip external rotation    Knee flexion 4 5  Knee extension 4 5  Ankle dorsiflexion 5 5  Ankle plantarflexion    Ankle inversion    Ankle eversion     (Blank rows = not tested)                                                                                                                                 TREATMENT: LUE in sitting prior to interventions: Vitals:   07/07/23 1307  BP: 127/72  Pulse: 77    Ther Act For STG assessment:  [facility] PT Assessment - 07/04/23 1457       Berg Balance Test   Sit to Stand Able to stand without using hands and stabilize independently    Standing Unsupported Able to stand safely 2 minutes    Sitting with Back Unsupported but Feet Supported on Floor or Stool Able to sit safely and securely 2 minutes    Stand to Sit Sits safely with minimal use of hands    Transfers Able to transfer safely, minor use of hands    Standing Unsupported with Eyes Closed Able to stand 10 seconds safely    Standing Unsupported with Feet Together Able to place feet together independently and stand 1 minute safely    From Standing, Reach Forward with Outstretched Arm Can reach confidently >25 cm (10")    From Standing Position, Pick up Object from Floor Able to pick up shoe, needs supervision    From Standing Position, Turn to Look Behind Over each Shoulder Looks behind from both sides and weight shifts well    Turn 360 Degrees Able to turn 360 degrees safely in 4 seconds  or less    Standing Unsupported, Alternately Place Feet on Step/Stool Able to stand independently and safely  and complete 8 steps in 20 seconds    Standing Unsupported, One Foot in Front Able to take small step independently and hold 30 seconds    Standing on One Leg Able to lift leg independently and hold > 10 seconds    Total Score 53    Berg comment: 53/56, low fall risk            To work on increasing step height and foot clearance with gait: Alt L/R 2" foam beam step overs 6 x 10 ft forwards Alt L/R 2" foam beam step overs 3 x 10 ft L/R laterally Alt L/R 4" hurdle step overs 6 x 10 ft with progression from step-to gait pattern to step-through gait pattern Initially with LOB with transition to step-through but improves as task progresses  Gait Gait pattern: decreased step length- Left, shuffling, and narrow BOS Distance walked: 115 ft Assistive device utilized: Quad cane small base Level of assistance: CGA Comments: SBQC is in patient's way, exhibits decreased step length with LLE and narrowed BOS, shuffles feet  Gait pattern: decreased step length- Left, shuffling, and narrow BOS Distance walked: 115 ft Assistive device utilized: Single point cane Level of assistance: CGA Comments: device less in patient's way than SBQC was but still cumbersome and pt tends to carry device  Gait pattern: decreased step length- Right, decreased step length- Left, shuffling, poor foot clearance- Right, and poor foot clearance- Left Distance walked: 115 ft Assistive device utilized:  trekking pole Level of assistance: CGA Comments: no significant improvement in gait pattern or patient's balance with use of trekking pole as compared to canes trialed this session  Gait pattern: decreased hip/knee flexion- Right, decreased hip/knee flexion- Left, decreased ankle dorsiflexion- Right, decreased ankle dorsiflexion- Left, shuffling, poor foot clearance- Right, and poor foot clearance- Left Distance  walked: 115 ft Assistive device utilized: None Level of assistance: CGA Comments: ongoing gait impairments as noted above however pt appears safest without use of any device as compared to with any of the canes triled this session; encouraged him to start walking short distances in his home with no device with his wife present, otherwise continue to use his rollator    PATIENT EDUCATION:  Education details: Please begin HEP- work towards 2 days a week.  Rollator safety especially when approaching sitting - brake management.encouraged him to try some walking at home no AD Person educated: Patient Education method: Explanation and Demonstration Education comprehension: verbalized understanding, returned demonstration, and needs further education  HOME EXERCISE PROGRAM: Access Code: [email or web address] URL: [email or web address] Date: 06/13/2023 Prepared by: Alethia Berthold Plaster  Exercises - Side Stepping with Resistance at Thighs and Counter Support  - 1 x daily - 7 x weekly - 3 sets - 10 reps - Forward Backward Monster Walk with Band at [employer] and Counter Support  - 1 x daily - 7 x weekly - 3 sets - 10 reps - Bird Dog  - 1 x daily - 7 x weekly - 2 sets - 10 reps - Squat with Chair Touch  - 1 x daily - 7 x weekly - 2 sets - 10 reps  ASSESSMENT:  CLINICAL IMPRESSION: Session limited by patient's late arrival and not feeling well. Emphasis of skilled PT session on assessing remaining STG not completed last session, assessing gait with various AD to determine least restrictive recommendation, and working on improving step height and LE clearance with gait. Pt has met 4/5 STG due to improving scores on all of his objective measures, did not  meet goal for being independent with his initial HEP as he continues to need encouragement to work on this at home. He also exhibits no significant improvement in his balance or gait mechanics with use of a SPC, SBQC, or a trekking pole and the devices actually  impede his gait and make him less safe. Pt exhibits minor gait impairments and impaired balance during gait without an AD, encouraged him to work on short distance gait in his home without an AD with his wife present. Pt does continue to benefit from skilled PT services to work towards improving his gait mechanics in order to decrease his fall risk and increase his safety and independence with functional mobility. Continue POC.   OBJECTIVE IMPAIRMENTS: Abnormal gait, decreased balance, decreased knowledge of use of DME, decreased mobility, decreased ROM, decreased strength, impaired UE functional use, postural dysfunction, and pain.   ACTIVITY LIMITATIONS: carrying, lifting, bending, standing, stairs, transfers, and bed mobility  PARTICIPATION LIMITATIONS: meal prep, cleaning, driving, and community activity  PERSONAL FACTORS: Age and 1-2 comorbidities:    BPH, bronchiectasis, severe depression, anxiety and aortic atherosclerosisare also affecting patient's functional outcome.   REHAB POTENTIAL: Good  CLINICAL DECISION MAKING: Stable/uncomplicated  EVALUATION COMPLEXITY: Low   GOALS: Goals reviewed with patient? Yes  SHORT TERM GOALS: Target date: 06/28/2023   Pt will be independent with initial HEP for improved strength, balance, transfers and gait. Baseline: not compliant (1/28) Goal status: NOT MET  2.  Pt will improve 5 x STS to less than or equal to 22 seconds to demonstrate improved functional strength and transfer efficiency.  Baseline: 26.18 sec no UE (1/3); 18.56 sec no UE (1/28) Goal status: MET  3.  Pt will improve gait velocity to at least 3.25 ft/sec for improved gait efficiency and performance at mod I level  Baseline: 2.92 ft/sec with rollator (1/3); 3.54 ft/sec w/ rollator (1/28) Goal status: MET  4.  Pt will improve normal TUG to less than or equal to 19 seconds for improved functional mobility and decreased fall risk. Baseline: 22.62 sec with rollator (1/3);  16.09 sec w/ rollator (1/28) Goal status: MET  5.  Pt will improve Berg score to 37/56 for decreased fall risk Baseline: 33/56 (1/3), 53/56 (1/30) Goal status: MET   LONG TERM GOALS: Target date: 07/19/2023    Pt will be independent with final HEP for improved strength, balance, transfers and gait. Baseline:  Goal status: INITIAL  2.  Pt will improve 5 x STS to less than or equal to 18 seconds to demonstrate improved functional strength and transfer efficiency.  Baseline: 26.18 sec no UE (1/3) Goal status: INITIAL  3.  Pt will improve gait velocity to at least 3.75 ft/sec for improved gait efficiency and performance at mod I level  Baseline: 2.92 ft/sec with rollator (1/3); 3.54 ft/sec w/ rollator (1/28) Goal status: REVISED  4.  Pt will improve normal TUG to less than or equal to 13 seconds for improved functional mobility and decreased fall risk. Baseline: 22.62 sec with rollator (1/3); 16.09 sec w/ rollator (1/28) Goal status: REVISED  5.  Pt will improve Berg score to 41/56 for decreased fall risk Baseline: 33/56 (1/3), 53/56 (1/30) Goal status: MET     PLAN:  PT FREQUENCY: 2x/week  PT DURATION: 6 weeks  PLANNED INTERVENTIONS: 97164- PT Re-evaluation, 97110-Therapeutic exercises, 97530- Therapeutic activity, 97112- Neuromuscular re-education, 97535- Self Care, [address]- Manual therapy, [phone]- Gait training, Patient/Family education, Balance training, Stair training, Taping, Dry Needling, Scar mobilization, DME instructions,  Cryotherapy, and Moist heat  PLAN FOR NEXT SESSION: pt goal is to get away from rollator; add to HEP to work on functional strengthening and balance (sit to stands, balance with decreased UE support), lifting and functional strength , progress deadlift height, standing to taps, tilt board, forward bend to cone tap, step out slam balls, leg press?  Foam beam - ball toss, tandem, heel raise; reactive balance - perturbations in // bars, resisted STS > slam  ball; how is gait with no AD at home?, work on increasing step length, step height, LE clearance, SLS   Peter Congo, PT Peter Congo, PT, DPT, CSRS    07/04/2023, 3:28 PM

## 2023-07-08 ENCOUNTER — Ambulatory Visit: Payer: Medicare Other | Admitting: Psychiatry

## 2023-07-08 DIAGNOSIS — F339 Major depressive disorder, recurrent, unspecified: Secondary | ICD-10-CM | POA: Diagnosis not present

## 2023-07-08 DIAGNOSIS — Z9189 Other specified personal risk factors, not elsewhere classified: Secondary | ICD-10-CM

## 2023-07-08 NOTE — Progress Notes (Signed)
Psychotherapy Progress Note [facility] [name], PhD LP   Patient ID: Jeremy [name])    MRN: [id number] Therapy format: Individual psychotherapy Date: 07/08/2023      Start: 3:08p     Stop: 3:56p     Time Spent: 48 min Location: In-person   Session narrative (presenting needs, interim history, self-report of stressors and symptoms, applications of prior therapy, status changes, and interventions made in session) Last seen in therapy 4 months ago, with serial cancellations to follow, suspected to be self-sabotaging in severe depression.  Since then, word received that he took a fall, has been recovering from a fracture, and has been rebounding in mood and weight in the wake of all the required attention and caregiving.  Turns out this appt was made Nov 8, the only therapy appt, then Nov 17 was his critical fall, acc to him (Dec 16, acc to chart, 12/17 surgery).  Today, immediately more talkative than last known, and more mobile, even with Rollator and stronger affect.  Says he fell 3x in about a month's time, did sustain a fracture as noted, with a full hip replacement.  Has apparently been working PT well, and permission as of last week to move without Rollator at home.  PT and OT continues, at neuro rehab facility.  Appreciated his ortho surgeon's vivid personality during recovery.  Claims he's "still in a vegetative state" at home, meaning he continues to retreat to bed long periods of time, despite regained functioning and some restored weight.  Falls tended to happen while trying to dress, he says, or when he missed seeing a board and tripped in their walk-in attic, while trying to show friends what to do to help with needed and neglected chores.  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  Both attributed both to his  impaired grip (both hands still spastic) and "mentally" (perceived unable to initiate).  Encouraged to try anyway, consider recruiting his OT into hand work again, even if his experience before was that he bombed out (for lack of doing his own exercises at home).  Probed goals and practices, supportively confronted about resuming self-sabotage at home.  Discussed possibility of interacting more with his son, who just visited but allegedly was only there to see his mother.    Therapeutic modalities: Cognitive Behavioral Therapy, Solution-Oriented/Positive Psychology, Ego-Supportive, and family intervention  Mental Status/Observations:  Appearance:   Casual and Neat     Behavior:  Resistant  Motor:  Normal and with Rollator light assist  Speech/Language:   Clear and Coherent  Affect:  Appropriate and more responsive  Mood:  anxious and depressed  Thought process:  normal  Thought content:    Ilusions and Obsessions  Sensory/Perceptual disturbances:    WNL and r/o somatization  Orientation:  Fully oriented  Attention:  Good    Concentration:  Good  Memory:  WNL  Insight:    Fair  Judgment:   Fair  Impulse Control:  Fair   Risk Assessment: Danger to Self: No Self-injurious Behavior: No Danger to Others: No Physical Aggression / Violence: No Duty to Warn: No Access to Firearms a concern: No  Assessment of progress:  stabilized  Diagnosis:   ICD-10-CM   1. Recurrent major depression resistant to treatment [facility]); with hx psychosis  F33.9  2. Potential for self care deficit  Z91.89      Plan:  Priority recommendations today:  Maintain nutrition & hydration Consider involving OT in his ADL limitations; prefer standby assist to doing it for him Try again to get mobile and in touch with others vs. extended time in bed Available to consult with family and therapies Physical activation -- Still encourage to stretch, walk, even if it is only once down the driveway and back once,  and break up long times on the bed.  Endorse frequent, short walks in the house or out to break up malaise.  Recommend fully engage physical therapy to address back pain, overall flexibility and stamina, as well as psychosomatic hand immobilization.  Apply small steps, matter of factness  Social activation -- Endorse wife being more assertive, "Nurse Jeremy [name]" as she calls it, and recruiting persistent friend Jeremy [name] to visit and walk with Jeremy [name].  May consider hiring an in-home companion for respite or ongoing assistance.  Encourage Jeremy [name] to check his phone for the same family information Jeremy [name] gets, not let it drift into disconnection and the appearance of being disinterested. Nutrition -- Fully adopt vitamin supplementation as recommended to ensure availability of B complex, D, protein, and antiinflammatory/neuroprotective elements like omega 3 and turmeric.  Improve hydration with monitored minimum 32 oz/day intake.  Assume patient's estimation is unreliable, needs a witness to assess.  Guidelines 32 oz/day, 4 oz immediately any time BP measures below 60. Bowels -- May stick with temperate dosing of Miralax but seek fluids, fiber, and more movement to recover natural bowel control Potential placement or auxiliary services -- Consider criteria and trigger conditions to engage companion service, home health, or home-based PT.  Continue to consider referral to psychiatric hospital (geri program, e.g., [place]) or rehab, to address intractable self-care deficits and wasting. Health care side issues -- Watch for health care sequelae of long time in bed, e.g., the fungal infection he had..   Coordination of care -- Reserve option to consult Jeremy [name] at any time, with or without further detailed consent. Other recommendations/advice -- As may be noted above.  Continue to utilize previously learned skills ad lib. Medication compliance -- Maintain medication as prescribed and work faithfully with relevant prescriber(s)  if any changes are desired or seem indicated. Crisis service -- Aware of call list and work-in appts.  Call the clinic on-call service, 988/hotline, 911, or present to [facility] or ER if any life-threatening psychiatric crisis. Followup -- Return for undecided; recommend monthly at least.  Next scheduled visit with me Visit date not found.  Next scheduled in this office 07/17/2023.  Robley Fries, PhD [name], PhD LP Clinical Psychologist, [facility] [address], [address] [place], [place] [address] [phone]

## 2023-07-09 ENCOUNTER — Ambulatory Visit: Payer: Medicare Other | Admitting: Occupational Therapy

## 2023-07-09 ENCOUNTER — Ambulatory Visit: Payer: Medicare Other | Attending: Physician Assistant | Admitting: Physical Therapy

## 2023-07-09 VITALS — BP 117/65 | HR 71

## 2023-07-09 DIAGNOSIS — M6281 Muscle weakness (generalized): Secondary | ICD-10-CM

## 2023-07-09 DIAGNOSIS — R2689 Other abnormalities of gait and mobility: Secondary | ICD-10-CM | POA: Insufficient documentation

## 2023-07-09 DIAGNOSIS — M25551 Pain in right hip: Secondary | ICD-10-CM | POA: Insufficient documentation

## 2023-07-09 DIAGNOSIS — S72001S Fracture of unspecified part of neck of right femur, sequela: Secondary | ICD-10-CM | POA: Diagnosis not present

## 2023-07-09 DIAGNOSIS — R2681 Unsteadiness on feet: Secondary | ICD-10-CM | POA: Diagnosis not present

## 2023-07-09 NOTE — Therapy (Signed)
 OUTPATIENT PHYSICAL THERAPY LOWER EXTREMITY TREATMENT   Patient Name: UGONNA KEIRSEY MRN: [id number] DOB:April 01, 1936, 88 y.o., male Today's Date: 07/09/2023  END OF SESSION:  PT End of Session - 07/09/23 1321     Visit Number 8    Number of Visits 13   with eval   Date for PT Re-Evaluation 08/02/23    Authorization Type Medicare    Progress Note Due on Visit 10    PT Start Time 1320   pt arrived late   PT Stop Time 1358    PT Time Calculation (min) 38 min    Equipment Utilized During Treatment Gait belt    Activity Tolerance Patient tolerated treatment well    Behavior During Therapy Flat affect                 Past Medical History:  Diagnosis Date   Aortic atherosclerosis (HCC)    BPH (benign prostatic hyperplasia)    Bronchiectasis (HCC)    Depression    Depression    GAD (generalized anxiety disorder)    Past Surgical History:  Procedure Laterality Date   APPENDECTOMY     HERNIA REPAIR     TOTAL HIP ARTHROPLASTY Right 05/21/2023   Procedure: RIGHT TOTAL HIP ARTHROPLASTY;  Surgeon: Vernetta Lonni GRADE, MD;  Location: [facility] OR;  Service: Orthopedics;  Laterality: Right;   Patient Active Problem List   Diagnosis Date Noted   Malnutrition of moderate degree 05/23/2023   Closed right hip fracture (HCC) 05/23/2023   Closed subcapital fracture of right femur (HCC) 05/21/2023   Closed right hip fracture, initial encounter (HCC) 05/20/2023   Normocytic anemia 05/20/2023   Bronchiectasis (HCC) 05/20/2023   First degree AV block 05/20/2023   Anxiety and depression 05/20/2023   BPH (benign prostatic hyperplasia) 05/20/2023   Severe depression (HCC) 06/09/2021   Generalized anxiety disorder 06/09/2021   History of psychosis 06/09/2021   Insomnia 09/11/2020    PCP: Gerome Brunet, DO  REFERRING PROVIDER: Pegge Toribio PARAS, PA-C  REFERRING DIAG: S72.001A (ICD-10-CM) - Fracture of unspecified part of neck of right femur, initial encounter for closed  fracture  THERAPY DIAG:  Muscle weakness (generalized)  Unsteadiness on feet  Pain in right hip  Other abnormalities of gait and mobility  Closed fracture of right hip, sequela  Rationale for Evaluation and Treatment: Rehabilitation  ONSET DATE: 05/30/2023 (referral date)  SUBJECTIVE:   SUBJECTIVE STATEMENT: Pt denies any falls or acute changes since last visit. Pt denies having any pain today. Pt reports he has done some walking at home without his rollator and that is going well.  Wife: [name] (in lobby)   PERTINENT HISTORY: PMH: BPH, bronchiectasis, severe depression, anxiety and aortic atherosclerosis  PAIN:  Are you having pain? No  PRECAUTIONS: Fall  RED FLAGS: None   WEIGHT BEARING RESTRICTIONS: Yes WBAT RLE  FALLS:  Has patient fallen in last 6 months? Yes. Number of falls 3 in the past 6 months, feel like legs slip out from under him; usually needs help to get back up  LIVING ENVIRONMENT: Lives with: lives with their spouse Lives in: House/apartment Stairs: Yes: External: 3-4 steps; has a post to hold onto Has following equipment at home: Vannie - 4 wheeled, shower chair, and Grab bars  OCCUPATION: retired  PLOF: Independent with gait, Independent with transfers, and Requires assistive device for independence  PATIENT GOALS: walk independently  NEXT MD VISIT: sees hip surgeon in 4 weeks (06/30/22)  OBJECTIVE:  Note: Objective measures were  completed at Evaluation unless otherwise noted.  DIAGNOSTIC FINDINGS:  R hip xray prior to surgery 05/20/23 FINDINGS: SI joints are non widened. Pubic symphysis and rami appear intact. Acute right femoral neck fracture. No femoral head dislocation   IMPRESSION: Acute right femoral neck fracture.  COGNITION: Overall cognitive status: Difficulty to assess due to: flat affect       POSTURE: rounded shoulders, forward head, and posterior pelvic tilt   LOWER EXTREMITY ROM:  Active ROM Right eval  Left eval  Hip flexion Decreased due to pain   Hip extension    Hip abduction    Hip adduction    Hip internal rotation    Hip external rotation    Knee flexion    Knee extension    Ankle dorsiflexion    Ankle plantarflexion    Ankle inversion    Ankle eversion     (Blank rows = not tested)  LOWER EXTREMITY MMT:  MMT Right eval Left eval  Hip flexion 4 5  Hip extension    Hip abduction    Hip adduction    Hip internal rotation    Hip external rotation    Knee flexion 4 5  Knee extension 4 5  Ankle dorsiflexion 5 5  Ankle plantarflexion    Ankle inversion    Ankle eversion     (Blank rows = not tested)                                                                                                                                 TREATMENT: LUE in sitting prior to interventions: Vitals:   07/09/23 1325  BP: 117/65  Pulse: 71   Ther Act Blaze pods on random setting for improved dynamic balance on a compliant surface and SLS.  Performed on 1 minute intervals with 30 rest periods.  Pt requires CGA to min A guarding. Round 1:  compliant surface with 3 on each side of blue mat setup.  9 hits. Round 2:  compliant surface with 3 on each side of blue mat setup.  11 hits. Round 3:  compliant surface with 3 on each side of blue mat setup.  11 hits. Notable errors/deficits:  decreased balance in SLS and with quick turns Added in 4 and 6 step under pods Round 1:  compliant surface with 3 on each side of blue mat setup.  12 hits. Round 2:  compliant surface with 3 on each side of blue mat setup.  11 hits. Round 3:  compliant surface with 3 on each side of blue mat setup.  12 hits. Notable errors/deficits:  decreased balance in SLS and with quick turns especially when tapping pods on 4 or 6 step   To work on dynamic balance on a compliant surface and SLS: Alt L/R blaze pods taps with gait across blue mat Foam beam step overs on blue mat 6 x 5 ft forwards stepping with  alt  L/R LE leading Decreased balance with visual distractions 3 x 5 ft laterally L/R    PATIENT EDUCATION:  Education details: continue to work towards 2 days a week for [employer], continue to use rollator for community mobility and no AD at home Person educated: Patient Education method: Explanation Education comprehension: verbalized understanding and needs further education  HOME EXERCISE PROGRAM: Access Code: [email or web address] URL: [email or web address] Date: 06/13/2023 Prepared by: Marlon Plaster  Exercises - Side Stepping with Resistance at Thighs and Counter Support  - 1 x daily - 7 x weekly - 3 sets - 10 reps - Forward Backward Monster Walk with Band at [employer] and Counter Support  - 1 x daily - 7 x weekly - 3 sets - 10 reps - Bird Dog  - 1 x daily - 7 x weekly - 2 sets - 10 reps - Squat with Chair Touch  - 1 x daily - 7 x weekly - 2 sets - 10 reps  ASSESSMENT:  CLINICAL IMPRESSION: Emphasis of skilled PT session on continuing to work on dynamic standing balance, SLS, and increasing step length and step height during gait. Pt continues to exhibit good balance without use of AD on a level surface in a controlled environment, does exhibit impaired balance with uneven surfaces, when encountering obstacles, or when distracted. Pt continues to benefit from skilled PT services to work on improving his functional strength and balance in order to decrease his fall risk. Continue POC.    OBJECTIVE IMPAIRMENTS: Abnormal gait, decreased balance, decreased knowledge of use of DME, decreased mobility, decreased ROM, decreased strength, impaired UE functional use, postural dysfunction, and pain.   ACTIVITY LIMITATIONS: carrying, lifting, bending, standing, stairs, transfers, and bed mobility  PARTICIPATION LIMITATIONS: meal prep, cleaning, driving, and community activity  PERSONAL FACTORS: Age and 1-2 comorbidities:    BPH, bronchiectasis, severe depression, anxiety and  aortic atherosclerosisare also affecting patient's functional outcome.   REHAB POTENTIAL: Good  CLINICAL DECISION MAKING: Stable/uncomplicated  EVALUATION COMPLEXITY: Low   GOALS: Goals reviewed with patient? Yes  SHORT TERM GOALS: Target date: 06/28/2023   Pt will be independent with initial HEP for improved strength, balance, transfers and gait. Baseline: not compliant (1/28) Goal status: NOT MET  2.  Pt will improve 5 x STS to less than or equal to 22 seconds to demonstrate improved functional strength and transfer efficiency.  Baseline: 26.18 sec no UE (1/3); 18.56 sec no UE (1/28) Goal status: MET  3.  Pt will improve gait velocity to at least 3.25 ft/sec for improved gait efficiency and performance at mod I level  Baseline: 2.92 ft/sec with rollator (1/3); 3.54 ft/sec w/ rollator (1/28) Goal status: MET  4.  Pt will improve normal TUG to less than or equal to 19 seconds for improved functional mobility and decreased fall risk. Baseline: 22.62 sec with rollator (1/3); 16.09 sec w/ rollator (1/28) Goal status: MET  5.  Pt will improve Berg score to 37/56 for decreased fall risk Baseline: 33/56 (1/3), 53/56 (1/30) Goal status: MET   LONG TERM GOALS: Target date: 07/19/2023    Pt will be independent with final HEP for improved strength, balance, transfers and gait. Baseline:  Goal status: INITIAL  2.  Pt will improve 5 x STS to less than or equal to 18 seconds to demonstrate improved functional strength and transfer efficiency.  Baseline: 26.18 sec no UE (1/3) Goal status: INITIAL  3.  Pt will improve gait velocity to at least 3.75 ft/sec for improved gait efficiency  and performance at mod I level  Baseline: 2.92 ft/sec with rollator (1/3); 3.54 ft/sec w/ rollator (1/28) Goal status: REVISED  4.  Pt will improve normal TUG to less than or equal to 13 seconds for improved functional mobility and decreased fall risk. Baseline: 22.62 sec with rollator (1/3); 16.09  sec w/ rollator (1/28) Goal status: REVISED  5.  Pt will improve Berg score to 41/56 for decreased fall risk Baseline: 33/56 (1/3), 53/56 (1/30) Goal status: MET     PLAN:  PT FREQUENCY: 2x/week  PT DURATION: 6 weeks  PLANNED INTERVENTIONS: 02835- PT Re-evaluation, 97110-Therapeutic exercises, 97530- Therapeutic activity, 97112- Neuromuscular re-education, 97535- Self Care, [id number]- Manual therapy, [phone]- Gait training, Patient/Family education, Balance training, Stair training, Taping, Dry Needling, Scar mobilization, DME instructions, Cryotherapy, and Moist heat  PLAN FOR NEXT SESSION: pt goal is to get away from rollator (signed him off to do this at home with no AD, still use rollator for community mobility); add to HEP to work on functional strengthening and balance (sit to stands, balance with decreased UE support), lifting and functional strength , progress deadlift height, standing to taps, tilt board, forward bend to cone tap, step out slam balls, leg press?  Foam beam - ball toss, tandem, heel raise; reactive balance - perturbations in // bars, resisted STS > slam ball; work on increasing step length, step height, LE clearance, SLS; gait outdoors across various surfaces, curbs, etc.   Waddell Southgate, PT Waddell Southgate, PT, DPT, CSRS    07/09/2023, 2:02 PM

## 2023-07-09 NOTE — Therapy (Signed)
 OUTPATIENT OCCUPATIONAL THERAPY NEURO Treatment  Patient Name: Jeremy [name] MRN: [id number] DOB:12/12/1935, 88 y.o., male Today's Date: 07/09/2023  PCP: Gerome Brunet, DO  REFERRING PROVIDER: Pegge Toribio PARAS, PA-C   END OF SESSION:  OT End of Session - 07/09/23 1524     Visit Number 5    Number of Visits 7   including eval   Date for OT Re-Evaluation 08/02/23    Authorization Type Medicare/BCBS/[employer]    Progress Note Due on Visit 10    OT Start Time 1402    OT Stop Time 1444    OT Time Calculation (min) 42 min    Activity Tolerance Patient tolerated treatment well    Behavior During Therapy Flat affect                 Past Medical History:  Diagnosis Date   Aortic atherosclerosis (HCC)    BPH (benign prostatic hyperplasia)    Bronchiectasis (HCC)    Depression    Depression    GAD (generalized anxiety disorder)    Past Surgical History:  Procedure Laterality Date   APPENDECTOMY     HERNIA REPAIR     TOTAL HIP ARTHROPLASTY Right 05/21/2023   Procedure: RIGHT TOTAL HIP ARTHROPLASTY;  Surgeon: Vernetta Lonni GRADE, MD;  Location: [facility] OR;  Service: Orthopedics;  Laterality: Right;   Patient Active Problem List   Diagnosis Date Noted   Malnutrition of moderate degree 05/23/2023   Closed right hip fracture (HCC) 05/23/2023   Closed subcapital fracture of right femur (HCC) 05/21/2023   Closed right hip fracture, initial encounter (HCC) 05/20/2023   Normocytic anemia 05/20/2023   Bronchiectasis (HCC) 05/20/2023   First degree AV block 05/20/2023   Anxiety and depression 05/20/2023   BPH (benign prostatic hyperplasia) 05/20/2023   Severe depression (HCC) 06/09/2021   Generalized anxiety disorder 06/09/2021   History of psychosis 06/09/2021   Insomnia 09/11/2020    ONSET DATE: 05/30/2023  (referral date), 05/21/23 (date of R total hip arthroplasty)  REFERRING DIAG: S72.001A (ICD-10-CM) - Fracture of unspecified part of neck of right femur, initial  encounter for closed fracture   THERAPY DIAG:  Muscle weakness (generalized)  Unsteadiness on feet  Pain in right hip  Rationale for Evaluation and Treatment: Rehabilitation  SUBJECTIVE:   SUBJECTIVE STATEMENT: Pt reported things are okay.  Pt reported not completing HEP because I don't want to secondary to difficulties with motivation.  Pt accompanied by: self   PERTINENT HISTORY: s/p right total hip arthroplasty (05/21/23) to treat an impacted acute right hip femoral neck fracture after mechanical fall, chronic back pain, bronchiectasis, severe depression, anxiety, aortic atherosclerosis, iron deficiency, hyponatremia, leukocytosis  PRECAUTIONS: Fall  WEIGHT BEARING RESTRICTIONS: Yes    Per 05/23/23 PT Progress Notes: Post op he is WBAT on RLE with no hip precautions.   PAIN:  Are you having pain? Yes: NPRS scale: 3/10 Pain location: R hip and thigh Pain description: sore Aggravating factors: activity Relieving factors: rest  FALLS: Has patient fallen in last 6 months? Yes. Number of falls 3 Pt reported foot slipping when trying to get dressed for 2-3 of the falls.  Per 06/03/23 PA-C D/C Summary: Pt sustained a mechanical fall at home on 05/20/2023. Imaging significant for displaced right femoral neck fracture. No other injuries. Per 06/03/23 MD Progress Notes: s/p right total hip arthroplasty (05/21/23) to treat an impacted acute right hip femoral neck fracture  LIVING ENVIRONMENT: Lives with: lives with their family Lives in: House/apartment Stairs: Yes:  External: a couple steps; none (uses post as railing) Has following equipment at home: Vannie - 4 wheeled, Grab bars, and shower chair without back, walk-in shower and tub/shower  PLOF:  requires assistance with some BADLs/IADLs , does not drive  PATIENT GOALS: to be as independent as possible.  OBJECTIVE:  Note: Objective measures were completed at Evaluation unless otherwise noted.  HAND DOMINANCE:  Left  ADLs: Overall ADLs: requires some assistance Eating: ind Grooming: requires assistance for brushing teeth, shaving d/t long-term depression and tremors of BUE UB Dressing: ind, assistance required for buttons/zippers LB Dressing: requires assistance for pants/socks/shoes Toileting: requires assistance Bathing: requires assistance, currently sponge bathing Tub Shower transfers: not completing d/t pt sponge bathing Equipment: Shower seat without back, Grab bars, Walk in shower, and tub/shower  IADLs: Shopping: dependent Light housekeeping: dependent Meal Prep: dependent Community mobility: not currently driving Medication management: dependent Handwriting:  Pt reported difficulty with handwriting d/t resting hand tremors. Pt wrote x1 simple sentence with approx. 60% legibility. OT noted wavering line quality d/t tremors of BUE and decreased spacing between words.  MOBILITY STATUS: Independent, uses rollator walker for home and community mobility  POSTURE COMMENTS:  Ind seated posture  ACTIVITY TOLERANCE: Activity tolerance: more fatigued since hip surgery though pt reported I had already been having trouble.  FUNCTIONAL OUTCOME MEASURES: PSFS : 2.0    UPPER EXTREMITY ROM:    Active ROM Right eval Left eval  Shoulder flexion 105* 115*  Shoulder abduction 118* 118*  Shoulder adduction    Shoulder extension    Shoulder internal rotation    Shoulder external rotation    Elbow flexion [facility] WFL  Elbow extension [facility] WFL  Wrist flexion [facility] WFL  Wrist extension [facility] WFL  Wrist ulnar deviation impaired impaired  Wrist radial deviation impaired impaired  Wrist pronation [facility] WFL  Wrist supination WFL WFL  (Blank rows = not tested)  LUE: Pointer finger on LUE  - PIP slight hyperext Enlarged MCP joints of digits 1-5 PIP joint hyperext severe for digit 3-5 Pt able to achieve neutral ext (0*) of PIP joints of LUE with gentle PROM. Pt c/o slight discomfort if flexing  digits past neutral ext (0* ext).  RUE: Pinky finger on RUE - PIP slight hyperext Enlarged MCP joints of digits 1-5 PIP joint hyperext severe for digit 2-4 Pt able to achieve neutral ext (0*) of PIP joints of LUE with gentle PROM. Pt c/o slight discomfort if flexing digits past neutral ext (0* ext).  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 Pt reported hx of arthritis. Pt unable to make full composite fist secondary to PIP joint hyperext of BUE.   Pt able to oppose digit 5 to thumb of BUE.  Pt able to pinch with thumb and pointer finger and thumb and middle finger of BUE.   UPPER EXTREMITY MMT:     MMT Right eval Left eval  Shoulder flexion 4+ 4+  Shoulder abduction    Shoulder adduction    Shoulder extension    Shoulder internal rotation    Shoulder external rotation    Middle trapezius    Lower trapezius    Elbow flexion    Elbow extension    Wrist flexion    Wrist extension    Wrist ulnar deviation    Wrist radial deviation    Wrist pronation    Wrist supination    (Blank rows = not tested)  HAND FUNCTION: Grip and pinch strength not tested  d/t deformity of B PIP joints.  COORDINATION: 9 Hole Peg test: Right: 59 sec; Left: 53 sec  SENSATION: Pt reported mild tingling of B hands.  EDEMA: None noted.  MUSCLE TONE: RUE: Within functional limits and LUE: Within functional limits  COGNITION: Overall cognitive status: Within functional limits for tasks assessed  PERCEPTION: Not tested  PRAXIS: Not tested  OBSERVATIONS:  OT noted pt demo'd resting tremor of BUE, more pronounced of LUE.  OT noted hyperextension of BUE PIP joints with pt reporting hx of swan neck deformity. Pt demo'd increased difficulty with functional grip/pinch d/t BUE deformity though able to pick up pegs for 9-hole peg test indicating functional pinch.  Pt reported previously receiving hand therapy to address swan neck deformity at [name]. Pt reported  also seeing rheumatologist. Pt reported there wasn't much progress as a result of therapy and pt reported previously trying splinting in the past without much success.  Pt ambulated with A/E.                                                                                                                            TREATMENT DATE:   Self-Care OT educated pt on counseling resource d/t pt attending counseling session recently and motivation strategies for exercises. Pt acknowledged understanding.  OT created HEP schedule/checklist for pt, see pt instructions. Pt verbalized understanding though seemed hesitant about completing exercises. OT recommended to pt to try to complete exercises 2x per week in order to start building HEP into home routine. After continuing education on importance of consistent completion of HEP to improve carryover and improve overall outcomes, pt acknowledged understanding and stated We'll see.  OT educated pt on A/E options to increased ind for ADL tasks. Pt returned demonstration of using long-handled sponge. Pt acknowledged understanding of A/E options.  OT educated pt on incorporating BUE and participating in daily tasks in order to increase overall strengthening/endurance for daily activities. Pt acknowledged understanding.  TherAct:  Standing task incorporating BUE use by moving/tossing bean bags and blocks, stacking towers of blocks - standing with gait belt, SBA to supervision assist - to improve static standing balance, to improve attention to upright seated posture, to improve general endurance and strengthening, to improve functional use of BUE for daily tasks. Pt tolerated task well, demo'ing 5 to 10 minutes standing tolerance.    PATIENT EDUCATION: Education details: see today's treatment above Person educated: Patient and Spouse Education method: Explanation, Demonstration, Tactile cues, Verbal cues, and Handouts Education comprehension: verbalized  understanding, returned demonstration, and needs further education  HOME EXERCISE PROGRAM: 06/17/23 - Practice LB dressing with yellow theraband loop and actual pants/shoes using reacher and step stool 07/02/23 - red theraband, see pt instructions.   GOALS: Goals reviewed with patient? Yes  SHORT TERM GOALS: Target date: 07/05/23  Pt will be ind with initial general strengthening/endurance HEP Baseline: new to outpt OT 07/09/23 - Pt reported not completing HEP at home. OT provided pt  with HEP schedule/checklist. Goal status: in progress  2.  Pt will demo improved standing tolerance as evidenced by standing for at least 5 minutes while participating in functional task at tabletop with no more than supervision assist. Baseline: Pt reported decreased strength and endurance after R hip surgery. 07/09/23 - Pt demo'd static standing tolerance for 5 to 10 minutes during functional tasks today with SBA to supervision assist. Goal status: MET  3.  Pt will verbalize understanding of tremor reduction strategies to increase ind for handwriting and grooming ADL considerations. Baseline: new to outpt OT Goal status: INITIAL  4.  Pt will verbalize understanding of A/E options for bathing ADL tasks, including but not limited to transfer bench for tub shower, shower stool, long-handled sponge. Baseline: Pt currently participating in sponge-bathing with assistance to complete bathing ADL. Goal status: INITIAL  5.  Pt will verbalize understanding of A/E options for toileting ADL tasks, including but not limited to bidet, toilet aid, reacher for clothing management. Baseline: Assistance required 07/09/23 - OT educated pt on A/E options. Pt verbalized understanding. Goal status: in progress  6.  Pt will verbalize understanding of A/E options to improve ability to pick up items from floor.  Baseline: New to outpt OT. Pt reported moderate difficulty to pick up items from floor and reported PSFS individual  score for picking up items from floor as 1 out of 10. Goal status: in progress  LONG TERM GOALS: Target date: 08/02/23  Pt will be ind with general strengthening/endurance HEP Baseline: new to outpt OT Goal status: in progress  2.  Pt will demo understanding of safe LB dressing strategies using adaptive strategies and A/E PRN, including completion of LB dressing while seated. Baseline: Pt reported currently requiring assistance for LB dressing tasks. Pt reported hx of previous falls d/t attempting to dress while standing.  07/09/23 - Pt reported that dressing tasks are going well, completing LB dressing while seated. Pt reported understanding of reacher and step-stool though has not obtained items. Goal status: in progress  3. Patient will report at least two-point increase in average PSFS score or at least three-point increase in a single activity score indicating functionally significant improvement given minimum detectable change. Baseline: PSFS: 2.0 total score (See above for individual activity scores)  Goal status: in progress  ASSESSMENT:  CLINICAL IMPRESSION: Pt tolerated tasks well today, demo'ing improved standing tolerance. Based on pt report, OT noted limited carryover of A/E options and completion of HEP. Recommended to continue to educate on consistent completion of HEP and participation in functional tasks to increase ind and improve strength/endurance. Pt would benefit from skilled OT services in the outpatient setting to work on impairments as noted below, to increase ind for ADL tasks, to improve standing tolerance/endurance, and to improve understanding of adaptive strategies and A/E for ADL/IADL tasks.   PERFORMANCE DEFICITS: in functional skills including ADLs, IADLs, coordination, dexterity, proprioception, sensation, ROM, strength, pain, flexibility, Fine motor control, Gross motor control, mobility, balance, body mechanics, endurance, decreased knowledge of use of DME, and  UE functional use, cognitive skills including energy/drive, and psychosocial skills including environmental adaptation.   IMPAIRMENTS: are limiting patient from ADLs, IADLs, leisure, and social participation.   CO-MORBIDITIES: may have co-morbidities  that affects occupational performance. Patient will benefit from skilled OT to address above impairments and improve overall function.  MODIFICATION OR ASSISTANCE TO COMPLETE EVALUATION: Min-Moderate modification of tasks or assist with assess necessary to complete an evaluation.  OT OCCUPATIONAL PROFILE AND HISTORY: Problem  focused assessment: Including review of records relating to presenting problem.  CLINICAL DECISION MAKING: LOW - limited treatment options, no task modification necessary  REHAB POTENTIAL: Good  EVALUATION COMPLEXITY: Low    PLAN:  OT FREQUENCY: 1x/week  OT DURATION: 6 weeks (dates extended to allow for scheduling)  PLANNED INTERVENTIONS: 97168 OT Re-evaluation, 97535 self care/ADL training, 02889 therapeutic exercise, 97530 therapeutic activity, 97112 neuromuscular re-education, 97140 manual therapy, 97760 Orthotics management and training, 02239 Splinting (initial encounter), [phone] Subsequent splinting/medication, passive range of motion, functional mobility training, energy conservation, patient/family education, and DME and/or AE instructions  RECOMMENDED OTHER SERVICES: PT eval completed  CONSULTED AND AGREED WITH PLAN OF CARE: Patient and family member/caregiver  PLAN FOR NEXT SESSION:   Review HEP PRN  Assess goals - likely re-cert  Tremor reduction strategies  Tasks with standing tolerance  Review LB dressing adaptive strategies to increase ind PRN - reacher and step stool   Geofm FORBES Coder, OT 07/09/2023, 3:42 PM

## 2023-07-09 NOTE — Patient Instructions (Addendum)
 Jeremy [name]

## 2023-07-11 ENCOUNTER — Ambulatory Visit: Payer: Medicare Other | Admitting: Physical Therapy

## 2023-07-11 DIAGNOSIS — M6281 Muscle weakness (generalized): Secondary | ICD-10-CM

## 2023-07-11 DIAGNOSIS — R2681 Unsteadiness on feet: Secondary | ICD-10-CM

## 2023-07-11 DIAGNOSIS — R2689 Other abnormalities of gait and mobility: Secondary | ICD-10-CM

## 2023-07-11 DIAGNOSIS — M25551 Pain in right hip: Secondary | ICD-10-CM

## 2023-07-11 DIAGNOSIS — S72001S Fracture of unspecified part of neck of right femur, sequela: Secondary | ICD-10-CM | POA: Diagnosis not present

## 2023-07-11 NOTE — Therapy (Signed)
 OUTPATIENT PHYSICAL THERAPY LOWER EXTREMITY TREATMENT   Patient Name: Jeremy [name] MRN: [id number] DOB:Nov 13, 1935, 88 y.o., male Today's Date: 07/11/2023  END OF SESSION:  PT End of Session - 07/11/23 1412     Visit Number 9    Number of Visits 13   with eval   Date for PT Re-Evaluation 08/02/23    Authorization Type Medicare    Progress Note Due on Visit 10    PT Start Time 1404    PT Stop Time 1442    PT Time Calculation (min) 38 min    Equipment Utilized During Treatment --    Activity Tolerance Patient tolerated treatment well    Behavior During Therapy Flat affect                  Past Medical History:  Diagnosis Date   Aortic atherosclerosis (HCC)    BPH (benign prostatic hyperplasia)    Bronchiectasis (HCC)    Depression    Depression    GAD (generalized anxiety disorder)    Past Surgical History:  Procedure Laterality Date   APPENDECTOMY     HERNIA REPAIR     TOTAL HIP ARTHROPLASTY Right 05/21/2023   Procedure: RIGHT TOTAL HIP ARTHROPLASTY;  Surgeon: Vernetta Lonni GRADE, MD;  Location: [facility] OR;  Service: Orthopedics;  Laterality: Right;   Patient Active Problem List   Diagnosis Date Noted   Malnutrition of moderate degree 05/23/2023   Closed right hip fracture (HCC) 05/23/2023   Closed subcapital fracture of right femur (HCC) 05/21/2023   Closed right hip fracture, initial encounter (HCC) 05/20/2023   Normocytic anemia 05/20/2023   Bronchiectasis (HCC) 05/20/2023   First degree AV block 05/20/2023   Anxiety and depression 05/20/2023   BPH (benign prostatic hyperplasia) 05/20/2023   Severe depression (HCC) 06/09/2021   Generalized anxiety disorder 06/09/2021   History of psychosis 06/09/2021   Insomnia 09/11/2020    PCP: Gerome Brunet, DO  REFERRING PROVIDER: Pegge Toribio PARAS, PA-C  REFERRING DIAG: S72.001A (ICD-10-CM) - Fracture of unspecified part of neck of right femur, initial encounter for closed fracture  THERAPY DIAG:  Muscle  weakness (generalized)  Unsteadiness on feet  Pain in right hip  Other abnormalities of gait and mobility  Rationale for Evaluation and Treatment: Rehabilitation  ONSET DATE: 05/30/2023 (referral date)  SUBJECTIVE:   SUBJECTIVE STATEMENT: Pt presents w/rollator. Reports he did some of his homework yesterday. No falls, hip is a bit more bothersome today.   Wife: [name] (present for beginning of session and then waited in lobby)   PERTINENT HISTORY: PMH: BPH, bronchiectasis, severe depression, anxiety and aortic atherosclerosis  PAIN:  Are you having pain? Yes: NPRS scale: 2/10 Pain location: R hip  Pain description: Achy/throb   PRECAUTIONS: Fall  RED FLAGS: None   WEIGHT BEARING RESTRICTIONS: Yes WBAT RLE  FALLS:  Has patient fallen in last 6 months? Yes. Number of falls 3 in the past 6 months, feel like legs slip out from under him; usually needs help to get back up  LIVING ENVIRONMENT: Lives with: lives with their spouse Lives in: House/apartment Stairs: Yes: External: 3-4 steps; has a post to hold onto Has following equipment at home: Vannie - 4 wheeled, shower chair, and Grab bars  OCCUPATION: retired  PLOF: Independent with gait, Independent with transfers, and Requires assistive device for independence  PATIENT GOALS: walk independently  NEXT MD VISIT: sees hip surgeon in 4 weeks (06/30/22)  OBJECTIVE:  Note: Objective measures were completed at Evaluation  unless otherwise noted.  DIAGNOSTIC FINDINGS:  R hip xray prior to surgery 05/20/23 FINDINGS: SI joints are non widened. Pubic symphysis and rami appear intact. Acute right femoral neck fracture. No femoral head dislocation   IMPRESSION: Acute right femoral neck fracture.  COGNITION: Overall cognitive status: Difficulty to assess due to: flat affect       POSTURE: rounded shoulders, forward head, and posterior pelvic tilt   LOWER EXTREMITY ROM:  Active ROM Right eval Left eval  Hip  flexion Decreased due to pain   Hip extension    Hip abduction    Hip adduction    Hip internal rotation    Hip external rotation    Knee flexion    Knee extension    Ankle dorsiflexion    Ankle plantarflexion    Ankle inversion    Ankle eversion     (Blank rows = not tested)  LOWER EXTREMITY MMT:  MMT Right eval Left eval  Hip flexion 4 5  Hip extension    Hip abduction    Hip adduction    Hip internal rotation    Hip external rotation    Knee flexion 4 5  Knee extension 4 5  Ankle dorsiflexion 5 5  Ankle plantarflexion    Ankle inversion    Ankle eversion     (Blank rows = not tested)                                                                                                                                 TREATMENT: Self-care/home management  Discussed POC moving forward, as wife inquiring about how to add more PT visits at end of POC. Informed wife that pt is doing very well balance-wise, so unsure if pt will need more PT. Will plan to assess FGA and as part of 10th visit PN next session to further evaluate endurance and dynamic walking balance, as pt no longer using rollator at home and wife is concerned regarding pt's stamina. Will update goals and POC if appropriate, but if not, pt will DC at end of POC. Pt and wife verbalized understanding.  Discussed potential for walking program for pt at home (Starting at 3-5 minutes/day and building up to 30). Pt reports this is unrealistic and he will not do it. Pt states he lays in bed all day, only gets up for meals and bathroom breaks. In conjunction w/pt, therapist set new goal for pt to get up out of bed 3x/day and walk to the closet/bedroom door/etc. In hopes of slowly adding in more time on feet. Pt states that this will be a challenge but he thinks he can do it, as he is getting to the point where lying in bed is no longer appealing.   Ther Act Per pt request, SciFit multi-peaks level 6 for 8 minutes using  BUE/BLEs for neural priming for reciprocal movement, dynamic cardiovascular warmup and increased amplitude of stepping.  Pt reports he enjoys riding the bike and it makes him feel productive.    PATIENT EDUCATION:  Education details: continue to work towards 2 days a week for [employer], walking program, see self-care section, plan to DC at end of POC.  Person educated: Patient and Spouse Education method: Explanation Education comprehension: verbalized understanding and needs further education  HOME EXERCISE PROGRAM: Access Code: [email or web address] URL: [email or web address] Date: 06/13/2023 Prepared by: Marlon Kathye Cipriani  Exercises - Side Stepping with Resistance at Thighs and Counter Support  - 1 x daily - 7 x weekly - 3 sets - 10 reps - Forward Backward Monster Walk with Band at [employer] and Counter Support  - 1 x daily - 7 x weekly - 3 sets - 10 reps - Bird Dog  - 1 x daily - 7 x weekly - 2 sets - 10 reps - Squat with Chair Touch  - 1 x daily - 7 x weekly - 2 sets - 10 reps  *challenged pt to get up out of bed 3x/day every day (not including bathroom breaks or meals) to work on stamina. Pt in agreement with this (2/6)    ASSESSMENT:  CLINICAL IMPRESSION: Emphasis of skilled PT session on family education regarding PT POC, endurance and progressing HEP. Pt's wife requesting more PT visits at beginning of session, but due to pt's significant progress, unsure if pt will need this. Will perform more challenging balance and endurance assessments next session for progress note as pt has hit ceiling effect for Berg and update POC as appropriate. Pt in agreement to work on getting out of bed 3x/day to work on endurance, see self-care section for details.    Continue POC.    OBJECTIVE IMPAIRMENTS: Abnormal gait, decreased balance, decreased knowledge of use of DME, decreased mobility, decreased ROM, decreased strength, impaired UE functional use, postural dysfunction, and pain.    ACTIVITY LIMITATIONS: carrying, lifting, bending, standing, stairs, transfers, and bed mobility  PARTICIPATION LIMITATIONS: meal prep, cleaning, driving, and community activity  PERSONAL FACTORS: Age and 1-2 comorbidities:    BPH, bronchiectasis, severe depression, anxiety and aortic atherosclerosisare also affecting patient's functional outcome.   REHAB POTENTIAL: Good  CLINICAL DECISION MAKING: Stable/uncomplicated  EVALUATION COMPLEXITY: Low   GOALS: Goals reviewed with patient? Yes  SHORT TERM GOALS: Target date: 06/28/2023   Pt will be independent with initial HEP for improved strength, balance, transfers and gait. Baseline: not compliant (1/28) Goal status: NOT MET  2.  Pt will improve 5 x STS to less than or equal to 22 seconds to demonstrate improved functional strength and transfer efficiency.  Baseline: 26.18 sec no UE (1/3); 18.56 sec no UE (1/28) Goal status: MET  3.  Pt will improve gait velocity to at least 3.25 ft/sec for improved gait efficiency and performance at mod I level  Baseline: 2.92 ft/sec with rollator (1/3); 3.54 ft/sec w/ rollator (1/28) Goal status: MET  4.  Pt will improve normal TUG to less than or equal to 19 seconds for improved functional mobility and decreased fall risk. Baseline: 22.62 sec with rollator (1/3); 16.09 sec w/ rollator (1/28) Goal status: MET  5.  Pt will improve Berg score to 37/56 for decreased fall risk Baseline: 33/56 (1/3), 53/56 (1/30) Goal status: MET   LONG TERM GOALS: Target date: 07/19/2023    Pt will be independent with final HEP for improved strength, balance, transfers and gait. Baseline:  Goal status: INITIAL  2.  Pt will improve 5 x STS to less than  or equal to 18 seconds to demonstrate improved functional strength and transfer efficiency.  Baseline: 26.18 sec no UE (1/3) Goal status: INITIAL  3.  Pt will improve gait velocity to at least 3.75 ft/sec for improved gait efficiency and performance at  mod I level  Baseline: 2.92 ft/sec with rollator (1/3); 3.54 ft/sec w/ rollator (1/28) Goal status: REVISED  4.  Pt will improve normal TUG to less than or equal to 13 seconds for improved functional mobility and decreased fall risk. Baseline: 22.62 sec with rollator (1/3); 16.09 sec w/ rollator (1/28) Goal status: REVISED  5.  Pt will improve Berg score to 41/56 for decreased fall risk Baseline: 33/56 (1/3), 53/56 (1/30) Goal status: MET     PLAN:  PT FREQUENCY: 2x/week  PT DURATION: 6 weeks  PLANNED INTERVENTIONS: 02835- PT Re-evaluation, 97110-Therapeutic exercises, 97530- Therapeutic activity, 97112- Neuromuscular re-education, 97535- Self Care, [id number]- Manual therapy, [phone]- Gait training, Patient/Family education, Balance training, Stair training, Taping, Dry Needling, Scar mobilization, DME instructions, Cryotherapy, and Moist heat  PLAN FOR NEXT SESSION: 10th visit PN: assess FGA and . pt goal is to get away from rollator (signed him off to do this at home with no AD, still use rollator for community mobility); add to HEP to work on functional strengthening and balance (sit to stands, balance with decreased UE support), lifting and functional strength , progress deadlift height, standing to taps, tilt board, forward bend to cone tap, step out slam balls, leg press?  Foam beam - ball toss, tandem, heel raise; reactive balance - perturbations in // bars, resisted STS > slam ball; work on increasing step length, step height, LE clearance, SLS; gait outdoors across various surfaces, curbs, etc.   Nikolas Casher E Nesreen Albano, PT, DPT  07/11/2023, 2:47 PM

## 2023-07-15 ENCOUNTER — Ambulatory Visit: Payer: Medicare Other | Admitting: Physical Therapy

## 2023-07-15 ENCOUNTER — Ambulatory Visit: Payer: Medicare Other | Admitting: Occupational Therapy

## 2023-07-15 DIAGNOSIS — R2681 Unsteadiness on feet: Secondary | ICD-10-CM

## 2023-07-15 DIAGNOSIS — R2689 Other abnormalities of gait and mobility: Secondary | ICD-10-CM | POA: Diagnosis not present

## 2023-07-15 DIAGNOSIS — M25551 Pain in right hip: Secondary | ICD-10-CM

## 2023-07-15 DIAGNOSIS — M6281 Muscle weakness (generalized): Secondary | ICD-10-CM

## 2023-07-15 DIAGNOSIS — S72001S Fracture of unspecified part of neck of right femur, sequela: Secondary | ICD-10-CM | POA: Diagnosis not present

## 2023-07-15 NOTE — Therapy (Signed)
OUTPATIENT PHYSICAL THERAPY LOWER EXTREMITY TREATMENT - 10th VISIT PROGRESS NOTE   Patient Name: Jeremy [name] MRN: [id number] DOB:November 05, 1935, 88 y.o., male Today's Date: 07/15/2023  Physical Therapy Progress Note   Dates of Reporting Period: 06/07/2023-07/15/2023  See Note below for Objective Data and Assessment of Progress/Goals.  Thank you for the referral of this patient. Peter Congo, PT, DPT, CSRS    END OF SESSION:  PT End of Session - 07/15/23 1404     Visit Number 10    Number of Visits 13   with eval   Date for PT Re-Evaluation 08/02/23    Authorization Type Medicare    Progress Note Due on Visit 10    PT Start Time 1404    PT Stop Time 1443    PT Time Calculation (min) 39 min    Equipment Utilized During Treatment Gait belt    Activity Tolerance Patient tolerated treatment well    Behavior During Therapy Flat affect                   Past Medical History:  Diagnosis Date   Aortic atherosclerosis (HCC)    BPH (benign prostatic hyperplasia)    Bronchiectasis (HCC)    Depression    Depression    GAD (generalized anxiety disorder)    Past Surgical History:  Procedure Laterality Date   APPENDECTOMY     HERNIA REPAIR     TOTAL HIP ARTHROPLASTY Right 05/21/2023   Procedure: RIGHT TOTAL HIP ARTHROPLASTY;  Surgeon: Kathryne Hitch, MD;  Location: [facility] OR;  Service: Orthopedics;  Laterality: Right;   Patient Active Problem List   Diagnosis Date Noted   Malnutrition of moderate degree 05/23/2023   Closed right hip fracture (HCC) 05/23/2023   Closed subcapital fracture of right femur (HCC) 05/21/2023   Closed right hip fracture, initial encounter (HCC) 05/20/2023   Normocytic anemia 05/20/2023   Bronchiectasis (HCC) 05/20/2023   First degree AV block 05/20/2023   Anxiety and depression 05/20/2023   BPH (benign prostatic hyperplasia) 05/20/2023   Severe depression (HCC) 06/09/2021   Generalized anxiety disorder 06/09/2021   History of  psychosis 06/09/2021   Insomnia 09/11/2020    PCP: Irena Reichmann, DO  REFERRING PROVIDER: Charlton Amor, PA-C  REFERRING DIAG: S72.001A (ICD-10-CM) - Fracture of unspecified part of neck of right femur, initial encounter for closed fracture  THERAPY DIAG:  Muscle weakness (generalized)  Unsteadiness on feet  Pain in right hip  Closed fracture of right hip, sequela  Other abnormalities of gait and mobility  Rationale for Evaluation and Treatment: Rehabilitation  ONSET DATE: 05/30/2023 (referral date)  SUBJECTIVE:   SUBJECTIVE STATEMENT: Pt has "thought" about the plan to get out of bed that was discussed last session but has not started it yet, denies any significant pain today. Pt does report ongoing tightness in his R hip.  Wife: [name] in lobby  PERTINENT HISTORY: PMH: BPH, bronchiectasis, severe depression, anxiety and aortic atherosclerosis  PAIN:  Are you having pain? No  PRECAUTIONS: Fall  RED FLAGS: None   WEIGHT BEARING RESTRICTIONS: Yes WBAT RLE  FALLS:  Has patient fallen in last 6 months? Yes. Number of falls 3 in the past 6 months, feel like legs slip out from under him; usually needs help to get back up  LIVING ENVIRONMENT: Lives with: lives with their spouse Lives in: House/apartment Stairs: Yes: External: 3-4 steps; has a post to hold onto Has following equipment at home: Dan Humphreys - 4 wheeled, shower  chair, and Grab bars  OCCUPATION: retired  PLOF: Independent with gait, Independent with transfers, and Requires assistive device for independence  PATIENT GOALS: "walk independently"  NEXT MD VISIT: sees hip surgeon in 4 weeks (06/30/22)  OBJECTIVE:  Note: Objective measures were completed at Evaluation unless otherwise noted.  DIAGNOSTIC FINDINGS:  R hip xray prior to surgery 05/20/23 FINDINGS: SI joints are non widened. Pubic symphysis and rami appear intact. Acute right femoral neck fracture. No femoral head dislocation    IMPRESSION: Acute right femoral neck fracture.  COGNITION: Overall cognitive status: Difficulty to assess due to: flat affect       POSTURE: rounded shoulders, forward head, and posterior pelvic tilt   LOWER EXTREMITY ROM:  Active ROM Right eval Left eval  Hip flexion Decreased due to pain   Hip extension    Hip abduction    Hip adduction    Hip internal rotation    Hip external rotation    Knee flexion    Knee extension    Ankle dorsiflexion    Ankle plantarflexion    Ankle inversion    Ankle eversion     (Blank rows = not tested)  LOWER EXTREMITY MMT:  MMT Right eval Left eval  Hip flexion 4 5  Hip extension    Hip abduction    Hip adduction    Hip internal rotation    Hip external rotation    Knee flexion 4 5  Knee extension 4 5  Ankle dorsiflexion 5 5  Ankle plantarflexion    Ankle inversion    Ankle eversion     (Blank rows = not tested)                                                                                                                                 TREATMENT:  Ther Act  [facility] PT Assessment - 07/15/23 1417       6 minute walk test results    Aerobic Endurance Distance Walked 967    Endurance additional comments 2/10 RPE, SpO2 99%      Functional Gait  Assessment   Gait assessed  Yes    Gait Level Surface Walks 20 ft in less than 7 sec but greater than 5.5 sec, uses assistive device, slower speed, mild gait deviations, or deviates 6-10 in outside of the 12 in walkway width.    Change in Gait Speed Able to change speed, demonstrates mild gait deviations, deviates 6-10 in outside of the 12 in walkway width, or no gait deviations, unable to achieve a major change in velocity, or uses a change in velocity, or uses an assistive device.    Gait with Horizontal Head Turns Performs head turns with moderate changes in gait velocity, slows down, deviates 10-15 in outside 12 in walkway width but recovers, can continue to walk.    Gait with  Vertical Head Turns Performs task with moderate change in gait velocity,  slows down, deviates 10-15 in outside 12 in walkway width but recovers, can continue to walk.    Gait and Pivot Turn Pivot turns safely in greater than 3 sec and stops with no loss of balance, or pivot turns safely within 3 sec and stops with mild imbalance, requires small steps to catch balance.    Step Over Obstacle Is able to step over 2 stacked shoe boxes taped together (9 in total height) without changing gait speed. No evidence of imbalance.    Gait with Narrow Base of Support Ambulates 7-9 steps.    Gait with Eyes Closed Walks 20 ft, slow speed, abnormal gait pattern, evidence for imbalance, deviates 10-15 in outside 12 in walkway width. Requires more than 9 sec to ambulate 20 ft.    Ambulating Backwards Walks 20 ft, uses assistive device, slower speed, mild gait deviations, deviates 6-10 in outside 12 in walkway width.    Steps Alternating feet, must use rail.    Total Score 18    FGA comment: 18/30, high fall risk            To address pt complaints of R hip tightness: Supine modified Thomas stretch 3 x 60 sec each Added to HEP, see bolded below   Self-Care/Home Management  Discussed POC moving forward, could justify recertification of PT services at end of this POC due to patient exhibiting impaired endurance based on distance covered during (967 ft) and impaired balance/increased fall risk based on score of 18/30 on the FGA. Patient to consider if he wants to add visits at end of this POC and discuss with his wife, will update this therapist next session on his decision.   PATIENT EDUCATION:  Education details: results of OM and functional implications and justification for continued PT services if pt agreeable, pt and wife to discuss if he wants to add visits and let this therapist know next visit, continue working on HEP of trying to get out of bed several times per day Person educated:  Patient Education method: Explanation, Demonstration, Verbal cues, and Handouts Education comprehension: verbalized understanding, returned demonstration, and needs further education  HOME EXERCISE PROGRAM: Access Code: [email or web address] URL: [email or web address] Date: 06/13/2023 Prepared by: Alethia Berthold Plaster  Exercises - Side Stepping with Resistance at Thighs and Counter Support  - 1 x daily - 7 x weekly - 3 sets - 10 reps - Forward Backward Monster Walk with Band at [employer] and Counter Support  - 1 x daily - 7 x weekly - 3 sets - 10 reps - Bird Dog  - 1 x daily - 7 x weekly - 2 sets - 10 reps - Squat with Chair Touch  - 1 x daily - 7 x weekly - 2 sets - 10 reps - Modified Thomas Stretch  - 1 x daily - 7 x weekly - 1 sets - 3-5 reps - 30 sec hold  *challenged pt to get up out of bed 3x/day every day (not including bathroom breaks or meals) to work on stamina. Pt in agreement with this (2/6)    ASSESSMENT:  CLINICAL IMPRESSION: Emphasis of skilled PT session on assessing endurance and balance via and FGA as well as addressing patient complaints of R hip tightness. Pt does exhibit impaired endurance based on distance covered during (967 ft) as compared to age-related norms of 1368 ft as well as impaired balance and increased fall risk based on score on FGA of 18/30. Patient could benefit from continued skilled PT services  in order to improve his endurance, improve his balance, and decrease his fall risk if he is agreeable. Will discuss PT POC with patient next visit. Continue POC.    OBJECTIVE IMPAIRMENTS: Abnormal gait, decreased balance, decreased knowledge of use of DME, decreased mobility, decreased ROM, decreased strength, impaired UE functional use, postural dysfunction, and pain.   ACTIVITY LIMITATIONS: carrying, lifting, bending, standing, stairs, transfers, and bed mobility  PARTICIPATION LIMITATIONS: meal prep, cleaning, driving, and community  activity  PERSONAL FACTORS: Age and 1-2 comorbidities:    BPH, bronchiectasis, severe depression, anxiety and aortic atherosclerosisare also affecting patient's functional outcome.   REHAB POTENTIAL: Good  CLINICAL DECISION MAKING: Stable/uncomplicated  EVALUATION COMPLEXITY: Low   GOALS: Goals reviewed with patient? Yes  SHORT TERM GOALS: Target date: 06/28/2023   Pt will be independent with initial HEP for improved strength, balance, transfers and gait. Baseline: not compliant (1/28) Goal status: NOT MET  2.  Pt will improve 5 x STS to less than or equal to 22 seconds to demonstrate improved functional strength and transfer efficiency.  Baseline: 26.18 sec no UE (1/3); 18.56 sec no UE (1/28) Goal status: MET  3.  Pt will improve gait velocity to at least 3.25 ft/sec for improved gait efficiency and performance at mod I level  Baseline: 2.92 ft/sec with rollator (1/3); 3.54 ft/sec w/ rollator (1/28) Goal status: MET  4.  Pt will improve normal TUG to less than or equal to 19 seconds for improved functional mobility and decreased fall risk. Baseline: 22.62 sec with rollator (1/3); 16.09 sec w/ rollator (1/28) Goal status: MET  5.  Pt will improve Berg score to 37/56 for decreased fall risk Baseline: 33/56 (1/3), 53/56 (1/30) Goal status: MET   LONG TERM GOALS: Target date: 07/19/2023    Pt will be independent with final HEP for improved strength, balance, transfers and gait. Baseline:  Goal status: INITIAL  2.  Pt will improve 5 x STS to less than or equal to 18 seconds to demonstrate improved functional strength and transfer efficiency.  Baseline: 26.18 sec no UE (1/3) Goal status: INITIAL  3.  Pt will improve gait velocity to at least 3.75 ft/sec for improved gait efficiency and performance at mod I level  Baseline: 2.92 ft/sec with rollator (1/3); 3.54 ft/sec w/ rollator (1/28) Goal status: REVISED  4.  Pt will improve normal TUG to less than or equal to 13  seconds for improved functional mobility and decreased fall risk. Baseline: 22.62 sec with rollator (1/3); 16.09 sec w/ rollator (1/28) Goal status: REVISED  5.  Pt will improve Berg score to 41/56 for decreased fall risk Baseline: 33/56 (1/3), 53/56 (1/30) Goal status: MET     PLAN:  PT FREQUENCY: 2x/week  PT DURATION: 6 weeks  PLANNED INTERVENTIONS: 97164- PT Re-evaluation, 97110-Therapeutic exercises, 97530- Therapeutic activity, 97112- Neuromuscular re-education, 97535- Self Care, [id number]- Manual therapy, [phone]- Gait training, Patient/Family education, Balance training, Stair training, Taping, Dry Needling, Scar mobilization, DME instructions, Cryotherapy, and Moist heat  PLAN FOR NEXT SESSION: d/c vs add visits 1x/week for 4 weeks. pt goal is to get away from rollator (signed him off to do this at home with no AD, still use rollator for community mobility); add to HEP to work on functional strengthening and balance (sit to stands, balance with decreased UE support), lifting and functional strength , progress deadlift height, standing to taps, tilt board, forward bend to cone tap, step out slam balls, leg press?  Foam beam - ball toss, tandem,  heel raise; reactive balance - perturbations in // bars, resisted STS > slam ball; work on increasing step length, step height, LE clearance, SLS; gait outdoors across various surfaces, curbs, etc.; endurance, balance (head turns, backwards, EC, obstacle navigation)   Peter Congo, PT Peter Congo, PT, DPT, CSRS   07/15/2023, 2:43 PM

## 2023-07-15 NOTE — Therapy (Signed)
 OUTPATIENT OCCUPATIONAL THERAPY NEURO Treatment  Patient Name: Jeremy [name] MRN: [id number] DOB:[birth year], 88 y.o., male Today's Date: 07/15/2023  PCP: Pete Brand, DO  REFERRING PROVIDER: Sterling Eisenmenger, PA-C   OCCUPATIONAL THERAPY DISCHARGE SUMMARY  Visits from Start of Care: 6  Current functional level related to goals / functional outcomes: Pt has met 100% STG and 2 out of 3 LTG to satisfactory levels.  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 Per discussion at 06/07/23 initial OT eval and per discussion today, pt politely declined options to potentially address BUE.   Pt continuing to attend PT sessions for hip fx referring dx, see PT notes for additional details.  Education / Equipment: Pt has all needed materials and education. Pt understands how to continue on with self-management. See tx notes for more details. OT recommended several A/E options throughout POC to improve ADL/IADL participation. Pt returned demonstration of using several A/E items during sessions and ind recalled purpose of several A/E options. However, pt reported he has not yet obtained most A/E options at time of OT D/C. Per pt, pt is considering obtaining long-handled sponge, reacher, weighted eating utensils, and weighted pen.   Patient goals were partially met. Patient is being discharged due to pt request and pt is pleased with current functional level as pertains to OT.  END OF SESSION:  OT End of Session - 07/15/23 1633     Visit Number 6    Number of Visits 7   including eval   Date for OT Re-Evaluation 08/02/23    Authorization Type Medicare/[employer]BS/[employer]    Progress Note Due on Visit 10    OT Start Time 1318    OT Stop Time 1406    OT Time Calculation (min) 48 min    Activity Tolerance Patient tolerated treatment well    Behavior During Therapy Flat affect                   Past Medical History:  Diagnosis Date   Aortic atherosclerosis (HCC)    BPH (benign prostatic hyperplasia)    Bronchiectasis (HCC)    Depression    Depression    GAD (generalized anxiety disorder)    Past Surgical History:  Procedure Laterality Date   APPENDECTOMY     HERNIA REPAIR     TOTAL HIP ARTHROPLASTY Right 05/21/2023   Procedure: RIGHT TOTAL HIP ARTHROPLASTY;  Surgeon: Arnie Lao, MD;  Location: [facility] OR;  Service: Orthopedics;  Laterality: Right;   Patient Active Problem List   Diagnosis Date Noted   Malnutrition of moderate degree 05/23/2023   Closed right hip fracture (HCC) 05/23/2023   Closed subcapital fracture of right femur (HCC) 05/21/2023   Closed right hip fracture, initial encounter (HCC) 05/20/2023   Normocytic anemia 05/20/2023   Bronchiectasis (HCC) 05/20/2023   First degree AV block 05/20/2023   Anxiety and depression 05/20/2023   BPH (benign prostatic hyperplasia) 05/20/2023   Severe depression (HCC) 06/09/2021   Generalized anxiety disorder 06/09/2021   History of psychosis 06/09/2021   Insomnia 09/11/2020    ONSET DATE: 05/30/2023  (referral date), 05/21/23 (date of R total hip arthroplasty)  REFERRING DIAG: S72.001A (ICD-10-CM) - Fracture of unspecified part of neck of right femur, initial encounter for closed fracture   THERAPY DIAG:  Muscle weakness (generalized)  Unsteadiness on feet  Pain in right hip  Rationale for Evaluation and  Treatment: Rehabilitation  SUBJECTIVE:   SUBJECTIVE STATEMENT: Pt reported completing all exercises on Wednesday and theraband exercises on Saturday last week. Pt reported exercises went well. Pt confirmed no questions/concerns.  Pt intentionally left walker at home today, therefore not using walker today.  Pt accompanied by: self and spouse (in lobby)  PERTINENT HISTORY: s/p right total hip arthroplasty (05/21/23) to treat an impacted acute right hip femoral neck  fracture after mechanical fall, chronic back pain, bronchiectasis, severe depression, anxiety, aortic atherosclerosis, iron deficiency, hyponatremia, leukocytosis  PRECAUTIONS: Fall  WEIGHT BEARING RESTRICTIONS: Yes    Per 05/23/23 PT Progress Notes: Post op he is WBAT on RLE with no hip precautions.   PAIN:  Are you having pain? Yes: NPRS scale: 2/10 Pain location: R hip Pain description: sore Aggravating factors: hip flexion Relieving factors: rest  FALLS: Has patient fallen in last 6 months? Yes. Number of falls 3 Pt reported foot slipping when trying to get dressed for 2-3 of the falls.  Per 06/03/23 PA-C D/C Summary: Pt "sustained a mechanical fall at home on 05/20/2023. Imaging significant for displaced right femoral neck fracture. No other injuries." Per 06/03/23 MD Progress Notes: s/p right total hip arthroplasty (05/21/23) to treat an impacted acute right hip femoral neck fracture  LIVING ENVIRONMENT: Lives with: lives with their family Lives in: House/apartment Stairs: Yes: External: "a couple" steps; none (uses "post" as railing) Has following equipment at home: Walker - 4 wheeled, Grab bars, and shower chair without back, walk-in shower and tub/shower  PLOF:  requires assistance with some BADLs/IADLs , does not drive  PATIENT GOALS: "to be as independent as possible."  OBJECTIVE:  Note: Objective measures were completed at Evaluation unless otherwise noted.  HAND DOMINANCE: Left  ADLs: Overall ADLs: requires some assistance Eating: ind Grooming: requires assistance for brushing teeth, shaving d/t "long-term depression" and "tremors" of BUE UB Dressing: ind, assistance required for buttons/zippers LB Dressing: requires assistance for pants/socks/shoes Toileting: requires assistance Bathing: requires assistance, currently sponge bathing Tub Shower transfers: not completing d/t pt sponge bathing Equipment: Shower seat without back, Grab bars, Walk in shower, and  tub/shower  IADLs: Shopping: dependent Light housekeeping: dependent Meal Prep: dependent Community mobility: not currently driving Medication management: dependent Handwriting:  Pt reported difficulty with handwriting d/t resting hand tremors. Pt wrote x1 simple sentence with approx. 60% legibility. OT noted wavering line quality d/t tremors of BUE and decreased spacing between words.  MOBILITY STATUS: Independent, uses rollator walker for home and community mobility  POSTURE COMMENTS:  Ind seated posture  ACTIVITY TOLERANCE: Activity tolerance: more fatigued since hip surgery though pt reported "I had already been having trouble."  FUNCTIONAL OUTCOME MEASURES: PSFS : 2.0   PSFS: 3.7   UPPER EXTREMITY ROM:    Active ROM Right eval Left eval  Shoulder flexion 105* 115*  Shoulder abduction 118* 118*  Shoulder adduction    Shoulder extension    Shoulder internal rotation    Shoulder external rotation    Elbow flexion [facility] WFL  Elbow extension [facility] WFL  Wrist flexion [facility] WFL  Wrist extension [facility] WFL  Wrist ulnar deviation impaired impaired  Wrist radial deviation impaired impaired  Wrist pronation [facility] WFL  Wrist supination WFL WFL  (Blank rows = not tested)  LUE: Pointer finger on LUE  - PIP slight hyperext Enlarged MCP joints of digits 1-5 PIP joint hyperext severe for digit 3-5 Pt able to achieve neutral ext (0*) of PIP joints of LUE with gentle PROM. Pt  c/o slight discomfort if flexing digits past neutral ext (0* ext).  RUE: Pinky finger on RUE - PIP slight hyperext Enlarged MCP joints of digits 1-5 PIP joint hyperext severe for digit 2-4 Pt able to achieve neutral ext (0*) of PIP joints of LUE with gentle PROM. Pt c/o slight discomfort if flexing digits past neutral ext (0* ext).  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 1.5 years ago. Pt reported hx of arthritis. Pt unable to make full composite fist secondary to PIP joint hyperext of BUE.   Pt  able to oppose digit 5 to thumb of BUE.  Pt able to pinch with thumb and pointer finger and thumb and middle finger of BUE.   UPPER EXTREMITY MMT:     MMT Right eval Left eval  Shoulder flexion 4+ 4+  Shoulder abduction    Shoulder adduction    Shoulder extension    Shoulder internal rotation    Shoulder external rotation    Middle trapezius    Lower trapezius    Elbow flexion    Elbow extension    Wrist flexion    Wrist extension    Wrist ulnar deviation    Wrist radial deviation    Wrist pronation    Wrist supination    (Blank rows = not tested)  HAND FUNCTION: Grip and pinch strength not tested d/t deformity of B PIP joints.  COORDINATION: 9 Hole Peg test: Right: 59 sec; Left: 53 sec  SENSATION: Pt reported mild tingling of B hands.  EDEMA: None noted.  MUSCLE TONE: RUE: Within functional limits and LUE: Within functional limits  COGNITION: Overall cognitive status: Within functional limits for tasks assessed  PERCEPTION: Not tested  PRAXIS: Not tested  OBSERVATIONS:  OT noted pt demo'd resting tremor of BUE, more pronounced of LUE.  OT noted hyperextension of BUE PIP joints with pt reporting hx of "swan neck deformity." Pt demo'd increased difficulty with functional grip/pinch d/t BUE deformity though able to pick up pegs for 9-hole peg test indicating functional pinch.  Pt reported previously receiving hand therapy to address "swan neck deformity" at [name]. Pt reported also seeing rheumatologist. Pt reported there wasn't much progress as a result of therapy and pt reported previously trying splinting in the past without much success.  Pt ambulated with A/E.                                                                                                                            TREATMENT DATE:   Self-Care HEP: OT educated pt on HEP carryover and importance of continuing HEP consistently to maintain progress. Pt acknowledged understanding. OT  encouraged pt to increase HEP from 2x per week to 3x per week. Pt verbalized understanding.  Tremor compensation strategies: OT educated pt on Tremor Compensation strategies and A/E options to address BUE tremors and improve participation in ADL/IADL. Handout provided, see pt instructions. Pt acknowledged understanding of all.  Simulated feeding, handwriting,  and simulated toothbrushing tasks using A/E options: weighted eating utensils, built-up handles, foam tubing for built-up handles, weighted pen - to improve understanding of A/E options, to increase ind for functional tasks, to improve understanding of tremor reduction strategies. Pt reported preference for weighted eating utensils and weighted writing utensil with foam tubing for built-up handle.  OT provided pt with foam tubing for use during daily feeding, grooming, and handwriting tasks PRN.  OT educated pt on joint protection strategies to avoid strain to B hands/digits, e.g. using webspace of B hands to grasp items. Pt returned demonstration of strategies following therapist modeling and v/c for efficiency.  OT educated pt on consistently participating in daily tasks to build strength/endurance and improve outcomes, and A/E options of raised commode and reacher. Pt acknowledged understanding of all.  OT reviewed today's education with pt's spouse at end of session. Pt's spouse acknowledged understanding of all.  TherAct OT assessed pt's progress towards goals, see below for updates.   Today was last scheduled visit for OT. Following discussion with pt, pt politely requested to D/C from OT today.   PATIENT EDUCATION: Education details: see today's treatment above Person educated: Patient and Spouse Education method: Explanation, Demonstration, Tactile cues, Verbal cues, and Handouts Education comprehension: verbalized understanding, returned demonstration, and needs further education  HOME EXERCISE PROGRAM: 06/17/23 - Practice LB  dressing with yellow theraband loop and actual pants/shoes using reacher and step stool 07/02/23 - red theraband, see pt instructions. 07/15/23 - tremor compensation strategies (Handout provided, see pt instructions.)   GOALS: Goals reviewed with patient? Yes  SHORT TERM GOALS: Target date: 07/05/23  Pt will be ind with initial general strengthening/endurance HEP Baseline: new to outpt OT 07/09/23 - Pt reported not completing HEP at home. OT provided pt with HEP schedule/checklist. 07/15/23 - Pt reported completing exercises with visual handouts, assistance from spouse. Pt reported completing HEP 2x per week following discussion at previous OT session. Goal status: MET  2.  Pt will demo improved standing tolerance as evidenced by standing for at least 5 minutes while participating in functional task at tabletop with no more than supervision assist. Baseline: Pt reported decreased strength and endurance after R hip surgery. 07/09/23 - Pt demo'd static standing tolerance for 5 to 10 minutes during functional tasks today with SBA to supervision assist. Goal status: MET  3.  Pt will verbalize understanding of tremor reduction strategies to increase ind for handwriting and grooming ADL considerations. Baseline: new to outpt OT 07/15/23 - OT educated pt on strategies and pt returned demonstration of strategies. Goal status: 07/15/23 - MET  4.  Pt will verbalize understanding of A/E options for bathing ADL tasks, including but not limited to transfer bench for tub shower, shower stool, long-handled sponge. Baseline: Pt currently participating in sponge-bathing with assistance to complete bathing ADL. 07/15/23 - Pt ind recalled use of shower chair, curved long-handled sponge. Pt reported using shower chair currently and considering obtaining long-handled sponge for bathing. Goal status: 07/15/23 - MET  5.  Pt will verbalize understanding of A/E options for toileting ADL tasks, including but not limited to  bidet, toilet aid, reacher for clothing management. Baseline: Assistance required 07/09/23 - OT educated pt on A/E options. Pt verbalized understanding. 07/15/23 - Pt ind recalled A/E options of commode arm rails, reacher, and bidet. Pt reported using commode arm rails currently and considering obtaining a reacher for ADL/IADL. Goal status: 07/15/23 - MET  6.  Pt will verbalize understanding of A/E options to improve  ability to pick up items from floor.  Baseline: New to outpt OT. Pt reported "moderate difficulty" to pick up items from floor and reported PSFS individual score for picking up items from floor as 1 out of 10. 07/15/23 - Pt reported continuing moderate to severe difficulty to pick up items from the floor though individual item PSFS score noted to improve from 1/10 to 3/10. Pt ind recalled A/E option of using reacher to assist with picking up items though pt currently does not have a reacher.  Goal status: MET  LONG TERM GOALS: Target date: 08/02/23  Pt will be ind with general strengthening/endurance HEP Baseline: new to outpt OT 07/15/23 - Pt reported completing exercises with visual handouts, assistance from spouse. Pt reported completing HEP 2x per week following discussion at previous OT session. Goal status: MET  2.  Pt will demo understanding of safe LB dressing strategies using adaptive strategies and A/E PRN, including completion of LB dressing while seated. Baseline: Pt reported currently requiring assistance for LB dressing tasks. Pt reported hx of previous falls d/t attempting to dress while standing.  07/09/23 - Pt reported that dressing tasks are going well, completing LB dressing while seated. Pt reported understanding of reacher and step-stool though has not obtained items. 07/15/23 - Pt reported completing LB dressing while seated and using strategies to orient clothing to assist with donning clothes. Pt ind recalled use of A/E reacher and step-stool for LB dressing tasks  though currently does not have these A/E options. Goal status: 07/15/23 - MET  3. Patient will report at least two-point increase in average PSFS score or at least three-point increase in a single activity score indicating functionally significant improvement given minimum detectable change. Baseline: PSFS: 2.0 total score (See above for individual activity scores)  07/15/23 - PSFS: 3.7 Goal status: Not met  ASSESSMENT:  CLINICAL IMPRESSION: Pt has met 100% STG and 2 out of 3 LTG to satisfactory levels, indicating good progress towards goals. Pt noted to ind recall several A/E recommendations to improve ind for ADL/IADL tasks though has not obtained most A/E recommendations at time of OT D/C.   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 Per discussion at 06/07/23 initial OT eval and per discussion today, pt politely declined options to potentially address BUE. Pt continuing to attend PT sessions for hip fx referring dx, see PT notes for additional details.  Pt has all needed materials and education. Pt understands how to continue on with self-management. See tx notes for more details. OT recommended several A/E options throughout POC to improve ADL/IADL participation. Pt returned demonstration of using several A/E items during sessions and ind recalled purpose of several A/E options. However, pt reported he has not yet obtained most A/E options at time of OT D/C. Per pt, pt is considering obtaining long-handled sponge, reacher, weighted eating utensils, and weighted pen.   Patient goals were partially met. Patient is being discharged due to pt request and pt is pleased with current functional level as pertains to OT.  PERFORMANCE DEFICITS: in functional skills including ADLs, IADLs, coordination, dexterity, proprioception, sensation, ROM, strength, pain, flexibility, Fine motor control, Gross motor  control, mobility, balance, body mechanics, endurance, decreased knowledge of use of DME, and UE functional use, cognitive skills including energy/drive, and psychosocial skills including environmental adaptation.   IMPAIRMENTS: are limiting patient from ADLs, IADLs, leisure, and social participation.   CO-MORBIDITIES:  may have co-morbidities  that affects occupational performance. Patient will benefit from skilled OT to address above impairments and improve overall function.  MODIFICATION OR ASSISTANCE TO COMPLETE EVALUATION: Min-Moderate modification of tasks or assist with assess necessary to complete an evaluation.  OT OCCUPATIONAL PROFILE AND HISTORY: Problem focused assessment: Including review of records relating to presenting problem.  CLINICAL DECISION MAKING: LOW - limited treatment options, no task modification necessary  REHAB POTENTIAL: Good  EVALUATION COMPLEXITY: Low    PLAN:  OT FREQUENCY: 1x/week  OT DURATION: 6 weeks (dates extended to allow for scheduling)  PLANNED INTERVENTIONS: 97168 OT Re-evaluation, 97535 self care/ADL training, 16109 therapeutic exercise, 97530 therapeutic activity, 97112 neuromuscular re-education, 97140 manual therapy, 97760 Orthotics management and training, 60454 Splinting (initial encounter), [phone] Subsequent splinting/medication, passive range of motion, functional mobility training, energy conservation, patient/family education, and DME and/or AE instructions  RECOMMENDED OTHER SERVICES: PT eval completed  CONSULTED AND AGREED WITH PLAN OF CARE: Patient and family member/caregiver  PLAN FOR NEXT SESSION:   N/A - OT D/C completed today   Oakley Bellman, OT 07/15/2023, 5:06 PM

## 2023-07-17 ENCOUNTER — Ambulatory Visit: Payer: Medicare Other | Admitting: Physician Assistant

## 2023-07-17 ENCOUNTER — Encounter: Payer: Self-pay | Admitting: Physician Assistant

## 2023-07-17 VITALS — Wt 121.6 lb

## 2023-07-17 DIAGNOSIS — R630 Anorexia: Secondary | ICD-10-CM

## 2023-07-17 DIAGNOSIS — F339 Major depressive disorder, recurrent, unspecified: Secondary | ICD-10-CM

## 2023-07-17 DIAGNOSIS — G4700 Insomnia, unspecified: Secondary | ICD-10-CM

## 2023-07-17 MED ORDER — MIRTAZAPINE 30 MG PO TABS: 30.0000 mg | ORAL_TABLET | Freq: Every day | ORAL | 1 refills | Status: DC

## 2023-07-17 NOTE — Progress Notes (Unsigned)
[facility]  Patient ID: Jeremy [name],  MRN: [id number]  PCP: Irena Reichmann, DO  Date of Evaluation: 07/17/2023 Time spent:25 minutes  Chief Complaint:  Chief Complaint   Depression; Follow-up    HISTORY/CURRENT STATUS: HPI  For routine med check.    At his last visit we doubled the Abilify.  He has been on the current dose for approximately 6 weeks now.  He does not feel like it has helped at all.  He is not able to give specifics.  States he wants to stay in bed a lot again and he is not eating well.  Reports low energy.  ADLs and personal hygiene are at his baseline before the hip fracture.  He has no plans to have a garden this year.  Now saying he cannot do it because of his hip, but he has not wanted to for the past several years because of the depression.  No anxiety reported.  He was given Ativan several years ago and has not taken it at all.  No suicidal or homicidal thoughts.    He is getting out of the house twice a week to go to physical therapy though.  States his hip is gradually getting better.  It does not hurt too much.    Patient denies increased energy with decreased need for sleep, increased talkativeness, racing thoughts, impulsivity or risky behaviors, increased spending, increased libido, grandiosity, increased irritability or anger, paranoia, or hallucinations.  No recent falls, none since the fractured hip and December.  Denies dizziness, syncope, seizures, numbness, tingling, tremor, tics, slurred speech, confusion.  Having some pain in the affected hip but not severe.  Not debilitating.  Individual Medical History/ Review of Systems: Changes? :Yes   he is still in therapy for his right hip fracture status post hip replacement in December  Past medications for mental health diagnoses include: (None listed in old chart) Lithium he took only for a few weeks for severe depression with suicidal thoughts but did not like it so he stopped it.  cymbalta,  mirtazapine, Ativan,   Was hospitalized many times since 1964, most recent was in 2005.   Allergies: Aspergillus allergy skin test, Aspirin, Sulfa antibiotics, and Theophyllines  Current Medications:  Current Outpatient Medications:    acetaminophen (TYLENOL) 325 MG tablet, Take 1-2 tablets (325-650 mg total) by mouth every 4 (four) hours as needed., Disp: , Rfl:    ARIPiprazole (ABILIFY) 2 MG tablet, Take 2 tablets (4 mg total) by mouth daily., Disp: 180 tablet, Rfl: 0   DULoxetine (CYMBALTA) 60 MG capsule, Take 2 capsules (120 mg total) by mouth daily., Disp: 180 capsule, Rfl: 1   Fe Fum-Vit C-Vit B12-FA (TRIGELS-F FORTE) CAPS capsule, Take 1 capsule by mouth daily after breakfast., Disp: 30 capsule, Rfl: 0   finasteride (PROPECIA) 1 MG tablet, Take 1 mg by mouth daily., Disp: , Rfl:    tamsulosin (FLOMAX) 0.4 MG CAPS capsule, Take 1 capsule (0.4 mg total) by mouth every other day., Disp: , Rfl:    clotrimazole-betamethasone (LOTRISONE) cream, APPLY TO AFFECTED AREA TWICE A DAY (Patient not taking: Reported on 06/06/2023), Disp: 30 g, Rfl: 0   methocarbamol (ROBAXIN) 500 MG tablet, Take 1 tablet (500 mg total) by mouth every 6 (six) hours as needed for muscle spasms. (Patient not taking: Reported on 06/06/2023), Disp: 30 tablet, Rfl: 0   mirtazapine (REMERON) 30 MG tablet, Take 1 tablet (30 mg total) by mouth at bedtime., Disp: 90 tablet, Rfl: 1  Oxycodone HCl 10 MG TABS, Take 1 tablet (10 mg total) by mouth 2 (two) times daily. (Patient not taking: Reported on 07/17/2023), Disp: 14 tablet, Rfl: 0   senna (SENOKOT) 8.6 MG TABS tablet, Take 1 tablet (8.6 mg total) by mouth daily. (Patient not taking: Reported on 07/17/2023), Disp: , Rfl:    Vitamin D, Ergocalciferol, (DRISDOL) 1.25 MG (50000 UNIT) CAPS capsule, Take by mouth., Disp: , Rfl:  Medication Side Effects:  States that doubling the Abilify caused "psychotic dreams."  He was not sure whether he was awake or asleep.  They went away after a few  weeks.  Family Medical/ Social History: Changes? No  MENTAL HEALTH EXAM:  Weight 121 lb 9.6 oz (55.2 kg).Body mass index is 19.63 kg/m.  11# weight gain in 3.5 months  General Appearance: Casual, Well Groomed, and thin, arthritic changes of his hands bilaterally  Eye Contact:  Good  Speech:  Clear and Coherent and Normal Rate  Volume:  Decreased  Mood:  Euthymic  Affect:   Jaran smiled several times and even chuckled a little  Thought Process:  Goal Directed and Descriptions of Associations: Circumstantial  Orientation:  Full (Time, Place, and Person)  Thought Content: Logical   Suicidal Thoughts:  No  Homicidal Thoughts:  No  Memory:  WNL  Judgement:  Good  Insight:  Good  Psychomotor Activity:   He is walking without a walker, at normal speed balance appears to be great, mild fine motor tremor in hands bilaterally  Concentration:  Concentration: Good and Attention Span: Good  Recall:  Good  Fund of Knowledge: Good  Language: Good  Assets:  Special educational needs teacher  ADL's:  Intact  Cognition: WNL  Prognosis:  Fair   DIAGNOSES:  No diagnosis found.  Receiving Psychotherapy: Yes Dr. Mardelle Matte Mitchum   RECOMMENDATIONS:  PDMP was reviewed.  Given 14 oxycodone on 06/03/2023. I provided 25 minutes of face to face time during this encounter, including time spent before and after the visit in records review, medical decision making, counseling pertinent to today's visit, and charting.        Continue Abilify 2 mg, to 2 every day. Continue Cymbalta 60 mg, 2 p.o. daily. Continue mirtazapine 30 mg, 1 p.o. nightly. Recommend restarting multivitamin, vitamin D, B complex, and fish oil. Continue therapy with Dr. [name]. Return in 6 weeks.  Melony Overly, PA-C

## 2023-07-18 ENCOUNTER — Ambulatory Visit: Payer: Medicare Other | Admitting: Physical Therapy

## 2023-07-18 DIAGNOSIS — M6281 Muscle weakness (generalized): Secondary | ICD-10-CM

## 2023-07-18 DIAGNOSIS — R2689 Other abnormalities of gait and mobility: Secondary | ICD-10-CM

## 2023-07-18 DIAGNOSIS — M25551 Pain in right hip: Secondary | ICD-10-CM

## 2023-07-18 DIAGNOSIS — R2681 Unsteadiness on feet: Secondary | ICD-10-CM

## 2023-07-18 DIAGNOSIS — S72001S Fracture of unspecified part of neck of right femur, sequela: Secondary | ICD-10-CM

## 2023-07-18 NOTE — Therapy (Signed)
OUTPATIENT PHYSICAL THERAPY LOWER EXTREMITY TREATMENT - RECERTIFICATION   Patient Name: Jeremy [name] MRN: [id number] DOB:11-21-1935, 88 y.o., male Today's Date: 07/18/2023       END OF SESSION:  PT End of Session - 07/18/23 1403     Visit Number 11    Number of Visits 15   recert   Date for PT Re-Evaluation 08/29/23   recert   Authorization Type Medicare    Progress Note Due on Visit 10    PT Start Time 1404   pt arrived late   PT Stop Time 1444    PT Time Calculation (min) 40 min    Equipment Utilized During Treatment Gait belt    Activity Tolerance Patient tolerated treatment well    Behavior During Therapy Flat affect                    Past Medical History:  Diagnosis Date   Aortic atherosclerosis (HCC)    BPH (benign prostatic hyperplasia)    Bronchiectasis (HCC)    Depression    Depression    GAD (generalized anxiety disorder)    Past Surgical History:  Procedure Laterality Date   APPENDECTOMY     HERNIA REPAIR     TOTAL HIP ARTHROPLASTY Right 05/21/2023   Procedure: RIGHT TOTAL HIP ARTHROPLASTY;  Surgeon: Kathryne Hitch, MD;  Location: [facility] OR;  Service: Orthopedics;  Laterality: Right;   Patient Active Problem List   Diagnosis Date Noted   Malnutrition of moderate degree 05/23/2023   Closed right hip fracture (HCC) 05/23/2023   Closed subcapital fracture of right femur (HCC) 05/21/2023   Closed right hip fracture, initial encounter (HCC) 05/20/2023   Normocytic anemia 05/20/2023   Bronchiectasis (HCC) 05/20/2023   First degree AV block 05/20/2023   Anxiety and depression 05/20/2023   BPH (benign prostatic hyperplasia) 05/20/2023   Severe depression (HCC) 06/09/2021   Generalized anxiety disorder 06/09/2021   History of psychosis 06/09/2021   Insomnia 09/11/2020    PCP: Irena Reichmann, DO  REFERRING PROVIDER: Charlton Amor, PA-C  REFERRING DIAG: S72.001A (ICD-10-CM) - Fracture of unspecified part of neck of right femur,  initial encounter for closed fracture  THERAPY DIAG:  Muscle weakness (generalized)  Unsteadiness on feet  Pain in right hip  Closed fracture of right hip, sequela  Other abnormalities of gait and mobility  Rationale for Evaluation and Treatment: Rehabilitation  ONSET DATE: 05/30/2023 (referral date)  SUBJECTIVE:   SUBJECTIVE STATEMENT: Pt asks for his wife Jeremy [name] to be part of discussion regarding POC and adding PT visits.   Pt reports no pain today but that his RLE feels "heavy". He reports that he has been doing the new stretch from last visit and that has been helpful for managing the tightness. Pt also reports he has been working on his squats.  Wife: Jeremy [name] in lobby for most of session, comes to therapy session for POC discussion  PERTINENT HISTORY: PMH: BPH, bronchiectasis, severe depression, anxiety and aortic atherosclerosis  PAIN:  Are you having pain? No  PRECAUTIONS: Fall  RED FLAGS: None   WEIGHT BEARING RESTRICTIONS: Yes WBAT RLE  FALLS:  Has patient fallen in last 6 months? Yes. Number of falls 3 in the past 6 months, feel like legs slip out from under him; usually needs help to get back up  LIVING ENVIRONMENT: Lives with: lives with their spouse Lives in: House/apartment Stairs: Yes: External: 3-4 steps; has a post to hold onto Has following equipment at  home: Dan Humphreys - 4 wheeled, shower chair, and Grab bars  OCCUPATION: retired  PLOF: Independent with gait, Independent with transfers, and Requires assistive device for independence  PATIENT GOALS: "walk independently"  NEXT MD VISIT: sees hip surgeon in 4 weeks (06/30/22)  OBJECTIVE:  Note: Objective measures were completed at Evaluation unless otherwise noted.  DIAGNOSTIC FINDINGS:  R hip xray prior to surgery 05/20/23 FINDINGS: SI joints are non widened. Pubic symphysis and rami appear intact. Acute right femoral neck fracture. No femoral head dislocation   IMPRESSION: Acute right  femoral neck fracture.  COGNITION: Overall cognitive status: Difficulty to assess due to: flat affect       POSTURE: rounded shoulders, forward head, and posterior pelvic tilt   LOWER EXTREMITY ROM:  Active ROM Right eval Left eval  Hip flexion Decreased due to pain   Hip extension    Hip abduction    Hip adduction    Hip internal rotation    Hip external rotation    Knee flexion    Knee extension    Ankle dorsiflexion    Ankle plantarflexion    Ankle inversion    Ankle eversion     (Blank rows = not tested)  LOWER EXTREMITY MMT:  MMT Right eval Left eval  Hip flexion 4 5  Hip extension    Hip abduction    Hip adduction    Hip internal rotation    Hip external rotation    Knee flexion 4 5  Knee extension 4 5  Ankle dorsiflexion 5 5  Ankle plantarflexion    Ankle inversion    Ankle eversion     (Blank rows = not tested)                                                                                                                                 TREATMENT:  [facility] for LTG assessment:  [facility] PT Assessment - 07/18/23 1421       Ambulation/Gait   Gait velocity 32.8 ft over 8.69 sec = 3.78 ft/sec      Standardized Balance Assessment   Standardized Balance Assessment Timed Up and Go Test;Five Times Sit to Stand;Berg Balance Test    Five times sit to stand comments  9.4 sec   no UE     Timed Up and Go Test   TUG Normal TUG    Normal TUG (seconds) 8.47   no AD           To work on functional strengthening and dynamic standing balance: Resisted gait with blue bungee: Backwards gait 4 x 25 ft Several instances of LOB but able to recover Forwards gait 4 x 25 ft Lateral gait 2 x 25 ft L/R   Self-Care/Home Management  Discussed POC moving forward with recommendation of 1x/week for 4 weeks with focus on endurance and balance. Pt and his wife agreeable to add visits this date. Pt has been compliant with  working on his HEP with reports of working on  modified [employer] and squats.   PATIENT EDUCATION:  Education details: results of OM and functional implications and justification for continued PT services, continue HEP and getting out of bed program as able Education method: Explanation and Demonstration Education comprehension: verbalized understanding, returned demonstration, and needs further education  HOME EXERCISE PROGRAM: Access Code: [email or web address] URL: [email or web address] Date: 06/13/2023 Prepared by: Alethia Berthold Plaster  Exercises - Side Stepping with Resistance at Thighs and Counter Support  - 1 x daily - 7 x weekly - 3 sets - 10 reps - Forward Backward Monster Walk with Band at [employer] and Counter Support  - 1 x daily - 7 x weekly - 3 sets - 10 reps - Bird Dog  - 1 x daily - 7 x weekly - 2 sets - 10 reps - Squat with Chair Touch  - 1 x daily - 7 x weekly - 2 sets - 10 reps - Modified Thomas Stretch  - 1 x daily - 7 x weekly - 1 sets - 3-5 reps - 30 sec hold  *challenged pt to get up out of bed 3x/day every day (not including bathroom breaks or meals) to work on stamina. Pt in agreement with this (2/6)    ASSESSMENT:  CLINICAL IMPRESSION: Emphasis of skilled PT session on assessing LTG, writing new LTG for recertification of PT services, and working on functional strengthening and dynamic balance training. Pt has met 4/5 LTG due to improving his functional LE strength, improving his balance, and decreasing his fall risk with improved scores on the 5xSTS, TUG, gait speed, and Berg. He is making progress towards 1/5 LTG as he is not quite independent and 100% compliant with his HEP but he is making progress towards it. Based on functional outcome measures performed last session (FGA, ), patient could benefit from continued skilled PT services in order to improve his endurance, improve his balance, and decrease his fall risk if he is agreeable. Continue POC.    OBJECTIVE IMPAIRMENTS: Abnormal gait, decreased  balance, decreased knowledge of use of DME, decreased mobility, decreased ROM, decreased strength, impaired UE functional use, postural dysfunction, and pain.   ACTIVITY LIMITATIONS: carrying, lifting, bending, standing, stairs, transfers, and bed mobility  PARTICIPATION LIMITATIONS: meal prep, cleaning, driving, and community activity  PERSONAL FACTORS: Age and 1-2 comorbidities:    BPH, bronchiectasis, severe depression, anxiety and aortic atherosclerosisare also affecting patient's functional outcome.   REHAB POTENTIAL: Good  CLINICAL DECISION MAKING: Stable/uncomplicated  EVALUATION COMPLEXITY: Low   GOALS: Goals reviewed with patient? Yes  SHORT TERM GOALS: Target date: 06/28/2023   Pt will be independent with initial HEP for improved strength, balance, transfers and gait. Baseline: not compliant (1/28) Goal status: NOT MET  2.  Pt will improve 5 x STS to less than or equal to 22 seconds to demonstrate improved functional strength and transfer efficiency.  Baseline: 26.18 sec no UE (1/3); 18.56 sec no UE (1/28) Goal status: MET  3.  Pt will improve gait velocity to at least 3.25 ft/sec for improved gait efficiency and performance at mod I level  Baseline: 2.92 ft/sec with rollator (1/3); 3.54 ft/sec w/ rollator (1/28) Goal status: MET  4.  Pt will improve normal TUG to less than or equal to 19 seconds for improved functional mobility and decreased fall risk. Baseline: 22.62 sec with rollator (1/3); 16.09 sec w/ rollator (1/28) Goal status: MET  5.  Pt will improve Berg score to 37/56 for  decreased fall risk Baseline: 33/56 (1/3), 53/56 (1/30) Goal status: MET   LONG TERM GOALS: Target date: 07/19/2023  Pt will be independent with final HEP for improved strength, balance, transfers and gait. Baseline:  Goal status: IN PROGRESS  2.  Pt will improve 5 x STS to less than or equal to 18 seconds to demonstrate improved functional strength and transfer efficiency.   Baseline: 26.18 sec no UE (1/3), 9.4 sec no UE (2/13) Goal status: MET  3.  Pt will improve gait velocity to at least 3.75 ft/sec for improved gait efficiency and performance at mod I level  Baseline: 2.92 ft/sec with rollator (1/3); 3.54 ft/sec w/ rollator (1/28), 3.78 ft/sec no AD (2/13) Goal status: MET  4.  Pt will improve normal TUG to less than or equal to 13 seconds for improved functional mobility and decreased fall risk. Baseline: 22.62 sec with rollator (1/3); 16.09 sec w/ rollator (1/28), 8.47 sec no AD (2/13) Goal status: MET  5.  Pt will improve Berg score to 41/56 for decreased fall risk Baseline: 33/56 (1/3), 53/56 (1/30) Goal status: MET   NEW SHORT TERM GOALS=LONG TERM GOALS due to length of POC  NEW LONG TERM GOALS:  Target date: 08/15/2023  Pt will be independent with final HEP for improved strength, balance, transfers and gait. Baseline:  Goal status: IN PROGRESS  2.  Pt will ambulate greater than or equal to 1300 feet on with no AD and mod I for improved cardiovascular endurance and BLE strength.  Baseline: 967 ft (2/10) Goal status: INITIAL  3.  Pt will improve FGA to 25/30 for decreased fall risk  Baseline: 18/30 (2/10) Goal status: INITIAL    PLAN:  PT FREQUENCY: 2x/week + 1x/week (recert)  PT DURATION: 6 weeks + 4 weeks (recert)  PLANNED INTERVENTIONS: 57846- PT Re-evaluation, 97110-Therapeutic exercises, 97530- Therapeutic activity, 97112- Neuromuscular re-education, 97535- Self Care, [id number]- Manual therapy, [phone]- Gait training, Patient/Family education, Balance training, Stair training, Taping, Dry Needling, Scar mobilization, DME instructions, Cryotherapy, and Moist heat  PLAN FOR NEXT SESSION: how is HEP/getting out of bed? lifting and functional strength , progress deadlift height, standing to taps, tilt board, forward bend to cone tap, step out slam balls, leg press?  Foam beam - ball toss, tandem, heel raise; reactive balance -  perturbations in // bars, resisted STS > slam ball; work on increasing step length, step height, LE clearance, SLS; gait outdoors across various surfaces, curbs, etc.; endurance, balance (head turns, backwards, EC, obstacle navigation)   Peter Congo, PT Peter Congo, PT, DPT, CSRS   07/18/2023, 2:45 PM

## 2023-07-25 ENCOUNTER — Ambulatory Visit: Payer: Medicare Other | Admitting: Physical Therapy

## 2023-07-25 DIAGNOSIS — R2689 Other abnormalities of gait and mobility: Secondary | ICD-10-CM | POA: Diagnosis not present

## 2023-07-25 DIAGNOSIS — M6281 Muscle weakness (generalized): Secondary | ICD-10-CM | POA: Diagnosis not present

## 2023-07-25 DIAGNOSIS — R2681 Unsteadiness on feet: Secondary | ICD-10-CM | POA: Diagnosis not present

## 2023-07-25 DIAGNOSIS — S72001S Fracture of unspecified part of neck of right femur, sequela: Secondary | ICD-10-CM | POA: Diagnosis not present

## 2023-07-25 DIAGNOSIS — M25551 Pain in right hip: Secondary | ICD-10-CM | POA: Diagnosis not present

## 2023-07-25 NOTE — Therapy (Signed)
 OUTPATIENT PHYSICAL THERAPY LOWER EXTREMITY TREATMENT    Patient Name: Jeremy [name] MRN: [id number] DOB:15-Aug-1935, 88 y.o., male Today's Date: 07/25/2023    END OF SESSION:  PT End of Session - 07/25/23 1401     Visit Number 12    Number of Visits 15   recert   Date for PT Re-Evaluation 08/29/23   recert   Authorization Type Medicare    Progress Note Due on Visit 10    PT Start Time 1400    PT Stop Time 1439    PT Time Calculation (min) 39 min    Equipment Utilized During Treatment Gait belt    Activity Tolerance Patient tolerated treatment well    Behavior During Therapy Flat affect                    Past Medical History:  Diagnosis Date   Aortic atherosclerosis (HCC)    BPH (benign prostatic hyperplasia)    Bronchiectasis (HCC)    Depression    Depression    GAD (generalized anxiety disorder)    Past Surgical History:  Procedure Laterality Date   APPENDECTOMY     HERNIA REPAIR     TOTAL HIP ARTHROPLASTY Right 05/21/2023   Procedure: RIGHT TOTAL HIP ARTHROPLASTY;  Surgeon: Jeremy Hitch, MD;  Location: [facility] OR;  Service: Orthopedics;  Laterality: Right;   Patient Active Problem List   Diagnosis Date Noted   Malnutrition of moderate degree 05/23/2023   Closed right hip fracture (HCC) 05/23/2023   Closed subcapital fracture of right femur (HCC) 05/21/2023   Closed right hip fracture, initial encounter (HCC) 05/20/2023   Normocytic anemia 05/20/2023   Bronchiectasis (HCC) 05/20/2023   First degree AV block 05/20/2023   Anxiety and depression 05/20/2023   BPH (benign prostatic hyperplasia) 05/20/2023   Severe depression (HCC) 06/09/2021   Generalized anxiety disorder 06/09/2021   History of psychosis 06/09/2021   Insomnia 09/11/2020    PCP: Jeremy Reichmann, DO  REFERRING PROVIDER: Charlton Amor, PA-C  REFERRING DIAG: S72.001A (ICD-10-CM) - Fracture of unspecified part of neck of right femur, initial encounter for closed  fracture  THERAPY DIAG:  Muscle weakness (generalized)  Unsteadiness on feet  Other abnormalities of gait and mobility  Rationale for Evaluation and Treatment: Rehabilitation  ONSET DATE: 05/30/2023 (referral date)  SUBJECTIVE:   SUBJECTIVE STATEMENT: Pt presents without AD. States he has been working on his exercises and getting out of bed to touch the window. No falls   Wife, Jeremy [name], stayed in lobby for session   PERTINENT HISTORY: PMH: BPH, bronchiectasis, severe depression, anxiety and aortic atherosclerosis  PAIN:  Are you having pain? No  PRECAUTIONS: Fall  RED FLAGS: None   WEIGHT BEARING RESTRICTIONS: Yes WBAT RLE  FALLS:  Has patient fallen in last 6 months? Yes. Number of falls 3 in the past 6 months, feel like legs slip out from under him; usually needs help to get back up  LIVING ENVIRONMENT: Lives with: lives with their spouse Lives in: House/apartment Stairs: Yes: External: 3-4 steps; has a post to hold onto Has following equipment at home: Dan Humphreys - 4 wheeled, shower chair, and Grab bars  OCCUPATION: retired  PLOF: Independent with gait, Independent with transfers, and Requires assistive device for independence  PATIENT GOALS: "walk independently"  NEXT MD VISIT: sees hip surgeon in 4 weeks (06/30/22)  OBJECTIVE:  Note: Objective measures were completed at Evaluation unless otherwise noted.  DIAGNOSTIC FINDINGS:  R hip xray prior to  surgery 05/20/23 FINDINGS: SI joints are non widened. Pubic symphysis and rami appear intact. Acute right femoral neck fracture. No femoral head dislocation   IMPRESSION: Acute right femoral neck fracture.  COGNITION: Overall cognitive status: Difficulty to assess due to: flat affect       POSTURE: rounded shoulders, forward head, and posterior pelvic tilt   LOWER EXTREMITY ROM:  Active ROM Right eval Left eval  Hip flexion Decreased due to pain   Hip extension    Hip abduction    Hip adduction     Hip internal rotation    Hip external rotation    Knee flexion    Knee extension    Ankle dorsiflexion    Ankle plantarflexion    Ankle inversion    Ankle eversion     (Blank rows = not tested)  LOWER EXTREMITY MMT:  MMT Right eval Left eval  Hip flexion 4 5  Hip extension    Hip abduction    Hip adduction    Hip internal rotation    Hip external rotation    Knee flexion 4 5  Knee extension 4 5  Ankle dorsiflexion 5 5  Ankle plantarflexion    Ankle inversion    Ankle eversion     (Blank rows = not tested)                                                                                                                                 TREATMENT:  TherAct SciFit multi-peaks level 7.5 for 8 minutes using BUE/BLEs for neural priming for reciprocal movement, dynamic cardiovascular warmup and increased amplitude of stepping.  In // bars for improved step clearance, LE coordination and single leg stability:   On blue balance beam:  Side stepping x3 down and back w/intermittent UE support. Increased difficulty stepping to L vs R side  Added 3 6" hurdles x3 reps. Min cues to ensure he takes large enough step w/leading leg to leave room for trailing leg. BUE support throughout  Added double cone taps after each hurdle, x3 reps w/BUE support. No LOB noted  Soccer dribbling x30' fwd, retro and laterally w/CGA - min A for improved LE coordination and anticipatory balance strategies. Pt most challenged in retro direction and when stepping to R side, w/single LOB in each direction requiring min A to stabilize. RPE of 6/10 following activity.  Sit to stands w/10# med ball throw, x8 reps, for improved high amplitude movement, power and immediate standing balance. Pt performed well w/no LOB, RPE of 7-8/10.    Gait pattern: step through pattern, decreased arm swing- Right, decreased arm swing- Left, decreased stride length, trunk flexed, and narrow BOS Distance walked: Various clinic  distances  Assistive device utilized: None Level of assistance: Modified independence Comments: Pt not using AD this date. Noted narrow BOS but no LOB    PATIENT EDUCATION:  Education details: Continue HEP  Education method: Medical illustrator  Education comprehension: verbalized understanding, returned demonstration, and needs further education  HOME EXERCISE PROGRAM: Access Code: [email or web address] URL: [email or web address] Date: 06/13/2023 Prepared by: Alethia Berthold Brayn Eckstein  Exercises - Side Stepping with Resistance at Thighs and Counter Support  - 1 x daily - 7 x weekly - 3 sets - 10 reps - Forward Backward Monster Walk with Band at [employer] and Counter Support  - 1 x daily - 7 x weekly - 3 sets - 10 reps - Bird Dog  - 1 x daily - 7 x weekly - 2 sets - 10 reps - Squat with Chair Touch  - 1 x daily - 7 x weekly - 2 sets - 10 reps - Modified Thomas Stretch  - 1 x daily - 7 x weekly - 1 sets - 3-5 reps - 30 sec hold  *challenged pt to get up out of bed 3x/day every day (not including bathroom breaks or meals) to work on stamina. Pt in agreement with this (2/6)    ASSESSMENT:  CLINICAL IMPRESSION: Emphasis of skilled PT session on LE coordination, single leg stability and high amplitude movement. Pt tolerated session well but did require occasional seated rest breaks following more challenging tasks. Pt reports compliance with HEP and has been walking to the window during day. Pt most challenged w/retro and lateral gait to R side, requiring min A to stabilize. Continue POC.    OBJECTIVE IMPAIRMENTS: Abnormal gait, decreased balance, decreased knowledge of use of DME, decreased mobility, decreased ROM, decreased strength, impaired UE functional use, postural dysfunction, and pain.   ACTIVITY LIMITATIONS: carrying, lifting, bending, standing, stairs, transfers, and bed mobility  PARTICIPATION LIMITATIONS: meal prep, cleaning, driving, and community activity  PERSONAL  FACTORS: Age and 1-2 comorbidities:    BPH, bronchiectasis, severe depression, anxiety and aortic atherosclerosisare also affecting patient's functional outcome.   REHAB POTENTIAL: Good  CLINICAL DECISION MAKING: Stable/uncomplicated  EVALUATION COMPLEXITY: Low   GOALS: Goals reviewed with patient? Yes   LONG TERM GOALS: Target date: 07/19/2023  Pt will be independent with final HEP for improved strength, balance, transfers and gait. Baseline:  Goal status: IN PROGRESS  2.  Pt will improve 5 x STS to less than or equal to 18 seconds to demonstrate improved functional strength and transfer efficiency.  Baseline: 26.18 sec no UE (1/3), 9.4 sec no UE (2/13) Goal status: MET  3.  Pt will improve gait velocity to at least 3.75 ft/sec for improved gait efficiency and performance at mod I level  Baseline: 2.92 ft/sec with rollator (1/3); 3.54 ft/sec w/ rollator (1/28), 3.78 ft/sec no AD (2/13) Goal status: MET  4.  Pt will improve normal TUG to less than or equal to 13 seconds for improved functional mobility and decreased fall risk. Baseline: 22.62 sec with rollator (1/3); 16.09 sec w/ rollator (1/28), 8.47 sec no AD (2/13) Goal status: MET  5.  Pt will improve Berg score to 41/56 for decreased fall risk Baseline: 33/56 (1/3), 53/56 (1/30) Goal status: MET   NEW SHORT TERM GOALS=LONG TERM GOALS due to length of POC  NEW LONG TERM GOALS:  Target date: 08/15/2023  Pt will be independent with final HEP for improved strength, balance, transfers and gait. Baseline:  Goal status: IN PROGRESS  2.  Pt will ambulate greater than or equal to 1300 feet on with no AD and mod I for improved cardiovascular endurance and BLE strength.  Baseline: 967 ft (2/10) Goal status: INITIAL  3.  Pt will improve FGA to 25/30 for  decreased fall risk  Baseline: 18/30 (2/10) Goal status: INITIAL    PLAN:  PT FREQUENCY: 2x/week + 1x/week (recert)  PT DURATION: 6 weeks + 4 weeks  (recert)  PLANNED INTERVENTIONS: 09604- PT Re-evaluation, 97110-Therapeutic exercises, 97530- Therapeutic activity, 97112- Neuromuscular re-education, 97535- Self Care, 54098- Manual therapy, [phone]- Gait training, Patient/Family education, Balance training, Stair training, Taping, Dry Needling, Scar mobilization, DME instructions, Cryotherapy, and Moist heat  PLAN FOR NEXT SESSION: how is HEP/getting out of bed? lifting and functional strength , progress deadlift height, standing to taps, tilt board, forward bend to cone tap, step out slam balls, leg press?  Foam beam - ball toss, tandem, heel raise; reactive balance - perturbations in // bars, resisted STS > slam ball; work on increasing step length, step height, LE clearance, SLS; gait outdoors across various surfaces, curbs, etc.; endurance, balance (head turns, backwards, EC, obstacle navigation)   Jill Alexanders Kehinde Bowdish, PT, DPT  07/25/2023, 2:46 PM

## 2023-08-01 ENCOUNTER — Ambulatory Visit: Payer: Medicare Other | Admitting: Physical Therapy

## 2023-08-01 DIAGNOSIS — M6281 Muscle weakness (generalized): Secondary | ICD-10-CM

## 2023-08-01 DIAGNOSIS — R2681 Unsteadiness on feet: Secondary | ICD-10-CM

## 2023-08-01 DIAGNOSIS — M25551 Pain in right hip: Secondary | ICD-10-CM | POA: Diagnosis not present

## 2023-08-01 DIAGNOSIS — R2689 Other abnormalities of gait and mobility: Secondary | ICD-10-CM

## 2023-08-01 DIAGNOSIS — S72001S Fracture of unspecified part of neck of right femur, sequela: Secondary | ICD-10-CM | POA: Diagnosis not present

## 2023-08-01 NOTE — Therapy (Signed)
 OUTPATIENT PHYSICAL THERAPY LOWER EXTREMITY TREATMENT    Patient Name: Jeremy [name] MRN: [id number] DOB:11-May-1936, 88 y.o., male Today's Date: 08/01/2023    END OF SESSION:  PT End of Session - 08/01/23 1406     Visit Number 13    Number of Visits 15   recert   Date for PT Re-Evaluation 08/29/23   recert   Authorization Type Medicare    Progress Note Due on Visit 10    PT Start Time 1404    PT Stop Time 1443    PT Time Calculation (min) 39 min    Equipment Utilized During Treatment Gait belt    Activity Tolerance Patient tolerated treatment well    Behavior During Therapy Flat affect                     Past Medical History:  Diagnosis Date   Aortic atherosclerosis (HCC)    BPH (benign prostatic hyperplasia)    Bronchiectasis (HCC)    Depression    Depression    GAD (generalized anxiety disorder)    Past Surgical History:  Procedure Laterality Date   APPENDECTOMY     HERNIA REPAIR     TOTAL HIP ARTHROPLASTY Right 05/21/2023   Procedure: RIGHT TOTAL HIP ARTHROPLASTY;  Surgeon: Kathryne Hitch, MD;  Location: [facility] OR;  Service: Orthopedics;  Laterality: Right;   Patient Active Problem List   Diagnosis Date Noted   Malnutrition of moderate degree 05/23/2023   Closed right hip fracture (HCC) 05/23/2023   Closed subcapital fracture of right femur (HCC) 05/21/2023   Closed right hip fracture, initial encounter (HCC) 05/20/2023   Normocytic anemia 05/20/2023   Bronchiectasis (HCC) 05/20/2023   First degree AV block 05/20/2023   Anxiety and depression 05/20/2023   BPH (benign prostatic hyperplasia) 05/20/2023   Severe depression (HCC) 06/09/2021   Generalized anxiety disorder 06/09/2021   History of psychosis 06/09/2021   Insomnia 09/11/2020    PCP: Irena Reichmann, DO  REFERRING PROVIDER: Charlton Amor, PA-C  REFERRING DIAG: S72.001A (ICD-10-CM) - Fracture of unspecified part of neck of right femur, initial encounter for closed  fracture  THERAPY DIAG:  Muscle weakness (generalized)  Unsteadiness on feet  Other abnormalities of gait and mobility  Rationale for Evaluation and Treatment: Rehabilitation  ONSET DATE: 05/30/2023 (referral date)  SUBJECTIVE:   SUBJECTIVE STATEMENT: Pt presents without AD. States he has been working on his exercises "sporadically". Denies pain or acute changes   Wife, [name], stayed in lobby for session   PERTINENT HISTORY: PMH: BPH, bronchiectasis, severe depression, anxiety and aortic atherosclerosis  PAIN:  Are you having pain? No  PRECAUTIONS: Fall  RED FLAGS: None   WEIGHT BEARING RESTRICTIONS: Yes WBAT RLE  FALLS:  Has patient fallen in last 6 months? Yes. Number of falls 3 in the past 6 months, feel like legs slip out from under him; usually needs help to get back up  LIVING ENVIRONMENT: Lives with: lives with their spouse Lives in: House/apartment Stairs: Yes: External: 3-4 steps; has a post to hold onto Has following equipment at home: Dan Humphreys - 4 wheeled, shower chair, and Grab bars  OCCUPATION: retired  PLOF: Independent with gait, Independent with transfers, and Requires assistive device for independence  PATIENT GOALS: "walk independently"  NEXT MD VISIT: sees hip surgeon in 4 weeks (06/30/22)  OBJECTIVE:  Note: Objective measures were completed at Evaluation unless otherwise noted.  DIAGNOSTIC FINDINGS:  R hip xray prior to surgery 05/20/23 FINDINGS: SI  joints are non widened. Pubic symphysis and rami appear intact. Acute right femoral neck fracture. No femoral head dislocation   IMPRESSION: Acute right femoral neck fracture.  COGNITION: Overall cognitive status: Difficulty to assess due to: flat affect       POSTURE: rounded shoulders, forward head, and posterior pelvic tilt   LOWER EXTREMITY ROM:  Active ROM Right eval Left eval  Hip flexion Decreased due to pain   Hip extension    Hip abduction    Hip adduction    Hip  internal rotation    Hip external rotation    Knee flexion    Knee extension    Ankle dorsiflexion    Ankle plantarflexion    Ankle inversion    Ankle eversion     (Blank rows = not tested)  LOWER EXTREMITY MMT:  MMT Right eval Left eval  Hip flexion 4 5  Hip extension    Hip abduction    Hip adduction    Hip internal rotation    Hip external rotation    Knee flexion 4 5  Knee extension 4 5  Ankle dorsiflexion 5 5  Ankle plantarflexion    Ankle inversion    Ankle eversion     (Blank rows = not tested)                                                                                                                                 TREATMENT:  TherAct Per pt request, SciFit multi-peaks level 9 for 10 minutes using BUE/BLEs for neural priming for reciprocal movement, dynamic cardiovascular warmup and increased amplitude of stepping.  In // bars, alt fwd and lateral step up w/march on bosu (blue side up), x10 reps per side for improved single leg stability, functional BLE strength and LE coordination. Increased difficulty w/lateral stepping on R side, but no LOB noted. CGA throughout w/intermittent UE support.  Standing parallel to rebounder, alt fwd step w/rotation and 2kg ball throw/catch, x10 reps per side per direction for improved step clearance, anticipatory balance strategies and coordination. CGA-min A throughout. Increased difficulty rotating to L side.  Pt required seated rest break between sides. RPE of 6-7/10 following activity.   Gait pattern: step through pattern, decreased arm swing- Right, decreased arm swing- Left, decreased stride length, trunk flexed, and narrow BOS Distance walked: Various clinic distances  Assistive device utilized: None Level of assistance: Modified independence Comments: Pt not using AD this date. Noted narrow BOS but no LOB    PATIENT EDUCATION:  Education details: Continue HEP  Education method: Software engineer Education comprehension: verbalized understanding, returned demonstration, and needs further education  HOME EXERCISE PROGRAM: Access Code: [email or web address] URL: [email or web address] Date: 06/13/2023 Prepared by: Alethia Berthold Robi Mitter  Exercises - Side Stepping with Resistance at Thighs and Counter Support  - 1 x daily - 7 x weekly - 3 sets - 10 reps - Forward Backward Monster Walk with Band at  Thighs and Counter Support  - 1 x daily - 7 x weekly - 3 sets - 10 reps - Bird Dog  - 1 x daily - 7 x weekly - 2 sets - 10 reps - Squat with Chair Touch  - 1 x daily - 7 x weekly - 2 sets - 10 reps - Modified Thomas Stretch  - 1 x daily - 7 x weekly - 1 sets - 3-5 reps - 30 sec hold  *challenged pt to get up out of bed 3x/day every day (not including bathroom breaks or meals) to work on stamina. Pt in agreement with this (2/6)    ASSESSMENT:  CLINICAL IMPRESSION: Emphasis of skilled PT session on LE coordination, single leg stability, functional BLE strength and anticipatory balance. Pt tolerated session well but did require seated rest breaks throughout due to fatigue. Pt most challenged by rotation to L side, requiring min A to stabilize and reporting stretch in R hip. Continue POC.    OBJECTIVE IMPAIRMENTS: Abnormal gait, decreased balance, decreased knowledge of use of DME, decreased mobility, decreased ROM, decreased strength, impaired UE functional use, postural dysfunction, and pain.   ACTIVITY LIMITATIONS: carrying, lifting, bending, standing, stairs, transfers, and bed mobility  PARTICIPATION LIMITATIONS: meal prep, cleaning, driving, and community activity  PERSONAL FACTORS: Age and 1-2 comorbidities:    BPH, bronchiectasis, severe depression, anxiety and aortic atherosclerosisare also affecting patient's functional outcome.   REHAB POTENTIAL: Good  CLINICAL DECISION MAKING: Stable/uncomplicated  EVALUATION COMPLEXITY: Low   GOALS: Goals reviewed with patient?  Yes   LONG TERM GOALS: Target date: 07/19/2023  Pt will be independent with final HEP for improved strength, balance, transfers and gait. Baseline:  Goal status: IN PROGRESS  2.  Pt will improve 5 x STS to less than or equal to 18 seconds to demonstrate improved functional strength and transfer efficiency.  Baseline: 26.18 sec no UE (1/3), 9.4 sec no UE (2/13) Goal status: MET  3.  Pt will improve gait velocity to at least 3.75 ft/sec for improved gait efficiency and performance at mod I level  Baseline: 2.92 ft/sec with rollator (1/3); 3.54 ft/sec w/ rollator (1/28), 3.78 ft/sec no AD (2/13) Goal status: MET  4.  Pt will improve normal TUG to less than or equal to 13 seconds for improved functional mobility and decreased fall risk. Baseline: 22.62 sec with rollator (1/3); 16.09 sec w/ rollator (1/28), 8.47 sec no AD (2/13) Goal status: MET  5.  Pt will improve Berg score to 41/56 for decreased fall risk Baseline: 33/56 (1/3), 53/56 (1/30) Goal status: MET   NEW SHORT TERM GOALS=LONG TERM GOALS due to length of POC  NEW LONG TERM GOALS:  Target date: 08/15/2023  Pt will be independent with final HEP for improved strength, balance, transfers and gait. Baseline:  Goal status: IN PROGRESS  2.  Pt will ambulate greater than or equal to 1300 feet on with no AD and mod I for improved cardiovascular endurance and BLE strength.  Baseline: 967 ft (2/10) Goal status: INITIAL  3.  Pt will improve FGA to 25/30 for decreased fall risk  Baseline: 18/30 (2/10) Goal status: INITIAL    PLAN:  PT FREQUENCY: 2x/week + 1x/week (recert)  PT DURATION: 6 weeks + 4 weeks (recert)  PLANNED INTERVENTIONS: 82956- PT Re-evaluation, 97110-Therapeutic exercises, 97530- Therapeutic activity, 97112- Neuromuscular re-education, 97535- Self Care, 21308- Manual therapy, [phone]- Gait training, Patient/Family education, Balance training, Stair training, Taping, Dry Needling, Scar mobilization, DME  instructions, Cryotherapy, and  Moist heat  PLAN FOR NEXT SESSION: how is HEP/getting out of bed? lifting and functional strength , progress deadlift height, standing to taps, tilt board, forward bend to cone tap, step out slam balls, leg press?  Foam beam - ball toss, tandem, heel raise; reactive balance - perturbations in // bars, resisted STS > slam ball; work on increasing step length, step height, LE clearance, SLS; gait outdoors across various surfaces, curbs, etc.; endurance, balance (head turns, backwards, EC, obstacle navigation)   Jill Alexanders Katelyn Broadnax, PT, DPT  08/01/2023, 2:45 PM

## 2023-08-08 ENCOUNTER — Ambulatory Visit: Payer: Medicare Other | Attending: Physician Assistant | Admitting: Physical Therapy

## 2023-08-08 DIAGNOSIS — R2681 Unsteadiness on feet: Secondary | ICD-10-CM | POA: Diagnosis not present

## 2023-08-08 DIAGNOSIS — M25551 Pain in right hip: Secondary | ICD-10-CM | POA: Insufficient documentation

## 2023-08-08 DIAGNOSIS — M6281 Muscle weakness (generalized): Secondary | ICD-10-CM | POA: Diagnosis not present

## 2023-08-08 DIAGNOSIS — R2689 Other abnormalities of gait and mobility: Secondary | ICD-10-CM | POA: Insufficient documentation

## 2023-08-08 DIAGNOSIS — S72001S Fracture of unspecified part of neck of right femur, sequela: Secondary | ICD-10-CM | POA: Diagnosis not present

## 2023-08-08 NOTE — Therapy (Signed)
 OUTPATIENT PHYSICAL THERAPY LOWER EXTREMITY TREATMENT    Patient Name: Jeremy [name] MRN: [id number] DOB:Mar 11, 1936, 88 y.o., male Today's Date: 08/08/2023    END OF SESSION:  PT End of Session - 08/08/23 1403     Visit Number 14    Number of Visits 15   recert   Date for PT Re-Evaluation 08/29/23   recert   Authorization Type Medicare    Progress Note Due on Visit 10    PT Start Time 1400    PT Stop Time 1444    PT Time Calculation (min) 44 min    Equipment Utilized During Treatment Gait belt    Activity Tolerance Patient tolerated treatment well    Behavior During Therapy Flat affect                      Past Medical History:  Diagnosis Date   Aortic atherosclerosis (HCC)    BPH (benign prostatic hyperplasia)    Bronchiectasis (HCC)    Depression    Depression    GAD (generalized anxiety disorder)    Past Surgical History:  Procedure Laterality Date   APPENDECTOMY     HERNIA REPAIR     TOTAL HIP ARTHROPLASTY Right 05/21/2023   Procedure: RIGHT TOTAL HIP ARTHROPLASTY;  Surgeon: Kathryne Hitch, MD;  Location: [facility] OR;  Service: Orthopedics;  Laterality: Right;   Patient Active Problem List   Diagnosis Date Noted   Malnutrition of moderate degree 05/23/2023   Closed right hip fracture (HCC) 05/23/2023   Closed subcapital fracture of right femur (HCC) 05/21/2023   Closed right hip fracture, initial encounter (HCC) 05/20/2023   Normocytic anemia 05/20/2023   Bronchiectasis (HCC) 05/20/2023   First degree AV block 05/20/2023   Anxiety and depression 05/20/2023   BPH (benign prostatic hyperplasia) 05/20/2023   Severe depression (HCC) 06/09/2021   Generalized anxiety disorder 06/09/2021   History of psychosis 06/09/2021   Insomnia 09/11/2020    PCP: Irena Reichmann, DO  REFERRING PROVIDER: Charlton Amor, PA-C  REFERRING DIAG: S72.001A (ICD-10-CM) - Fracture of unspecified part of neck of right femur, initial encounter for closed  fracture  THERAPY DIAG:  Muscle weakness (generalized)  Unsteadiness on feet  Other abnormalities of gait and mobility  Pain in right hip  Closed fracture of right hip, sequela  Rationale for Evaluation and Treatment: Rehabilitation  ONSET DATE: 05/30/2023 (referral date)  SUBJECTIVE:   SUBJECTIVE STATEMENT: Pt reports that getting out of bed daily has been "no problem". He does report he has been feeling "pucky" today and that his low back pain has been flaring up. See below for details.  Pt reports no falls since last visit, R hip is doing okay.  Wife, [name], stayed in lobby for session   PERTINENT HISTORY: PMH: BPH, bronchiectasis, severe depression, anxiety and aortic atherosclerosis  PAIN:  Are you having pain? Yes: NPRS scale: not rated Pain location: low back (L4/5) Pain description: sore/tight Aggravating factors: stooping Relieving factors: taking it easy  PRECAUTIONS: Fall  RED FLAGS: None   WEIGHT BEARING RESTRICTIONS: Yes WBAT RLE  FALLS:  Has patient fallen in last 6 months? Yes. Number of falls 3 in the past 6 months, feel like legs slip out from under him; usually needs help to get back up  LIVING ENVIRONMENT: Lives with: lives with their spouse Lives in: House/apartment Stairs: Yes: External: 3-4 steps; has a post to hold onto Has following equipment at home: Dan Humphreys - 4 wheeled, shower chair,  and Grab bars  OCCUPATION: retired  PLOF: Independent with gait, Independent with transfers, and Requires assistive device for independence  PATIENT GOALS: "walk independently"  NEXT MD VISIT: sees hip surgeon in 4 weeks (06/30/22)  OBJECTIVE:  Note: Objective measures were completed at Evaluation unless otherwise noted.  DIAGNOSTIC FINDINGS:  R hip xray prior to surgery 05/20/23 FINDINGS: SI joints are non widened. Pubic symphysis and rami appear intact. Acute right femoral neck fracture. No femoral head dislocation   IMPRESSION: Acute right  femoral neck fracture.  COGNITION: Overall cognitive status: Difficulty to assess due to: flat affect       POSTURE: rounded shoulders, forward head, and posterior pelvic tilt   LOWER EXTREMITY ROM:  Active ROM Right eval Left eval  Hip flexion Decreased due to pain   Hip extension    Hip abduction    Hip adduction    Hip internal rotation    Hip external rotation    Knee flexion    Knee extension    Ankle dorsiflexion    Ankle plantarflexion    Ankle inversion    Ankle eversion     (Blank rows = not tested)  LOWER EXTREMITY MMT:  MMT Right eval Left eval  Hip flexion 4 5  Hip extension    Hip abduction    Hip adduction    Hip internal rotation    Hip external rotation    Knee flexion 4 5  Knee extension 4 5  Ankle dorsiflexion 5 5  Ankle plantarflexion    Ankle inversion    Ankle eversion     (Blank rows = not tested)                                                                                                                                 TREATMENT:  TherEx Per pt request, SciFit multi-peaks level 10 for 8 minutes using BUE/BLEs for neural priming for reciprocal movement, dynamic cardiovascular warmup and increased amplitude of stepping.  To address pt complaints of LBP today: Supine LTR x 10 reps B Supine SKTC x 3 reps B Seated forward fold x 1 rep, very limited in trunk flexion due to muscle tightness in back Seated anterior leans on blue Swiss ball x 4 reps, pt feels "release" of his back tightness Standing lumbar stretch at countertop x 3 reps Feels stretch more in HS vs lumbar region   TherAct To work on balance strategies in // bars with progression away from UE support: Static stance on Bosu 5 x up to 15 sec each no UE support, pt tends to fall posteriorly  To work on functional LE strengthening: Forwards mini-lunges in // bars, 3 x 5 reps  Gait with surge 4 x 115 ft (2 laps to the R, 2 laps to the L) with a seated rest break in  between each lap, no increase in pain with activity    PATIENT EDUCATION:  Education  details: Continue HEP , PT POC with plan to assess goals and d/c next visit Education method: Explanation and Demonstration Education comprehension: verbalized understanding, returned demonstration, and needs further education  HOME EXERCISE PROGRAM: Access Code: [email or web address] URL: [email or web address] Date: 06/13/2023 Prepared by: Alethia Berthold Plaster  Exercises - Side Stepping with Resistance at Thighs and Counter Support  - 1 x daily - 7 x weekly - 3 sets - 10 reps - Forward Backward Monster Walk with Band at [employer] and Counter Support  - 1 x daily - 7 x weekly - 3 sets - 10 reps - Bird Dog  - 1 x daily - 7 x weekly - 2 sets - 10 reps - Squat with Chair Touch  - 1 x daily - 7 x weekly - 2 sets - 10 reps - Modified Thomas Stretch  - 1 x daily - 7 x weekly - 1 sets - 3-5 reps - 30 sec hold  *challenged pt to get up out of bed 3x/day every day (not including bathroom breaks or meals) to work on stamina. Pt in agreement with this (2/6)    ASSESSMENT:  CLINICAL IMPRESSION: Emphasis of skilled PT session on continuing to work on balance strategies and functional strengthening as well as addressing patient complaints of low back pain this date. Pt with some relief of pain following stretches this visit. Pt challenged by activities this visit but with no increase in pain. Plan to assess LTG and d/c next visit. Continue POC.    OBJECTIVE IMPAIRMENTS: Abnormal gait, decreased balance, decreased knowledge of use of DME, decreased mobility, decreased ROM, decreased strength, impaired UE functional use, postural dysfunction, and pain.   ACTIVITY LIMITATIONS: carrying, lifting, bending, standing, stairs, transfers, and bed mobility  PARTICIPATION LIMITATIONS: meal prep, cleaning, driving, and community activity  PERSONAL FACTORS: Age and 1-2 comorbidities:    BPH, bronchiectasis, severe depression,  anxiety and aortic atherosclerosisare also affecting patient's functional outcome.   REHAB POTENTIAL: Good  CLINICAL DECISION MAKING: Stable/uncomplicated  EVALUATION COMPLEXITY: Low   GOALS: Goals reviewed with patient? Yes   LONG TERM GOALS: Target date: 07/19/2023  Pt will be independent with final HEP for improved strength, balance, transfers and gait. Baseline:  Goal status: IN PROGRESS  2.  Pt will improve 5 x STS to less than or equal to 18 seconds to demonstrate improved functional strength and transfer efficiency.  Baseline: 26.18 sec no UE (1/3), 9.4 sec no UE (2/13) Goal status: MET  3.  Pt will improve gait velocity to at least 3.75 ft/sec for improved gait efficiency and performance at mod I level  Baseline: 2.92 ft/sec with rollator (1/3); 3.54 ft/sec w/ rollator (1/28), 3.78 ft/sec no AD (2/13) Goal status: MET  4.  Pt will improve normal TUG to less than or equal to 13 seconds for improved functional mobility and decreased fall risk. Baseline: 22.62 sec with rollator (1/3); 16.09 sec w/ rollator (1/28), 8.47 sec no AD (2/13) Goal status: MET  5.  Pt will improve Berg score to 41/56 for decreased fall risk Baseline: 33/56 (1/3), 53/56 (1/30) Goal status: MET   NEW SHORT TERM GOALS=LONG TERM GOALS due to length of POC  NEW LONG TERM GOALS:  Target date: 08/15/2023  Pt will be independent with final HEP for improved strength, balance, transfers and gait. Baseline:  Goal status: IN PROGRESS  2.  Pt will ambulate greater than or equal to 1300 feet on with no AD and mod I for improved cardiovascular endurance and BLE strength.  Baseline: 967 ft (2/10) Goal status: INITIAL  3.  Pt will improve FGA to 25/30 for decreased fall risk  Baseline: 18/30 (2/10) Goal status: INITIAL    PLAN:  PT FREQUENCY: 2x/week + 1x/week (recert)  PT DURATION: 6 weeks + 4 weeks (recert)  PLANNED INTERVENTIONS: 16109- PT Re-evaluation, 97110-Therapeutic exercises,  97530- Therapeutic activity, 97112- Neuromuscular re-education, 97535- Self Care, [id number]- Manual therapy, [phone]- Gait training, Patient/Family education, Balance training, Stair training, Taping, Dry Needling, Scar mobilization, DME instructions, Cryotherapy, and Moist heat  PLAN FOR NEXT SESSION: assess LTG and d/c  Peter Congo, PT Peter Congo, PT, DPT, CSRS   08/08/2023, 2:44 PM

## 2023-08-10 ENCOUNTER — Other Ambulatory Visit: Payer: Self-pay | Admitting: Physician Assistant

## 2023-08-15 ENCOUNTER — Ambulatory Visit: Payer: Medicare Other | Admitting: Physical Therapy

## 2023-08-15 DIAGNOSIS — R2689 Other abnormalities of gait and mobility: Secondary | ICD-10-CM | POA: Diagnosis not present

## 2023-08-15 DIAGNOSIS — R2681 Unsteadiness on feet: Secondary | ICD-10-CM | POA: Diagnosis not present

## 2023-08-15 DIAGNOSIS — M6281 Muscle weakness (generalized): Secondary | ICD-10-CM

## 2023-08-15 DIAGNOSIS — M25551 Pain in right hip: Secondary | ICD-10-CM

## 2023-08-15 DIAGNOSIS — S72001S Fracture of unspecified part of neck of right femur, sequela: Secondary | ICD-10-CM | POA: Diagnosis not present

## 2023-08-15 NOTE — Therapy (Signed)
 OUTPATIENT PHYSICAL THERAPY LOWER EXTREMITY TREATMENT - DISCHARGE NOTE   Patient Name: Jeremy [name] MRN: [id number] DOB:09/05/1935, 88 y.o., male Today's Date: 08/15/2023  PHYSICAL THERAPY DISCHARGE SUMMARY  Visits from Start of Care: 15  Current functional level related to goals / functional outcomes: Mod I   Remaining deficits: Mildly impaired balance   Education / Equipment: Handout for HEP   Patient agrees to discharge. Patient goals were met. Patient is being discharged due to meeting the stated rehab goals.     END OF SESSION:  PT End of Session - 08/15/23 1402     Visit Number 15    Number of Visits 15   recert   Date for PT Re-Evaluation 08/29/23   recert   Authorization Type Medicare    Progress Note Due on Visit 10    PT Start Time 1400    PT Stop Time 1427   d/c   PT Time Calculation (min) 27 min    Equipment Utilized During Treatment Gait belt    Activity Tolerance Patient tolerated treatment well    Behavior During Therapy Flat affect                       Past Medical History:  Diagnosis Date   Aortic atherosclerosis (HCC)    BPH (benign prostatic hyperplasia)    Bronchiectasis (HCC)    Depression    Depression    GAD (generalized anxiety disorder)    Past Surgical History:  Procedure Laterality Date   APPENDECTOMY     HERNIA REPAIR     TOTAL HIP ARTHROPLASTY Right 05/21/2023   Procedure: RIGHT TOTAL HIP ARTHROPLASTY;  Surgeon: Kathryne Hitch, MD;  Location: [facility] OR;  Service: Orthopedics;  Laterality: Right;   Patient Active Problem List   Diagnosis Date Noted   Malnutrition of moderate degree 05/23/2023   Closed right hip fracture (HCC) 05/23/2023   Closed subcapital fracture of right femur (HCC) 05/21/2023   Closed right hip fracture, initial encounter (HCC) 05/20/2023   Normocytic anemia 05/20/2023   Bronchiectasis (HCC) 05/20/2023   First degree AV block 05/20/2023   Anxiety and depression 05/20/2023   BPH  (benign prostatic hyperplasia) 05/20/2023   Severe depression (HCC) 06/09/2021   Generalized anxiety disorder 06/09/2021   History of psychosis 06/09/2021   Insomnia 09/11/2020    PCP: Irena Reichmann, DO  REFERRING PROVIDER: Charlton Amor, PA-C  REFERRING DIAG: S72.001A (ICD-10-CM) - Fracture of unspecified part of neck of right femur, initial encounter for closed fracture  THERAPY DIAG:  Muscle weakness (generalized)  Unsteadiness on feet  Other abnormalities of gait and mobility  Pain in right hip  Rationale for Evaluation and Treatment: Rehabilitation  ONSET DATE: 05/30/2023 (referral date)  SUBJECTIVE:   SUBJECTIVE STATEMENT: Pt denies any falls or other acute changes since last visit. No noticeable pain.  Wife, [name], stayed in lobby for session   PERTINENT HISTORY: PMH: BPH, bronchiectasis, severe depression, anxiety and aortic atherosclerosis  PAIN:  Are you having pain? Yes: NPRS scale: not rated Pain location: low back (L4/5) Pain description: sore/tight Aggravating factors: stooping Relieving factors: taking it easy  PRECAUTIONS: Fall  RED FLAGS: None   WEIGHT BEARING RESTRICTIONS: Yes WBAT RLE  FALLS:  Has patient fallen in last 6 months? Yes. Number of falls 3 in the past 6 months, feel like legs slip out from under him; usually needs help to get back up  LIVING ENVIRONMENT: Lives with: lives with their spouse  Lives in: House/apartment Stairs: Yes: External: 3-4 steps; has a post to hold onto Has following equipment at home: Dan Humphreys - 4 wheeled, shower chair, and Grab bars  OCCUPATION: retired  PLOF: Independent with gait, Independent with transfers, and Requires assistive device for independence  PATIENT GOALS: "walk independently"  NEXT MD VISIT: sees hip surgeon in 4 weeks (06/30/22)  OBJECTIVE:  Note: Objective measures were completed at Evaluation unless otherwise noted.  DIAGNOSTIC FINDINGS:  R hip xray prior to surgery  05/20/23 FINDINGS: SI joints are non widened. Pubic symphysis and rami appear intact. Acute right femoral neck fracture. No femoral head dislocation   IMPRESSION: Acute right femoral neck fracture.  COGNITION: Overall cognitive status: Difficulty to assess due to: flat affect       POSTURE: rounded shoulders, forward head, and posterior pelvic tilt   LOWER EXTREMITY ROM:  Active ROM Right eval Left eval  Hip flexion Decreased due to pain   Hip extension    Hip abduction    Hip adduction    Hip internal rotation    Hip external rotation    Knee flexion    Knee extension    Ankle dorsiflexion    Ankle plantarflexion    Ankle inversion    Ankle eversion     (Blank rows = not tested)  LOWER EXTREMITY MMT:  MMT Right eval Left eval  Hip flexion 4 5  Hip extension    Hip abduction    Hip adduction    Hip internal rotation    Hip external rotation    Knee flexion 4 5  Knee extension 4 5  Ankle dorsiflexion 5 5  Ankle plantarflexion    Ankle inversion    Ankle eversion     (Blank rows = not tested)                                                                                                                                 TREATMENT:   TherAct For LTG assessment:  [facility] PT Assessment - 08/15/23 1421       6 minute walk test results    Aerobic Endurance Distance Walked 1450    Endurance additional comments 4/10 RPE      Functional Gait  Assessment   Gait assessed  Yes    Gait Level Surface Walks 20 ft in less than 7 sec but greater than 5.5 sec, uses assistive device, slower speed, mild gait deviations, or deviates 6-10 in outside of the 12 in walkway width.    Change in Gait Speed Able to smoothly change walking speed without loss of balance or gait deviation. Deviate no more than 6 in outside of the 12 in walkway width.    Gait with Horizontal Head Turns Performs head turns smoothly with slight change in gait velocity (eg, minor disruption to smooth  gait path), deviates 6-10 in outside 12 in walkway width, or uses an assistive device.  Gait with Vertical Head Turns Performs head turns with no change in gait. Deviates no more than 6 in outside 12 in walkway width.    Gait and Pivot Turn Pivot turns safely within 3 sec and stops quickly with no loss of balance.    Step Over Obstacle Is able to step over 2 stacked shoe boxes taped together (9 in total height) without changing gait speed. No evidence of imbalance.    Gait with Narrow Base of Support Is able to ambulate for 10 steps heel to toe with no staggering.    Gait with Eyes Closed Walks 20 ft, slow speed, abnormal gait pattern, evidence for imbalance, deviates 10-15 in outside 12 in walkway width. Requires more than 9 sec to ambulate 20 ft.    Ambulating Backwards Walks 20 ft, uses assistive device, slower speed, mild gait deviations, deviates 6-10 in outside 12 in walkway width.    Steps Alternating feet, no rail.    Total Score 25    FGA comment: 25/30, low fall risk                PATIENT EDUCATION:  Education details: Continue HEP, results of OM and functional implications, plan to d/c from PT today Education method: Explanation Education comprehension: verbalized understanding  HOME EXERCISE PROGRAM: Access Code: [email or web address] URL: [email or web address] Date: 06/13/2023 Prepared by: Alethia Berthold Plaster  Exercises - Side Stepping with Resistance at Thighs and Counter Support  - 1 x daily - 7 x weekly - 3 sets - 10 reps - Forward Backward Monster Walk with Band at [employer] and Counter Support  - 1 x daily - 7 x weekly - 3 sets - 10 reps - Bird Dog  - 1 x daily - 7 x weekly - 2 sets - 10 reps - Squat with Chair Touch  - 1 x daily - 7 x weekly - 2 sets - 10 reps - Modified Thomas Stretch  - 1 x daily - 7 x weekly - 1 sets - 3-5 reps - 30 sec hold  *challenged pt to get up out of bed 3x/day every day (not including bathroom breaks or meals) to work on stamina. Pt in  agreement with this (2/6)    ASSESSMENT:  CLINICAL IMPRESSION: Emphasis of skilled PT session on assessing LTG in preparation for d/c from OPPT services this date. Pt has met 3/3 LTG due to being independent with his final HEP, improving his endurance by increasing his distance on the , and improving his balance/decreasing his fall risk by improving his score on the FGA. Pt agreeable to d/c from OPPT services this date and continue with his HEP. Pt can return to PT in a few months if warranted.    OBJECTIVE IMPAIRMENTS: Abnormal gait, decreased balance, decreased knowledge of use of DME, decreased mobility, decreased ROM, decreased strength, impaired UE functional use, postural dysfunction, and pain.   ACTIVITY LIMITATIONS: carrying, lifting, bending, standing, stairs, transfers, and bed mobility  PARTICIPATION LIMITATIONS: meal prep, cleaning, driving, and community activity  PERSONAL FACTORS: Age and 1-2 comorbidities:    BPH, bronchiectasis, severe depression, anxiety and aortic atherosclerosisare also affecting patient's functional outcome.   REHAB POTENTIAL: Good  CLINICAL DECISION MAKING: Stable/uncomplicated  EVALUATION COMPLEXITY: Low   GOALS: Goals reviewed with patient? Yes   LONG TERM GOALS: Target date: 07/19/2023  Pt will be independent with final HEP for improved strength, balance, transfers and gait. Baseline:  Goal status: IN PROGRESS  2.  Pt will improve 5 x STS to  less than or equal to 18 seconds to demonstrate improved functional strength and transfer efficiency.  Baseline: 26.18 sec no UE (1/3), 9.4 sec no UE (2/13) Goal status: MET  3.  Pt will improve gait velocity to at least 3.75 ft/sec for improved gait efficiency and performance at mod I level  Baseline: 2.92 ft/sec with rollator (1/3); 3.54 ft/sec w/ rollator (1/28), 3.78 ft/sec no AD (2/13) Goal status: MET  4.  Pt will improve normal TUG to less than or equal to 13 seconds for improved  functional mobility and decreased fall risk. Baseline: 22.62 sec with rollator (1/3); 16.09 sec w/ rollator (1/28), 8.47 sec no AD (2/13) Goal status: MET  5.  Pt will improve Berg score to 41/56 for decreased fall risk Baseline: 33/56 (1/3), 53/56 (1/30) Goal status: MET   NEW SHORT TERM GOALS=LONG TERM GOALS due to length of POC  NEW LONG TERM GOALS:  Target date: 08/15/2023  Pt will be independent with final HEP for improved strength, balance, transfers and gait. Baseline:  Goal status: MET  2.  Pt will ambulate greater than or equal to 1300 feet on with no AD and mod I for improved cardiovascular endurance and BLE strength.  Baseline: 967 ft (2/10), 1450 ft (3/13) Goal status: MET  3.  Pt will improve FGA to 25/30 for decreased fall risk  Baseline: 18/30 (2/10), 25/30 (3/13) Goal status: MET      Peter Congo, PT Peter Congo, PT, DPT, CSRS   08/15/2023, 2:29 PM

## 2023-08-28 ENCOUNTER — Other Ambulatory Visit: Payer: Self-pay | Admitting: Physician Assistant

## 2023-10-15 ENCOUNTER — Ambulatory Visit: Payer: Medicare Other | Admitting: Physician Assistant

## 2023-10-17 ENCOUNTER — Encounter: Payer: Self-pay | Admitting: Physician Assistant

## 2023-10-17 ENCOUNTER — Ambulatory Visit: Admitting: Physician Assistant

## 2023-10-17 VITALS — Wt 123.0 lb

## 2023-10-17 DIAGNOSIS — F339 Major depressive disorder, recurrent, unspecified: Secondary | ICD-10-CM | POA: Diagnosis not present

## 2023-10-17 DIAGNOSIS — F411 Generalized anxiety disorder: Secondary | ICD-10-CM | POA: Diagnosis not present

## 2023-10-17 NOTE — Progress Notes (Signed)
 [facility]  Patient ID: Jeremy [name],  MRN: [id number]  PCP: Pete Brand, DO  Date of Evaluation: 10/17/2023 Time spent:20 minutes  Chief Complaint:  Chief Complaint   Depression; Follow-up    HISTORY/CURRENT STATUS: HPI  For routine med check.    "I'm feeling pretty good today."  Still stays in bed a lot. Doesn't want to get up and do anything.  When about what he does for fun, he said he keeps up with his grandkids, via group text and what sounds like [employer] or a similar program, new pictures are uploaded to a frame and he's able to see when someone posts something new. His 77 yo granddaughter is really smart, and his 86 yo grandson is just being a toddler.  Glade is maintaining his weight, eats a pint of ice cream everyday, and eats more nutritious foods like red beans and rice for example. ADLs and personal hygiene are nl. Not crying easily, no feelings of hopelessness. No SI/HI.  Patient denies increased energy with decreased need for sleep, increased talkativeness, racing thoughts, impulsivity or risky behaviors, increased spending, increased libido, grandiosity, increased irritability or anger, paranoia, or hallucinations.  No recent falls, none since the fractured hip and December.  Denies dizziness, syncope, seizures, numbness, tingling, tremor, tics, slurred speech, confusion.  Denies unexplained weight loss, frequent infections, or sores that heal slowly.  No polyphagia, polydipsia, or polyuria. Denies visual changes or paresthesias.   Individual Medical History/ Review of Systems: Changes? :No      Past medications for mental health diagnoses include: (None listed in old chart) Lithium  he took only for a few weeks for severe depression with suicidal thoughts but did not like it so he stopped it.  cymbalta , mirtazapine , Ativan ,   Was hospitalized many times since 1964, most recent was in 2005.   Allergies: Aspergillus allergy skin test, Aspirin , Sulfa  antibiotics, and Theophyllines  Current Medications:  Current Outpatient Medications:    acetaminophen  (TYLENOL ) 325 MG tablet, Take 1-2 tablets (325-650 mg total) by mouth every 4 (four) hours as needed., Disp: , Rfl:    ARIPiprazole  (ABILIFY ) 2 MG tablet, Take 2 tablets (4 mg total) by mouth daily., Disp: 60 tablet, Rfl: 2   DULoxetine  (CYMBALTA ) 60 MG capsule, TAKE 2 CAPSULES BY MOUTH DAILY, Disp: 180 capsule, Rfl: 0   Fe Fum-Vit C-Vit B12-FA (TRIGELS-F FORTE) CAPS capsule, Take 1 capsule by mouth daily after breakfast., Disp: 30 capsule, Rfl: 0   finasteride  (PROPECIA ) 1 MG tablet, Take 1 mg by mouth daily., Disp: , Rfl:    mirtazapine  (REMERON ) 30 MG tablet, Take 1 tablet (30 mg total) by mouth at bedtime., Disp: 90 tablet, Rfl: 1   tamsulosin  (FLOMAX ) 0.4 MG CAPS capsule, Take 1 capsule (0.4 mg total) by mouth every other day., Disp: , Rfl:    Vitamin D , Ergocalciferol , (DRISDOL) 1.25 MG (50000 UNIT) CAPS capsule, Take by mouth., Disp: , Rfl:    clotrimazole -betamethasone  (LOTRISONE ) cream, APPLY TO AFFECTED AREA TWICE A DAY (Patient not taking: Reported on 06/06/2023), Disp: 30 g, Rfl: 0   methocarbamol  (ROBAXIN ) 500 MG tablet, Take 1 tablet (500 mg total) by mouth every 6 (six) hours as needed for muscle spasms. (Patient not taking: Reported on 10/17/2023), Disp: 30 tablet, Rfl: 0   Oxycodone  HCl 10 MG TABS, Take 1 tablet (10 mg total) by mouth 2 (two) times daily. (Patient not taking: Reported on 06/06/2023), Disp: 14 tablet, Rfl: 0   senna (SENOKOT) 8.6 MG TABS tablet, Take  1 tablet (8.6 mg total) by mouth daily. (Patient not taking: Reported on 06/07/2023), Disp: , Rfl:  Medication Side Effects: States that doubling the Abilify  caused "psychotic dreams."  He was not sure whether he was awake or asleep.  They went away after a few weeks.  Family Medical/ Social History: Changes? No  MENTAL HEALTH EXAM:  Weight 123 lb (55.8 kg).Body mass index is 19.85 kg/m.     General Appearance: Casual,  Well Groomed, and thin, arthritic changes of his hands bilaterally  Eye Contact:  Good  Speech:  Clear and Coherent and Normal Rate  Volume:  Decreased  Mood:  Euthymic  Affect:  Congruent and smiles a few times  Thought Process:  Goal Directed and Descriptions of Associations: Circumstantial  Orientation:  Full (Time, Place, and Person)  Thought Content: Logical   Suicidal Thoughts:  No  Homicidal Thoughts:  No  Memory:  WNL  Judgement:  Good  Insight:  Good  Psychomotor Activity:  walks slow and with caution but able to sit and stand without assistance  Concentration:  Concentration: Good and Attention Span: Good  Recall:  Good  Fund of Knowledge: Good  Language: Good  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 Recurrent major depression resistant to treatment (HCC); with hx psychosis  F33.9     2. Generalized anxiety disorder  F41.1       Receiving Psychotherapy: Yes Dr. Jonelle Neri Mitchum   RECOMMENDATIONS:  PDMP was reviewed.  Given 14 oxycodone  on 06/03/2023. I provided 20 minutes of face to face time during this encounter, including time spent before and after the visit in records review, medical decision making, counseling pertinent to today's visit, and charting.   He is doing much better than even 3 months ago, more engaging in conversation and talking about his grandkids which he's never done that I recall. He's maintaining weight which is great! His stamina and strength were really helped by PT after hip replacement in Dec, and he's keeping up with some exercises, despite staying in bed a lot. I encouraged him to get up more, even if to go to the living room to sit. He's responding well to the current treatment so no changes will be made.   Continue Abilify  2 mg,  2 every day. Continue Cymbalta  60 mg, 2 p.o. daily. Continue mirtazapine  30 mg, 1  p.o. nightly. Recommend multivitamin, vitamin D , B complex, and fish oil. Get back in therapy with Dr. Delora Ferry. Pt stated he didn't feel like he needed to but would like to check in with him.  Return in 2-3 months (see me every other month and Dr. Caroleen Churn each month in between if possible.)  Marvia Slocumb, PA-C

## 2023-11-10 ENCOUNTER — Other Ambulatory Visit: Payer: Self-pay | Admitting: Physician Assistant

## 2023-11-25 ENCOUNTER — Other Ambulatory Visit: Payer: Self-pay | Admitting: Physician Assistant

## 2023-12-20 ENCOUNTER — Ambulatory Visit: Admitting: Psychiatry

## 2023-12-30 ENCOUNTER — Ambulatory Visit (INDEPENDENT_AMBULATORY_CARE_PROVIDER_SITE_OTHER): Payer: Medicare Other | Admitting: Orthopaedic Surgery

## 2023-12-30 ENCOUNTER — Encounter: Payer: Self-pay | Admitting: Orthopaedic Surgery

## 2023-12-30 ENCOUNTER — Other Ambulatory Visit (INDEPENDENT_AMBULATORY_CARE_PROVIDER_SITE_OTHER): Payer: Self-pay

## 2023-12-30 ENCOUNTER — Other Ambulatory Visit: Payer: Self-pay | Admitting: Physician Assistant

## 2023-12-30 DIAGNOSIS — Z96641 Presence of right artificial hip joint: Secondary | ICD-10-CM

## 2023-12-30 NOTE — Progress Notes (Signed)
 The patient is an 88 year old gentleman who is now 7 months status post a right total hip arthroplasty to treat an acute right hip displaced femoral neck fracture.  That fracture occurred after a hard mechanical fall.  He says he is doing well.  He is not walking with any assistive device.  He reports improved range of motion and strength.  On exam his right operative hip moves smoothly and fluidly.  His leg lengths are equal.  His left native hip also moves smoothly and fluidly.  Standing AP pelvis shows a well-seated right total hip arthroplasty with no complicating features.  At this point follow-up can be as needed for his right hip.  If he does develop any issues they need to reach out and let us  know.

## 2024-01-09 ENCOUNTER — Encounter: Payer: Self-pay | Admitting: Physician Assistant

## 2024-01-09 ENCOUNTER — Ambulatory Visit: Admitting: Physician Assistant

## 2024-01-09 VITALS — Wt 130.0 lb

## 2024-01-09 DIAGNOSIS — F3341 Major depressive disorder, recurrent, in partial remission: Secondary | ICD-10-CM

## 2024-01-09 DIAGNOSIS — G4700 Insomnia, unspecified: Secondary | ICD-10-CM | POA: Diagnosis not present

## 2024-01-09 DIAGNOSIS — F411 Generalized anxiety disorder: Secondary | ICD-10-CM

## 2024-01-09 NOTE — Progress Notes (Signed)
 [facility]  Patient ID: JOELLE FLESSNER,  MRN: [id number]  PCP: Gerome Brunet, DO  Date of Evaluation: 01/09/2024 Time spent:20 minutes  Chief Complaint:  Chief Complaint   Depression; Follow-up    HISTORY/CURRENT STATUS: HPI  For routine 3 month med check.    Euriah says he's about the same. Still stays in bed a lot but 'pictures of the grandkids' get him up. He has a digital frame that his kids send pics directly to, so he keeps up with them through that. He's eating better and has gained some weight. Still eats ice cream daily. Doesn't do much for enjoyment. Sleeps well.  Feels like the current meds are effective.  No mania, delirium, psychosis, SI or HI.   Tremor in hands are the same.  Denies dizziness, syncope, seizures, numbness, tingling, tics, unsteady gait, slurred speech, confusion. Stiffness and discomfort in hands d/t arthritis.  Denies unexplained weight loss, frequent infections, or sores that heal slowly.  No polyphagia, polydipsia, or polyuria. Denies visual changes or paresthesias.   Individual Medical History/ Review of Systems: Changes? :No      Past medications for mental health diagnoses include: (None listed in old chart) Lithium  he took only for a few weeks for severe depression with suicidal thoughts but did not like it so he stopped it.  cymbalta , mirtazapine , Ativan ,   Was hospitalized many times since 1964, most recent was in 2005.   Allergies: Aspergillus allergy skin test, Aspirin , Sulfa antibiotics, and Theophyllines  Current Medications:  Current Outpatient Medications:    acetaminophen  (TYLENOL ) 325 MG tablet, Take 1-2 tablets (325-650 mg total) by mouth every 4 (four) hours as needed., Disp: , Rfl:    ARIPiprazole  (ABILIFY ) 2 MG tablet, TAKE 2 TABLETS BY MOUTH DAILY., Disp: 180 tablet, Rfl: 0   DULoxetine  (CYMBALTA ) 60 MG capsule, TAKE 2 CAPSULES BY MOUTH DAILY, Disp: 180 capsule, Rfl: 0   Fe Fum-Vit C-Vit B12-FA (TRIGELS-F FORTE) CAPS  capsule, Take 1 capsule by mouth daily after breakfast., Disp: 30 capsule, Rfl: 0   finasteride  (PROPECIA ) 1 MG tablet, Take 1 mg by mouth daily., Disp: , Rfl:    mirtazapine  (REMERON ) 30 MG tablet, TAKE 1 TABLET BY MOUTH AT BEDTIME., Disp: 90 tablet, Rfl: 1   tamsulosin  (FLOMAX ) 0.4 MG CAPS capsule, Take 1 capsule (0.4 mg total) by mouth every other day., Disp: , Rfl:    Vitamin D , Ergocalciferol , (DRISDOL) 1.25 MG (50000 UNIT) CAPS capsule, Take by mouth., Disp: , Rfl:    clotrimazole -betamethasone  (LOTRISONE ) cream, APPLY TO AFFECTED AREA TWICE A DAY (Patient not taking: Reported on 06/06/2023), Disp: 30 g, Rfl: 0   methocarbamol  (ROBAXIN ) 500 MG tablet, Take 1 tablet (500 mg total) by mouth every 6 (six) hours as needed for muscle spasms. (Patient not taking: Reported on 10/17/2023), Disp: 30 tablet, Rfl: 0   Oxycodone  HCl 10 MG TABS, Take 1 tablet (10 mg total) by mouth 2 (two) times daily. (Patient not taking: Reported on 06/06/2023), Disp: 14 tablet, Rfl: 0   senna (SENOKOT) 8.6 MG TABS tablet, Take 1 tablet (8.6 mg total) by mouth daily. (Patient not taking: Reported on 06/07/2023), Disp: , Rfl:  Medication Side Effects: States that doubling the Abilify  caused psychotic dreams.  He was not sure whether he was awake or asleep.  They went away after a few weeks.  Family Medical/ Social History: Changes? No  MENTAL HEALTH EXAM:  Weight 130 lb (59 kg).Body mass index is 20.98 kg/m.     General  Appearance: Casual, Well Groomed, and arthritic changes in hand bilat  Eye Contact:  Good  Speech:  Clear and Coherent and Normal Rate  Volume:  Decreased  Mood:  Euthymic  Affect:  Congruent and smiles a few times and laughed some  Thought Process:  Goal Directed and Descriptions of Associations: Circumstantial  Orientation:  Full (Time, Place, and Person)  Thought Content: Logical   Suicidal Thoughts:  No  Homicidal Thoughts:  No  Memory:  WNL  Judgement:  Good  Insight:  Good  Psychomotor  Activity:  Tremor and walks slow and with caution but able to sit and stand without assistance  Concentration:  Concentration: Good and Attention Span: Good  Recall:  Good  Fund of Knowledge: Good  Language: Good  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 Recurrent major depression in partial remission (HCC)  F33.41     2. Generalized anxiety disorder  F41.1     3. Insomnia, unspecified type  G47.00      Receiving Psychotherapy: No Dr. Jodie Mitchum in the past  RECOMMENDATIONS:  PDMP was reviewed.  Given 14 oxycodone  on 06/03/2023. I provided approximately 20 minutes of face to face time during this encounter, including time spent before and after the visit in records review, medical decision making, counseling pertinent to today's visit, and charting.   Xaden has gained 7# in 3 months!  We have a new scale in the office so it might be off by a pound or two, but that's still great!  I'm proud of him for eating better.    As far as our meds are concerned, he is stable so no changes will be made.   Continue Abilify  2 mg,  2 every day. Continue Cymbalta  60 mg, 2 p.o. daily. Continue mirtazapine  30 mg, 1 p.o. nightly. Recommend multivitamin, vitamin D , B complex, and fish oil. Return in 3 months.   Verneita Cooks, PA-C

## 2024-01-16 ENCOUNTER — Ambulatory Visit: Admitting: Physician Assistant

## 2024-02-10 ENCOUNTER — Other Ambulatory Visit: Payer: Self-pay | Admitting: Physician Assistant

## 2024-02-20 ENCOUNTER — Other Ambulatory Visit: Payer: Self-pay | Admitting: Physician Assistant

## 2024-03-17 DIAGNOSIS — Z23 Encounter for immunization: Secondary | ICD-10-CM | POA: Diagnosis not present

## 2024-04-06 ENCOUNTER — Encounter: Payer: Self-pay | Admitting: Radiology

## 2024-04-07 ENCOUNTER — Ambulatory Visit (INDEPENDENT_AMBULATORY_CARE_PROVIDER_SITE_OTHER): Admitting: Physician Assistant

## 2024-04-07 ENCOUNTER — Encounter: Payer: Self-pay | Admitting: Physician Assistant

## 2024-04-07 VITALS — Wt 138.4 lb

## 2024-04-07 DIAGNOSIS — G4700 Insomnia, unspecified: Secondary | ICD-10-CM | POA: Diagnosis not present

## 2024-04-07 DIAGNOSIS — F339 Major depressive disorder, recurrent, unspecified: Secondary | ICD-10-CM | POA: Diagnosis not present

## 2024-04-07 DIAGNOSIS — F411 Generalized anxiety disorder: Secondary | ICD-10-CM

## 2024-04-07 MED ORDER — ARIPIPRAZOLE 2 MG PO TABS: 4.0000 mg | ORAL_TABLET | Freq: Every day | ORAL | 1 refills | Status: AC

## 2024-04-07 MED ORDER — DULOXETINE HCL 60 MG PO CPEP: 120.0000 mg | ORAL_CAPSULE | Freq: Every day | ORAL | 1 refills | Status: AC

## 2024-04-07 MED ORDER — MIRTAZAPINE 30 MG PO TABS: 30.0000 mg | ORAL_TABLET | Freq: Every day | ORAL | 1 refills | Status: AC

## 2024-04-07 NOTE — Progress Notes (Signed)
 [facility]  Patient ID: Jeremy [name],  MRN: [id number]  PCP: Gerome Brunet, DO  Date of Evaluation: 04/07/2024 Time spent:20 minutes  Chief Complaint:  Chief Complaint   Depression; Insomnia; Follow-up    HISTORY/CURRENT STATUS: HPI  For routine 3 month med check.    Jeremy [name] states he is about the same.  He stays in bed a lot.  That has not changed in 2 or 3 years.  He does not really want to do anything.  States he reflects on his life.  He has been eating pretty well and gaining some weight.  He eats ice cream every day.  He tells about his 37-year-old granddaughter visiting recently.  He read to her which was a joy.  Personal hygiene is normal.  He does not cry easily.  No changes in memory.  He rarely has anxiety.  No mania, delirium, AH/VH.  No SI/HI.  Individual Medical History/ Review of Systems: Changes? :No      Past medications for mental health diagnoses include: (None listed in old chart) Lithium  he took only for a few weeks for severe depression with suicidal thoughts but did not like it so he stopped it.  cymbalta , mirtazapine , Ativan ,   Was hospitalized many times since 1964, most recent was in 2005.   Allergies: Aspergillus allergy skin test, Aspirin , Sulfa antibiotics, and Theophyllines  Current Medications:  Current Outpatient Medications:    acetaminophen  (TYLENOL ) 325 MG tablet, Take 1-2 tablets (325-650 mg total) by mouth every 4 (four) hours as needed., Disp: , Rfl:    finasteride  (PROPECIA ) 1 MG tablet, Take 1 mg by mouth daily., Disp: , Rfl:    tamsulosin  (FLOMAX ) 0.4 MG CAPS capsule, Take 1 capsule (0.4 mg total) by mouth every other day., Disp: , Rfl:    Vitamin D , Ergocalciferol , (DRISDOL) 1.25 MG (50000 UNIT) CAPS capsule, Take by mouth., Disp: , Rfl:    ARIPiprazole  (ABILIFY ) 2 MG tablet, Take 2 tablets (4 mg total) by mouth daily., Disp: 180 tablet, Rfl: 1   clotrimazole -betamethasone  (LOTRISONE ) cream, APPLY TO AFFECTED AREA TWICE A DAY  (Patient not taking: Reported on 06/06/2023), Disp: 30 g, Rfl: 0   DULoxetine  (CYMBALTA ) 60 MG capsule, Take 2 capsules (120 mg total) by mouth daily., Disp: 180 capsule, Rfl: 1   Fe Fum-Vit C-Vit B12-FA (TRIGELS-F FORTE) CAPS capsule, Take 1 capsule by mouth daily after breakfast., Disp: 30 capsule, Rfl: 0   methocarbamol  (ROBAXIN ) 500 MG tablet, Take 1 tablet (500 mg total) by mouth every 6 (six) hours as needed for muscle spasms. (Patient not taking: Reported on 10/17/2023), Disp: 30 tablet, Rfl: 0   mirtazapine  (REMERON ) 30 MG tablet, Take 1 tablet (30 mg total) by mouth at bedtime., Disp: 90 tablet, Rfl: 1   Oxycodone  HCl 10 MG TABS, Take 1 tablet (10 mg total) by mouth 2 (two) times daily. (Patient not taking: Reported on 06/06/2023), Disp: 14 tablet, Rfl: 0   senna (SENOKOT) 8.6 MG TABS tablet, Take 1 tablet (8.6 mg total) by mouth daily. (Patient not taking: Reported on 06/07/2023), Disp: , Rfl:  Medication Side Effects: States that doubling the Abilify  caused psychotic dreams.  He was not sure whether he was awake or asleep.  They went away after a few weeks.  Family Medical/ Social History: Changes? No  MENTAL HEALTH EXAM:  Weight 138 lb 6.4 oz (62.8 kg).Body mass index is 22.34 kg/m.     General Appearance: Casual and Well Groomed  Eye Contact:  Good  Speech:  Clear and Coherent and Normal Rate  Volume:  Decreased  Mood:  Euthymic  Affect:  Congruent and and smiles a few times, esp when talking about reading to his 51 yo granddaughter  Thought Process:  Goal Directed and Descriptions of Associations: Circumstantial  Orientation:  Full (Time, Place, and Person)  Thought Content: Logical   Suicidal Thoughts:  No  Homicidal Thoughts:  No  Memory:  WNL  Judgement:  Good  Insight:  Good  Psychomotor Activity:  Tremor  Concentration:  Concentration: Good and Attention Span: Good  Recall:  Good  Fund of Knowledge: Good  Language: Good  Assets:  Engineering Geologist  ADL's:  Intact  Cognition: WNL  Prognosis:  Fair   DIAGNOSES:    ICD-10-CM   1. Recurrent major depression resistant to treatment (HCC); with hx psychosis  F33.9     2. Generalized anxiety disorder  F41.1     3. Insomnia, unspecified type  G47.00       Receiving Psychotherapy: No Dr. Jodie Mitchum in the past  RECOMMENDATIONS:  PDMP was reviewed.  Given 14 oxycodone  on 06/03/2023. I provided approximately  20 minutes of face to face time during this encounter, including time spent before and after the visit in records review, medical decision making, counseling pertinent to today's visit, and charting.   Jeremy [name] has gained weight which is good.  He is in a normal range for his height now.  His mood seems better to me at least overall.  Encouraged him again to get out of bed even if it is to go sit on the couch.  States he does not want to.  He is stable as far as the medications goes so no changes will be made.  Continue Abilify  2 mg,  2 every day. Continue Cymbalta  60 mg, 2 p.o. daily. Continue mirtazapine  30 mg, 1 p.o. nightly. Recommend multivitamin, vitamin D , B complex, and fish oil. Return in 3  months.   Verneita Cooks, PA-C

## 2024-07-07 ENCOUNTER — Ambulatory Visit: Admitting: Physician Assistant

## 2024-08-03 ENCOUNTER — Ambulatory Visit: Admitting: Physician Assistant
